# Patient Record
Sex: Female | Born: 1935 | Race: White | Hispanic: No | Smoking: Never smoker
Health system: Southern US, Community
[De-identification: ages and names within clinical notes are randomized; demographics above are authoritative.]

## PROBLEM LIST (undated history)

## (undated) DIAGNOSIS — E559 Vitamin D deficiency, unspecified: Secondary | ICD-10-CM

## (undated) DIAGNOSIS — E039 Hypothyroidism, unspecified: Secondary | ICD-10-CM

## (undated) DIAGNOSIS — L309 Dermatitis, unspecified: Secondary | ICD-10-CM

## (undated) HISTORY — DX: Hypothyroidism, unspecified: E03.9

## (undated) HISTORY — PX: TONSILLECTOMY: SUR1361

---

## 2008-08-20 ENCOUNTER — Other Ambulatory Visit: Admission: RE | Admit: 2008-08-20 | Discharge: 2008-08-20 | Payer: Self-pay | Admitting: Family Medicine

## 2011-02-23 ENCOUNTER — Encounter: Payer: Self-pay | Admitting: Internal Medicine

## 2012-04-03 DIAGNOSIS — Z136 Encounter for screening for cardiovascular disorders: Secondary | ICD-10-CM | POA: Diagnosis not present

## 2012-04-03 DIAGNOSIS — Z0000 Encounter for general adult medical examination without abnormal findings: Secondary | ICD-10-CM | POA: Diagnosis not present

## 2012-04-03 DIAGNOSIS — Z9181 History of falling: Secondary | ICD-10-CM | POA: Diagnosis not present

## 2012-04-03 DIAGNOSIS — E039 Hypothyroidism, unspecified: Secondary | ICD-10-CM | POA: Diagnosis not present

## 2012-05-15 DIAGNOSIS — H02409 Unspecified ptosis of unspecified eyelid: Secondary | ICD-10-CM | POA: Diagnosis not present

## 2012-05-15 DIAGNOSIS — Z961 Presence of intraocular lens: Secondary | ICD-10-CM | POA: Diagnosis not present

## 2012-05-15 DIAGNOSIS — H0230 Blepharochalasis unspecified eye, unspecified eyelid: Secondary | ICD-10-CM | POA: Diagnosis not present

## 2012-05-15 DIAGNOSIS — H53039 Strabismic amblyopia, unspecified eye: Secondary | ICD-10-CM | POA: Diagnosis not present

## 2012-07-20 DIAGNOSIS — Z23 Encounter for immunization: Secondary | ICD-10-CM | POA: Diagnosis not present

## 2012-10-07 DIAGNOSIS — L259 Unspecified contact dermatitis, unspecified cause: Secondary | ICD-10-CM | POA: Diagnosis not present

## 2012-10-07 DIAGNOSIS — E039 Hypothyroidism, unspecified: Secondary | ICD-10-CM | POA: Diagnosis not present

## 2013-04-07 DIAGNOSIS — N951 Menopausal and female climacteric states: Secondary | ICD-10-CM | POA: Diagnosis not present

## 2013-04-07 DIAGNOSIS — Z0000 Encounter for general adult medical examination without abnormal findings: Secondary | ICD-10-CM | POA: Diagnosis not present

## 2013-04-07 DIAGNOSIS — E039 Hypothyroidism, unspecified: Secondary | ICD-10-CM | POA: Diagnosis not present

## 2013-04-07 DIAGNOSIS — Z136 Encounter for screening for cardiovascular disorders: Secondary | ICD-10-CM | POA: Diagnosis not present

## 2013-04-07 DIAGNOSIS — E559 Vitamin D deficiency, unspecified: Secondary | ICD-10-CM | POA: Diagnosis not present

## 2013-04-30 DIAGNOSIS — Z1231 Encounter for screening mammogram for malignant neoplasm of breast: Secondary | ICD-10-CM | POA: Diagnosis not present

## 2013-04-30 DIAGNOSIS — M899 Disorder of bone, unspecified: Secondary | ICD-10-CM | POA: Diagnosis not present

## 2013-06-20 ENCOUNTER — Emergency Department (HOSPITAL_BASED_OUTPATIENT_CLINIC_OR_DEPARTMENT_OTHER)
Admission: EM | Admit: 2013-06-20 | Discharge: 2013-06-20 | Disposition: A | Discharge status: Home / Self Care | Payer: Medicare Other | Attending: Emergency Medicine | Admitting: Emergency Medicine

## 2013-06-20 ENCOUNTER — Emergency Department (HOSPITAL_BASED_OUTPATIENT_CLINIC_OR_DEPARTMENT_OTHER): Payer: Medicare Other

## 2013-06-20 ENCOUNTER — Encounter (HOSPITAL_BASED_OUTPATIENT_CLINIC_OR_DEPARTMENT_OTHER): Payer: Self-pay | Admitting: Emergency Medicine

## 2013-06-20 DIAGNOSIS — Z8639 Personal history of other endocrine, nutritional and metabolic disease: Secondary | ICD-10-CM | POA: Insufficient documentation

## 2013-06-20 DIAGNOSIS — Z872 Personal history of diseases of the skin and subcutaneous tissue: Secondary | ICD-10-CM | POA: Diagnosis not present

## 2013-06-20 DIAGNOSIS — R1033 Periumbilical pain: Secondary | ICD-10-CM | POA: Diagnosis not present

## 2013-06-20 DIAGNOSIS — R109 Unspecified abdominal pain: Secondary | ICD-10-CM | POA: Diagnosis not present

## 2013-06-20 DIAGNOSIS — Z862 Personal history of diseases of the blood and blood-forming organs and certain disorders involving the immune mechanism: Secondary | ICD-10-CM | POA: Insufficient documentation

## 2013-06-20 HISTORY — DX: Vitamin D deficiency, unspecified: E55.9

## 2013-06-20 HISTORY — DX: Hypothyroidism, unspecified: E03.9

## 2013-06-20 HISTORY — DX: Dermatitis, unspecified: L30.9

## 2013-06-20 LAB — CBC WITH DIFFERENTIAL/PLATELET
Basophils Absolute: 0 10*3/uL (ref 0.0–0.1)
Basophils Relative: 0 % (ref 0–1)
Eosinophils Absolute: 0 10*3/uL (ref 0.0–0.7)
HCT: 46.6 % — ABNORMAL HIGH (ref 36.0–46.0)
Hemoglobin: 15.8 g/dL — ABNORMAL HIGH (ref 12.0–15.0)
Lymphocytes Relative: 7 % — ABNORMAL LOW (ref 12–46)
MCH: 32.7 pg (ref 26.0–34.0)
Monocytes Absolute: 0.7 10*3/uL (ref 0.1–1.0)
Neutro Abs: 12.9 10*3/uL — ABNORMAL HIGH (ref 1.7–7.7)
RDW: 13 % (ref 11.5–15.5)
WBC: 14.6 10*3/uL — ABNORMAL HIGH (ref 4.0–10.5)

## 2013-06-20 LAB — URINALYSIS, ROUTINE W REFLEX MICROSCOPIC
Bilirubin Urine: NEGATIVE
Glucose, UA: 100 mg/dL — AB
Hgb urine dipstick: NEGATIVE
Ketones, ur: 15 mg/dL — AB
Nitrite: NEGATIVE
Protein, ur: NEGATIVE mg/dL

## 2013-06-20 LAB — COMPREHENSIVE METABOLIC PANEL
ALT: 11 U/L (ref 0–35)
AST: 19 U/L (ref 0–37)
Alkaline Phosphatase: 70 U/L (ref 39–117)
CO2: 24 mEq/L (ref 19–32)
Chloride: 98 mEq/L (ref 96–112)
Creatinine, Ser: 0.8 mg/dL (ref 0.50–1.10)
GFR calc non Af Amer: 65 mL/min — ABNORMAL LOW (ref >90)
Total Bilirubin: 0.7 mg/dL (ref 0.3–1.2)
Total Protein: 7.7 g/dL (ref 6.0–8.3)

## 2013-06-20 LAB — URINE MICROSCOPIC-ADD ON

## 2013-06-20 MED ORDER — IOHEXOL 300 MG/ML  SOLN
100.0000 mL | Freq: Once | INTRAMUSCULAR | Status: AC | PRN
  Administered 2013-06-20: 100 mL via INTRAVENOUS

## 2013-06-20 MED ORDER — IOHEXOL 300 MG/ML  SOLN
50.0000 mL | Freq: Once | INTRAMUSCULAR | Status: AC | PRN
  Administered 2013-06-20: 50 mL via ORAL

## 2013-06-20 MED ORDER — HYDROCODONE-ACETAMINOPHEN 5-325 MG PO TABS: 1.0000 | ORAL_TABLET | ORAL | Status: DC | PRN

## 2013-06-20 MED ORDER — SODIUM CHLORIDE 0.9 % IV BOLUS (SEPSIS)
500.0000 mL | Freq: Once | INTRAVENOUS | Status: AC
  Administered 2013-06-20: 500 mL via INTRAVENOUS

## 2013-06-20 NOTE — ED Notes (Addendum)
Patient states that she started having abdominal cramps around 11am this morning.  Patient is from [facility].  Patient was sent to [facility] and took X-rays and medications.  Patient was then sent here for CT scan of abdomen.  Patient was nauseated and vomited today.  Patient was given 60mg  of Toradol and 25mg  phenergan IM PTA

## 2013-06-20 NOTE — ED Notes (Signed)
Patient transported to CT via stretcher per tech. 

## 2013-06-20 NOTE — ED Provider Notes (Signed)
CSN: [id number]     Arrival date & time 06/20/13  1707 History   First MD Initiated Contact with Patient 06/20/13 1750    Scribed for Geoffery Lyons, MD, the patient was seen in room MH04/MH04. This chart was scribed by Lewanda Rife, ED scribe. Patient's care was started at 6:06 PM   Chief Complaint  Patient presents with  . Abdominal Pain   (Consider location/radiation/quality/duration/timing/severity/associated sxs/prior Treatment) The history is provided by the patient and medical records. No language interpreter was used.   HPI Comments: Lisa Barrera is a 79 y.o. female who presents to the Emergency Department complaining of constant moderate central abdominal pain onset this morning. Describes pain as focal. Reports associated emesis. Denies any aggravating or alleviating factors. Denies associated fever, hematemesis, diarrhea, dysuria, burning with urination, and decreased appetite. Reports last meal was this morning. Denies hx of the same. Denies abdominal surgical hx. Denies other pertinent PMHx.    Past Medical History  Diagnosis Date  . Hypothyroid   . Eczema   . Vitamin D deficiency    Past Surgical History  Procedure Laterality Date  . Tonsillectomy     History reviewed. No pertinent family history. History  Substance Use Topics  . Smoking status: Never Smoker   . Smokeless tobacco: Not on file  . Alcohol Use: Yes   OB History   Grav Para Term Preterm Abortions TAB SAB Ect Mult Living                 Review of Systems  Constitutional: Negative for fever.  Gastrointestinal: Positive for vomiting and abdominal pain. Negative for diarrhea.  All other systems reviewed and are negative.   A complete 10 system review of systems was obtained and all systems are negative except as noted in the HPI and PMHx.    Allergies  Review of patient's allergies indicates no known allergies.  Home Medications  No current outpatient prescriptions on file. BP 179/81   Pulse 58  Temp(Src) 97.7 F (36.5 C) (Oral)  Resp 20  Ht 5\' 2"  (1.575 m)  Wt 150 lb (68.04 kg)  BMI 27.43 kg/m2  SpO2 96% Physical Exam  Nursing note and vitals reviewed. Constitutional: She is oriented to person, place, and time. She appears well-developed and well-nourished. No distress.  HENT:  Head: Normocephalic and atraumatic.  Mouth/Throat: Oropharynx is clear and moist. No oropharyngeal exudate.  Eyes: Conjunctivae and EOM are normal. No scleral icterus.  Neck: Neck supple. No tracheal deviation present.  Cardiovascular: Normal rate and regular rhythm.   No murmur heard. Pulmonary/Chest: Effort normal and breath sounds normal. No respiratory distress.  Abdominal: Soft. Bowel sounds are normal. There is tenderness. There is no rebound and no guarding.  TTP below umbilicus with no rebound or guarding   Musculoskeletal: Normal range of motion.  Neurological: She is alert and oriented to person, place, and time.  Skin: Skin is warm and dry.  Psychiatric: She has a normal mood and affect. Her behavior is normal.    ED Course  Procedures (including critical care time) DIAGNOSTIC STUDIES: Oxygen Saturation is 96% on room air, normal by my interpretation.    COORDINATION OF CARE:  Nursing notes reviewed. Vital signs reviewed. Initial pt interview and examination performed.  Treatment plan initiated: Medications  iohexol (OMNIPAQUE) 300 MG/ML solution 50 mL (not administered)  sodium chloride 0.9 % bolus 500 mL (500 mLs Intravenous New Bag/Given 06/20/13 1758)    6:10 PM-Discussed treatment plan with pt at bedside, which  includes CT of abdomen, CBC with diff panel, CMP, Lipase, and UA . Pt agrees with plan.  Initial diagnostic testing ordered.    8:19 PM Nursing Notes Reviewed/ Care Coordinated Applicable Imaging Reviewed  Interpretation of Laboratory Data incorporated into ED treatment Discussed results and treatment plan with pt. Pt demonstrates understanding and  agrees with plan.   Labs Review Labs Reviewed  CBC WITH DIFFERENTIAL - Abnormal; Notable for the following:    WBC 14.6 (*)    Hemoglobin 15.8 (*)    HCT 46.6 (*)    Neutrophils Relative % 89 (*)    Neutro Abs 12.9 (*)    Lymphocytes Relative 7 (*)    All other components within normal limits  COMPREHENSIVE METABOLIC PANEL - Abnormal; Notable for the following:    Glucose, Bld 188 (*)    GFR calc non Af Amer 65 (*)    GFR calc Af Amer 76 (*)    All other components within normal limits  URINALYSIS, ROUTINE W REFLEX MICROSCOPIC - Abnormal; Notable for the following:    Specific Gravity, Urine 1.034 (*)    Glucose, UA 100 (*)    Ketones, ur 15 (*)    Leukocytes, UA SMALL (*)    All other components within normal limits  LIPASE, BLOOD  URINE MICROSCOPIC-ADD ON   Imaging Review Ct Abdomen Pelvis W Contrast  06/20/2013   CLINICAL DATA:  Abdominal pain.  EXAM: CT ABDOMEN AND PELVIS WITH CONTRAST  TECHNIQUE: Multidetector CT imaging of the abdomen and pelvis was performed using the standard protocol following bolus administration of intravenous contrast.  CONTRAST:  OMNIPAQUE IOHEXOL 300 MG/ML SOLN, 50mL OMNIPAQUE IOHEXOL 300 MG/ML SOLN  COMPARISON:  None.  FINDINGS: Visualized lung bases appear normal. The liver, spleen and pancreas appear normal. No gallstones are noted. Adrenal glands appear normal. Kidneys demonstrate a horseshoe configuration without hydronephrosis or renal obstruction. No evidence of bowel obstruction is noted. Urinary bladder appears normal. Uterus appears normal. No abnormal fluid collection is noted.  IMPRESSION: No acute abnormality seen in the abdomen or pelvis.   Electronically Signed   By: Roque Lias M.D.   On: 06/20/2013 19:41    EKG Interpretation   None       MDM  No diagnosis found. Patient is an 79 year old female with extremely little medical history and no surgical history. She presents with complaints of abdominal pain is located  primarily just inferior to the umbilicus. She has had this cramping off and on for the past couple of days. She has had no fevers and no chills. She was seen by her primary Dr. and was given a pain shot and sent here for further workup. Laboratory studies reveal a white count of 14.5 but are otherwise unremarkable. CT scan was obtained which reveals no acute intra-abdominal process. There is no evidence for appendicitis, cholecystitis, bowel obstruction or other acutely surgical pathology. At this point I feel as though she is stable for discharge. I will prescribe a small number of pain pills which he can take if her pain returns. She was advised to return for severe abdominal pain, high fever, bloody stool, or she develops other new or bothersome symptoms that she would like rechecked. I personally performed the services described in this documentation, which was scribed in my presence. The recorded information has been reviewed and is accurate.       Geoffery Lyons, MD 06/20/13 2023

## 2013-06-20 NOTE — ED Notes (Signed)
Pt and family updated.  Pt ambulated to bathroom.  Denies nausea or significant pain.

## 2013-06-23 DIAGNOSIS — R109 Unspecified abdominal pain: Secondary | ICD-10-CM | POA: Diagnosis not present

## 2013-06-24 ENCOUNTER — Encounter (HOSPITAL_COMMUNITY): Payer: Medicare Other | Admitting: Anesthesiology

## 2013-06-24 ENCOUNTER — Inpatient Hospital Stay (HOSPITAL_COMMUNITY): Payer: Medicare Other | Admitting: Anesthesiology

## 2013-06-24 ENCOUNTER — Emergency Department (HOSPITAL_COMMUNITY): Payer: Medicare Other

## 2013-06-24 ENCOUNTER — Inpatient Hospital Stay (HOSPITAL_COMMUNITY): Payer: Medicare Other

## 2013-06-24 ENCOUNTER — Encounter (HOSPITAL_COMMUNITY): Admission: EM | Disposition: A | Discharge status: Home / Self Care | Payer: Self-pay | Source: Home / Self Care

## 2013-06-24 ENCOUNTER — Inpatient Hospital Stay (HOSPITAL_COMMUNITY)
Admission: EM | Admit: 2013-06-24 | Discharge: 2013-06-25 | DRG: 419 | Disposition: A | Discharge status: Home / Self Care | Payer: Medicare Other | Attending: General Surgery | Admitting: General Surgery

## 2013-06-24 ENCOUNTER — Encounter (HOSPITAL_COMMUNITY): Payer: Self-pay | Admitting: Emergency Medicine

## 2013-06-24 DIAGNOSIS — K8020 Calculus of gallbladder without cholecystitis without obstruction: Secondary | ICD-10-CM | POA: Diagnosis not present

## 2013-06-24 DIAGNOSIS — K8000 Calculus of gallbladder with acute cholecystitis without obstruction: Secondary | ICD-10-CM | POA: Diagnosis not present

## 2013-06-24 DIAGNOSIS — N390 Urinary tract infection, site not specified: Secondary | ICD-10-CM

## 2013-06-24 DIAGNOSIS — D72829 Elevated white blood cell count, unspecified: Secondary | ICD-10-CM | POA: Diagnosis not present

## 2013-06-24 DIAGNOSIS — R109 Unspecified abdominal pain: Secondary | ICD-10-CM | POA: Diagnosis not present

## 2013-06-24 DIAGNOSIS — E871 Hypo-osmolality and hyponatremia: Secondary | ICD-10-CM | POA: Diagnosis not present

## 2013-06-24 DIAGNOSIS — K810 Acute cholecystitis: Secondary | ICD-10-CM

## 2013-06-24 DIAGNOSIS — K8066 Calculus of gallbladder and bile duct with acute and chronic cholecystitis without obstruction: Secondary | ICD-10-CM | POA: Diagnosis not present

## 2013-06-24 DIAGNOSIS — E039 Hypothyroidism, unspecified: Secondary | ICD-10-CM | POA: Diagnosis present

## 2013-06-24 DIAGNOSIS — K8010 Calculus of gallbladder with chronic cholecystitis without obstruction: Secondary | ICD-10-CM | POA: Diagnosis not present

## 2013-06-24 DIAGNOSIS — J984 Other disorders of lung: Secondary | ICD-10-CM | POA: Diagnosis not present

## 2013-06-24 HISTORY — PX: CHOLECYSTECTOMY: SHX55

## 2013-06-24 LAB — COMPREHENSIVE METABOLIC PANEL
AST: 18 U/L (ref 0–37)
Albumin: 2.7 g/dL — ABNORMAL LOW (ref 3.5–5.2)
BUN: 17 mg/dL (ref 6–23)
CO2: 23 mEq/L (ref 19–32)
Calcium: 9.1 mg/dL (ref 8.4–10.5)
Chloride: 92 mEq/L — ABNORMAL LOW (ref 96–112)
Creatinine, Ser: 0.7 mg/dL (ref 0.50–1.10)
Total Bilirubin: 1.7 mg/dL — ABNORMAL HIGH (ref 0.3–1.2)
Total Protein: 6.8 g/dL (ref 6.0–8.3)

## 2013-06-24 LAB — CBC WITH DIFFERENTIAL/PLATELET
Basophils Absolute: 0 10*3/uL (ref 0.0–0.1)
Basophils Relative: 0 % (ref 0–1)
Eosinophils Absolute: 0 10*3/uL (ref 0.0–0.7)
Eosinophils Relative: 0 % (ref 0–5)
HCT: 39.9 % (ref 36.0–46.0)
Lymphs Abs: 0.8 10*3/uL (ref 0.7–4.0)
MCH: 32.7 pg (ref 26.0–34.0)
MCHC: 34.8 g/dL (ref 30.0–36.0)
Monocytes Absolute: 2.2 10*3/uL — ABNORMAL HIGH (ref 0.1–1.0)
Neutro Abs: 15.6 10*3/uL — ABNORMAL HIGH (ref 1.7–7.7)
Neutrophils Relative %: 84 % — ABNORMAL HIGH (ref 43–77)
Platelets: 180 10*3/uL (ref 150–400)
RDW: 13.5 % (ref 11.5–15.5)
WBC: 18.6 10*3/uL — ABNORMAL HIGH (ref 4.0–10.5)

## 2013-06-24 LAB — URINALYSIS W MICROSCOPIC + REFLEX CULTURE
Glucose, UA: NEGATIVE mg/dL
Nitrite: NEGATIVE
Protein, ur: 30 mg/dL — AB
Specific Gravity, Urine: 1.022 (ref 1.005–1.030)
pH: 6 (ref 5.0–8.0)

## 2013-06-24 LAB — LIPASE, BLOOD: Lipase: 13 U/L (ref 11–59)

## 2013-06-24 LAB — SURGICAL PCR SCREEN: MRSA, PCR: POSITIVE — AB

## 2013-06-24 LAB — CG4 I-STAT (LACTIC ACID): Lactic Acid, Venous: 1.27 mmol/L (ref 0.5–2.2)

## 2013-06-24 SURGERY — LAPAROSCOPIC CHOLECYSTECTOMY WITH INTRAOPERATIVE CHOLANGIOGRAM
Anesthesia: General | Site: Abdomen | Wound class: Contaminated

## 2013-06-24 MED ORDER — ONDANSETRON HCL 4 MG/2ML IJ SOLN: 4.0000 mg | Freq: Four times a day (QID) | INTRAMUSCULAR | Status: DC | PRN

## 2013-06-24 MED ORDER — IOHEXOL 300 MG/ML  SOLN
INTRAMUSCULAR | Status: DC | PRN
  Administered 2013-06-24: 13 mL via INTRAVENOUS

## 2013-06-24 MED ORDER — ONDANSETRON HCL 4 MG/2ML IJ SOLN
INTRAMUSCULAR | Status: DC | PRN
  Administered 2013-06-24: 4 mg via INTRAMUSCULAR

## 2013-06-24 MED ORDER — KCL IN DEXTROSE-NACL 20-5-0.9 MEQ/L-%-% IV SOLN
INTRAVENOUS | Status: DC
  Administered 2013-06-24: 21:00:00 via INTRAVENOUS
  Filled 2013-06-24 (×2): qty 1000

## 2013-06-24 MED ORDER — LIDOCAINE HCL (CARDIAC) 20 MG/ML IV SOLN
INTRAVENOUS | Status: DC | PRN
  Administered 2013-06-24: 60 mg via INTRAVENOUS

## 2013-06-24 MED ORDER — ROCURONIUM BROMIDE 100 MG/10ML IV SOLN
INTRAVENOUS | Status: DC | PRN
  Administered 2013-06-24: 40 mg via INTRAVENOUS
  Administered 2013-06-24 (×2): 10 mg via INTRAVENOUS

## 2013-06-24 MED ORDER — SODIUM CHLORIDE 0.9 % IR SOLN
Status: DC | PRN
  Administered 2013-06-24: 3000 mL

## 2013-06-24 MED ORDER — SODIUM CHLORIDE 0.9 % IR SOLN
Status: DC | PRN
  Administered 2013-06-24: 1000 mL

## 2013-06-24 MED ORDER — BUPIVACAINE-EPINEPHRINE PF 0.25-1:200000 % IJ SOLN
INTRAMUSCULAR | Status: AC
  Filled 2013-06-24: qty 30

## 2013-06-24 MED ORDER — DIPHENHYDRAMINE HCL 50 MG/ML IJ SOLN: 12.5000 mg | Freq: Four times a day (QID) | INTRAMUSCULAR | Status: DC | PRN

## 2013-06-24 MED ORDER — OXYCODONE-ACETAMINOPHEN 5-325 MG PO TABS: 1.0000 | ORAL_TABLET | ORAL | Status: DC | PRN

## 2013-06-24 MED ORDER — PNEUMOCOCCAL VAC POLYVALENT 25 MCG/0.5ML IJ INJ
0.5000 mL | INJECTION | INTRAMUSCULAR | Status: AC
  Administered 2013-06-25: 0.5 mL via INTRAMUSCULAR
  Filled 2013-06-24: qty 0.5

## 2013-06-24 MED ORDER — LABETALOL HCL 5 MG/ML IV SOLN
INTRAVENOUS | Status: DC | PRN
  Administered 2013-06-24: 2.5 mg via INTRAVENOUS
  Administered 2013-06-24: 5 mg via INTRAVENOUS

## 2013-06-24 MED ORDER — ONDANSETRON HCL 4 MG PO TABS: 4.0000 mg | ORAL_TABLET | Freq: Four times a day (QID) | ORAL | Status: DC | PRN

## 2013-06-24 MED ORDER — DEXAMETHASONE SODIUM PHOSPHATE 4 MG/ML IJ SOLN
INTRAMUSCULAR | Status: DC | PRN
  Administered 2013-06-24: 4 mg via INTRAVENOUS

## 2013-06-24 MED ORDER — SODIUM CHLORIDE 0.9 % IV SOLN
1.0000 g | Freq: Once | INTRAVENOUS | Status: AC
  Administered 2013-06-24: 1 g via INTRAVENOUS
  Filled 2013-06-24: qty 1

## 2013-06-24 MED ORDER — SODIUM CHLORIDE 0.9 % IV SOLN
INTRAVENOUS | Status: DC
  Administered 2013-06-24: 08:00:00 via INTRAVENOUS

## 2013-06-24 MED ORDER — BUPIVACAINE-EPINEPHRINE 0.25% -1:200000 IJ SOLN
INTRAMUSCULAR | Status: DC | PRN
  Administered 2013-06-24: 8 mL

## 2013-06-24 MED ORDER — DIPHENHYDRAMINE HCL 12.5 MG/5ML PO ELIX: 12.5000 mg | ORAL_SOLUTION | Freq: Four times a day (QID) | ORAL | Status: DC | PRN

## 2013-06-24 MED ORDER — GLYCOPYRROLATE 0.2 MG/ML IJ SOLN
INTRAMUSCULAR | Status: DC | PRN
  Administered 2013-06-24: 0.6 mg via INTRAVENOUS

## 2013-06-24 MED ORDER — INFLUENZA VAC SPLIT QUAD 0.5 ML IM SUSP
0.5000 mL | INTRAMUSCULAR | Status: AC
  Administered 2013-06-25: 0.5 mL via INTRAMUSCULAR
  Filled 2013-06-24: qty 0.5

## 2013-06-24 MED ORDER — ACETAMINOPHEN 325 MG PO TABS: 650.0000 mg | ORAL_TABLET | Freq: Four times a day (QID) | ORAL | Status: DC | PRN

## 2013-06-24 MED ORDER — HYDROMORPHONE HCL PF 1 MG/ML IJ SOLN
1.0000 mg | INTRAMUSCULAR | Status: DC | PRN
Start: 2013-06-24 — End: 2013-06-25

## 2013-06-24 MED ORDER — FENTANYL CITRATE 0.05 MG/ML IJ SOLN
INTRAMUSCULAR | Status: DC | PRN
  Administered 2013-06-24: 100 ug via INTRAVENOUS
  Administered 2013-06-24: 50 ug via INTRAVENOUS
  Administered 2013-06-24: 100 ug via INTRAVENOUS

## 2013-06-24 MED ORDER — NEOSTIGMINE METHYLSULFATE 1 MG/ML IJ SOLN
INTRAMUSCULAR | Status: DC | PRN
  Administered 2013-06-24: 3 mg via INTRAVENOUS

## 2013-06-24 MED ORDER — HYDROCODONE-ACETAMINOPHEN 5-325 MG PO TABS
1.0000 | ORAL_TABLET | ORAL | Status: DC | PRN
  Filled 2013-06-24: qty 1

## 2013-06-24 MED ORDER — MORPHINE SULFATE 2 MG/ML IJ SOLN: 2.0000 mg | INTRAMUSCULAR | Status: DC | PRN

## 2013-06-24 MED ORDER — HYDROMORPHONE HCL PF 1 MG/ML IJ SOLN
INTRAMUSCULAR | Status: DC | PRN
  Administered 2013-06-24: 1 mg via INTRAVENOUS

## 2013-06-24 MED ORDER — KCL IN DEXTROSE-NACL 20-5-0.9 MEQ/L-%-% IV SOLN
INTRAVENOUS | Status: DC
  Filled 2013-06-24 (×2): qty 1000

## 2013-06-24 MED ORDER — HYDROMORPHONE HCL PF 1 MG/ML IJ SOLN: 0.2500 mg | INTRAMUSCULAR | Status: DC | PRN

## 2013-06-24 MED ORDER — ACETAMINOPHEN 650 MG RE SUPP: 650.0000 mg | Freq: Four times a day (QID) | RECTAL | Status: DC | PRN

## 2013-06-24 MED ORDER — ENOXAPARIN SODIUM 40 MG/0.4ML ~~LOC~~ SOLN: 40.0000 mg | SUBCUTANEOUS | Status: DC

## 2013-06-24 MED ORDER — LACTATED RINGERS IV SOLN
INTRAVENOUS | Status: DC
  Administered 2013-06-24 (×2): via INTRAVENOUS

## 2013-06-24 MED ORDER — PROPOFOL 10 MG/ML IV BOLUS
INTRAVENOUS | Status: DC | PRN
  Administered 2013-06-24: 50 mg via INTRAVENOUS
  Administered 2013-06-24: 130 mg via INTRAVENOUS

## 2013-06-24 SURGICAL SUPPLY — 40 items
APPLIER CLIP 5 13 M/L LIGAMAX5 (MISCELLANEOUS) ×2
BAG SPEC RTRVL LRG 6X4 10 (ENDOMECHANICALS) ×2
BENZOIN TINCTURE PRP APPL 2/3 (GAUZE/BANDAGES/DRESSINGS) ×2 IMPLANT
CANISTER SUCTION 2500CC (MISCELLANEOUS) ×2 IMPLANT
CHLORAPREP W/TINT 26ML (MISCELLANEOUS) ×2 IMPLANT
CLIP APPLIE 5 13 M/L LIGAMAX5 (MISCELLANEOUS) ×1 IMPLANT
CLIP LIGATING HEMO O LOK GREEN (MISCELLANEOUS) ×4 IMPLANT
CLOTH BEACON ORANGE TIMEOUT ST (SAFETY) ×2 IMPLANT
COVER MAYO STAND STRL (DRAPES) ×2 IMPLANT
COVER SURGICAL LIGHT HANDLE (MISCELLANEOUS) ×2 IMPLANT
COVER TRANSDUCER ULTRASND (DRAPES) ×2 IMPLANT
DEVICE TROCAR PUNCTURE CLOSURE (ENDOMECHANICALS) ×2 IMPLANT
DRAPE C-ARM 42X72 X-RAY (DRAPES) ×2 IMPLANT
DRAPE UTILITY 15X26 W/TAPE STR (DRAPE) ×4 IMPLANT
ELECT REM PT RETURN 9FT ADLT (ELECTROSURGICAL) ×2
ELECTRODE REM PT RTRN 9FT ADLT (ELECTROSURGICAL) ×1 IMPLANT
GAUZE SPONGE 2X2 8PLY STRL LF (GAUZE/BANDAGES/DRESSINGS) ×1 IMPLANT
GLOVE BIO SURGEON STRL SZ7.5 (GLOVE) ×2 IMPLANT
GOWN STRL NON-REIN LRG LVL3 (GOWN DISPOSABLE) ×6 IMPLANT
GOWN STRL REIN XL XLG (GOWN DISPOSABLE) ×2 IMPLANT
IV CATH 14GX2 1/4 (CATHETERS) ×2 IMPLANT
KIT BASIN OR (CUSTOM PROCEDURE TRAY) ×2 IMPLANT
KIT ROOM TURNOVER OR (KITS) ×2 IMPLANT
NEEDLE INSUFFLATION 14GA 120MM (NEEDLE) ×2 IMPLANT
NS IRRIG 1000ML POUR BTL (IV SOLUTION) ×2 IMPLANT
PAD ARMBOARD 7.5X6 YLW CONV (MISCELLANEOUS) ×4 IMPLANT
POUCH SPECIMEN RETRIEVAL 10MM (ENDOMECHANICALS) ×4 IMPLANT
SCISSORS LAP 5X35 DISP (ENDOMECHANICALS) ×2 IMPLANT
SET CHOLANGIOGRAPHY FRANKLIN (SET/KITS/TRAYS/PACK) ×2 IMPLANT
SET IRRIG TUBING LAPAROSCOPIC (IRRIGATION / IRRIGATOR) ×2 IMPLANT
SLEEVE ENDOPATH XCEL 5M (ENDOMECHANICALS) ×4 IMPLANT
SPECIMEN JAR SMALL (MISCELLANEOUS) ×2 IMPLANT
SPONGE GAUZE 2X2 STER 10/PKG (GAUZE/BANDAGES/DRESSINGS) ×1
SUT MNCRL AB 3-0 PS2 18 (SUTURE) ×2 IMPLANT
SUT VIC AB 1 BRD 54 (SUTURE) ×2 IMPLANT
TOWEL OR 17X24 6PK STRL BLUE (TOWEL DISPOSABLE) ×2 IMPLANT
TOWEL OR 17X26 10 PK STRL BLUE (TOWEL DISPOSABLE) ×2 IMPLANT
TRAY LAPAROSCOPIC (CUSTOM PROCEDURE TRAY) ×2 IMPLANT
TROCAR XCEL NON-BLD 11X100MML (ENDOMECHANICALS) ×2 IMPLANT
TROCAR XCEL NON-BLD 5MMX100MML (ENDOMECHANICALS) ×2 IMPLANT

## 2013-06-24 NOTE — Preoperative (Signed)
Beta Blockers   Reason not to administer Beta Blockers:Not Applicable 

## 2013-06-24 NOTE — Transfer of Care (Signed)
Immediate Anesthesia Transfer of Care Note  Patient: Lisa Barrera  Procedure(s) Performed: Procedure(s): LAPAROSCOPIC CHOLECYSTECTOMY WITH INTRAOPERATIVE CHOLANGIOGRAM (N/A)  Patient Location: PACU  Anesthesia Type:General  Level of Consciousness: awake  Airway & Oxygen Therapy: Patient Spontanous Breathing and Patient placed on Ventilator (see vital sign flow sheet for setting)  Post-op Assessment: Report given to PACU RN and Post -op Vital signs reviewed and stable  Post vital signs: stable  Complications: Patient re-intubated

## 2013-06-24 NOTE — OR Nursing (Signed)
Son, Lisa Barrera, at bedisde, pt began smiling and seems relaxed,n still will not acknowledge she had surgery

## 2013-06-24 NOTE — OR Nursing (Signed)
Patient vitals required re-intubation of the patient following initial extubation for laparoscopic cholecystectomy with intraoperative cholangiogram by Axel Filler, MD. The post anesthesia care unit was notified of this status change.  Oralia Manis, RN

## 2013-06-24 NOTE — ED Notes (Signed)
Pt states that she does not need to use the restroom at this time. Pt notified that urine sample is needed.

## 2013-06-24 NOTE — ED Provider Notes (Signed)
CSN: [id number]     Arrival date & time 06/24/13  0719 History   First MD Initiated Contact with Patient 06/24/13 [phone]     Chief Complaint  Patient presents with  . Abdominal Pain    HPI Pt was seen at 0735.  Per pt, c/o gradual onset and persistence of constant right sided abd "pain" for the past 4 days.  Has been associated with one episode of N/V 4 days ago.  Describes the abd pain as "cramping."  Pt was evaluated by her PMD x2 as well as in the ED x1 for same. States her PMD told her she "felt that the CT scan didn't show my appendix" and she was sent back to the ED for re-eval. Denies any further N/V, no diarrhea, no fevers, no back pain, no rash, no CP/SOB, no black or blood in stools or emesis.       Past Medical History  Diagnosis Date  . Hypothyroid   . Eczema   . Vitamin D deficiency    Past Surgical History  Procedure Laterality Date  . Tonsillectomy      History  Substance Use Topics  . Smoking status: Never Smoker   . Smokeless tobacco: Not on file  . Alcohol Use: Yes    Review of Systems ROS: Statement: All systems negative except as marked or noted in the HPI; Constitutional: Negative for fever and chills. ; ; Eyes: Negative for eye pain, redness and discharge. ; ; ENMT: Negative for ear pain, hoarseness, nasal congestion, sinus pressure and sore throat. ; ; Cardiovascular: Negative for chest pain, palpitations, diaphoresis, dyspnea and peripheral edema. ; ; Respiratory: Negative for cough, wheezing and stridor. ; ; Gastrointestinal: +N/V, abd pain. Negative for diarrhea, blood in stool, hematemesis, jaundice and rectal bleeding. . ; ; Genitourinary: Negative for dysuria, flank pain and hematuria. ; ; Musculoskeletal: Negative for back pain and neck pain. Negative for swelling and trauma.; ; Skin: Negative for pruritus, rash, abrasions, blisters, bruising and skin lesion.; ; Neuro: Negative for headache, lightheadedness and neck stiffness. Negative for weakness, altered  level of consciousness , altered mental status, extremity weakness, paresthesias, involuntary movement, seizure and syncope.      Allergies  Review of patient's allergies indicates no known allergies.  Home Medications   Current Outpatient Rx  Name  Route  Sig  Dispense  Refill  . HYDROcodone-acetaminophen (NORCO) 5-325 MG per tablet   Oral   Take 1 tablet by mouth every 4 (four) hours as needed for pain.   10 tablet   0    BP 114/95  Pulse 71  Temp(Src) 97.4 F (36.3 C) (Oral)  Resp 16  Wt 155 lb (70.308 kg)  BMI 28.34 kg/m2  SpO2 91% Physical Exam 0740: Physical examination:  Nursing notes reviewed; Vital signs and O2 SAT reviewed;  Constitutional: Well developed, Well nourished, Well hydrated, In no acute distress; Head:  Normocephalic, atraumatic; Eyes: EOMI, PERRL, No scleral icterus; ENMT: Mouth and pharynx normal, Mucous membranes moist; Neck: Supple, Full range of motion, No lymphadenopathy; Cardiovascular: Regular rate and rhythm, No gallop; Respiratory: Breath sounds clear & equal bilaterally, No rales, rhonchi, wheezes.  Speaking full sentences with ease, Normal respiratory effort/excursion; Chest: Nontender, Movement normal; Abdomen: Soft, +RUQ > RLQ tenderness to palp. No rebound or guarding. Nondistended, Normal bowel sounds; Genitourinary: No CVA tenderness; Extremities: Pulses normal, No tenderness, No edema, No calf edema or asymmetry.; Neuro: AA&Ox3, Major CN grossly intact.  Speech clear. Climbs on and off stretcher  easily by herself. Gait steady. No gross focal motor or sensory deficits in extremities.; Skin: Color normal, Warm, Dry.   ED Course  Procedures    EKG Interpretation   None       MDM  MDM Reviewed: previous chart, nursing note and vitals Reviewed previous: labs and CT scan Interpretation: labs, x-ray and ultrasound   Results for orders placed during the hospital encounter of 06/24/13  URINALYSIS W MICROSCOPIC + REFLEX CULTURE       Result Value Range   Color, Urine AMBER (*) YELLOW   APPearance CLOUDY (*) CLEAR   Specific Gravity, Urine 1.022  1.005 - 1.030   pH 6.0  5.0 - 8.0   Glucose, UA NEGATIVE  NEGATIVE mg/dL   Hgb urine dipstick MODERATE (*) NEGATIVE   Bilirubin Urine SMALL (*) NEGATIVE   Ketones, ur 15 (*) NEGATIVE mg/dL   Protein, ur 30 (*) NEGATIVE mg/dL   Urobilinogen, UA 1.0  0.0 - 1.0 mg/dL   Nitrite NEGATIVE  NEGATIVE   Leukocytes, UA MODERATE (*) NEGATIVE   WBC, UA 11-20  <3 WBC/hpf   RBC / HPF 7-10  <3 RBC/hpf   Bacteria, UA FEW (*) RARE   Squamous Epithelial / LPF MANY (*) RARE  CBC WITH DIFFERENTIAL      Result Value Range   WBC 18.6 (*) 4.0 - 10.5 K/uL   RBC 4.25  3.87 - 5.11 MIL/uL   Hemoglobin 13.9  12.0 - 15.0 g/dL   HCT 29.5  62.1 - 30.8 %   MCV 93.9  78.0 - 100.0 fL   MCH 32.7  26.0 - 34.0 pg   MCHC 34.8  30.0 - 36.0 g/dL   RDW 65.7  84.6 - 96.2 %   Platelets 180  150 - 400 K/uL   Neutrophils Relative % 84 (*) 43 - 77 %   Neutro Abs 15.6 (*) 1.7 - 7.7 K/uL   Lymphocytes Relative 4 (*) 12 - 46 %   Lymphs Abs 0.8  0.7 - 4.0 K/uL   Monocytes Relative 12  3 - 12 %   Monocytes Absolute 2.2 (*) 0.1 - 1.0 K/uL   Eosinophils Relative 0  0 - 5 %   Eosinophils Absolute 0.0  0.0 - 0.7 K/uL   Basophils Relative 0  0 - 1 %   Basophils Absolute 0.0  0.0 - 0.1 K/uL  COMPREHENSIVE METABOLIC PANEL      Result Value Range   Sodium 128 (*) 135 - 145 mEq/L   Potassium 3.6  3.5 - 5.1 mEq/L   Chloride 92 (*) 96 - 112 mEq/L   CO2 23  19 - 32 mEq/L   Glucose, Bld 114 (*) 70 - 99 mg/dL   BUN 17  6 - 23 mg/dL   Creatinine, Ser 9.52  0.50 - 1.10 mg/dL   Calcium 9.1  8.4 - 84.1 mg/dL   Total Protein 6.8  6.0 - 8.3 g/dL   Albumin 2.7 (*) 3.5 - 5.2 g/dL   AST 18  0 - 37 U/L   ALT 14  0 - 35 U/L   Alkaline Phosphatase 92  39 - 117 U/L   Total Bilirubin 1.7 (*) 0.3 - 1.2 mg/dL   GFR calc non Af Amer 77 (*) >90 mL/min   GFR calc Af Amer 89 (*) >90 mL/min  LIPASE, BLOOD      Result Value Range    Lipase 13  11 - 59 U/L  CG4 I-STAT (LACTIC ACID)  Result Value Range   Lactic Acid, Venous 1.27  0.5 - 2.2 mmol/L   US Abdomen Complete 06/24/2013   CLINICAL DATA:  Right-sided abdominal pain.  EXAM: ULTRASOUND ABDOMEN COMPLETE  COMPARISON:  CT 06/20/2013  FINDINGS: Gallbladder  Numerous layering gallstones within the gallbladder. Gallbladder wall is thickened at 4 mm. There is surrounding pericholecystic fluid. The patient was not tender over the gallbladder during the study. Gallbladder moderately distended.  Common bile duct  Diameter: Normal caliber, 5 mm.  Liver  Normal size and echotexture. No focal abnormality. No biliary ductal dilatation.  IVC  No abnormality visualized.  Pancreas  Visualized portion unremarkable.  Spleen  Size and appearance within normal limits.  Right Kidney  Length: 9.3 cm. Mild cortical thinning. Normal echotexture. No hydronephrosis.  Left Kidney  Length: 9.7 cm. Mild cortical thinning. Echogenicity within normal limits. No mass or hydronephrosis visualized.  Abdominal aorta  No aneurysm visualized.  IMPRESSION: Distended gallbladder with numerous layering stones, wall thickening and pericholecystic fluid. Despite the lack of sonographic Murphy sign, the appearance is worrisome for acute cholecystitis.  Critical Value/emergent results were called by telephone at the time of interpretation on 06/24/2013 at 10:45 AM to Dr.Porshe Fleagle [facility] , who verbally acknowledged these results.   Electronically Signed   By: Charlett Nose M.D.   On: 06/24/2013 10:46   Ct Abdomen Pelvis W Contrast 06/20/2013   CLINICAL DATA:  Abdominal pain.  EXAM: CT ABDOMEN AND PELVIS WITH CONTRAST  TECHNIQUE: Multidetector CT imaging of the abdomen and pelvis was performed using the standard protocol following bolus administration of intravenous contrast.  CONTRAST:  OMNIPAQUE IOHEXOL 300 MG/ML SOLN, 50mL OMNIPAQUE IOHEXOL 300 MG/ML SOLN  COMPARISON:  None.  FINDINGS: Visualized lung bases appear  normal. The liver, spleen and pancreas appear normal. No gallstones are noted. Adrenal glands appear normal. Kidneys demonstrate a horseshoe configuration without hydronephrosis or renal obstruction. No evidence of bowel obstruction is noted. Urinary bladder appears normal. Uterus appears normal. No abnormal fluid collection is noted.  IMPRESSION: No acute abnormality seen in the abdomen or pelvis.   Electronically Signed   By: Roque Lias M.D.   On: 06/20/2013 19:41   Dg Abd Acute W/chest 06/24/2013   CLINICAL DATA:  Right low back pain for 1 week. Earlier vomiting and cramping.  EXAM: ACUTE ABDOMEN SERIES (ABDOMEN 2 VIEW & CHEST 1 VIEW)  COMPARISON:  06/20/2013  FINDINGS: Mild cardiomegaly noted with subsegmental atelectasis in the lingula and indistinct retro diaphragmatic opacity on the right which could reflect atelectasis or early pneumonia.  There is contrast remaining within the colon. Prominent gas-filled loop of colon in the upper pelvis may represent cecum or less likely sigmoid colon, query ileus. The colon is primarily gas field. No definite dilated small bowel noted. There are scattered air-fluid levels in the colon.  Thoracolumbar spondylosis and degenerative disk disease noted.  Questionable wall thickening on recent CT along with potential endometrial thickening.  IMPRESSION: 1. Suspected colonic ileus, with the air-fluid levels and residual contrast in the mildly distended colon. The cecum is distended more than the rest of the colon. 2. Recent CT head some questionable gallbladder wall thickening and a somewhat distended gallbladder - gallbladder sonography is recommended to rule out cholecystitis. Also endometrial thickening which seems atypical for age and likely warrants pelvic sonography to assess the uterus. 3. Lumbar spondylosis and degenerative disc disease 4. Atelectasis or early pneumonia in the right lower lobe. 5. Cardiomegaly 6. Lingular scarring   Electronically Signed  By: Herbie Baltimore M.D.   On: 06/24/2013 08:43   Results for MEKA, LEWAN (MRN [id number]) as of 06/24/2013 11:22  Ref. Range 06/20/2013 17:57 06/24/2013 08:07  Sodium Latest Range: 135-145 mEq/L 135 128 (L)  Total Bilirubin Latest Range: 0.3-1.2 mg/dL 0.7 1.7 (H)   Results for ZULMA, COURT (MRN [id number]) as of 06/24/2013 11:22  Ref. Range 06/20/2013 17:57 06/24/2013 08:07  WBC Latest Range: 4.0-10.5 K/uL 14.6 (H) 18.6 (H)    1100:  WBC elevated and Na lower than previous ED visit. T.bili also elevated. [place] with acute cholecystitis; will start IV abx. Dx and testing d/w pt and family.  Questions answered.  Verb understanding, agreeable to admit. T/C to General Surgery PA, case discussed, including:  HPI, pertinent PM/SHx, VS/PE, dx testing, ED course and treatment:  Agreeable to come to ED for eval to admit.         Laray Anger, DO 06/25/13 2132

## 2013-06-24 NOTE — ED Notes (Signed)
Family at bedside. 

## 2013-06-24 NOTE — ED Notes (Signed)
Attempted report 

## 2013-06-24 NOTE — OR Nursing (Signed)
Extubated, placed on nasal cannula and is appropriately responsive Lisa Barrera

## 2013-06-24 NOTE — ED Notes (Signed)
Dr. McManus at bedside. 

## 2013-06-24 NOTE — Procedures (Signed)
Extubation Procedure Note  Patient Details:   Name: Lisa Barrera DOB: 10-20-1925 MRN: [id number]   Airway Documentation:     Evaluation  O2 sats: stable throughout Complications: No apparent complications Patient did tolerate procedure well. Bilateral Breath Sounds: Clear   Yes  Newt Lukes 06/24/2013, 6:51 PM

## 2013-06-24 NOTE — ED Notes (Signed)
Pt states pain started last Friday. RLQ abdominal pain. Pt denies n/v/d. Pt denies fever. Pt denies pain with urination and denies blood in stool and urine. Pt alert and mentating appropriately. Pt states "pretty severe at times."

## 2013-06-24 NOTE — OR Nursing (Signed)
Placed on tbar /via vent at 5/5

## 2013-06-24 NOTE — Progress Notes (Signed)
Patient admitted from the ED with sons at bedside.  Patient alert and oriented.  IV infusing.  Patient oriented to unit and call bell within reach.  Informed consent obtained for surgery.  Vital signs stable.  Will continue to monitor.

## 2013-06-24 NOTE — Anesthesia Postprocedure Evaluation (Signed)
  Anesthesia Post-op Note  Patient: Lisa Barrera  Procedure(s) Performed: Procedure(s): LAPAROSCOPIC CHOLECYSTECTOMY WITH INTRAOPERATIVE CHOLANGIOGRAM (N/A)  Patient Location: PACU  Anesthesia Type:General  Level of Consciousness: awake, alert , oriented and patient cooperative  Airway and Oxygen Therapy: Patient Spontanous Breathing and Patient connected to nasal cannula oxygen  Post-op Pain: none  Post-op Assessment: Post-op Vital signs reviewed, Patient's Cardiovascular Status Stable, Respiratory Function Stable, Patent Airway, No signs of Nausea or vomiting and Pain level controlled  Post-op Vital Signs: Reviewed and stable  Complications: No apparent anesthesia complications

## 2013-06-24 NOTE — Op Note (Signed)
Pre Operative Diagnosis: acute cholecystitis  Post Operative Diagnosis: same  Surgeon: Dr. Axel Filler   Procedure: lap chole with IOC and primary UHR  Assistant: none  Anesthesia: Gen. Endotracheal anesthesia   EBL: 10cc  Complications:  Counts: reported as correct x 2   Findings: The patient had a normal IOC.  Acute inflammed gallbladder with many stones.  Pt also with a primary UH.  Indications for procedure: Pt is a 79 y/o F with RUQ pain x 1 week.  She presented to the ED and w/u showed acute cholecystitis.  Details of the procedure:  The patient was taken to the operating and placed in the supine position with bilateral SCDs in place. A time out was called and all facts were verified. A pneumoperitoneum was obtained via A Veress needle technique to a pressure of 14mm of mercury. A 5mm trochar was then placed in the right upper quadrant under visualization, and there were no injuries to any abdominal organs. A 11 mm port was then placed in the umbilical region after infiltrating with local anesthesia under direct visualization. A second and third epigastric port and right lower quadrant port placement under direct visualization, respectively. The gallbladder was identified and retracted, the peritoneum was then sharply dissected from the gallbladder and this dissection was carried down to Calot's triangle. The gallbladder was identified and stripped away circumferentially and seen going into the gallbladder 360, the critical angle was obtained. A Cook catheter was used to perform an intraoperative cholangiogram. The biliary radicals as well as the cystic duct and common bile duct were seen free of filling defects.  2 clips were placed proximally one distally and the cystic duct transected. The cystic artery was identified and 2 clips placed proximally and one distally and transected.  We then proceeded to remove the gallbladder off the hepatic fossa with Bovie cautery.  There was  spillage of stones.  These were placed in and EndoCatch bag.  An Endo Catch bag was then placed in the abdomen and gallbladder placed in the bag. The hepatic fossa was then reexamined and hemostasis was achieved with Bovie cautery and was excellent at the end of the case. The subhepatic fossa and perihepatic fossa was then irrigated until the effluent was clear. The 11 mm trocar fascia was reapproximated with the 0 Vicryl in a figure of eight fashion x 2.  The pneumoperitoneum was evacuated and all trochars removed under direct visulalization.  The skin was then closed with 4-0 Monocryl and the skin dressed with Steri-Strips, gauze, and tape.  The patient was awaken from general anesthesia and taken to the recovery room in stable condition.

## 2013-06-24 NOTE — ED Notes (Signed)
Surgery at bedside.

## 2013-06-24 NOTE — Anesthesia Preprocedure Evaluation (Signed)
Anesthesia Evaluation  Patient identified by MRN, date of birth, ID band Patient awake    Reviewed: Allergy & Precautions, H&P , NPO status , Patient's Chart, lab work & pertinent test results  History of Anesthesia Complications (+) DIFFICULT AIRWAY  Airway Mallampati: II      Dental   Pulmonary neg pulmonary ROS,  breath sounds clear to auscultation        Cardiovascular negative cardio ROS  Rhythm:Regular Rate:Normal     Neuro/Psych    GI/Hepatic Neg liver ROS, GI history noted. CE   Endo/Other  Hypothyroidism   Renal/GU negative Renal ROS     Musculoskeletal   Abdominal   Peds  Hematology   Anesthesia Other Findings   Reproductive/Obstetrics                           Anesthesia Physical Anesthesia Plan  ASA: II  Anesthesia Plan: General   Post-op Pain Management:    Induction: Intravenous  Airway Management Planned: Oral ETT  Additional Equipment:   Intra-op Plan:   Post-operative Plan: Extubation in OR  Informed Consent: I have reviewed the patients History and Physical, chart, labs and discussed the procedure including the risks, benefits and alternatives for the proposed anesthesia with the patient or authorized representative who has indicated his/her understanding and acceptance.   Dental advisory given  Plan Discussed with: CRNA, Anesthesiologist and Surgeon  Anesthesia Plan Comments:         Anesthesia Quick Evaluation

## 2013-06-24 NOTE — H&P (Signed)
Chief Complaint: abdominal pain  HPI: Lisa Barrera is an 79 y.o. Female with a past medical history of hypothyroidism who presented to ED for the second time in 1 week complaining of abdominal pain.  Duration of symptoms is 1 week, but reports vague symptoms that "I didn't pay much attention to" over a unknown period of time.  Location of pain is right abdomen and without radiation.  Characterized as sharp intermittent pain that waxes and wanes in severity.  She denies aggravating factors including eating.  Alleviating factors include; pain medication which she was given at the independent nursing facility.  Associated symptoms include; nausea.  She denies fever, chills or sweats.  Denies diarrhea, constipation, melena, weight loss.  She ate dinner last night, pain typically develops several hours after that and lasts all night.  No significant medical problems, denies anticoagulation use.    Past Medical History  Diagnosis Date  . Hypothyroid   . Eczema   . Vitamin D deficiency     Past Surgical History  Procedure Laterality Date  . Tonsillectomy      Family History  Problem Relation Age of Onset  . Multiple myeloma Mother   . Prostate cancer Father   . Heart disease Father    Social History:  reports that she has never smoked. She does not have any smokeless tobacco history on file. She reports that she drinks alcohol. Her drug history is not on file.  Allergies: No Known Allergies   (Not in a hospital admission)  Results for orders placed during the hospital encounter of 06/24/13 (from the past 48 hour(s))  CBC WITH DIFFERENTIAL     Status: Abnormal   Collection Time    06/24/13  8:07 AM      Result Value Range   WBC 18.6 (*) 4.0 - 10.5 K/uL   RBC 4.25  3.87 - 5.11 MIL/uL   Hemoglobin 13.9  12.0 - 15.0 g/dL   HCT 08.6  57.8 - 46.9 %   MCV 93.9  78.0 - 100.0 fL   MCH 32.7  26.0 - 34.0 pg   MCHC 34.8  30.0 - 36.0 g/dL   RDW 62.9  52.8 - 41.3 %   Platelets 180  150 -  400 K/uL   Neutrophils Relative % 84 (*) 43 - 77 %   Neutro Abs 15.6 (*) 1.7 - 7.7 K/uL   Lymphocytes Relative 4 (*) 12 - 46 %   Lymphs Abs 0.8  0.7 - 4.0 K/uL   Monocytes Relative 12  3 - 12 %   Monocytes Absolute 2.2 (*) 0.1 - 1.0 K/uL   Eosinophils Relative 0  0 - 5 %   Eosinophils Absolute 0.0  0.0 - 0.7 K/uL   Basophils Relative 0  0 - 1 %   Basophils Absolute 0.0  0.0 - 0.1 K/uL  COMPREHENSIVE METABOLIC PANEL     Status: Abnormal   Collection Time    06/24/13  8:07 AM      Result Value Range   Sodium 128 (*) 135 - 145 mEq/L   Potassium 3.6  3.5 - 5.1 mEq/L   Chloride 92 (*) 96 - 112 mEq/L   CO2 23  19 - 32 mEq/L   Glucose, Bld 114 (*) 70 - 99 mg/dL   BUN 17  6 - 23 mg/dL   Creatinine, Ser 2.44  0.50 - 1.10 mg/dL   Calcium 9.1  8.4 - 01.0 mg/dL   Total Protein 6.8  6.0 - 8.3  g/dL   Albumin 2.7 (*) 3.5 - 5.2 g/dL   AST 18  0 - 37 U/L   ALT 14  0 - 35 U/L   Alkaline Phosphatase 92  39 - 117 U/L   Total Bilirubin 1.7 (*) 0.3 - 1.2 mg/dL   GFR calc non Af Amer 77 (*) >90 mL/min   GFR calc Af Amer 89 (*) >90 mL/min   Comment: (NOTE)     The eGFR has been calculated using the CKD EPI equation.     This calculation has not been validated in all clinical situations.     eGFR's persistently <90 mL/min signify possible Chronic Kidney     Disease.  LIPASE, BLOOD     Status: None   Collection Time    06/24/13  8:07 AM      Result Value Range   Lipase 13  11 - 59 U/L  CG4 I-STAT (LACTIC ACID)     Status: None   Collection Time    06/24/13  8:17 AM      Result Value Range   Lactic Acid, Venous 1.27  0.5 - 2.2 mmol/L   US Abdomen Complete  06/24/2013   CLINICAL DATA:  Right-sided abdominal pain.  EXAM: ULTRASOUND ABDOMEN COMPLETE  COMPARISON:  CT 06/20/2013  FINDINGS: Gallbladder  Numerous layering gallstones within the gallbladder. Gallbladder wall is thickened at 4 mm. There is surrounding pericholecystic fluid. The patient was not tender over the gallbladder during the  study. Gallbladder moderately distended.  Common bile duct  Diameter: Normal caliber, 5 mm.  Liver  Normal size and echotexture. No focal abnormality. No biliary ductal dilatation.  IVC  No abnormality visualized.  Pancreas  Visualized portion unremarkable.  Spleen  Size and appearance within normal limits.  Right Kidney  Length: 9.3 cm. Mild cortical thinning. Normal echotexture. No hydronephrosis.  Left Kidney  Length: 9.7 cm. Mild cortical thinning. Echogenicity within normal limits. No mass or hydronephrosis visualized.  Abdominal aorta  No aneurysm visualized.  IMPRESSION: Distended gallbladder with numerous layering stones, wall thickening and pericholecystic fluid. Despite the lack of sonographic Murphy sign, the appearance is worrisome for acute cholecystitis.  Critical Value/emergent results were called by telephone at the time of interpretation on 06/24/2013 at 10:45 AM to Dr.KATHLEEN [facility] , who verbally acknowledged these results.   Electronically Signed   By: Charlett Nose M.D.   On: 06/24/2013 10:46   Dg Abd Acute W/chest  06/24/2013   CLINICAL DATA:  Right low back pain for 1 week. Earlier vomiting and cramping.  EXAM: ACUTE ABDOMEN SERIES (ABDOMEN 2 VIEW & CHEST 1 VIEW)  COMPARISON:  06/20/2013  FINDINGS: Mild cardiomegaly noted with subsegmental atelectasis in the lingula and indistinct retro diaphragmatic opacity on the right which could reflect atelectasis or early pneumonia.  There is contrast remaining within the colon. Prominent gas-filled loop of colon in the upper pelvis may represent cecum or less likely sigmoid colon, query ileus. The colon is primarily gas field. No definite dilated small bowel noted. There are scattered air-fluid levels in the colon.  Thoracolumbar spondylosis and degenerative disk disease noted.  Questionable wall thickening on recent CT along with potential endometrial thickening.  IMPRESSION: 1. Suspected colonic ileus, with the air-fluid levels and residual  contrast in the mildly distended colon. The cecum is distended more than the rest of the colon. 2. Recent CT head some questionable gallbladder wall thickening and a somewhat distended gallbladder - gallbladder sonography is recommended to rule out cholecystitis. Also  endometrial thickening which seems atypical for age and likely warrants pelvic sonography to assess the uterus. 3. Lumbar spondylosis and degenerative disc disease 4. Atelectasis or early pneumonia in the right lower lobe. 5. Cardiomegaly 6. Lingular scarring   Electronically Signed   By: Herbie Baltimore M.D.   On: 06/24/2013 08:43    Review of Systems  Constitutional: Positive for malaise/fatigue. Negative for fever, chills, weight loss and diaphoresis.  Eyes: Negative for blurred vision and photophobia.  Respiratory: Negative for shortness of breath and wheezing.   Cardiovascular: Negative for chest pain, palpitations, orthopnea, leg swelling and PND.  Gastrointestinal: Positive for nausea. Negative for heartburn, vomiting, abdominal pain, diarrhea, constipation, blood in stool and melena.  Genitourinary: Negative for dysuria, urgency, hematuria and flank pain.  Neurological: Negative for dizziness, seizures, loss of consciousness, weakness and headaches.  Psychiatric/Behavioral: Positive for memory loss. The patient does not have insomnia.     Blood pressure 114/95, pulse 71, temperature 97.4 F (36.3 C), temperature source Oral, resp. rate 16, weight 155 lb (70.308 kg), SpO2 91.00%. Physical Exam  Constitutional: She is oriented to person, place, and time. She appears well-developed and well-nourished. No distress.  HENT:  Head: Normocephalic and atraumatic.  Mouth/Throat: No oropharyngeal exudate.  Eyes: Conjunctivae and EOM are normal. Pupils are equal, round, and reactive to light. No scleral icterus.  Neck: Normal range of motion. Neck supple.  Cardiovascular: Normal rate, regular rhythm, normal heart sounds and intact  distal pulses.  Exam reveals no gallop and no friction rub.   No murmur heard. Respiratory: Effort normal and breath sounds normal. No respiratory distress. She has no wheezes. She has no rales. She exhibits no tenderness.  GI: Soft. Bowel sounds are normal. She exhibits no distension and no mass. There is no rebound and no guarding.  +murphy's sign, TTP RUQ and RLQ  Musculoskeletal: Normal range of motion. She exhibits no edema and no tenderness.  Lymphadenopathy:    She has no cervical adenopathy.  Neurological: She is alert and oriented to person, place, and time.  Skin: Skin is warm and dry. No rash noted. She is not diaphoretic. No erythema. No pallor.  Psychiatric: Her behavior is normal. Judgment and thought content normal.     Assessment/Plan Abdominal pain Leukocytosis Acute cholecystitis Hypothyroidism  Plan: admit for IV antibiotics, NPO for now, IVF, pain control, antiemetics.  Plan for laparoscopic cholecystectomy today around 3:30p.  Risks and benefits of the surgery discussed with the patient and her son Genelle Bal including but not limited to infection, bleeding, death, open cholecystectomy, bile leak.  Both verbalize understanding and wish to proceed.   Ashok Norris ANP-BC Pager [phone]  06/24/2013, 11:34 AM

## 2013-06-25 DIAGNOSIS — K8010 Calculus of gallbladder with chronic cholecystitis without obstruction: Secondary | ICD-10-CM | POA: Diagnosis not present

## 2013-06-25 LAB — URINE CULTURE: Colony Count: >=100000

## 2013-06-25 MED ORDER — HYDROCODONE-ACETAMINOPHEN 5-325 MG PO TABS: 1.0000 | ORAL_TABLET | Freq: Four times a day (QID) | ORAL | Status: DC | PRN

## 2013-06-25 MED ORDER — MUPIROCIN 2 % EX OINT
1.0000 "application " | TOPICAL_OINTMENT | Freq: Two times a day (BID) | CUTANEOUS | Status: DC
  Administered 2013-06-25: 1 via NASAL
  Filled 2013-06-25: qty 22

## 2013-06-25 MED ORDER — CHLORHEXIDINE GLUCONATE CLOTH 2 % EX PADS
6.0000 | MEDICATED_PAD | Freq: Every day | CUTANEOUS | Status: DC
  Administered 2013-06-25: 6 via TOPICAL

## 2013-06-25 NOTE — Discharge Instructions (Signed)
[facility] ______[facility] °LAPAROSCOPIC SURGERY: POST OP INSTRUCTIONS °Always review your discharge instruction sheet given to you by the facility where your surgery was performed. °IF YOU HAVE DISABILITY OR FAMILY LEAVE FORMS, YOU MUST BRING THEM TO THE OFFICE FOR PROCESSING.   °DO NOT GIVE THEM TO YOUR DOCTOR. ° °1. A prescription for pain medication may be given to you upon discharge.  Take your pain medication as prescribed, if needed.  If narcotic pain medicine is not needed, then you may take acetaminophen (Tylenol) or ibuprofen (Advil) as needed. °2. Take your usually prescribed medications unless otherwise directed. °3. If you need a refill on your pain medication, please contact your pharmacy.  They will contact our office to request authorization. Prescriptions will not be filled after 5pm or on week-ends. °4. You should follow a light diet the first few days after arrival home, such as soup and crackers, etc.  Be sure to include lots of fluids daily. °5. Most patients will experience some swelling and bruising in the area of the incisions.  Ice packs will help.  Swelling and bruising can take several days to resolve.  °6. It is common to experience some constipation if taking pain medication after surgery.  Increasing fluid intake and taking a stool softener (such as Colace) will usually help or prevent this problem from occurring.  A mild laxative (Milk of Magnesia or Miralax) should be taken according to package instructions if there are no bowel movements after 48 hours. °7. Unless discharge instructions indicate otherwise, you may remove your bandages 24-48 hours after surgery, and you may shower at that time.  You may have steri-strips (small skin tapes) in place directly over the incision.  These strips should be left on the skin for 7-10 days.  If your surgeon used skin glue on the incision, you may shower in 24 hours.  The glue will flake off over the next 2-3 weeks.  Any sutures or  staples will be removed at the office during your follow-up visit. °8. ACTIVITIES:  You may resume regular (light) daily activities beginning the next day--such as daily self-care, walking, climbing stairs--gradually increasing activities as tolerated.  You may have sexual intercourse when it is comfortable.  Refrain from any heavy lifting or straining until approved by your doctor. °a. You may drive when you are no longer taking prescription pain medication, you can comfortably wear a seatbelt, and you can safely maneuver your car and apply brakes. °b. RETURN TO WORK:  __________________________________________________________ °9. You should see your doctor in the office for a follow-up appointment approximately 2-3 weeks after your surgery.  Make sure that you call for this appointment within a day or two after you arrive home to insure a convenient appointment time. °10. OTHER INSTRUCTIONS: __________________________________________________________________________________________________________________________ __________________________________________________________________________________________________________________________ °WHEN TO CALL YOUR DOCTOR: °1. Fever over 101.0 °2. Inability to urinate °3. Continued bleeding from incision. °4. Increased pain, redness, or drainage from the incision. °5. Increasing abdominal pain ° °The clinic staff is available to answer your questions during regular business hours.  Please don’t hesitate to call and ask to speak to one of the nurses for clinical concerns.  If you have a medical emergency, go to the nearest emergency room or call 911.  A surgeon from [facility] is always on call at the hospital. °[address], [address], [place], [place]  [address] ? [address] [address], [place], [place]   [address] °[phone] ? [phone] ? FAX [phone] °Web site:   [email or web address] °

## 2013-06-25 NOTE — Progress Notes (Signed)
CSW Proofreader) spoke with pt and pt son. Pt is from [facility]. Plan is for pt to return to Independent Living. No further social work needs identified. CSW signing off.  Kalasia Crafton, LCSWA [phone]

## 2013-06-25 NOTE — Discharge Summary (Signed)
Physician Discharge Summary  Patient ID: Lisa Barrera MRN: [id number] DOB/AGE: 13-[name]-1926 79 y.o.  Admit date: 06/24/2013 Discharge date: 06/25/2013  Admitting Diagnosis: Acute cholecystitis Leukocytosis Abdominal pain  Discharge Diagnosis Patient Active Problem List   Diagnosis Date Noted  . Abdominal pain 06/24/2013  . Leukocytosis 06/24/2013  . Hypothyroidism 06/24/2013    Consultants None  Imaging: Dg Cholangiogram Operative  06/24/2013   CLINICAL DATA:  Laparoscopic cholecystectomy.  Acute cholecystitis.  EXAM: INTRAOPERATIVE CHOLANGIOGRAM  TECHNIQUE: Cholangiographic images from the C-arm fluoroscopic device were submitted for interpretation post-operatively. Please see the procedural report for the amount of contrast and the fluoroscopy time utilized.  COMPARISON:  Ultrasound 06/24/2013.  FINDINGS: The cystic duct is cannulated. The common bile duct is dilated. Moderate extravasation noted at the injection site. The 2nd series shows spillage of contrast into the duodenum. No definite common bile duct filling defects to suggest common bile duct stones.  IMPRESSION: Common bile duct dilatation but no definite filling defects to suggest common bowel duct stones.  Contrast spillage into the duodenum.  Significant contrast extravasation at the injection site.   Electronically Signed   By: Loralie Champagne M.D.   On: 06/24/2013 17:02   US Abdomen Complete  06/24/2013   CLINICAL DATA:  Right-sided abdominal pain.  EXAM: ULTRASOUND ABDOMEN COMPLETE  COMPARISON:  CT 06/20/2013  FINDINGS: Gallbladder  Numerous layering gallstones within the gallbladder. Gallbladder wall is thickened at 4 mm. There is surrounding pericholecystic fluid. The patient was not tender over the gallbladder during the study. Gallbladder moderately distended.  Common bile duct  Diameter: Normal caliber, 5 mm.  Liver  Normal size and echotexture. No focal abnormality. No biliary ductal dilatation.  IVC  No  abnormality visualized.  Pancreas  Visualized portion unremarkable.  Spleen  Size and appearance within normal limits.  Right Kidney  Length: 9.3 cm. Mild cortical thinning. Normal echotexture. No hydronephrosis.  Left Kidney  Length: 9.7 cm. Mild cortical thinning. Echogenicity within normal limits. No mass or hydronephrosis visualized.  Abdominal aorta  No aneurysm visualized.  IMPRESSION: Distended gallbladder with numerous layering stones, wall thickening and pericholecystic fluid. Despite the lack of sonographic Murphy sign, the appearance is worrisome for acute cholecystitis.  Critical Value/emergent results were called by telephone at the time of interpretation on 06/24/2013 at 10:45 AM to Dr.KATHLEEN [facility] , who verbally acknowledged these results.   Electronically Signed   By: Charlett Nose M.D.   On: 06/24/2013 10:46   Dg Abd Acute W/chest  06/24/2013   CLINICAL DATA:  Right low back pain for 1 week. Earlier vomiting and cramping.  EXAM: ACUTE ABDOMEN SERIES (ABDOMEN 2 VIEW & CHEST 1 VIEW)  COMPARISON:  06/20/2013  FINDINGS: Mild cardiomegaly noted with subsegmental atelectasis in the lingula and indistinct retro diaphragmatic opacity on the right which could reflect atelectasis or early pneumonia.  There is contrast remaining within the colon. Prominent gas-filled loop of colon in the upper pelvis may represent cecum or less likely sigmoid colon, query ileus. The colon is primarily gas field. No definite dilated small bowel noted. There are scattered air-fluid levels in the colon.  Thoracolumbar spondylosis and degenerative disk disease noted.  Questionable wall thickening on recent CT along with potential endometrial thickening.  IMPRESSION: 1. Suspected colonic ileus, with the air-fluid levels and residual contrast in the mildly distended colon. The cecum is distended more than the rest of the colon. 2. Recent CT head some questionable gallbladder wall thickening and a somewhat distended  gallbladder - gallbladder  sonography is recommended to rule out cholecystitis. Also endometrial thickening which seems atypical for age and likely warrants pelvic sonography to assess the uterus. 3. Lumbar spondylosis and degenerative disc disease 4. Atelectasis or early pneumonia in the right lower lobe. 5. Cardiomegaly 6. Lingular scarring   Electronically Signed   By: Herbie Baltimore M.D.   On: 06/24/2013 08:43    Procedures Dr. Derrell Lolling (06/25/13) - Laparoscopic Cholecystectomy with [facility]  Hospital Course:  79 y.o. Female with a past medical history of hypothyroidism who presented to [facility] for the second time in 1 week complaining of abdominal pain. Duration of symptoms is 1 week, but reports vague symptoms that "I didn't pay much attention to" over a unknown period of time. Location of pain is right abdomen and without radiation. Characterized as sharp intermittent pain that waxes and wanes in severity. She denies aggravating factors including eating. Alleviating factors include; pain medication which she was given at the independent nursing facility. Associated symptoms include; nausea. She denies fever, chills or sweats. Denies diarrhea, constipation, melena, weight loss. She ate dinner last night, pain typically develops several hours after that and lasts all night. No significant medical problems, denies anticoagulation use.   Workup showed evidence of cholecystitis on [place] and leukocytosis to 19.6.  Patient was admitted and underwent procedure listed above.  Tolerated procedure well and was transferred to the floor.  Diet was advanced as tolerated.  On POD #1, the patient was voiding well, tolerating diet, ambulating well, pain well controlled, vital signs stable, incisions c/d/i and felt stable for discharge home.  Patient will follow up in our office in 3 weeks and knows to call with questions or concerns.  Physical Exam: General:  Alert, NAD, pleasant, comfortable Abd:  Soft, ND, mild  tenderness, incisions C/D/I     Medication List         HYDROcodone-acetaminophen 5-325 MG per tablet  Commonly known as:  NORCO/VICODIN  Take 1-2 tablets by mouth every 6 (six) hours as needed.     levothyroxine 88 MCG tablet  Commonly known as:  SYNTHROID, LEVOTHROID  Take 88 mcg by mouth daily before breakfast.             Follow-up Information   Follow up with [facility] On 07/22/2013. (Your appointment is at 1:30pm, please arrive at least 30 minutes before your appointment to complete your check in paperwork.  If you are unable to arrive 30 minutes prior to your appointment time we may have to cancel or reschedule you.)    Contact information:   [address]   [place] [address] [phone]       Signed: Candiss Norse [facility] [phone]  06/25/2013, 7:28 AM

## 2013-06-25 NOTE — Progress Notes (Signed)
Discharge instructions gone over with patient and son present. Home medications gone over. Follow up appointment is made. Diet, activity, and incisional care gone over.  Prescription given. Vaccinations given prior to discharge. Signs and symptoms of infection gone over. Patient has my chart. Patient verbalized understanding of instructions.

## 2013-06-25 NOTE — H&P (Signed)
I have seen and examined the pt and agree with NP-Reibock's progress note. Acute cholecystitis To OR for Lap chole

## 2013-06-26 ENCOUNTER — Encounter (HOSPITAL_COMMUNITY): Payer: Self-pay | Admitting: General Surgery

## 2013-07-22 ENCOUNTER — Encounter (INDEPENDENT_AMBULATORY_CARE_PROVIDER_SITE_OTHER): Payer: Self-pay

## 2013-07-22 ENCOUNTER — Ambulatory Visit (INDEPENDENT_AMBULATORY_CARE_PROVIDER_SITE_OTHER): Payer: Medicare Other | Admitting: General Surgery

## 2013-07-22 VITALS — BP 110/80 | HR 88 | Temp 98.6°F | Resp 14 | Ht 62.0 in | Wt 148.0 lb

## 2013-07-22 DIAGNOSIS — K8000 Calculus of gallbladder with acute cholecystitis without obstruction: Secondary | ICD-10-CM

## 2013-07-22 NOTE — Patient Instructions (Signed)
Call if you have a problem. 

## 2013-07-22 NOTE — Progress Notes (Signed)
[facility] [birth year] [id number] 07/22/2013   Lisa Barrera is a 79 y.o. female who had a laparoscopic cholecystectomy with intraoperative cholangiogram by Dr. Axel Filler, MD .  The pathology report confirmed Gallbladder - ACUTE AND CHRONIC CHOLECYSTITIS AND CHOLELITHIASIS.Marland Kitchen  The patient reports that they are feeling well with normal bowel movements and good appetite.  The pre-operative symptoms of abdominal pain, nausea, and vomiting have resolved.    Physical examination  BP 110/80  Pulse 88  Temp(Src) 98.6 F (37 C) (Temporal)  Resp 14  Ht 5\' 2"  (1.575 m)  Wt 67.132 kg (148 lb)  BMI 27.06 kg/m2 - Incisions appear well-healed with no sign of infection or bleeding.  She looks great. Abdomen - soft, non-tender  Impression:  s/p laparoscopic cholecystectomy  Plan:  She may resume a regular diet and full activity.  She may follow-up on a PRN basis.

## 2013-10-08 DIAGNOSIS — E039 Hypothyroidism, unspecified: Secondary | ICD-10-CM | POA: Diagnosis not present

## 2014-04-08 DIAGNOSIS — Z23 Encounter for immunization: Secondary | ICD-10-CM | POA: Diagnosis not present

## 2014-04-08 DIAGNOSIS — Z0000 Encounter for general adult medical examination without abnormal findings: Secondary | ICD-10-CM | POA: Diagnosis not present

## 2014-04-08 DIAGNOSIS — Z136 Encounter for screening for cardiovascular disorders: Secondary | ICD-10-CM | POA: Diagnosis not present

## 2014-04-08 DIAGNOSIS — R351 Nocturia: Secondary | ICD-10-CM | POA: Diagnosis not present

## 2014-04-08 DIAGNOSIS — E039 Hypothyroidism, unspecified: Secondary | ICD-10-CM | POA: Diagnosis not present

## 2014-09-03 IMAGING — CT CT ABD-PELV W/ CM
2 of 5 series · 17 of 46 positions shown, 19 images · IV contrast (APPLIED)
Comparison: None.

CLINICAL DATA: Abdominal pain.

EXAM:
CT ABDOMEN AND PELVIS WITH CONTRAST
TECHNIQUE: Multidetector CT imaging of the abdomen and pelvis was performed
using the standard protocol following bolus administration of
intravenous contrast.
CONTRAST:  100mL OMNIPAQUE IOHEXOL 300 MG/ML SOLN, 50mL OMNIPAQUE
IOHEXOL 300 MG/ML SOLN

[Series 2: abd/pelvis 5.0 b31f · axial · 0.84mm/px · z∈[-424,-59]mm · 14 of 83 slices shown, 16 images]
[im 5/83  soft-tissue]
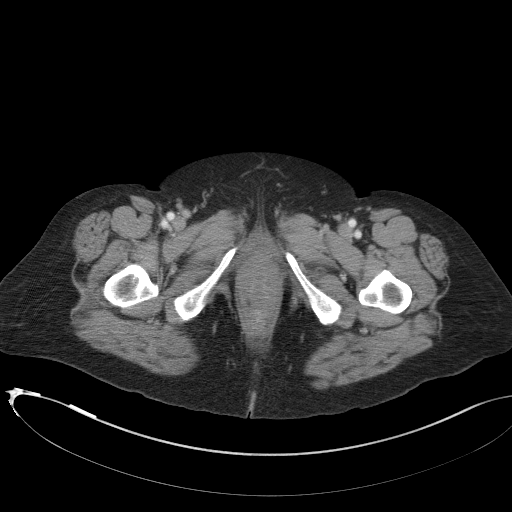
[im 5/83  bone]
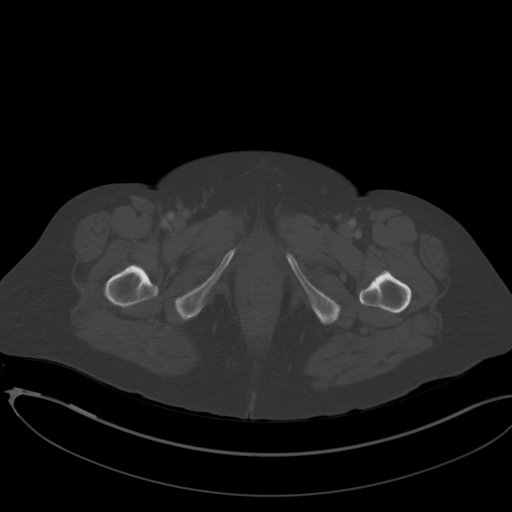
[im 13/83  soft-tissue]
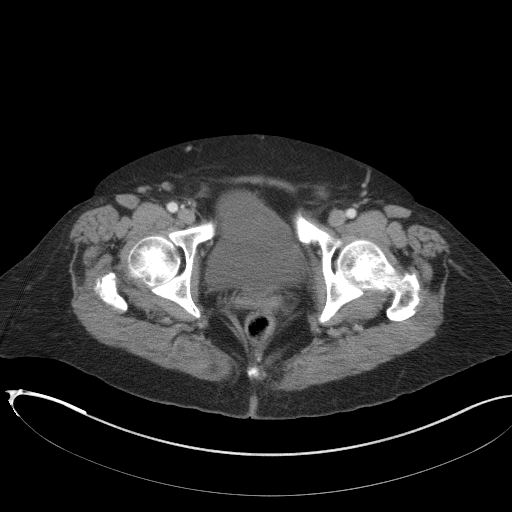
[im 17/83  soft-tissue]
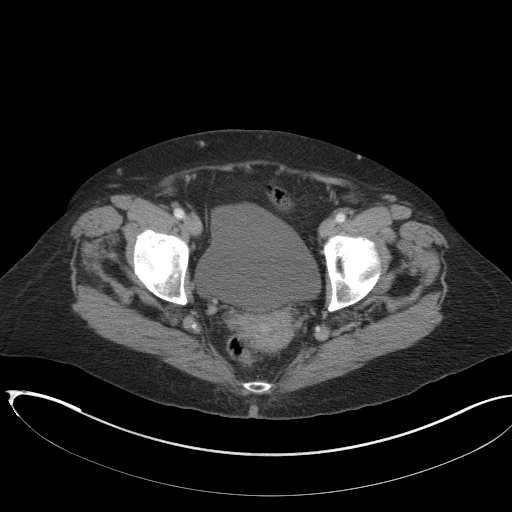
[im 21/83  soft-tissue]
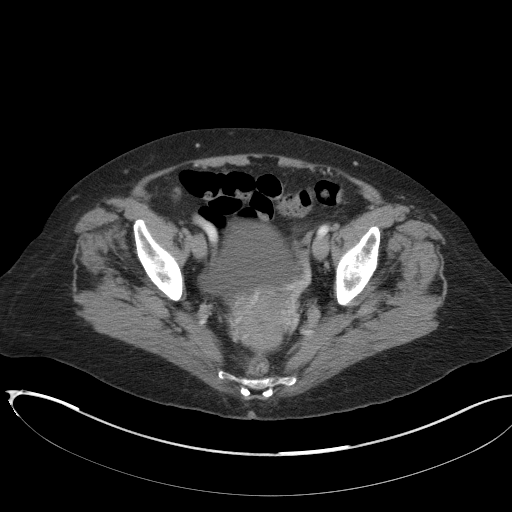
[im 29/83  soft-tissue]
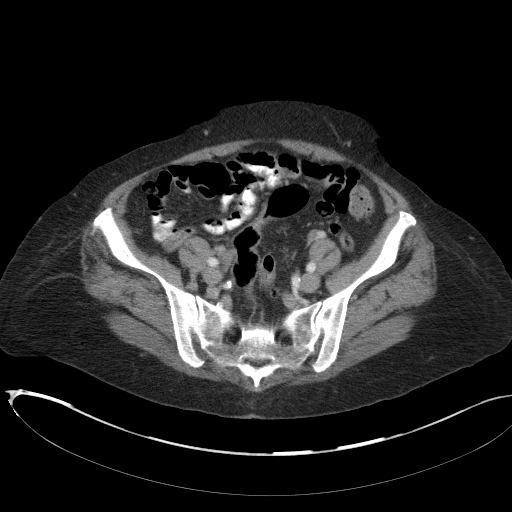
[im 33/83  soft-tissue]
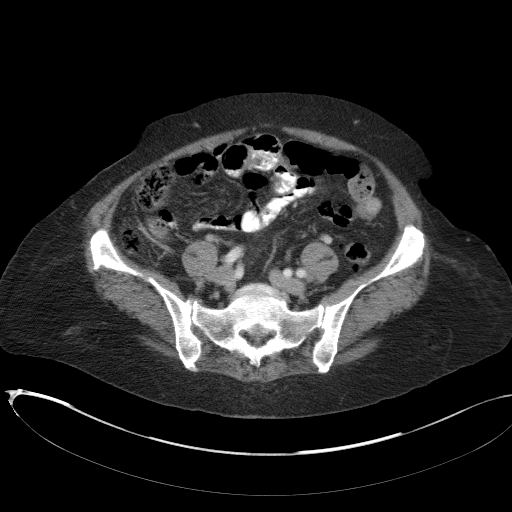
[im 37/83  soft-tissue]
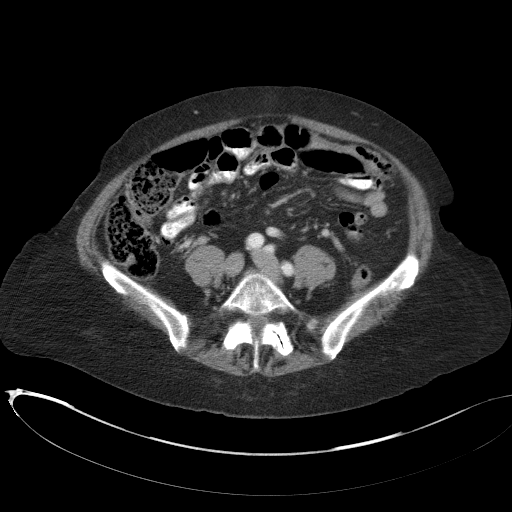
[im 46/83  soft-tissue]
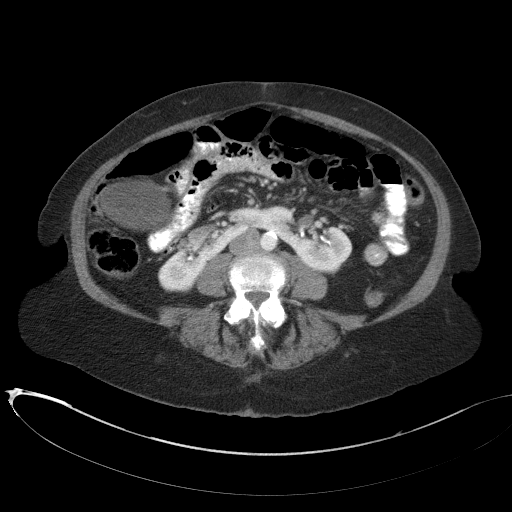
[im 50/83  soft-tissue]
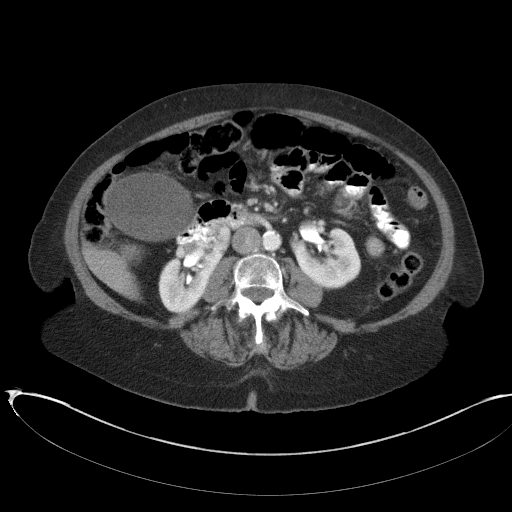
[im 50/83  bone]
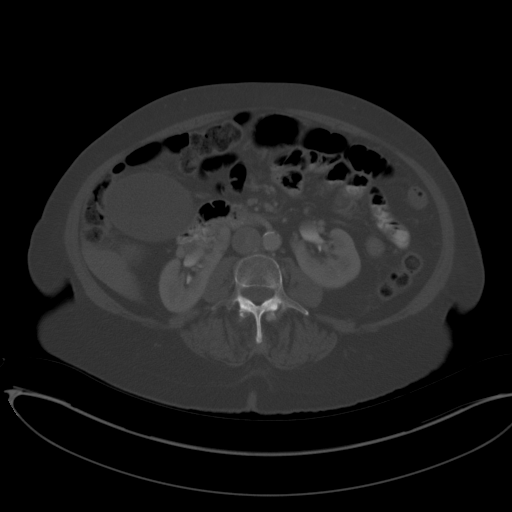
[im 54/83  soft-tissue]
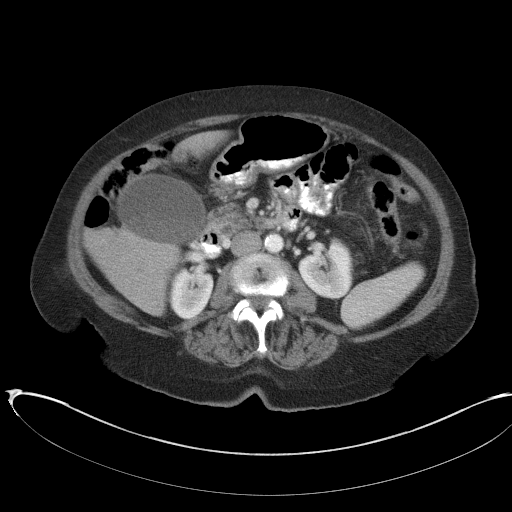
[im 62/83  soft-tissue]
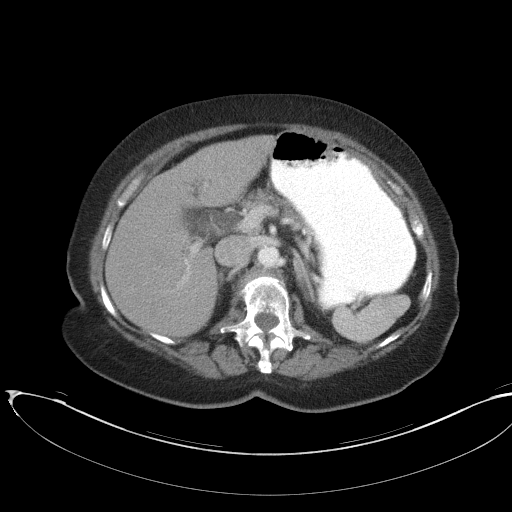
[im 66/83  soft-tissue]
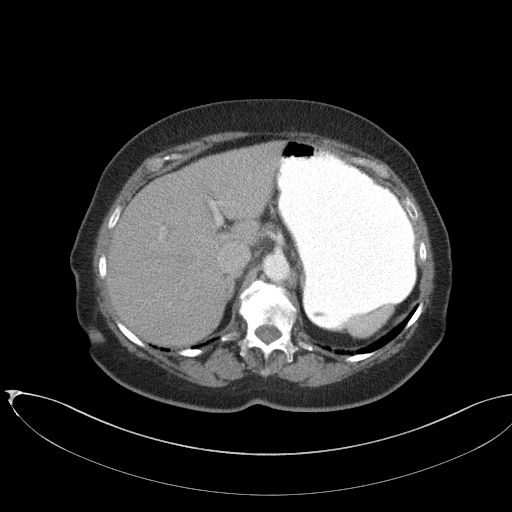
[im 70/83  soft-tissue]
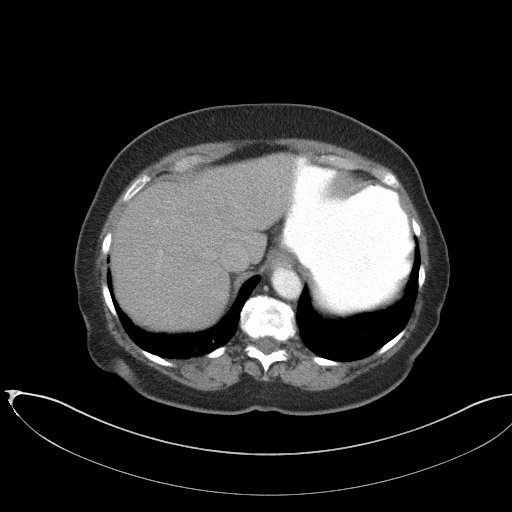
[im 78/83  soft-tissue]
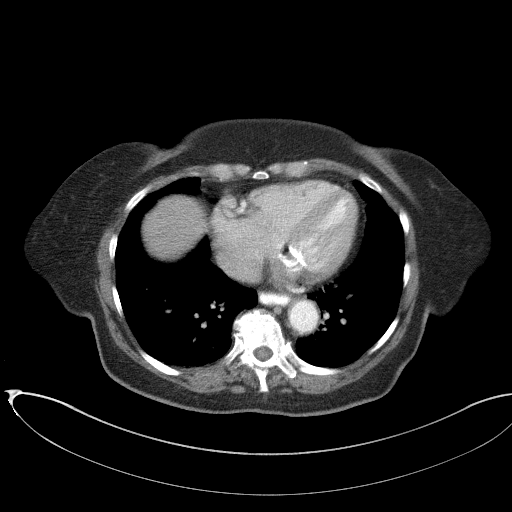

[Series 5: abd/pelvis 3.0 coronal · coronal · 0.87mm/px · 3 of 80 slices shown]
[im 27/80  soft-tissue]
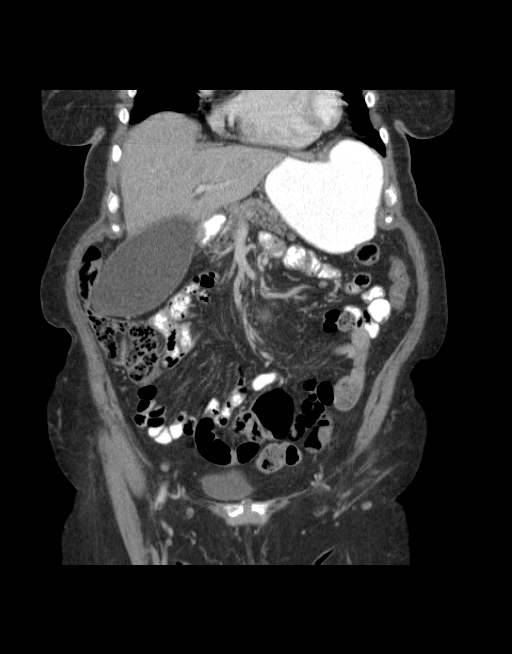
[im 36/80  soft-tissue]
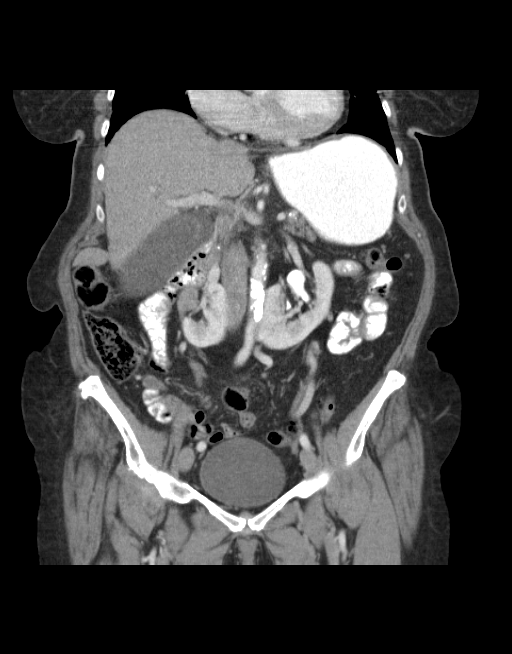
[im 44/80  soft-tissue]
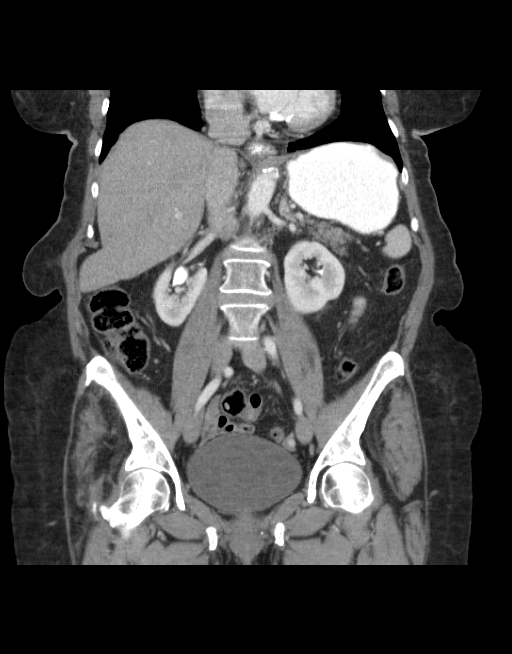

[17 of 46 positions shown; findings below may reference images not displayed]

FINDINGS: Visualized lung bases appear normal. The liver, spleen and pancreas
appear normal. No gallstones are noted. Adrenal glands appear
normal. Kidneys demonstrate a horseshoe configuration without
hydronephrosis or renal obstruction. No evidence of bowel
obstruction is noted. Urinary bladder appears normal. Uterus appears
normal. No abnormal fluid collection is noted.
IMPRESSION: No acute abnormality seen in the abdomen or pelvis.

## 2014-09-07 IMAGING — US US ABDOMEN COMPLETE
1 series · 13 of 25 positions shown · non-contrast
Comparison: CT 06/20/2013

CLINICAL DATA: Right-sided abdominal pain.

EXAM:
ULTRASOUND ABDOMEN COMPLETE

[Series 1: us abdomen complete · 0.22mm/px · 13 of 90 slices shown]
[im 1/90]
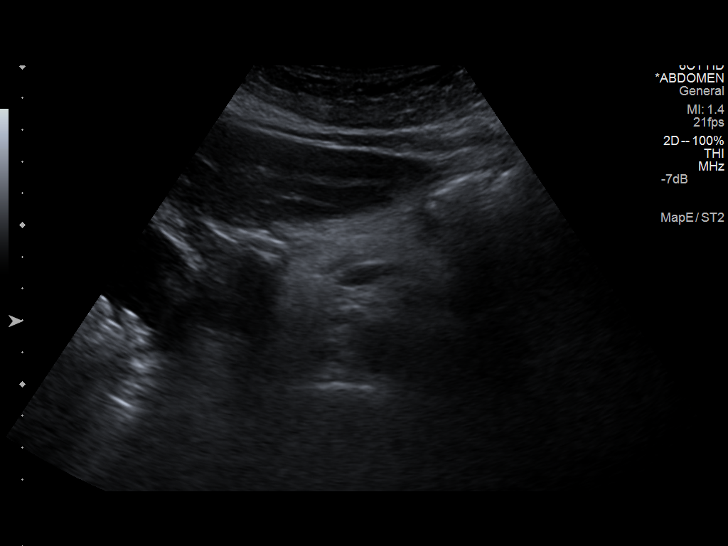
[im 8/90]
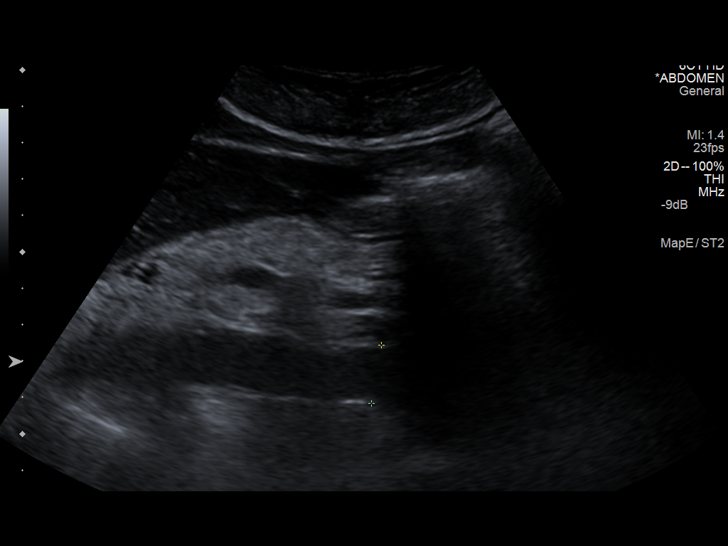
[im 15/90]
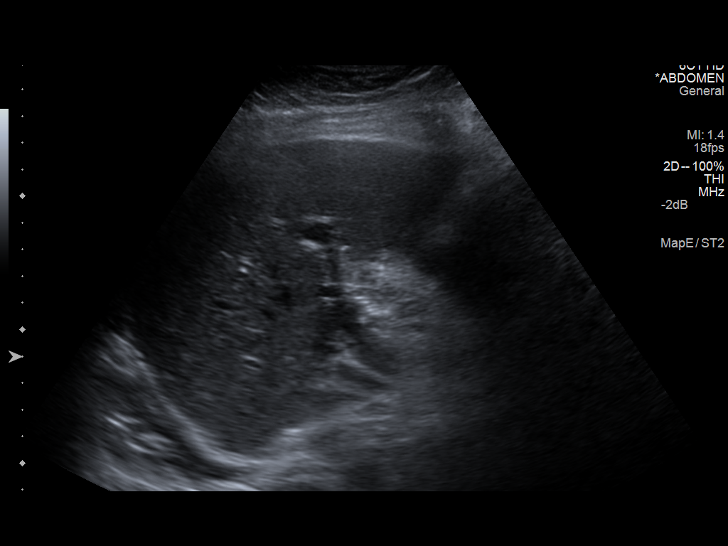
[im 23/90]
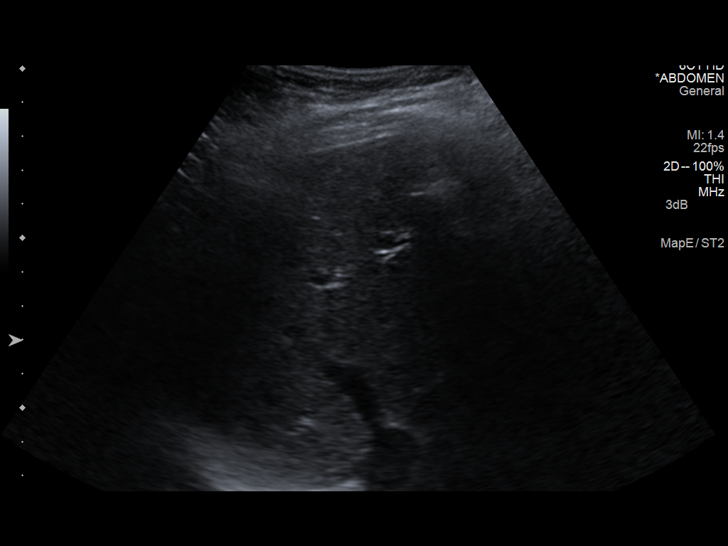
[im 30/90]
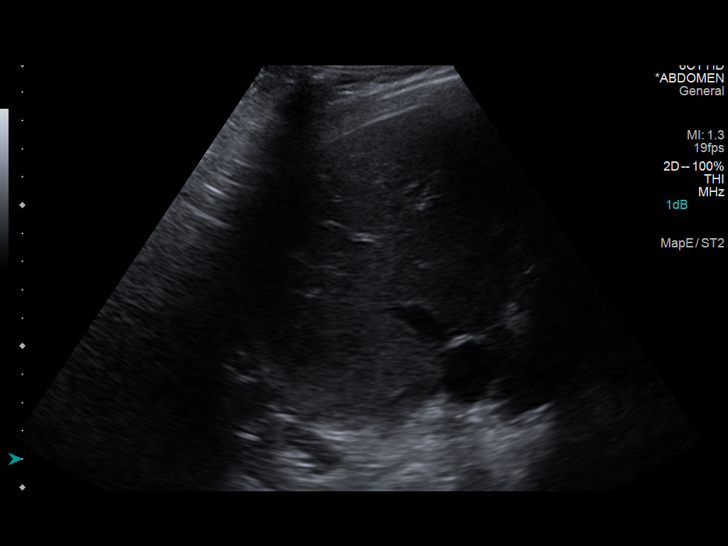
[im 38/90]
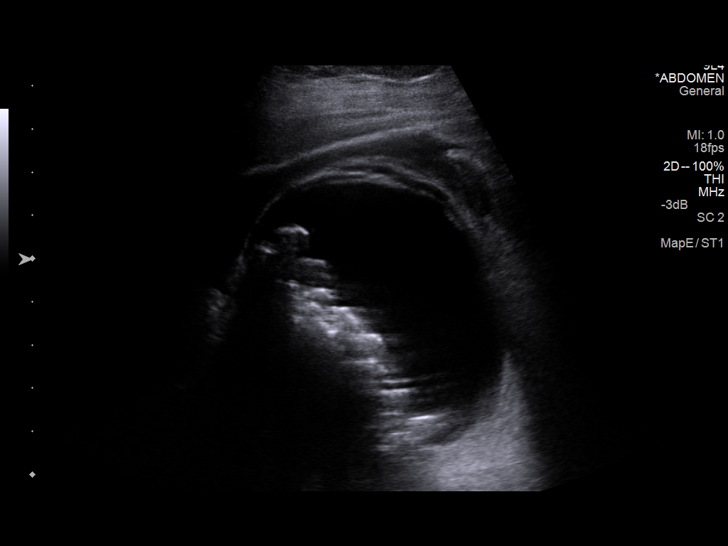
[im 45/90]
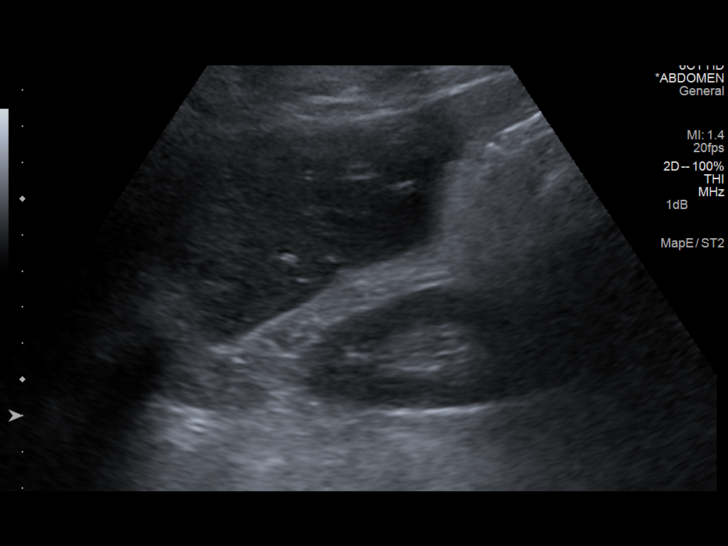
[im 52/90]
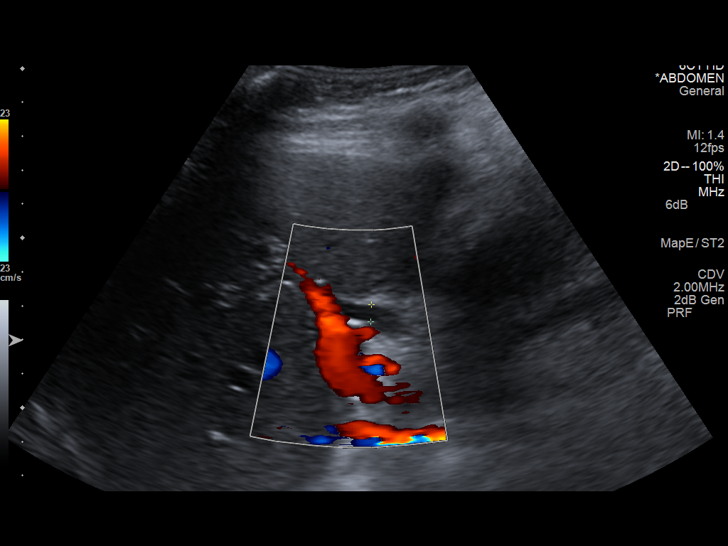
[im 60/90]
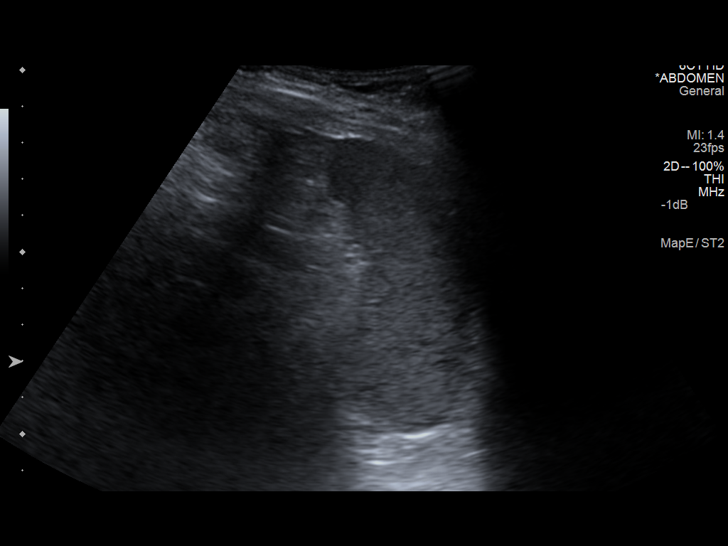
[im 67/90]
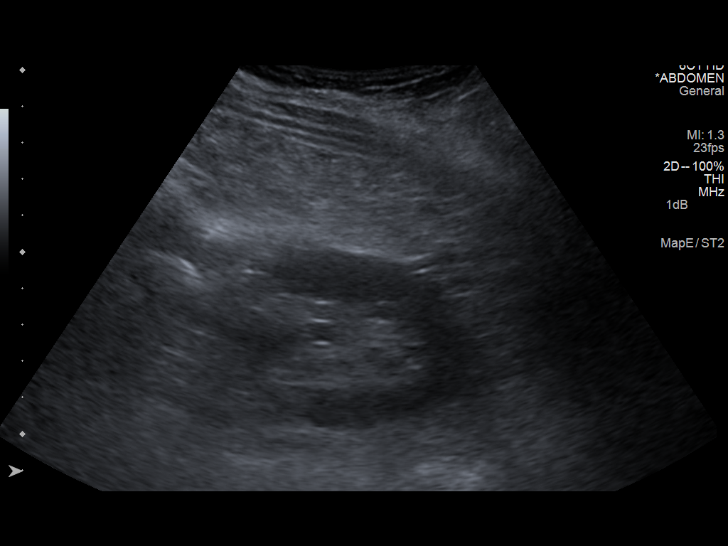
[im 75/90]
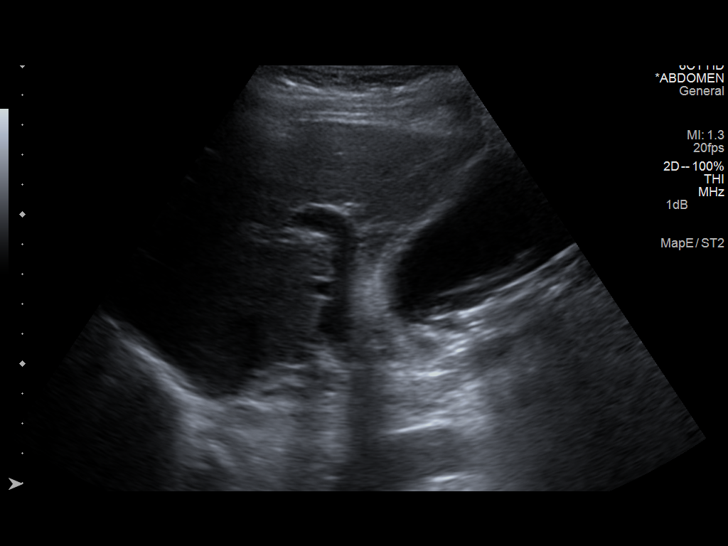
[im 82/90]
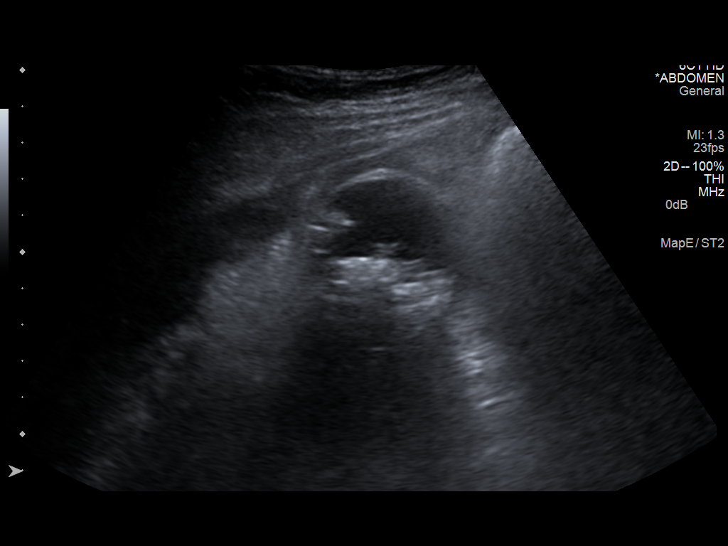
[im 90/90]
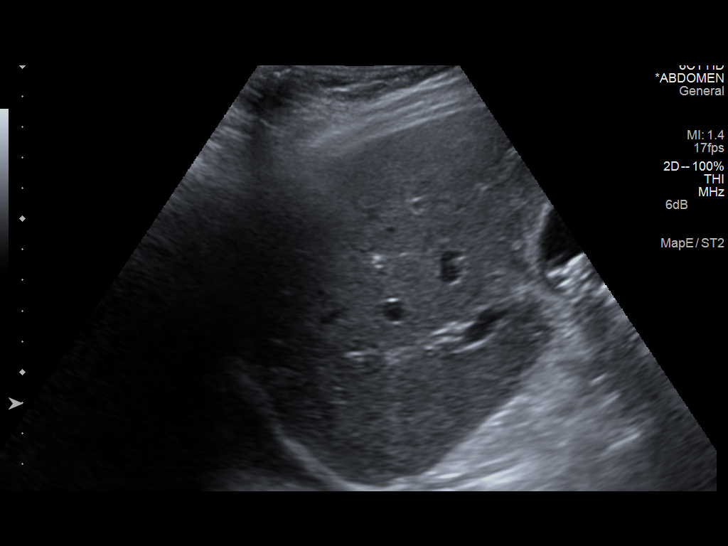

[13 of 25 positions shown; findings below may reference images not displayed]

FINDINGS: Gallbladder

Numerous layering gallstones within the gallbladder. Gallbladder
wall is thickened at 4 mm. There is surrounding pericholecystic
fluid. The patient was not tender over the gallbladder during the
study. Gallbladder moderately distended.

Common bile duct

Diameter: Normal caliber, 5 mm.

Liver

Normal size and echotexture. No focal abnormality. No biliary ductal
dilatation.

IVC

No abnormality visualized.

Pancreas

Visualized portion unremarkable.

Spleen

Size and appearance within normal limits.

Right Kidney

Length: 9.3 cm. Mild cortical thinning. Normal echotexture. No
hydronephrosis.

Left Kidney

Length: 9.7 cm. Mild cortical thinning. Echogenicity within normal
limits. No mass or hydronephrosis visualized.

Abdominal aorta

No aneurysm visualized.
IMPRESSION: Distended gallbladder with numerous layering stones, wall thickening
and pericholecystic fluid. Despite the lack of sonographic Murphy
sign, the appearance is worrisome for acute cholecystitis.

Critical Value/emergent results were called by telephone at the time
of interpretation on 06/24/2013 at [DATE] to Dr.TENARDO ZJ ROSH ,
who verbally acknowledged these results.

## 2014-09-07 IMAGING — RF DG CHOLANGIOGRAM OPERATIVE
1 series · 7 of 7 positions shown · non-contrast
Comparison: Ultrasound 06/24/2013.

CLINICAL DATA: Laparoscopic cholecystectomy.  Acute cholecystitis.

EXAM:
INTRAOPERATIVE CHOLANGIOGRAM
TECHNIQUE: Cholangiographic images from the C-arm fluoroscopic device were
submitted for interpretation post-operatively. Please see the
procedural report for the amount of contrast and the fluoroscopy
time utilized.

[Series 1: run · 2 acquisitions, 7 frames shown]
[im 1/2]
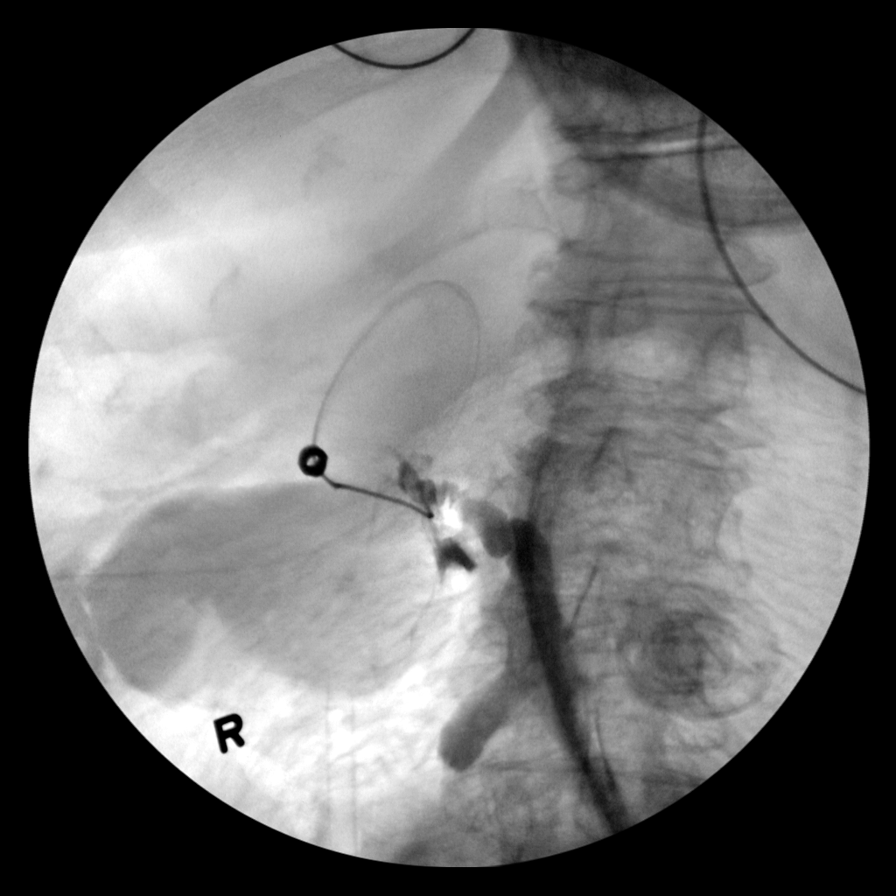
[im 1/2]
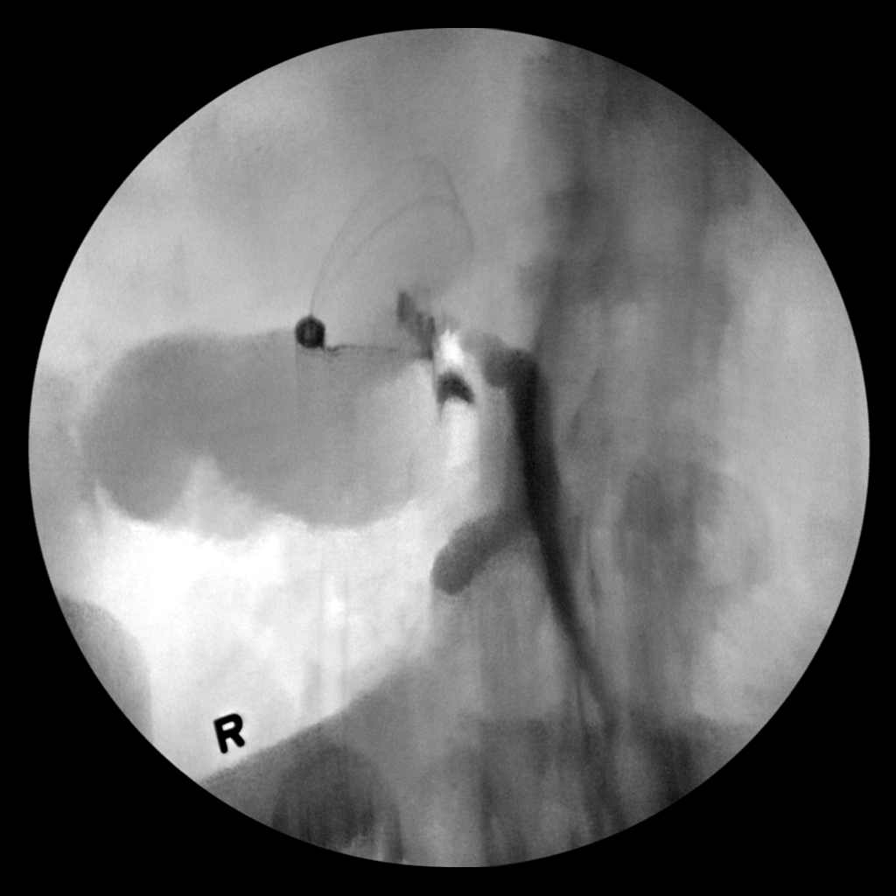
[im 1/2]
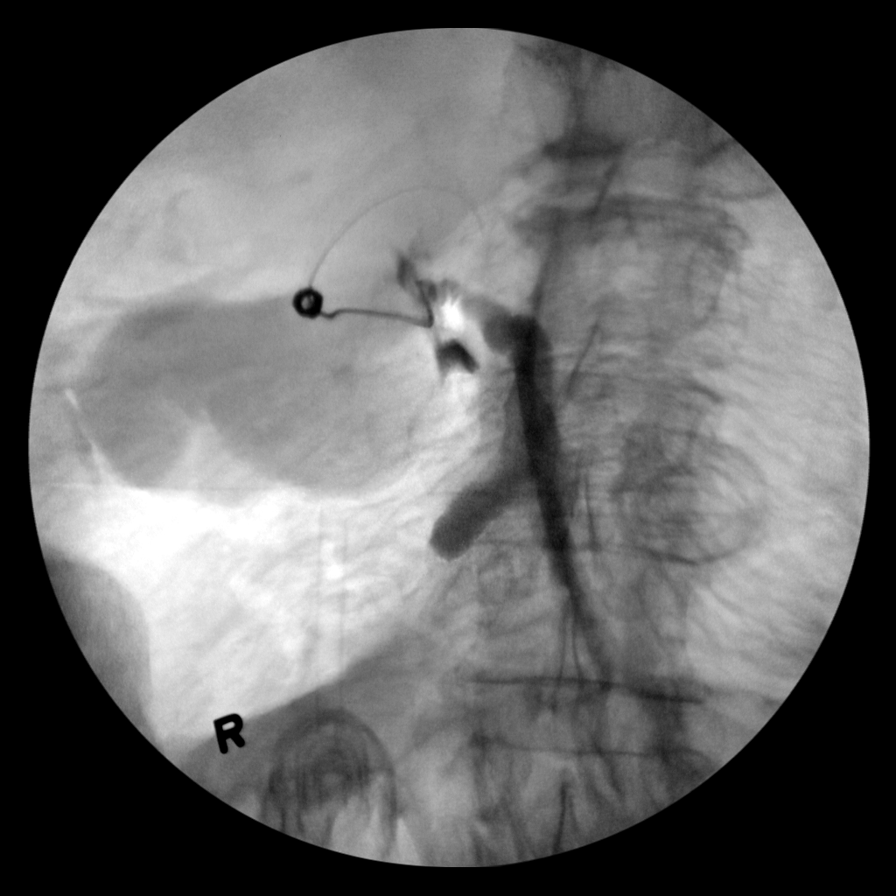
[im 2/2]
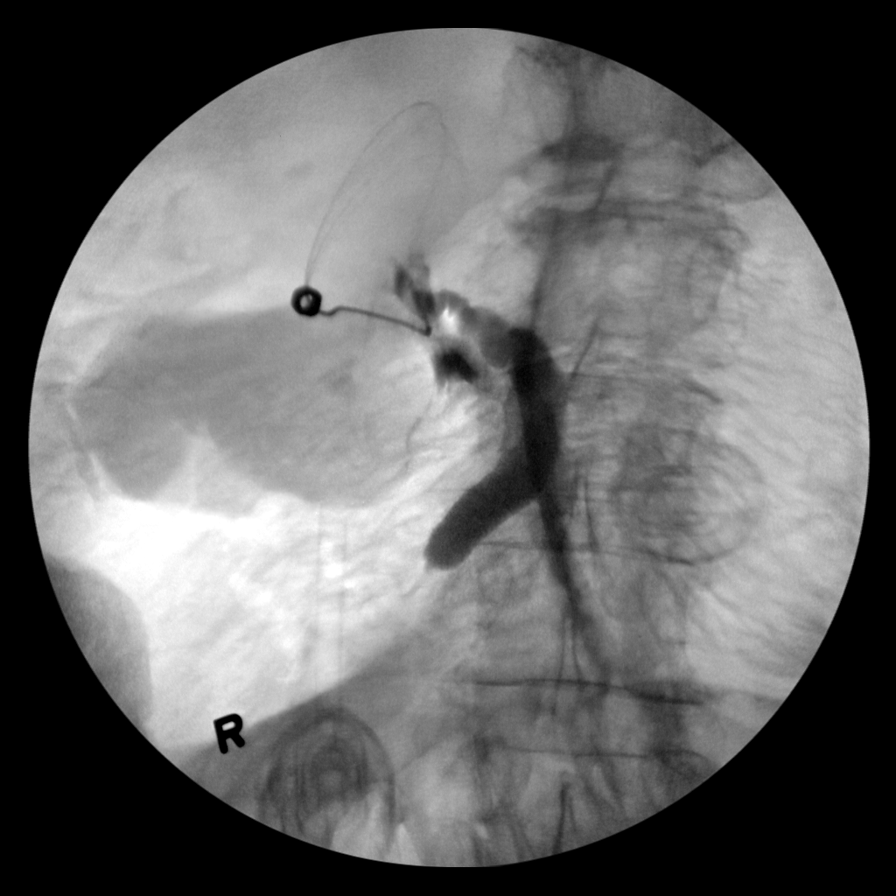
[im 2/2]
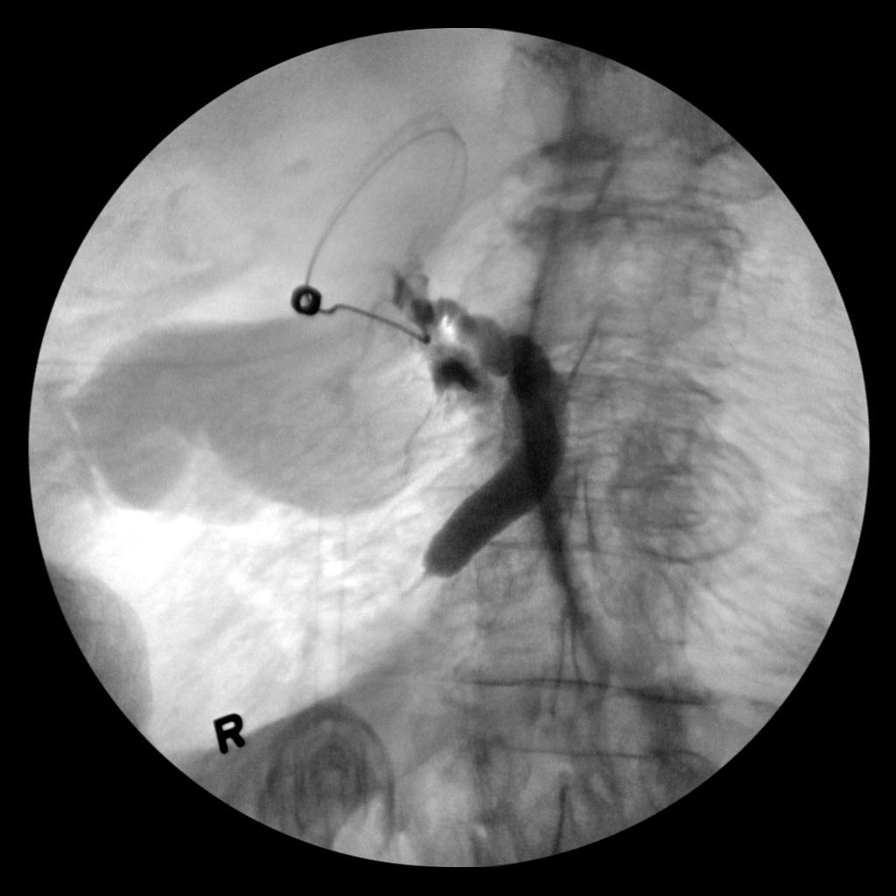
[im 2/2]
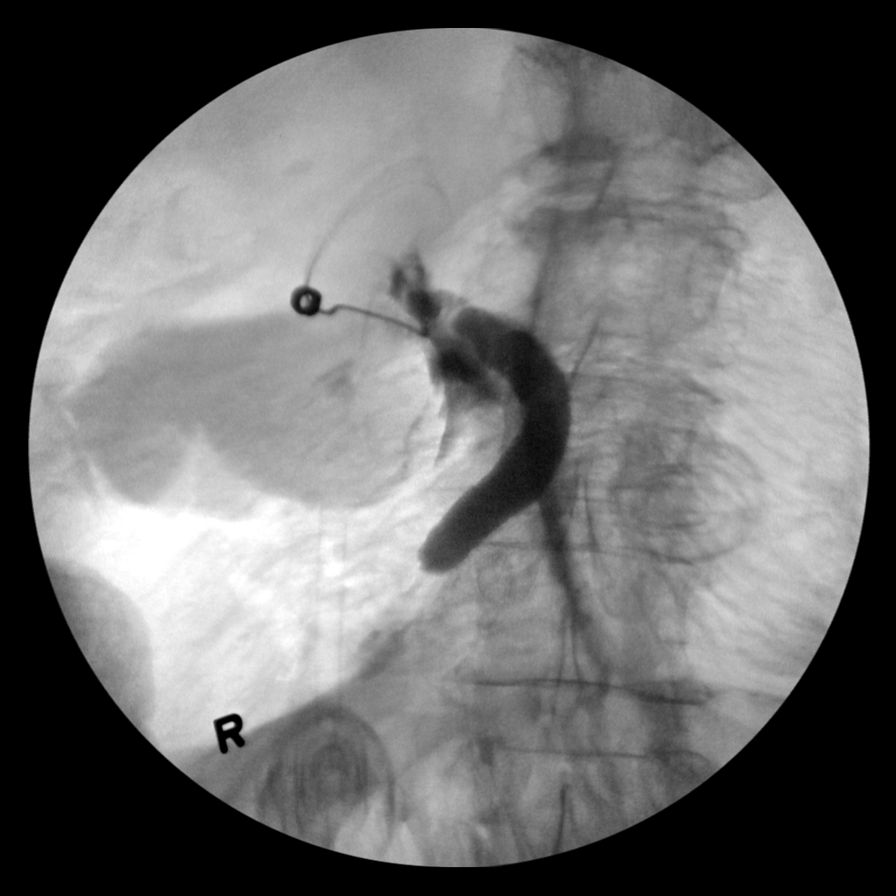
[im 2/2]
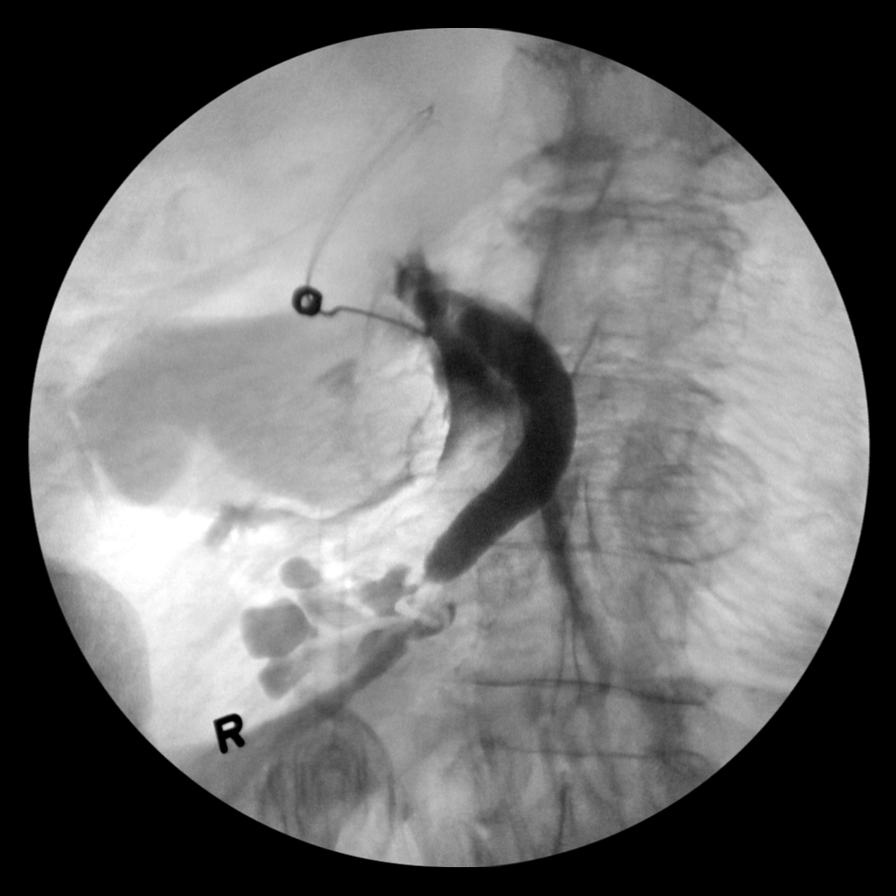

[7 of 7 positions shown; findings below may reference images not displayed]

FINDINGS: The cystic duct is cannulated. The common bile duct is dilated.
Moderate extravasation noted at the injection site. The 2nd series
shows spillage of contrast into the duodenum. No definite common
bile duct filling defects to suggest common bile duct stones.
IMPRESSION: Common bile duct dilatation but no definite filling defects to
suggest common bowel duct stones.

Contrast spillage into the duodenum.

Significant contrast extravasation at the injection site.

## 2014-10-28 ENCOUNTER — Encounter: Payer: Self-pay | Admitting: Internal Medicine

## 2014-10-28 DIAGNOSIS — R4182 Altered mental status, unspecified: Secondary | ICD-10-CM | POA: Diagnosis not present

## 2014-10-28 DIAGNOSIS — I499 Cardiac arrhythmia, unspecified: Secondary | ICD-10-CM | POA: Diagnosis not present

## 2014-10-28 LAB — VITAMIN B12: VITAMIN B 12: 352

## 2014-10-28 LAB — CBC AND DIFFERENTIAL
HEMATOCRIT: 47 — AB (ref 36–46)
Hemoglobin: 15.1 (ref 12.0–16.0)
PLATELETS: 175 (ref 150–399)
WBC: 10.4

## 2014-10-28 LAB — BASIC METABOLIC PANEL
BUN: 11 (ref 4–21)
Creatinine: 0.7 (ref 0.5–1.1)
Glucose: 99
Potassium: 4.2 (ref 3.4–5.3)
Sodium: 137 (ref 137–147)

## 2014-10-28 LAB — HEPATIC FUNCTION PANEL
ALK PHOS: 55 (ref 25–125)
ALT: 12 (ref 7–35)
AST: 19 (ref 13–35)
BILIRUBIN, TOTAL: 0.9

## 2014-10-28 LAB — TSH: TSH: 0.55 (ref 0.41–5.90)

## 2014-10-29 ENCOUNTER — Other Ambulatory Visit: Payer: Self-pay | Admitting: Family Medicine

## 2014-10-29 DIAGNOSIS — R4182 Altered mental status, unspecified: Secondary | ICD-10-CM

## 2014-10-29 DIAGNOSIS — I499 Cardiac arrhythmia, unspecified: Secondary | ICD-10-CM | POA: Diagnosis not present

## 2014-11-03 ENCOUNTER — Ambulatory Visit
Admission: RE | Admit: 2014-11-03 | Discharge: 2014-11-03 | Disposition: A | Discharge status: Home / Self Care | Payer: PRIVATE HEALTH INSURANCE | Source: Ambulatory Visit | Attending: Family Medicine | Admitting: Family Medicine

## 2014-11-03 DIAGNOSIS — R2689 Other abnormalities of gait and mobility: Secondary | ICD-10-CM | POA: Diagnosis not present

## 2014-11-03 DIAGNOSIS — R4182 Altered mental status, unspecified: Secondary | ICD-10-CM

## 2014-11-03 DIAGNOSIS — R410 Disorientation, unspecified: Secondary | ICD-10-CM | POA: Diagnosis not present

## 2014-11-10 ENCOUNTER — Ambulatory Visit: Payer: Self-pay | Admitting: Neurology

## 2014-12-03 ENCOUNTER — Ambulatory Visit (INDEPENDENT_AMBULATORY_CARE_PROVIDER_SITE_OTHER): Payer: Medicare Other | Admitting: Neurology

## 2014-12-03 ENCOUNTER — Encounter: Payer: Self-pay | Admitting: Neurology

## 2014-12-03 VITALS — BP 166/74 | HR 66 | Temp 97.9°F | Ht 62.0 in | Wt 153.0 lb

## 2014-12-03 DIAGNOSIS — R413 Other amnesia: Secondary | ICD-10-CM

## 2014-12-03 DIAGNOSIS — M6289 Other specified disorders of muscle: Secondary | ICD-10-CM | POA: Diagnosis not present

## 2014-12-03 DIAGNOSIS — R531 Weakness: Secondary | ICD-10-CM

## 2014-12-03 NOTE — Patient Instructions (Signed)
Overall you are doing fairly well but I do want to suggest a few things today:   Remember to drink plenty of fluid, eat healthy meals and do not skip any meals. Try to eat protein with a every meal and eat a healthy snack such as fruit or nuts in between meals. Try to keep a regular sleep-wake schedule and try to exercise daily, particularly in the form of walking, 20-30 minutes a day, if you can.   As far as your medications are concerned, I would like to suggest: None at this time  As far as diagnostic testing: None at this time  I would like to see you back as needed, sooner if we need to. Please call us with any interim questions, concerns, problems, updates or refill requests.   Please also call us for any test results so we can go over those with you on the phone.  My clinical assistant and will answer any of your questions and relay your messages to me and also relay most of my messages to you.   Our phone number is [phone]. We also have an after hours call service for urgent matters and there is a physician on-call for urgent questions. For any emergencies you know to call 911 or go to the nearest emergency room

## 2014-12-03 NOTE — Progress Notes (Signed)
[facility]    Provider:  Dr Lucia Barrera Referring Provider: Clayborn Heron, MD Primary Care Physician:  Lisa Fiedler, MD  CC:  Abnormal CT of the head  HPI:  Lisa Barrera is a 81 y.o. female here as a referral from Lisa. Barbaraann Barrera for dementia. PMHx hypothyroidism,memory loss. Son has noticed changes in personality. Patient gets fixated and anxious on things, she went to [facility] and they scheduled a CAT scan of the brain. She gets anxious at times, outbursts of frustration. Feels memory is not great, memory is worsening. She tells son the same things over and over. Son has patient writing notes of things she needs to remember. This is not disturbing to patient or son, functions well, lives at unassisted living in an apartment, she has lots of support. No safety issues, lots of support, good family. Son and patient just would like to review CT of the head, don't feel that anything else is too concerning to them. Patient's memory is worsening, she is having personality changes, but they do not feel follow up is necessary at this time. Reviewed imaging with patient, showed them images and explained findings. Denies weakness, dizziness, dysarthria, aphasia, dysphagia. Gait dysfunction, falls, tremors or any focal neurologic deficit.   Reviewed notes, labs and imaging from outside physicians, which showed: IMPRESSION: 1. Diffuse atrophy and moderate small vessel ischemic change. 2. Cannot exclude vague low-attenuation areas in the pons and midbrain. Recommend MRI if further assessment is warranted.  Personally reviewed and agree with findings. Agree cannot exclude vague low-attenuation areas in the pons and midbrain however there is motion artifact. There are chronic WM changes likely small-vessel ischemic changes.   TSH WNL, B12 WNL, Folate WNL, CBC unremarkable,   Review of Systems: Patient complains of symptoms per HPI as well as the following symptoms: anxiety, memory  loss, confusion. Pertinent negatives per HPI. All others negative.   History   Social History  . Marital Status: Widowed    Spouse Name: N/A  . Number of Children: 3  . Years of Education: College   Occupational History  . Retired    Social History Main Topics  . Smoking status: Never Smoker   . Smokeless tobacco: Not on file  . Alcohol Use: No  . Drug Use: No  . Sexual Activity: Not on file   Other Topics Concern  . Not on file   Social History Narrative   Lives at home by herself.   Right handed.    Caffeine use: Drinks 2 cups at breakfast in the morning    Family History  Problem Relation Age of Onset  . Multiple myeloma Mother   . Prostate cancer Father   . Heart disease Father     Past Medical History  Diagnosis Date  . Hypothyroid   . Eczema   . Vitamin D deficiency     Past Surgical History  Procedure Laterality Date  . Tonsillectomy    . Cholecystectomy N/A 06/24/2013    Procedure: LAPAROSCOPIC CHOLECYSTECTOMY WITH INTRAOPERATIVE CHOLANGIOGRAM;  Surgeon: Lisa Filler, MD;  Location: [facility] OR;  Service: General;  Laterality: N/A;    Current Outpatient Prescriptions  Medication Sig Dispense Refill  . levothyroxine (SYNTHROID, LEVOTHROID) 88 MCG tablet Take 88 mcg by mouth daily before breakfast.     No current facility-administered medications for this visit.    Allergies as of 12/03/2014  . (No Known Allergies)    Vitals: BP 166/74 mmHg  Pulse 66  Temp(Src) 97.9 F (36.6 C)  Ht '5\' 2"'$  (1.575 m)  Wt 153 lb (69.4 kg)  BMI 27.98 kg/m2 Last Weight:  Wt Readings from Last 1 Encounters:  12/03/14 153 lb (69.4 kg)   Last Height:   Ht Readings from Last 1 Encounters:  12/03/14 $RemoveB'5\' 2"'fnMCkinI$  (1.575 m)   Physical exam: Exam: Gen: NAD, conversant, well nourised, obese, well groomed                     CV: RRR, no MRG. No Carotid Bruits. No peripheral edema, warm, nontender Eyes: Conjunctivae clear without exudates or hemorrhage  Neuro: Detailed  Neurologic Exam  Speech:    Speech is normal; fluent and spontaneous with normal comprehension.  Cognition:    The patient is oriented to person, place, and time;     recent memory impaired and remote memory intact;     language fluent;     normal attention, concentration,     Grossly intact fund of knowledge Cranial Nerves:    The pupils are equal, round, and reactive to light. The fundi are flat. Visual fields are full to finger waving. Extraocular movements are intact. Trigeminal sensation is intact and the muscles of mastication are normal. The face is symmetric. The palate elevates in the midline. Hearing intact. Voice is normal. Shoulder shrug is normal. The tongue has normal motion without fasciculations.   Coordination:    Normal finger to nose and heel to shin. Normal rapid alternating movements.   Gait:    No ataxia  Motor Observation:    No asymmetry, no atrophy, and no involuntary movements noted. Tone:    Normal muscle tone.    Posture:    Posture is normal. normal erect    Strength:     prox right-sided weakness      Sensation: intact to LT     Reflex Exam:  DTR's:    Absent achilles otherwise eep tendon reflexes in the upper and lower extremities are brisk bilaterally.   Toes:    The toes are mute bilaterally.   Clonus:    Clonus is absent.    Assessment/Plan:    Lisa Barrera is a 81 y.o. female here as a referral from Lisa. Radene Barrera for dementia. Son has noticed changes in personality. Patient gets fixated and anxious on things. Exam with right-sided proximal weakness that patient and son were not aware of. CT of the head with diffuse atrophy, cannot exclude vague low-attenuation areas in the pons and midbrain however there is motion artifact. There are chronic WM changes likely small-vessel ischemic changes.    Patient declines MRI of the brain or further workup, declined Aricept or memory testing. Did convince them to follow up with their pcp for  physical and workup. Agreed to take ASA $Remove'81mg'OQjWJoE$  daily for stroke prevention given likely small-vessel ischemic disease.  Needs to follow with Lisa. Radene Barrera for management of BP (166/74 in the office today), needs cholesteral and diabetes evaluation, close follow up for vascular risk factors Encouraged follow up with me if they feel memory worsening, new weakness or other focal neurologic deficits They should proceed directly to the ER should she experience any stroke-like symptoms.    Sarina Ill, MD  [facility] [address] [place], [place] [address]  Phone [phone] Fax [phone]

## 2015-02-03 DIAGNOSIS — E559 Vitamin D deficiency, unspecified: Secondary | ICD-10-CM | POA: Diagnosis not present

## 2015-02-03 DIAGNOSIS — E039 Hypothyroidism, unspecified: Secondary | ICD-10-CM | POA: Diagnosis not present

## 2015-02-03 DIAGNOSIS — R5383 Other fatigue: Secondary | ICD-10-CM | POA: Diagnosis not present

## 2015-02-03 LAB — VITAMIN D 25 HYDROXY (VIT D DEFICIENCY, FRACTURES): Vit D, 25-Hydroxy: 22.3

## 2015-02-03 LAB — TSH: TSH: 0.65 (ref 0.41–5.90)

## 2015-06-02 DIAGNOSIS — E039 Hypothyroidism, unspecified: Secondary | ICD-10-CM | POA: Diagnosis not present

## 2015-06-02 DIAGNOSIS — E559 Vitamin D deficiency, unspecified: Secondary | ICD-10-CM | POA: Diagnosis not present

## 2015-06-02 DIAGNOSIS — Z0000 Encounter for general adult medical examination without abnormal findings: Secondary | ICD-10-CM | POA: Diagnosis not present

## 2015-06-02 LAB — HEPATIC FUNCTION PANEL
ALT: 10 (ref 7–35)
ALT: 10 (ref 7–35)
AST: 15 (ref 13–35)
AST: 15 (ref 13–35)
Alkaline Phosphatase: 64 (ref 25–125)
Alkaline Phosphatase: 64 (ref 25–125)
Bilirubin, Total: 1
Bilirubin, Total: 1

## 2015-06-02 LAB — BASIC METABOLIC PANEL
BUN: 13 (ref 4–21)
BUN: 13 (ref 4–21)
CREATININE: 0.7 (ref 0.5–1.1)
Creatinine: 0.7 (ref 0.5–1.1)
GLUCOSE: 90
Glucose: 90
POTASSIUM: 3.8 (ref 3.4–5.3)
POTASSIUM: 3.8 (ref 3.4–5.3)
SODIUM: 140 (ref 137–147)
Sodium: 140 (ref 137–147)

## 2015-06-02 LAB — TSH: TSH: 0.76 (ref 0.41–5.90)

## 2015-06-02 LAB — VITAMIN D 25 HYDROXY (VIT D DEFICIENCY, FRACTURES): VIT D 25 HYDROXY: 45

## 2015-06-05 LAB — TSH: TSH: 0.76 (ref 0.41–5.90)

## 2015-06-10 DIAGNOSIS — Z23 Encounter for immunization: Secondary | ICD-10-CM | POA: Diagnosis not present

## 2016-03-20 DIAGNOSIS — L989 Disorder of the skin and subcutaneous tissue, unspecified: Secondary | ICD-10-CM | POA: Diagnosis not present

## 2016-03-20 DIAGNOSIS — E039 Hypothyroidism, unspecified: Secondary | ICD-10-CM | POA: Diagnosis not present

## 2016-03-20 LAB — TSH: TSH: 0.29 — AB (ref 0.41–5.90)

## 2016-06-21 DIAGNOSIS — L821 Other seborrheic keratosis: Secondary | ICD-10-CM | POA: Diagnosis not present

## 2016-06-21 DIAGNOSIS — D1801 Hemangioma of skin and subcutaneous tissue: Secondary | ICD-10-CM | POA: Diagnosis not present

## 2016-06-21 DIAGNOSIS — L814 Other melanin hyperpigmentation: Secondary | ICD-10-CM | POA: Diagnosis not present

## 2016-06-22 DIAGNOSIS — Z23 Encounter for immunization: Secondary | ICD-10-CM | POA: Diagnosis not present

## 2016-09-25 DIAGNOSIS — E039 Hypothyroidism, unspecified: Secondary | ICD-10-CM | POA: Diagnosis not present

## 2016-09-25 DIAGNOSIS — M8588 Other specified disorders of bone density and structure, other site: Secondary | ICD-10-CM | POA: Diagnosis not present

## 2016-09-25 DIAGNOSIS — Z0000 Encounter for general adult medical examination without abnormal findings: Secondary | ICD-10-CM | POA: Diagnosis not present

## 2016-09-25 LAB — HEPATIC FUNCTION PANEL
ALT: 9 (ref 7–35)
ALT: 9 (ref 7–35)
AST: 17 (ref 13–35)
AST: 17 (ref 13–35)
Alkaline Phosphatase: 52 (ref 25–125)
Alkaline Phosphatase: 52 (ref 25–125)
BILIRUBIN, TOTAL: 1.5
Bilirubin, Total: 1.5

## 2016-09-25 LAB — BASIC METABOLIC PANEL
BUN: 12 (ref 4–21)
BUN: 12 (ref 4–21)
Creatinine: 0.7 (ref 0.5–1.1)
Creatinine: 0.7 (ref 0.5–1.1)
GLUCOSE: 93
Glucose: 93
POTASSIUM: 4.4 (ref 3.4–5.3)
Potassium: 4.4 (ref 3.4–5.3)
SODIUM: 137 (ref 137–147)
Sodium: 137 (ref 137–147)

## 2016-09-26 LAB — TSH: TSH: 5.23 (ref 0.41–5.90)

## 2017-05-15 ENCOUNTER — Encounter: Payer: Medicare Other | Admitting: Internal Medicine

## 2017-05-22 ENCOUNTER — Encounter: Payer: Self-pay | Admitting: Internal Medicine

## 2017-05-22 ENCOUNTER — Ambulatory Visit: Payer: Medicare Other | Admitting: Internal Medicine

## 2017-05-22 VITALS — BP 132/70 | HR 70 | Temp 98.5°F | Resp 16 | Ht 61.0 in | Wt 153.2 lb

## 2017-05-22 DIAGNOSIS — E559 Vitamin D deficiency, unspecified: Secondary | ICD-10-CM | POA: Diagnosis not present

## 2017-05-22 DIAGNOSIS — R413 Other amnesia: Secondary | ICD-10-CM

## 2017-05-22 DIAGNOSIS — E039 Hypothyroidism, unspecified: Secondary | ICD-10-CM

## 2017-05-22 MED ORDER — ASPIRIN EC 81 MG PO TBEC: 81.0000 mg | DELAYED_RELEASE_TABLET | Freq: Every day | ORAL | 3 refills | Status: DC

## 2017-05-22 NOTE — Progress Notes (Signed)
[facility]  Provider: Blanchie Serve MD   Location:      Place of Service:     PCP: Aretta Nip, MD Patient Care Team: Rankins, Bill Salinas, MD as PCP - General [facility])  Extended Emergency Contact Information Primary Emergency Contact: Suppes,[name] Address: [address]          [place], [place] [address] [name] of [place] Phone: [phone] Mobile Phone: [phone] Relation: Son Secondary Emergency Contact: Macrae,Brett Address: [address]          [place], [place] [address] [name] of [place] Phone: [phone] Work Phone: [phone] Mobile Phone: [phone] Relation: Son   Goals of Care: Advanced Directive information Advanced Directives 06/24/2013  Does Patient Have a Medical Advance Directive? Patient would like information      Chief Complaint  Patient presents with  . New Patient (Initial Visit)    Establishing care with [facility]  . Medication Refill    No refills needed at this time    HPI: Patient is a 83 y.o. female seen today to establish care. She has medical history of hypothyroidism. She takes levothyroxine 75 mcg daily and is tolerating it well. She was seeing Dr Radene Ou with [facility] prior to this. She was last seen couple of years back. She denies any health concern today. She has eczema and applies lotion generously when possible. She has history of vitamin d deficiency and is not on any supplement at present.   Past Medical History:  Diagnosis Date  . Eczema   . Hypothyroid   . Hypothyroidism   . Vitamin D deficiency    Past Surgical History:  Procedure Laterality Date  . CHOLECYSTECTOMY N/A 06/24/2013   Procedure: LAPAROSCOPIC CHOLECYSTECTOMY WITH INTRAOPERATIVE CHOLANGIOGRAM;  Surgeon: Ralene Ok, MD;  Location: [facility];  Service: General;  Laterality: N/A;  . TONSILLECTOMY      reports that she has never smoked. She has never used  smokeless tobacco. She reports that she does not drink alcohol or use drugs. Social History   Social History  . Marital status: Widowed    Spouse name: N/A  . Number of children: 3  . Years of education: College   Occupational History  . Retired    Social History Main Topics  . Smoking status: Never Smoker  . Smokeless tobacco: Never Used  . Alcohol use No  . Drug use: No  . Sexual activity: Not on file   Other Topics Concern  . Not on file   Social History Narrative   Tobacco use, amount per day now: None      Past tobacco use, amount per day: None      How many years did you use tobacco: Never      Alcohol use (drinks per week): None      Diet: 2 meals a day at [facility]      Do you drink/eat things with caffeine? Coffee      Marital status: Widowed             What year were you married? 1952      Do you live in a house, apartment, assisted living, condo, trailer? Apartment      Is it one or more stories? 1      How many persons live in your home?      Do you have any pets in your home? 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 Yes           How often? Weekly and daily classes. Does walking on her own.      Do you have a living will? Yes      Do you have a DNR form?   Yes         If not, do you want to discuss one?      Do you have signed POA/HPOA forms? Yes                  Functional Status Survey:    Family History  Problem Relation Age of Onset  . Multiple myeloma Mother   . Prostate cancer Father   . Heart disease Father     Health Maintenance  Topic Date Due  . TETANUS/TDAP  04/19/1947  . DEXA SCAN  04/18/1993  . PNA vac Low Risk Adult (2 of 2 - PCV13) 06/25/2014  . INFLUENZA VACCINE  04/11/2017    No Known Allergies  Outpatient Encounter Prescriptions as of 05/22/2017  Medication Sig  . levothyroxine (SYNTHROID, LEVOTHROID) 75 MCG tablet Take 75 mcg by mouth daily before  breakfast.  . [DISCONTINUED] levothyroxine (SYNTHROID, LEVOTHROID) 88 MCG tablet Take 88 mcg by mouth daily before breakfast.   No facility-administered encounter medications on file as of 05/22/2017.     Review of Systems  Constitutional: Negative for appetite change, chills, diaphoresis, fatigue, fever and unexpected weight change.  HENT: Negative for ear discharge, ear pain, hearing loss, mouth sores, rhinorrhea, sinus pain, sinus pressure, sore throat and trouble swallowing.   Eyes: Negative for pain, redness, itching and visual disturbance.       Has not seen an eye doctor in a while  Respiratory: Negative for cough, chest tightness, shortness of breath and wheezing.   Cardiovascular: Negative for chest pain, palpitations and leg swelling.  Gastrointestinal: Negative for abdominal pain, anal bleeding, blood in stool, constipation, diarrhea, nausea, rectal pain and vomiting.  Genitourinary: Positive for frequency. Negative for dysuria, hematuria and pelvic pain.       Has nocturia and needs to wake up 2 times at night  Musculoskeletal: Negative for arthralgias, back pain, gait problem and joint swelling.  Skin: Negative for rash and wound.  Neurological: Negative for dizziness, tremors, seizures, syncope, weakness and headaches.  Hematological: Does not bruise/bleed easily.  Psychiatric/Behavioral: Negative for behavioral problems, confusion, hallucinations and sleep disturbance. The patient is not nervous/anxious.     Vitals:   05/22/17 1032  BP: 132/70  Pulse: 70  Resp: 16  Temp: 98.5 F (36.9 C)  TempSrc: Oral  SpO2: 95%  Weight: 153 lb 3.2 oz (69.5 kg)  Height: '5\' 1"'$  (1.549 m)   Body mass index is 28.95 kg/m. Physical Exam  Constitutional: She is oriented to person, place, and time. She appears well-developed. No distress.  overweight  HENT:  Head: Normocephalic and atraumatic.  Right Ear: External ear normal.  Left Ear: External ear normal.  Nose: Nose normal.    Mouth/Throat: Oropharynx is clear and moist. No oropharyngeal exudate.  Eyes: Pupils are equal, round, and reactive to light. Conjunctivae and EOM are normal. Right eye exhibits no discharge. Left eye exhibits no discharge. No scleral icterus.  Neck: Normal range of motion. Neck supple. No JVD present. No thyromegaly present.  Cardiovascular: Normal rate and regular rhythm.   No murmur heard. Pulmonary/Chest: Effort normal and breath sounds normal. No  respiratory distress. She has no wheezes. She has no rales. She exhibits no tenderness.  Abdominal: Soft. Bowel sounds are normal. She exhibits no mass. There is no tenderness. There is no rebound and no guarding.  Musculoskeletal: Normal range of motion. She exhibits no edema.  Lymphadenopathy:    She has no cervical adenopathy.  Neurological: She is alert and oriented to person, place, and time.  Skin: Skin is warm and dry. No rash noted. She is not diaphoretic. No erythema.  Psychiatric: She has a normal mood and affect. Her behavior is normal.    Labs reviewed: Basic Metabolic Panel: No results for input(s): NA, K, CL, CO2, GLUCOSE, BUN, CREATININE, CALCIUM, MG, PHOS in the last 8760 hours. Liver Function Tests: No results for input(s): AST, ALT, ALKPHOS, BILITOT, PROT, ALBUMIN in the last 8760 hours. No results for input(s): LIPASE, AMYLASE in the last 8760 hours. No results for input(s): AMMONIA in the last 8760 hours. CBC: No results for input(s): WBC, NEUTROABS, HGB, HCT, MCV, PLT in the last 8760 hours. Cardiac Enzymes: No results for input(s): CKTOTAL, CKMB, CKMBINDEX, TROPONINI in the last 8760 hours. BNP: Invalid input(s): POCBNP No results found for: HGBA1C No results found for: TSH No results found for: VITAMINB12 No results found for: FOLATE No results found for: IRON, TIBC, FERRITIN  Lipid Panel: No results for input(s): CHOL, HDL, LDLCALC, TRIG, CHOLHDL, LDLDIRECT in the last 8760 hours. No results found for:  HGBA1C  Procedures since last visit: No results found.  Assessment/Plan  1. Acquired hypothyroidism Continue levothyroxine 75 mcg daily. Check thyroid panel - CMP with eGFR; Future - Lipid Panel; Future - TSH; Future - CBC (no diff); Future  2. Vitamin D deficiency Check vitamin d level.  - Vitamin D, 1,25-dihydroxy; Future  3. Memory loss Reviewed neurology note from 2016, CT head showed atrophy of brain. She has declined aricept and MRI brain in past. Due to small vessel ischemic changes on CT head, aspirin was prescribed but she has not been taking it. Obtain MMSE next visit. Start baby aspirin with concern for vascular dementia.  - TSH; Future    Labs/tests ordered:  As above  Next appointment: 1 month for memory loss workup  Communication: reviewed care plan with patient   Blanchie Serve, MD Internal Medicine [place], [place] [address] Cell Phone (Monday-Friday 8 am - 5 pm): [phone] On Call: [phone] and follow prompts after 5 pm and on weekends Office Phone: [phone] Office Fax: [phone]

## 2017-06-19 DIAGNOSIS — E039 Hypothyroidism, unspecified: Secondary | ICD-10-CM | POA: Diagnosis not present

## 2017-06-19 DIAGNOSIS — E559 Vitamin D deficiency, unspecified: Secondary | ICD-10-CM | POA: Diagnosis not present

## 2017-06-19 DIAGNOSIS — R413 Other amnesia: Secondary | ICD-10-CM | POA: Diagnosis not present

## 2017-06-20 DIAGNOSIS — Z23 Encounter for immunization: Secondary | ICD-10-CM | POA: Diagnosis not present

## 2017-06-26 ENCOUNTER — Encounter: Payer: Self-pay | Admitting: Internal Medicine

## 2017-06-26 ENCOUNTER — Non-Acute Institutional Stay: Payer: Medicare Other | Admitting: Internal Medicine

## 2017-06-26 VITALS — BP 126/72 | HR 65 | Temp 98.1°F | Resp 18 | Ht 61.0 in | Wt 159.2 lb

## 2017-06-26 DIAGNOSIS — E559 Vitamin D deficiency, unspecified: Secondary | ICD-10-CM | POA: Diagnosis not present

## 2017-06-26 DIAGNOSIS — F0150 Vascular dementia without behavioral disturbance: Secondary | ICD-10-CM | POA: Diagnosis not present

## 2017-06-26 DIAGNOSIS — E039 Hypothyroidism, unspecified: Secondary | ICD-10-CM

## 2017-06-26 LAB — CBC
HCT: 44.6 % (ref 35.0–45.0)
Hemoglobin: 15.2 g/dL (ref 11.7–15.5)
MCH: 32.9 pg (ref 27.0–33.0)
MCHC: 34.1 g/dL (ref 32.0–36.0)
MCV: 96.5 fL (ref 80.0–100.0)
MPV: 9.9 fL (ref 7.5–12.5)
PLATELETS: 172 10*3/uL (ref 140–400)
RBC: 4.62 10*6/uL (ref 3.80–5.10)
RDW: 13.2 % (ref 11.0–15.0)
WBC: 7.6 10*3/uL (ref 3.8–10.8)

## 2017-06-26 LAB — COMPLETE METABOLIC PANEL WITH GFR
AG Ratio: 1.5 (calc) (ref 1.0–2.5)
ALBUMIN MSPROF: 3.6 g/dL (ref 3.6–5.1)
ALT: 10 U/L (ref 6–29)
AST: 14 U/L (ref 10–35)
Alkaline phosphatase (APISO): 51 U/L (ref 33–130)
BUN: 11 mg/dL (ref 7–25)
CO2: 22 mmol/L (ref 20–32)
Calcium: 8.8 mg/dL (ref 8.6–10.4)
Chloride: 103 mmol/L (ref 98–110)
Creat: 0.64 mg/dL (ref 0.60–0.88)
GFR, EST AFRICAN AMERICAN: 92 mL/min/{1.73_m2} (ref >=60)
GFR, EST NON AFRICAN AMERICAN: 79 mL/min/{1.73_m2} (ref >=60)
GLOBULIN: 2.4 g/dL (ref 1.9–3.7)
Glucose, Bld: 117 mg/dL — ABNORMAL HIGH (ref 65–99)
POTASSIUM: 4.1 mmol/L (ref 3.5–5.3)
Sodium: 134 mmol/L — ABNORMAL LOW (ref 135–146)
Total Bilirubin: 1.3 mg/dL — ABNORMAL HIGH (ref 0.2–1.2)
Total Protein: 6 g/dL — ABNORMAL LOW (ref 6.1–8.1)

## 2017-06-26 LAB — TSH: TSH: 3.28 m[IU]/L (ref 0.40–4.50)

## 2017-06-26 LAB — VITAMIN D 1,25 DIHYDROXY
VITAMIN D 1, 25 (OH) TOTAL: 48 pg/mL (ref 18–72)
Vitamin D2 1, 25 (OH)2: 9 pg/mL
Vitamin D3 1, 25 (OH)2: 39 pg/mL

## 2017-06-26 LAB — LIPID PANEL
Cholesterol: 185 mg/dL (ref <200)
HDL: 67 mg/dL (ref >50)
LDL Cholesterol (Calc): 99 mg/dL (calc)
Non-HDL Cholesterol (Calc): 118 mg/dL (calc) (ref <130)
Total CHOL/HDL Ratio: 2.8 (calc) (ref <5.0)
Triglycerides: 97 mg/dL (ref <150)

## 2017-06-26 MED ORDER — DONEPEZIL HCL 5 MG PO TABS: 5.0000 mg | ORAL_TABLET | Freq: Every day | ORAL | 3 refills | Status: DC

## 2017-06-26 MED ORDER — LEVOTHYROXINE SODIUM 75 MCG PO TABS: 75.0000 ug | ORAL_TABLET | Freq: Every day | ORAL | 3 refills | Status: DC

## 2017-06-26 MED ORDER — CALCIUM CARBONATE-VITAMIN D 500-200 MG-UNIT PO TABS: 1.0000 | ORAL_TABLET | Freq: Two times a day (BID) | ORAL | 3 refills | Status: DC

## 2017-06-26 NOTE — Patient Instructions (Signed)
  Start taking donepezil 5 mg daily at bedtime for your memory. Also start taking calcium and vitamin D supplement to help with your bones :)

## 2017-06-26 NOTE — Progress Notes (Signed)
[facility]  Provider: Blanchie Serve MD   Location:  [place] of Service:  Clinic (12)  PCP: Rankins, Bill Salinas, MD Patient Care Team: Rankins, Bill Salinas, MD as PCP - General [facility])  Extended Emergency Contact Information Primary Emergency Contact: Vanzandt,[name] Address: [address]          [place], [place] [address] [name] of [place] Phone: [phone] Mobile Phone: [phone] Relation: Son Secondary Emergency Contact: Thresher,Brett Address: [address]          [place], [place] [address] [name] of [place] Phone: [phone] Work Phone: [phone] Mobile Phone: [phone] Relation: Son   Goals of Care: Advanced Directive information Advanced Directives 06/24/2013  Does Patient Have a Medical Advance Directive? Patient would like information      Chief Complaint  Patient presents with  . Medical Management of Chronic Issues    1 month follow up. No concerns at this time.  . Medication Refill    Levothyroxine (order signed)  . Results    Discuss labs.  . Other    MMSE 24/30. Passed clock drawing    HPI: Patient is a 83 y.o. female seen today for memory issue. She is currently on levothyroxine for hypothyroidism. Stable TSH on lab review. Reviewed neurology note from 2016, CT head showed atrophy of brain and ischemic changes. She has declined aricept and MRI brain in past. Currently taking baby aspirin. Patient mentions being more forgetful and having problem recalling things.     Past Medical History:  Diagnosis Date  . Eczema   . Hypothyroid   . Hypothyroidism   . Vitamin D deficiency    Past Surgical History:  Procedure Laterality Date  . CHOLECYSTECTOMY N/A 06/24/2013   Procedure: LAPAROSCOPIC CHOLECYSTECTOMY WITH INTRAOPERATIVE CHOLANGIOGRAM;  Surgeon: Ralene Ok, MD;  Location: [facility];  Service: General;  Laterality: N/A;  . TONSILLECTOMY      reports  that she has never smoked. She has never used smokeless tobacco. She reports that she does not drink alcohol or use drugs. Social History   Social History  . Marital status: Widowed    Spouse name: N/A  . Number of children: 3  . Years of education: College   Occupational History  . Retired    Social History Main Topics  . Smoking status: Never Smoker  . Smokeless tobacco: Never Used  . Alcohol use No  . Drug use: No  . Sexual activity: Not on file   Other Topics Concern  . Not on file   Social History Narrative   Tobacco use, amount per day now: None      Past tobacco use, amount per day: None      How many years did you use tobacco: Never      Alcohol use (drinks per week): None      Diet: 2 meals a day at [facility]      Do you drink/eat things with caffeine? Coffee      Marital status: Widowed             What year were you married? 1952      Do you live in a house, apartment, assisted living, condo, trailer? Apartment      Is it one or more stories? 1      How many persons live in your home?      Do you have any pets in your home? 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 Yes           How often? Weekly and daily classes. Does walking on her own.      Do you have a living will? Yes      Do you have a DNR form?   Yes         If not, do you want to discuss one?      Do you have signed POA/HPOA forms? Yes                  Functional Status Survey:    Family History  Problem Relation Age of Onset  . Multiple myeloma Mother   . Prostate cancer Father   . Heart disease Father     Health Maintenance  Topic Date Due  . TETANUS/TDAP  04/19/1947  . DEXA SCAN  04/18/1993  . PNA vac Low Risk Adult (2 of 2 - PCV13) 06/25/2014  . INFLUENZA VACCINE  Completed    No Known Allergies  Outpatient Encounter Prescriptions as of 06/26/2017  Medication Sig  . aspirin EC 81 MG tablet Take 1 tablet (81 mg  total) by mouth daily.  Marland Kitchen levothyroxine (SYNTHROID, LEVOTHROID) 75 MCG tablet Take 1 tablet (75 mcg total) by mouth daily before breakfast.  . [DISCONTINUED] levothyroxine (SYNTHROID, LEVOTHROID) 75 MCG tablet Take 75 mcg by mouth daily before breakfast.   No facility-administered encounter medications on file as of 06/26/2017.     Review of Systems  Constitutional: Negative for appetite change, chills, diaphoresis, fatigue and fever.  HENT: Negative for congestion, ear pain, hearing loss, sore throat and trouble swallowing.   Eyes: Negative for visual disturbance.  Respiratory: Negative for cough and shortness of breath.   Cardiovascular: Negative for chest pain and palpitations.  Gastrointestinal: Negative for abdominal pain, constipation and diarrhea.  Genitourinary: Negative for dysuria.       Has nocturia   Musculoskeletal: Negative for gait problem.  Skin: Negative for rash and wound.  Neurological: Negative for dizziness, tremors and headaches.  Psychiatric/Behavioral: Positive for confusion and decreased concentration. Negative for behavioral problems, hallucinations and sleep disturbance. The patient is not nervous/anxious.     Vitals:   06/26/17 1116  BP: 126/72  Pulse: 65  Resp: 18  Temp: 98.1 F (36.7 C)  TempSrc: Oral  SpO2: 96%  Weight: 159 lb 3.2 oz (72.2 kg)  Height: '5\' 1"'$  (1.549 m)   Body mass index is 30.08 kg/m.   Wt Readings from Last 3 Encounters:  06/26/17 159 lb 3.2 oz (72.2 kg)  05/22/17 153 lb 3.2 oz (69.5 kg)  12/03/14 153 lb (69.4 kg)   Physical Exam  Constitutional: She is oriented to person, place, and time. She appears well-developed. No distress.  overweight  HENT:  Head: Normocephalic and atraumatic.  Mouth/Throat: Oropharynx is clear and moist.  Eyes: Pupils are equal, round, and reactive to light. Conjunctivae and EOM are normal.  Neck: Normal range of motion. Neck supple. No thyromegaly present.  Cardiovascular: Normal rate and  regular rhythm.   No murmur heard. Pulmonary/Chest: Effort normal and breath sounds normal. She has no wheezes.  Abdominal: Soft. Bowel sounds are normal. She exhibits no distension. There is no rebound.  Musculoskeletal: Normal range of motion. She exhibits no edema.  Lymphadenopathy:    She has no cervical adenopathy.  Neurological: She is alert and oriented to person, place, and time.  06/26/17 MMSE 24/30,  PASSED clock draw.  Skin: Skin is warm and dry. No rash noted. She is not diaphoretic. No erythema.  Psychiatric: She has a normal mood and affect. Her behavior is normal.    Labs reviewed: Basic Metabolic Panel:  Recent Labs  06/19/17 0000  NA 134*  K 4.1  CL 103  CO2 22  GLUCOSE 117*  BUN 11  CREATININE 0.64  CALCIUM 8.8   Liver Function Tests:  Recent Labs  06/19/17 0000  AST 14  ALT 10  BILITOT 1.3*  PROT 6.0*   No results for input(s): LIPASE, AMYLASE in the last 8760 hours. No results for input(s): AMMONIA in the last 8760 hours. CBC:  Recent Labs  06/19/17 0000  WBC 7.6  HGB 15.2  HCT 44.6  MCV 96.5  PLT 172   No results found for: HGBA1C Lab Results  Component Value Date   TSH 3.28 06/19/2017   No results found for: VITAMINB12 No results found for: FOLATE No results found for: IRON, TIBC, FERRITIN  Lipid Panel:  Recent Labs  06/19/17 0000  CHOL 185  HDL 67  TRIG 97  CHOLHDL 2.8   No results found for: HGBA1C  Procedures since last visit: No results found.  Assessment/Plan  1. Vitamin D deficiency Low normal vitamin d level. No fall reported. Start oscal-vitamin d 500-400 bid.  - calcium-vitamin D (OSCAL 500/200 D-3) 500-200 MG-UNIT tablet; Take 1 tablet by mouth 2 (two) times daily.  Dispense: 180 tablet; Refill: 3  2. Vascular dementia without behavioral disturbance MMSE 24/30. Passed clock draw. Agrees to start donepezil. - donepezil (ARICEPT) 5 MG tablet; Take 1 tablet (5 mg total) by mouth at bedtime.  Dispense: 90  tablet; Refill: 3  3. Acquired hypothyroidism Continue levothyroxine 75 mcg daily. Reviewed thyroid panel. Refill provided. - BMP with eGFR; Future - TSH; Future   Labs/tests ordered:  As above  Next appointment: 6 month follow up  Communication: reviewed care plan with patient   Blanchie Serve, MD Internal Medicine [place], [place] [address] Cell Phone (Monday-Friday 8 am - 5 pm): [phone] On Call: [phone] and follow prompts after 5 pm and on weekends Office Phone: [phone] Office Fax: [phone]

## 2017-08-15 DIAGNOSIS — N3941 Urge incontinence: Secondary | ICD-10-CM | POA: Diagnosis not present

## 2017-08-15 DIAGNOSIS — M6281 Muscle weakness (generalized): Secondary | ICD-10-CM | POA: Diagnosis not present

## 2017-08-15 DIAGNOSIS — L309 Dermatitis, unspecified: Secondary | ICD-10-CM | POA: Diagnosis not present

## 2017-08-16 DIAGNOSIS — N3941 Urge incontinence: Secondary | ICD-10-CM | POA: Diagnosis not present

## 2017-08-16 DIAGNOSIS — M6281 Muscle weakness (generalized): Secondary | ICD-10-CM | POA: Diagnosis not present

## 2017-08-16 DIAGNOSIS — L309 Dermatitis, unspecified: Secondary | ICD-10-CM | POA: Diagnosis not present

## 2017-08-22 DIAGNOSIS — N3941 Urge incontinence: Secondary | ICD-10-CM | POA: Diagnosis not present

## 2017-08-22 DIAGNOSIS — M6281 Muscle weakness (generalized): Secondary | ICD-10-CM | POA: Diagnosis not present

## 2017-08-22 DIAGNOSIS — L309 Dermatitis, unspecified: Secondary | ICD-10-CM | POA: Diagnosis not present

## 2017-08-24 DIAGNOSIS — L309 Dermatitis, unspecified: Secondary | ICD-10-CM | POA: Diagnosis not present

## 2017-08-24 DIAGNOSIS — N3941 Urge incontinence: Secondary | ICD-10-CM | POA: Diagnosis not present

## 2017-08-24 DIAGNOSIS — M6281 Muscle weakness (generalized): Secondary | ICD-10-CM | POA: Diagnosis not present

## 2017-08-25 DIAGNOSIS — N3941 Urge incontinence: Secondary | ICD-10-CM | POA: Diagnosis not present

## 2017-08-25 DIAGNOSIS — M6281 Muscle weakness (generalized): Secondary | ICD-10-CM | POA: Diagnosis not present

## 2017-08-25 DIAGNOSIS — L309 Dermatitis, unspecified: Secondary | ICD-10-CM | POA: Diagnosis not present

## 2017-08-28 DIAGNOSIS — N3941 Urge incontinence: Secondary | ICD-10-CM | POA: Diagnosis not present

## 2017-08-28 DIAGNOSIS — L309 Dermatitis, unspecified: Secondary | ICD-10-CM | POA: Diagnosis not present

## 2017-08-28 DIAGNOSIS — M6281 Muscle weakness (generalized): Secondary | ICD-10-CM | POA: Diagnosis not present

## 2017-08-29 DIAGNOSIS — M6281 Muscle weakness (generalized): Secondary | ICD-10-CM | POA: Diagnosis not present

## 2017-08-29 DIAGNOSIS — L309 Dermatitis, unspecified: Secondary | ICD-10-CM | POA: Diagnosis not present

## 2017-08-29 DIAGNOSIS — N3941 Urge incontinence: Secondary | ICD-10-CM | POA: Diagnosis not present

## 2017-08-30 DIAGNOSIS — L309 Dermatitis, unspecified: Secondary | ICD-10-CM | POA: Diagnosis not present

## 2017-08-30 DIAGNOSIS — N3941 Urge incontinence: Secondary | ICD-10-CM | POA: Diagnosis not present

## 2017-08-30 DIAGNOSIS — M6281 Muscle weakness (generalized): Secondary | ICD-10-CM | POA: Diagnosis not present

## 2017-09-03 DIAGNOSIS — L309 Dermatitis, unspecified: Secondary | ICD-10-CM | POA: Diagnosis not present

## 2017-09-03 DIAGNOSIS — N3941 Urge incontinence: Secondary | ICD-10-CM | POA: Diagnosis not present

## 2017-09-03 DIAGNOSIS — M6281 Muscle weakness (generalized): Secondary | ICD-10-CM | POA: Diagnosis not present

## 2017-09-05 DIAGNOSIS — L309 Dermatitis, unspecified: Secondary | ICD-10-CM | POA: Diagnosis not present

## 2017-09-05 DIAGNOSIS — M6281 Muscle weakness (generalized): Secondary | ICD-10-CM | POA: Diagnosis not present

## 2017-09-05 DIAGNOSIS — N3941 Urge incontinence: Secondary | ICD-10-CM | POA: Diagnosis not present

## 2017-09-06 DIAGNOSIS — N3941 Urge incontinence: Secondary | ICD-10-CM | POA: Diagnosis not present

## 2017-09-06 DIAGNOSIS — M6281 Muscle weakness (generalized): Secondary | ICD-10-CM | POA: Diagnosis not present

## 2017-09-06 DIAGNOSIS — L309 Dermatitis, unspecified: Secondary | ICD-10-CM | POA: Diagnosis not present

## 2017-09-10 DIAGNOSIS — N3941 Urge incontinence: Secondary | ICD-10-CM | POA: Diagnosis not present

## 2017-09-10 DIAGNOSIS — L309 Dermatitis, unspecified: Secondary | ICD-10-CM | POA: Diagnosis not present

## 2017-09-10 DIAGNOSIS — M6281 Muscle weakness (generalized): Secondary | ICD-10-CM | POA: Diagnosis not present

## 2017-09-11 ENCOUNTER — Encounter: Payer: Self-pay | Admitting: Internal Medicine

## 2017-09-11 DIAGNOSIS — L309 Dermatitis, unspecified: Secondary | ICD-10-CM | POA: Diagnosis not present

## 2017-09-11 DIAGNOSIS — N3941 Urge incontinence: Secondary | ICD-10-CM | POA: Diagnosis not present

## 2017-09-11 DIAGNOSIS — M6281 Muscle weakness (generalized): Secondary | ICD-10-CM | POA: Diagnosis not present

## 2017-09-13 DIAGNOSIS — L309 Dermatitis, unspecified: Secondary | ICD-10-CM | POA: Diagnosis not present

## 2017-09-13 DIAGNOSIS — M6281 Muscle weakness (generalized): Secondary | ICD-10-CM | POA: Diagnosis not present

## 2017-09-13 DIAGNOSIS — N3941 Urge incontinence: Secondary | ICD-10-CM | POA: Diagnosis not present

## 2017-09-14 DIAGNOSIS — L309 Dermatitis, unspecified: Secondary | ICD-10-CM | POA: Diagnosis not present

## 2017-09-14 DIAGNOSIS — M6281 Muscle weakness (generalized): Secondary | ICD-10-CM | POA: Diagnosis not present

## 2017-09-14 DIAGNOSIS — N3941 Urge incontinence: Secondary | ICD-10-CM | POA: Diagnosis not present

## 2017-09-17 DIAGNOSIS — M6281 Muscle weakness (generalized): Secondary | ICD-10-CM | POA: Diagnosis not present

## 2017-09-17 DIAGNOSIS — L309 Dermatitis, unspecified: Secondary | ICD-10-CM | POA: Diagnosis not present

## 2017-09-17 DIAGNOSIS — N3941 Urge incontinence: Secondary | ICD-10-CM | POA: Diagnosis not present

## 2017-09-18 DIAGNOSIS — M6281 Muscle weakness (generalized): Secondary | ICD-10-CM | POA: Diagnosis not present

## 2017-09-18 DIAGNOSIS — L309 Dermatitis, unspecified: Secondary | ICD-10-CM | POA: Diagnosis not present

## 2017-09-18 DIAGNOSIS — N3941 Urge incontinence: Secondary | ICD-10-CM | POA: Diagnosis not present

## 2017-09-20 DIAGNOSIS — M6281 Muscle weakness (generalized): Secondary | ICD-10-CM | POA: Diagnosis not present

## 2017-09-20 DIAGNOSIS — L309 Dermatitis, unspecified: Secondary | ICD-10-CM | POA: Diagnosis not present

## 2017-09-20 DIAGNOSIS — N3941 Urge incontinence: Secondary | ICD-10-CM | POA: Diagnosis not present

## 2017-09-24 DIAGNOSIS — L309 Dermatitis, unspecified: Secondary | ICD-10-CM | POA: Diagnosis not present

## 2017-09-24 DIAGNOSIS — N3941 Urge incontinence: Secondary | ICD-10-CM | POA: Diagnosis not present

## 2017-09-24 DIAGNOSIS — M6281 Muscle weakness (generalized): Secondary | ICD-10-CM | POA: Diagnosis not present

## 2017-09-25 DIAGNOSIS — N3941 Urge incontinence: Secondary | ICD-10-CM | POA: Diagnosis not present

## 2017-09-25 DIAGNOSIS — M6281 Muscle weakness (generalized): Secondary | ICD-10-CM | POA: Diagnosis not present

## 2017-09-25 DIAGNOSIS — L309 Dermatitis, unspecified: Secondary | ICD-10-CM | POA: Diagnosis not present

## 2017-10-01 ENCOUNTER — Telehealth: Payer: Self-pay | Admitting: Family Medicine

## 2017-10-01 NOTE — Telephone Encounter (Signed)
I left a message asking the patient to call me at [phone] to schedule AWV at [facility] clinic this week on either Tuesday afternoon or Thurs/Fri morning. VDM (DD)

## 2017-10-02 ENCOUNTER — Non-Acute Institutional Stay: Payer: Medicare Other

## 2017-10-02 VITALS — BP 140/72 | HR 78 | Temp 97.7°F | Ht 61.0 in | Wt 159.0 lb

## 2017-10-02 DIAGNOSIS — Z0000 Encounter for general adult medical examination without abnormal findings: Secondary | ICD-10-CM | POA: Diagnosis not present

## 2017-10-02 MED ORDER — ZOSTER VAC RECOMB ADJUVANTED 50 MCG/0.5ML IM SUSR: 0.5000 mL | Freq: Once | INTRAMUSCULAR | 1 refills | Status: AC

## 2017-10-02 NOTE — Progress Notes (Signed)
Subjective:   Lisa Barrera is a 84 y.o. female who presents for Medicare Annual (Subsequent) preventive examination at [facility]  Last AWV-09/25/2016       Objective:     Vitals: BP 140/72 (BP Location: Left Arm, Patient Position: Sitting)   Pulse 78   Temp 97.7 F (36.5 C) (Oral)   Ht _0  (1.549 m)   Wt 159 lb (72.1 kg)   SpO2 96%   BMI 30.04 kg/m   Body mass index is 30.04 kg/m.  Advanced Directives 10/02/2017 06/24/2013  Does Patient Have a Medical Advance Directive? Yes Patient would like information  Type of Advance Directive [place];Living will -  Does patient want to make changes to medical advance directive? No - Patient declined -  Copy of [place] in Chart? Yes -    Tobacco Social History   Tobacco Use  Smoking Status Never Smoker  Smokeless Tobacco Never Used     Counseling given: Not Answered   Clinical Intake:  Pre-visit preparation completed: No  Pain : No/denies pain     Nutritional Risks: None Diabetes: No  How often do you need to have someone help you when you read instructions, pamphlets, or other written materials from your doctor or pharmacy?: 1 - Never What is the last grade level you completed in school?: Bachelors  Interpreter Needed?: No  Information entered by :: Tyson Dense, RN  Past Medical History:  Diagnosis Date  . Eczema   . Hypothyroid   . Hypothyroidism   . Vitamin D deficiency    Past Surgical History:  Procedure Laterality Date  . CHOLECYSTECTOMY N/A 06/24/2013   Procedure: LAPAROSCOPIC CHOLECYSTECTOMY WITH INTRAOPERATIVE CHOLANGIOGRAM;  Surgeon: Ralene Ok, MD;  Location: [facility];  Service: General;  Laterality: N/A;  . TONSILLECTOMY     Family History  Problem Relation Age of Onset  . Multiple myeloma Mother   . Prostate cancer Father   . Heart disease Father    Social History   Socioeconomic History  .  Marital status: Widowed    Spouse name: None  . Number of children: 3  . Years of education: College  . Highest education level: None  Social Needs  . Financial resource strain: Not hard at all  . Food insecurity - worry: Never true  . Food insecurity - inability: Never true  . Transportation needs - medical: No  . Transportation needs - non-medical: No  Occupational History  . Occupation: Retired  Tobacco Use  . Smoking status: Never Smoker  . Smokeless tobacco: Never Used  Substance and Sexual Activity  . Alcohol use: No    Alcohol/week: 0.0 oz  . Drug use: No  . Sexual activity: None  Other Topics Concern  . None  Social History Narrative   Tobacco use, amount per day now: None      Past tobacco use, amount per day: None      How many years did you use tobacco: Never      Alcohol use (drinks per week): None      Diet: 2 meals a day at [facility]      Do you drink/eat things with caffeine? Coffee      Marital status: Widowed             What year were you married? 1952      Do you live in a house, apartment, assisted living, condo, trailer? Apartment  Is it one or more stories? 1      How many persons live in your home?      Do you have any pets in your home? 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 Yes           How often? Weekly and daily classes. Does walking on her own.      Do you have a living will? Yes      Do you have a DNR form?   Yes         If not, do you want to discuss one?      Do you have signed POA/HPOA forms? Yes               Outpatient Encounter Medications as of 10/02/2017  Medication Sig  . aspirin EC 81 MG tablet Take 1 tablet (81 mg total) by mouth daily.  . calcium-vitamin D (OSCAL 500/200 D-3) 500-200 MG-UNIT tablet Take 1 tablet by mouth 2 (two) times daily.  Marland Kitchen donepezil (ARICEPT) 5 MG tablet Take 1 tablet (5 mg total) by mouth at bedtime.  Marland Kitchen levothyroxine (SYNTHROID,  LEVOTHROID) 75 MCG tablet Take 1 tablet (75 mcg total) by mouth daily before breakfast.   No facility-administered encounter medications on file as of 10/02/2017.     Activities of Daily Living In your present state of health, do you have any difficulty performing the following activities: 10/02/2017  Hearing? Y  Vision? N  Difficulty concentrating or making decisions? Y  Comment some forgetfulness  Dressing or bathing? N  Doing errands, shopping? N  Preparing Food and eating ? N  Using the Toilet? N  In the past six months, have you accidently leaked urine? Y  Do you have problems with loss of bowel control? N  Managing your Medications? N  Managing your Finances? N  Housekeeping or managing your Housekeeping? N  Some recent data might be hidden    Patient Care Team: Rankins, Bill Salinas, MD as PCP - General (Family Medicine)    Assessment:   This is a routine wellness examination for Lisa Barrera.  Exercise Activities and Dietary recommendations Current Exercise Habits: Structured exercise class, Type of exercise: Other - see comments(fhg classes), Time (Minutes): 30, Frequency (Times/Week): 7, Weekly Exercise (Minutes/Week): 210, Exercise limited by: None identified  Goals    . maintain lifestyle     Patient would like to maintain her lifestyle       Fall Risk Fall Risk  10/02/2017  Falls in the past year? Yes  Number falls in past yr: 1  Injury with Fall? No   Is the patient's home free of loose throw rugs in walkways, pet beds, electrical cords, etc?   yes      Grab bars in the bathroom? yes      Handrails on the stairs?   yes      Adequate lighting?   yes  Timed Get Up and Go performed: 18 seconds, fall risk  Depression Screen PHQ 2/9 Scores 10/02/2017  PHQ - 2 Score 0     Cognitive Function MMSE - Mini Mental State Exam 06/26/2017  Not completed: (No Data)  Orientation to time 4  Orientation to Place 5  Registration 3  Attention/ Calculation 4  Recall 2    Language- name 2 objects 2  Language- repeat 1  Language- follow 3 step command 1  Language- read &  follow direction 1  Write a sentence 1  Copy design 0  Total score 24        Immunization History  Administered Date(s) Administered  . Influenza,inj,Quad PF,6+ Mos 06/25/2013  . Pneumococcal Polysaccharide-23 06/25/2013    Qualifies for Shingles Vaccine? Yes, educated and prescription sent to pharmacy  Screening Tests Health Maintenance  Topic Date Due  . TETANUS/TDAP  04[birth year]19[birth year]1947  . DEXA SCAN  04/18/1993  . PNA vac Low Risk Adult (2 of 2 - PCV13) 06/25/2014  . INFLUENZA VACCINE  Completed    Cancer Screenings: Lung: Low Dose CT Chest recommended if Age 51-80 years, 30 pack-year currently smoking OR have quit w/in 15years. Patient does not qualify. Breast:  Up to date on Mammogram? Yes   Up to date of Bone Density/Dexa? Yes Colorectal: up to date  Additional Screenings:  Hepatitis B/HIV/Syphillis:declined Hepatitis C Screening: declined     Plan:    I have personally reviewed and addressed the Medicare Annual Wellness questionnaire and have noted the following in the patient's chart:  A. Medical and social history B. Use of alcohol, tobacco or illicit drugs  C. Current medications and supplements D. Functional ability and status E.  Nutritional status F.  Physical activity G. Advance directives H. List of other physicians I.  Hospitalizations, surgeries, and ER visits in previous 12 months J.  [place] to include hearing, vision, cognitive, depression L. Referrals and appointments - none  In addition, I have reviewed and discussed with patient certain preventive protocols, quality metrics, and best practice recommendations. A written personalized care plan for preventive services as well as general preventive health recommendations were provided to patient.  See attached scanned questionnaire for additional information.   Signed,   Tyson Dense, RN Nurse Health Advisor   Quick Notes   Health Maintenance: Shingrix due and prescription sent to pharmacy. Pt is unsure about past immunizations-[facility] states TDAP 02/17/2011, PNA 13 04/08/2014, DEXA 04/30/2013. Pt unsure where immunization records would be from     Abnormal Screen: MMSE on 06/29/2017 24/30     Patient Concerns: none     Nurse Concerns: none

## 2017-10-02 NOTE — Patient Instructions (Signed)
Lisa Barrera , Thank you for taking time to come for your Medicare Wellness Visit. I appreciate your ongoing commitment to your health goals. Please review the following plan we discussed and let me know if I can assist you in the future.   Screening recommendations/referrals: Colonoscopy excluded, you are over age 82 Mammogram excluded, you are over age 1 Bone Density up to date Recommended yearly ophthalmology/optometry visit for glaucoma screening and checkup Recommended yearly dental visit for hygiene and checkup  Vaccinations: Influenza vaccine up to date, due 2019 fall season Pneumococcal vaccine up to date Tdap vaccine up to date. Due 02/16/2021 Shingles vaccine due, prescription sent to pharmacy    Advanced directives: in chart  Conditions/risks identified: none  Next appointment: Dr. Bubba Camp  12/18/2017 @ 8:30am   Preventive Care 65 Years and Older, Female Preventive care refers to lifestyle choices and visits with your health care provider that can promote health and wellness. What does preventive care include?  A yearly physical exam. This is also called an annual well check.  Dental exams once or twice a year.  Routine eye exams. Ask your health care provider how often you should have your eyes checked.  Personal lifestyle choices, including:  Daily care of your teeth and gums.  Regular physical activity.  Eating a healthy diet.  Avoiding tobacco and drug use.  Limiting alcohol use.  Practicing safe sex.  Taking low-dose aspirin every day.  Taking vitamin and mineral supplements as recommended by your health care provider. What happens during an annual well check? The services and screenings done by your health care provider during your annual well check will depend on your age, overall health, lifestyle risk factors, and family history of disease. Counseling  Your health care provider may ask you questions about your:  Alcohol use.  Tobacco  use.  Drug use.  Emotional well-being.  Home and relationship well-being.  Sexual activity.  Eating habits.  History of falls.  Memory and ability to understand (cognition).  Work and work Statistician.  Reproductive health. Screening  You may have the following tests or measurements:  Height, weight, and BMI.  Blood pressure.  Lipid and cholesterol levels. These may be checked every 5 years, or more frequently if you are over 68 years old.  Skin check.  Lung cancer screening. You may have this screening every year starting at age 69 if you have a 30-pack-year history of smoking and currently smoke or have quit within the past 15 years.  Fecal occult blood test (FOBT) of the stool. You may have this test every year starting at age 62.  Flexible sigmoidoscopy or colonoscopy. You may have a sigmoidoscopy every 5 years or a colonoscopy every 10 years starting at age 83.  Hepatitis C blood test.  Hepatitis B blood test.  Sexually transmitted disease (STD) testing.  Diabetes screening. This is done by checking your blood sugar (glucose) after you have not eaten for a while (fasting). You may have this done every 1-3 years.  Bone density scan. This is done to screen for osteoporosis. You may have this done starting at age 77.  Mammogram. This may be done every 1-2 years. Talk to your health care provider about how often you should have regular mammograms. Talk with your health care provider about your test results, treatment options, and if necessary, the need for more tests. Vaccines  Your health care provider may recommend certain vaccines, such as:  Influenza vaccine. This is recommended every year.  Tetanus, diphtheria, and acellular pertussis (Tdap, Td) vaccine. You may need a Td booster every 10 years.  Zoster vaccine. You may need this after age 52.  Pneumococcal 13-valent conjugate (PCV13) vaccine. One dose is recommended after age 26.  Pneumococcal  polysaccharide (PPSV23) vaccine. One dose is recommended after age 84. Talk to your health care provider about which screenings and vaccines you need and how often you need them. This information is not intended to replace advice given to you by your health care provider. Make sure you discuss any questions you have with your health care provider. Document Released: 09/24/2015 Document Revised: 05/17/2016 Document Reviewed: 06/29/2015 Elsevier Interactive Patient Education  2017 [place] Prevention in the Home Falls can cause injuries. They can happen to people of all ages. There are many things you can do to make your home safe and to help prevent falls. What can I do on the outside of my home?  Regularly fix the edges of walkways and driveways and fix any cracks.  Remove anything that might make you trip as you walk through a door, such as a raised step or threshold.  Trim any bushes or trees on the path to your home.  Use bright outdoor lighting.  Clear any walking paths of anything that might make someone trip, such as rocks or tools.  Regularly check to see if handrails are loose or broken. Make sure that both sides of any steps have handrails.  Any raised decks and porches should have guardrails on the edges.  Have any leaves, snow, or ice cleared regularly.  Use sand or salt on walking paths during winter.  Clean up any spills in your garage right away. This includes oil or grease spills. What can I do in the bathroom?  Use night lights.  Install grab bars by the toilet and in the tub and shower. Do not use towel bars as grab bars.  Use non-skid mats or decals in the tub or shower.  If you need to sit down in the shower, use a plastic, non-slip stool.  Keep the floor dry. Clean up any water that spills on the floor as soon as it happens.  Remove soap buildup in the tub or shower regularly.  Attach bath mats securely with double-sided non-slip rug  tape.  Do not have throw rugs and other things on the floor that can make you trip. What can I do in the bedroom?  Use night lights.  Make sure that you have a light by your bed that is easy to reach.  Do not use any sheets or blankets that are too big for your bed. They should not hang down onto the floor.  Have a firm chair that has side arms. You can use this for support while you get dressed.  Do not have throw rugs and other things on the floor that can make you trip. What can I do in the kitchen?  Clean up any spills right away.  Avoid walking on wet floors.  Keep items that you use a lot in easy-to-reach places.  If you need to reach something above you, use a strong step stool that has a grab bar.  Keep electrical cords out of the way.  Do not use floor polish or wax that makes floors slippery. If you must use wax, use non-skid floor wax.  Do not have throw rugs and other things on the floor that can make you trip. What can I do  with my stairs?  Do not leave any items on the stairs.  Make sure that there are handrails on both sides of the stairs and use them. Fix handrails that are broken or loose. Make sure that handrails are as long as the stairways.  Check any carpeting to make sure that it is firmly attached to the stairs. Fix any carpet that is loose or worn.  Avoid having throw rugs at the top or bottom of the stairs. If you do have throw rugs, attach them to the floor with carpet tape.  Make sure that you have a light switch at the top of the stairs and the bottom of the stairs. If you do not have them, ask someone to add them for you. What else can I do to help prevent falls?  Wear shoes that:  Do not have high heels.  Have rubber bottoms.  Are comfortable and fit you well.  Are closed at the toe. Do not wear sandals.  If you use a stepladder:  Make sure that it is fully opened. Do not climb a closed stepladder.  Make sure that both sides of the  stepladder are locked into place.  Ask someone to hold it for you, if possible.  Clearly mark and make sure that you can see:  Any grab bars or handrails.  First and last steps.  Where the edge of each step is.  Use tools that help you move around (mobility aids) if they are needed. These include:  Canes.  Walkers.  Scooters.  Crutches.  Turn on the lights when you go into a dark area. Replace any light bulbs as soon as they burn out.  Set up your furniture so you have a clear path. Avoid moving your furniture around.  If any of your floors are uneven, fix them.  If there are any pets around you, be aware of where they are.  Review your medicines with your doctor. Some medicines can make you feel dizzy. This can increase your chance of falling. Ask your doctor what other things that you can do to help prevent falls. This information is not intended to replace advice given to you by your health care provider. Make sure you discuss any questions you have with your health care provider. Document Released: 06/24/2009 Document Revised: 02/03/2016 Document Reviewed: 10/02/2014 Elsevier Interactive Patient Education  2017 [employer].

## 2017-12-10 ENCOUNTER — Other Ambulatory Visit: Payer: Self-pay

## 2017-12-10 DIAGNOSIS — E039 Hypothyroidism, unspecified: Secondary | ICD-10-CM

## 2017-12-13 ENCOUNTER — Other Ambulatory Visit: Payer: Medicare Other

## 2017-12-18 ENCOUNTER — Other Ambulatory Visit: Payer: Medicare Other

## 2017-12-18 ENCOUNTER — Encounter: Payer: Self-pay | Admitting: Internal Medicine

## 2017-12-18 ENCOUNTER — Non-Acute Institutional Stay: Payer: Medicare Other | Admitting: Internal Medicine

## 2017-12-18 VITALS — BP 134/70 | HR 60 | Temp 98.0°F | Resp 16 | Ht 61.0 in | Wt 153.0 lb

## 2017-12-18 DIAGNOSIS — R739 Hyperglycemia, unspecified: Secondary | ICD-10-CM | POA: Insufficient documentation

## 2017-12-18 DIAGNOSIS — R413 Other amnesia: Secondary | ICD-10-CM

## 2017-12-18 DIAGNOSIS — E559 Vitamin D deficiency, unspecified: Secondary | ICD-10-CM | POA: Diagnosis not present

## 2017-12-18 DIAGNOSIS — E039 Hypothyroidism, unspecified: Secondary | ICD-10-CM

## 2017-12-18 DIAGNOSIS — H6121 Impacted cerumen, right ear: Secondary | ICD-10-CM

## 2017-12-18 DIAGNOSIS — E663 Overweight: Secondary | ICD-10-CM

## 2017-12-18 MED ORDER — CALCIUM CARBONATE-VITAMIN D 500-200 MG-UNIT PO TABS: 1.0000 | ORAL_TABLET | Freq: Two times a day (BID) | ORAL | 3 refills | Status: DC

## 2017-12-18 MED ORDER — CARBAMIDE PEROXIDE 6.5 % OT SOLN: 5.0000 [drp] | Freq: Every day | OTIC | 0 refills | Status: AC

## 2017-12-18 NOTE — Progress Notes (Signed)
[facility]  Provider: Blanchie Serve MD   Location:  [place] of Service:  Clinic (12)  PCP: Blanchie Serve, MD Patient Care Team: Blanchie Serve, MD as PCP - General (Internal Medicine)  Extended Emergency Contact Information Primary Emergency Contact: Edmundson,[name] Address: [address]          [place], [place] [address] [name] of [place] Phone: [phone] Mobile Phone: [phone] Relation: Son Secondary Emergency Contact: Roback,Brett Address: [address]          [place], [place] [address] [name] of [place] Phone: [phone] Work Phone: [phone] Mobile Phone: [phone] Relation: Son   Goals of Care: Advanced Directive information Advanced Directives 10/02/2017  Does Patient Have a Medical Advance Directive? Yes  Type of Paramedic of [place];Living will  Does patient want to make changes to medical advance directive? No - Patient declined  Copy of [place] in Chart? Yes      Chief Complaint  Patient presents with  . Medical Management of Chronic Issues    6 month follow up. No concernc at this time  . Medication Refill    No refills needed at this time    HPI: Patient is a 84 y.o. female seen today for routine visit.   Hypothyroidism- takes her levothyroxine when she remembers, there are days she could have missed it but takes it most of the days. Does not take it in the morning. Currently on levothyroxine 75 mcg daily.  Cognitive impairment- currently on aricept 5 mg daily, takes it anytime she remembers to take it.   She is not taking her aspirin. Takes her calcium and vitamin d daily.   Past Medical History:  Diagnosis Date  . Eczema   . Hypothyroid   . Hypothyroidism   . Vitamin D deficiency    Past Surgical History:  Procedure Laterality Date  . CHOLECYSTECTOMY N/A 06/24/2013   Procedure: LAPAROSCOPIC CHOLECYSTECTOMY  WITH INTRAOPERATIVE CHOLANGIOGRAM;  Surgeon: Ralene Ok, MD;  Location: [facility];  Service: General;  Laterality: N/A;  . TONSILLECTOMY      reports that she has never smoked. She has never used smokeless tobacco. She reports that she does not drink alcohol or use drugs. Social History   Socioeconomic History  . Marital status: Widowed    Spouse name: Not on file  . Number of children: 3  . Years of education: College  . Highest education level: Not on file  Occupational History  . Occupation: Retired  Scientific laboratory technician  . Financial resource strain: Not hard at all  . Food insecurity:    Worry: Never true    Inability: Never true  . Transportation needs:    Medical: No    Non-medical: No  Tobacco Use  . Smoking status: Never Smoker  . Smokeless tobacco: Never Used  Substance and Sexual Activity  . Alcohol use: No    Alcohol/week: 0.0 oz  . Drug use: No  . Sexual activity: Not on file  Lifestyle  . Physical activity:    Days per week: 7 days    Minutes per session: 30 min  . Stress: Not at all  Relationships  . Social connections:    Talks on phone: More than three times a week    Gets together: More than three times a week    Attends religious service: Never    Active member of club or organization: No  Attends meetings of clubs or organizations: Never    Relationship status: Widowed  . Intimate partner violence:    Fear of current or ex partner: No    Emotionally abused: No    Physically abused: No    Forced sexual activity: No  Other Topics Concern  . Not on file  Social History Narrative   Tobacco use, amount per day now: None      Past tobacco use, amount per day: None      How many years did you use tobacco: Never      Alcohol use (drinks per week): None      Diet: 2 meals a day at [facility]      Do you drink/eat things with caffeine? Coffee      Marital status: Widowed             What year were you married? 1952      Do you live in a  house, apartment, assisted living, condo, trailer? Apartment      Is it one or more stories? 1      How many persons live in your home?      Do you have any pets in your home? 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 Yes           How often? Weekly and daily classes. Does walking on her own.      Do you have a living will? Yes      Do you have a DNR form?   Yes         If not, do you want to discuss one?      Do you have signed POA/HPOA forms? Yes               Functional Status Survey:    Family History  Problem Relation Age of Onset  . Multiple myeloma Mother   . Prostate cancer Father   . Heart disease Father     Health Maintenance  Topic Date Due  . TETANUS/TDAP  04/19/1947  . DEXA SCAN  04/18/1993  . PNA vac Low Risk Adult (2 of 2 - PCV13) 06/25/2014  . INFLUENZA VACCINE  04/11/2018    No Known Allergies  Outpatient Encounter Medications as of 12/18/2017  Medication Sig  . aspirin EC 81 MG tablet Take 1 tablet (81 mg total) by mouth daily. (Patient not taking: Reported on 12/18/2017)  . calcium-vitamin D (OSCAL 500/200 D-3) 500-200 MG-UNIT tablet Take 1 tablet by mouth 2 (two) times daily. (Patient not taking: Reported on 12/18/2017)  . donepezil (ARICEPT) 5 MG tablet Take 1 tablet (5 mg total) by mouth at bedtime. (Patient not taking: Reported on 12/18/2017)  . levothyroxine (SYNTHROID, LEVOTHROID) 75 MCG tablet Take 1 tablet (75 mcg total) by mouth daily before breakfast. (Patient not taking: Reported on 12/18/2017)   No facility-administered encounter medications on file as of 12/18/2017.     Review of Systems  Constitutional: Negative for appetite change, chills, fatigue and fever.       Walks for exercise and participates in group exercise classes at the facility.   HENT: Positive for hearing loss. Negative for congestion, ear pain, mouth sores, postnasal drip, rhinorrhea, sore throat, tinnitus and trouble swallowing.     Respiratory: Negative for cough, choking, shortness of breath and wheezing.   Cardiovascular: Negative for chest pain, palpitations and leg swelling.  Gastrointestinal: Negative for abdominal pain, blood in stool, constipation, diarrhea, nausea and vomiting.  Genitourinary: Positive for frequency. Negative for dysuria, hematuria and vaginal discharge.       Wakes up 2-3 times at night to urinate  Musculoskeletal: Negative for arthralgias and back pain.       No fall reported, no assistive device used  Skin: Negative for rash and wound.  Neurological: Negative for dizziness, seizures, syncope, weakness, numbness and headaches.  Hematological: Does not bruise/bleed easily.  Psychiatric/Behavioral: Positive for confusion. Negative for dysphoric mood, sleep disturbance and suicidal ideas.    Vitals:   12/18/17 0830  BP: 134/70  Pulse: 60  Resp: 16  Temp: 98 F (36.7 C)  TempSrc: Oral  SpO2: 94%  Weight: 153 lb (69.4 kg)  Height: '5\' 1"'$  (1.549 m)   Body mass index is 28.91 kg/m.   Wt Readings from Last 3 Encounters:  12/18/17 153 lb (69.4 kg)  10/02/17 159 lb (72.1 kg)  06/26/17 159 lb 3.2 oz (72.2 kg)   Physical Exam  Constitutional: She is oriented to person, place, and time. No distress.  Overweight elderly female in no acute distress  HENT:  Head: Normocephalic and atraumatic.  Left Ear: External ear normal.  Nose: Nose normal.  Mouth/Throat: Oropharynx is clear and moist. No oropharyngeal exudate.  Right impacted cerumen  Eyes: Pupils are equal, round, and reactive to light. Conjunctivae and EOM are normal. Right eye exhibits no discharge. Left eye exhibits no discharge.  Neck: Normal range of motion. Neck supple.  Cardiovascular: Normal rate, regular rhythm and intact distal pulses.  Pulmonary/Chest: Effort normal and breath sounds normal. She has no wheezes. She has no rales.  Abdominal: Soft. Bowel sounds are normal. There is no tenderness. There is no guarding.   Musculoskeletal: Normal range of motion. She exhibits no edema or tenderness.  Lymphadenopathy:    She has no cervical adenopathy.  Neurological: She is alert and oriented to person, place, and time.  Skin: Skin is warm and dry. No rash noted. She is not diaphoretic.  Psychiatric: She has a normal mood and affect. Her behavior is normal.    Labs reviewed: Basic Metabolic Panel: Recent Labs    06/19/17 0000  NA 134*  K 4.1  CL 103  CO2 22  GLUCOSE 117*  BUN 11  CREATININE 0.64  CALCIUM 8.8   Liver Function Tests: Recent Labs    06/19/17 0000  AST 14  ALT 10  BILITOT 1.3*  PROT 6.0*   No results for input(s): LIPASE, AMYLASE in the last 8760 hours. No results for input(s): AMMONIA in the last 8760 hours. CBC: Recent Labs    06/19/17 0000  WBC 7.6  HGB 15.2  HCT 44.6  MCV 96.5  PLT 172   Cardiac Enzymes: No results for input(s): CKTOTAL, CKMB, CKMBINDEX, TROPONINI in the last 8760 hours. BNP: Invalid input(s): POCBNP No results found for: HGBA1C Lab Results  Component Value Date   TSH 3.28 06/19/2017   No results found for: VITAMINB12 No results found for: FOLATE No results found for: IRON, TIBC, FERRITIN  Lipid Panel: Recent Labs    06/19/17 0000  CHOL 185  HDL 67  LDLCALC 99  TRIG 97  CHOLHDL 2.8   No results found for: HGBA1C  Procedures since last visit: No results found.  Assessment/Plan  1. Acquired hypothyroidism Advised to take levothyroxine current dosing daily in morning empty stomach. Pt agrees.  - CMP with eGFR([facility]); Future - Lipid Panel; Future -  TSH; Future - CBC (no diff); Future  2. Memory loss Continue donepezil and supportive care. Advised her to have home health agency or family member's help with her medication, she does not want this for now. Instructions printed out for each medication and advised patient to keep this next to her bed and take medication as prescribed.   3. Vitamin D deficiency Continue calcium  and vitamin d supplement.  - Vitamin D, 1,25-dihydroxy; Future - calcium-vitamin D (OSCAL 500/200 D-3) 500-200 MG-UNIT tablet; Take 1 tablet by mouth 2 (two) times daily.  Dispense: 180 tablet; Refill: 3  4. Hyperglycemia Continue with exercise as present. counselled on eating more fruits and vegetables.  - CMP with eGFR([facility]); Future - Hemoglobin A1c; Future  5. Overweight (BMI 25.0-29.9) Advised on exercise and diet. Has lost few pounds since last visit. Check lipid and a1c. - CMP with eGFR([facility]); Future  6. Right ear impacted cerumen Debrox to right ear for 1 week f/b ear lavage  Labs/tests ordered:   Lab Orders     CMP with eGFR([facility])     Lipid Panel     TSH     Hemoglobin A1c     CBC (no diff)     Vitamin D, 1,25-dihydroxy   Next appointment: 3 months, MMSE, EKG, physical exam  Communication: reviewed care plan with patient    Blanchie Serve, MD Internal Medicine [facility] [place], [place] [address] Cell Phone (Monday-Friday 8 am - 5 pm): [phone] On Call: [phone] and follow prompts after 5 pm and on weekends Office Phone: [phone] Office Fax: [phone]

## 2017-12-18 NOTE — Patient Instructions (Signed)
  Apply 5 drops of the ear drop to right ear once every day for 1 week and come to the clinic to get your ear cleaned  Take your thyroid medication- levothyroxine first thing in the morning empty stomach everyday

## 2017-12-20 DIAGNOSIS — E039 Hypothyroidism, unspecified: Secondary | ICD-10-CM | POA: Diagnosis not present

## 2017-12-21 LAB — BASIC METABOLIC PANEL WITH GFR
BUN: 10 mg/dL (ref 7–25)
CALCIUM: 9.2 mg/dL (ref 8.6–10.4)
CHLORIDE: 100 mmol/L (ref 98–110)
CO2: 26 mmol/L (ref 20–32)
CREATININE: 0.71 mg/dL (ref 0.60–0.88)
GFR, Est African American: 88 mL/min/{1.73_m2} (ref >=60)
GFR, Est Non African American: 76 mL/min/{1.73_m2} (ref >=60)
GLUCOSE: 102 mg/dL — AB (ref 65–99)
Potassium: 4.1 mmol/L (ref 3.5–5.3)
Sodium: 136 mmol/L (ref 135–146)

## 2017-12-21 LAB — TSH: TSH: 3.6 mIU/L (ref 0.40–4.50)

## 2017-12-25 ENCOUNTER — Encounter: Payer: Self-pay | Admitting: Internal Medicine

## 2017-12-25 ENCOUNTER — Non-Acute Institutional Stay: Payer: Medicare Other | Admitting: Internal Medicine

## 2017-12-25 VITALS — BP 120/60 | HR 72 | Temp 97.4°F | Resp 16 | Ht 61.0 in | Wt 153.0 lb

## 2017-12-25 DIAGNOSIS — H6121 Impacted cerumen, right ear: Secondary | ICD-10-CM

## 2017-12-25 NOTE — Patient Instructions (Signed)
Patient instructed to keep next appointment.

## 2017-12-26 NOTE — Progress Notes (Signed)
Looked into patient ear canal. Normal ear exam with normal tympanic membrane.

## 2018-01-01 NOTE — Progress Notes (Signed)
Patient returned to the clinic for ear lavage, warm water and hydrogen peroxide was used to remove the impaction of cerumen.

## 2018-01-29 ENCOUNTER — Other Ambulatory Visit: Payer: Self-pay

## 2018-01-29 DIAGNOSIS — E559 Vitamin D deficiency, unspecified: Secondary | ICD-10-CM

## 2018-01-29 DIAGNOSIS — E039 Hypothyroidism, unspecified: Secondary | ICD-10-CM

## 2018-01-29 DIAGNOSIS — R739 Hyperglycemia, unspecified: Secondary | ICD-10-CM

## 2018-01-30 DIAGNOSIS — E039 Hypothyroidism, unspecified: Secondary | ICD-10-CM | POA: Diagnosis not present

## 2018-01-30 DIAGNOSIS — E559 Vitamin D deficiency, unspecified: Secondary | ICD-10-CM | POA: Diagnosis not present

## 2018-01-30 DIAGNOSIS — R739 Hyperglycemia, unspecified: Secondary | ICD-10-CM | POA: Diagnosis not present

## 2018-01-31 DIAGNOSIS — E039 Hypothyroidism, unspecified: Secondary | ICD-10-CM

## 2018-01-31 DIAGNOSIS — E559 Vitamin D deficiency, unspecified: Secondary | ICD-10-CM

## 2018-01-31 DIAGNOSIS — R739 Hyperglycemia, unspecified: Secondary | ICD-10-CM

## 2018-02-07 ENCOUNTER — Other Ambulatory Visit: Payer: Medicare Other

## 2018-02-10 LAB — LIPID PANEL
CHOL/HDL RATIO: 2.9 (calc) (ref <5.0)
CHOLESTEROL: 188 mg/dL (ref <200)
HDL: 64 mg/dL (ref >50)
LDL Cholesterol (Calc): 106 mg/dL (calc) — ABNORMAL HIGH
Non-HDL Cholesterol (Calc): 124 mg/dL (calc) (ref <130)
Triglycerides: 89 mg/dL (ref <150)

## 2018-02-10 LAB — CBC
HCT: 44 % (ref 35.0–45.0)
HEMOGLOBIN: 15 g/dL (ref 11.7–15.5)
MCH: 33.2 pg — ABNORMAL HIGH (ref 27.0–33.0)
MCHC: 34.1 g/dL (ref 32.0–36.0)
MCV: 97.3 fL (ref 80.0–100.0)
MPV: 11 fL (ref 7.5–12.5)
PLATELETS: 187 10*3/uL (ref 140–400)
RBC: 4.52 10*6/uL (ref 3.80–5.10)
RDW: 12 % (ref 11.0–15.0)
WBC: 7.3 10*3/uL (ref 3.8–10.8)

## 2018-02-10 LAB — HEMOGLOBIN A1C
HEMOGLOBIN A1C: 5.7 %{Hb} — AB (ref <5.7)
Mean Plasma Glucose: 117 (calc)
eAG (mmol/L): 6.5 (calc)

## 2018-02-10 LAB — VITAMIN D 1,25 DIHYDROXY
Vitamin D 1, 25 (OH)2 Total: 51 pg/mL (ref 18–72)
Vitamin D3 1, 25 (OH)2: 51 pg/mL

## 2018-02-22 ENCOUNTER — Telehealth: Payer: Self-pay | Admitting: *Deleted

## 2018-02-22 NOTE — Telephone Encounter (Signed)
-----   Message from Blanchie Serve, MD sent at 02/21/2018  3:39 PM EDT ----- Regarding: FW: Move [place] Contact: [phone]   ----- Message ----- From: Hart Robinsons Sent: 02/21/2018   2:41 PM To: Blanchie Serve, MD Subject: [place]                                     Patient states that she has received [place].  She would like to talk with you regarding this.  Please give patient a call.

## 2018-02-22 NOTE — Telephone Encounter (Signed)
LMOM for patient to return my call or she can come visit me in the clinic regarding the referral she received to be moved [place].

## 2018-02-26 ENCOUNTER — Ambulatory Visit: Payer: Self-pay | Admitting: Internal Medicine

## 2018-02-26 ENCOUNTER — Encounter: Payer: Self-pay | Admitting: Internal Medicine

## 2018-03-08 ENCOUNTER — Encounter: Payer: Self-pay | Admitting: Nurse Practitioner

## 2018-03-08 ENCOUNTER — Non-Acute Institutional Stay: Payer: Medicare Other | Admitting: Nurse Practitioner

## 2018-03-08 DIAGNOSIS — R413 Other amnesia: Secondary | ICD-10-CM

## 2018-03-08 DIAGNOSIS — F339 Major depressive disorder, recurrent, unspecified: Secondary | ICD-10-CM | POA: Insufficient documentation

## 2018-03-08 DIAGNOSIS — F418 Other specified anxiety disorders: Secondary | ICD-10-CM

## 2018-03-08 DIAGNOSIS — Z7189 Other specified counseling: Secondary | ICD-10-CM | POA: Insufficient documentation

## 2018-03-08 NOTE — Progress Notes (Signed)
Location:   [name] Room Number: 517 Place of Service: [facility] Provider:  [facility] Lisa Priest NP  Blanchie Serve, MD  Patient Care Team: Blanchie Serve, MD as PCP - General (Internal Medicine)  Extended Emergency Contact Information Primary Emergency Contact: Barrera,Lisa Address: [address]          [place], [place] [address] Lisa Barrera Phone: [phone] Mobile Phone: [phone] Relation: Son Secondary Emergency Contact: Barrera,Lisa Address: [address]          [place], [place] [address] Lisa Barrera Phone: [phone] Work Phone: [phone] Mobile Phone: [phone] Relation: Son  Code Status:  DNR Goals of care: Advanced Directive information Advanced Directives 10/02/2017  Does Patient Have a Medical Advance Directive? Yes  Type of Paramedic of [place];Living will  Does patient want to make changes to medical advance directive? No - Patient declined  Copy of [place] in Chart? Yes     Chief Complaint  Patient presents with  . Acute Visit    ACP    HPI:  Pt is a 84 y.o. female seen today for advanced care planning counseling discussion.  The patient has history of dementia, on Donepezil for memory. [place], she stated she feel restless at times, doesn't sleep well at night.    Past Medical History:  Diagnosis Date  . Eczema   . Hypothyroid   . Hypothyroidism   . Vitamin D deficiency    Past Surgical History:  Procedure Laterality Date  . CHOLECYSTECTOMY N/A 06/24/2013   Procedure: LAPAROSCOPIC CHOLECYSTECTOMY WITH INTRAOPERATIVE CHOLANGIOGRAM;  Surgeon: Ralene Ok, MD;  Location: [facility];  Service: General;  Laterality: N/A;  . TONSILLECTOMY      No Known Allergies  Allergies as of 03/08/2018   No Known Allergies     Medication List        Accurate as of 03/08/18 11:59 PM. Always use your most  recent med list.          aspirin EC 81 MG tablet Take 1 tablet (81 mg total) by mouth daily.   calcium-vitamin D 500-200 MG-UNIT tablet Commonly known as:  OSCAL 500/200 D-3 Take 1 tablet by mouth 2 (two) times daily.   donepezil 5 MG tablet Commonly known as:  ARICEPT Take 1 tablet (5 mg total) by mouth at bedtime.   levothyroxine 75 MCG tablet Commonly known as:  SYNTHROID, LEVOTHROID Take 1 tablet (75 mcg total) by mouth daily before breakfast.       Review of Systems  Constitutional: Negative for activity change, appetite change, chills, diaphoresis, fatigue and fever.  HENT: Positive for hearing loss. Negative for congestion.   Respiratory: Negative for cough and shortness of breath.   Cardiovascular: Negative for chest pain, palpitations and leg swelling.  Gastrointestinal: Negative for abdominal distention and abdominal pain.  Genitourinary: Negative for difficulty urinating, dysuria and urgency.       Chronic 2-3x/night bathroom trips, occasional urinary incontinence.   Musculoskeletal: Positive for gait problem.  Skin: Negative for color change and pallor.  Neurological: Negative for dizziness, facial asymmetry, speech difficulty, weakness and headaches.       Memory lapses. Dementia.  Psychiatric/Behavioral: Positive for sleep disturbance. Negative for agitation, behavioral problems and hallucinations. The patient is nervous/anxious.     Immunization History  Administered Date(s) Administered  . Influenza,inj,Quad PF,6+ Mos 06/25/2013  . Pneumococcal Polysaccharide-23 06/25/2013  Pertinent  Health Maintenance Due  Topic Date Due  . DEXA SCAN  04/18/1993  . PNA vac Low Risk Adult (2 of 2 - PCV13) 06/25/2014  . INFLUENZA VACCINE  04/11/2018   Fall Risk  10/02/2017  Falls in the past year? Yes  Number falls in past yr: 1  Injury with Fall? No   Functional Status Survey:    Vitals:   03/08/18 1222  BP: 124/62  Pulse: 72  Resp: 18  Temp: 98 F (36.7  C)   There is no height or weight on file to calculate BMI. Physical Exam  Constitutional: She appears well-developed and well-nourished.  HENT:  Head: Normocephalic and atraumatic.  Eyes: Pupils are equal, round, and reactive to light. EOM are normal.  Neck: Normal range of motion. Neck supple. No JVD present. No thyromegaly present.  Cardiovascular: Normal rate and regular rhythm.  Pulmonary/Chest: She has no wheezes. She has no rales.  Abdominal: Soft. Bowel sounds are normal.  Musculoskeletal: She exhibits no edema or tenderness.  Wobbly gait, furniture walking.   Neurological: She is alert. No cranial nerve deficit. She exhibits normal muscle tone. Coordination normal.  Oriented to person and place.   Skin: Skin is warm and dry.  Psychiatric: She has a normal mood and affect. Her behavior is normal.    Labs reviewed: Recent Labs    06/19/17 0000 12/20/17 0705  NA 134* 136  K 4.1 4.1  CL 103 100  CO2 22 26  GLUCOSE 117* 102*  BUN 11 10  CREATININE 0.64 0.71  CALCIUM 8.8 9.2   Recent Labs    06/19/17 0000  AST 14  ALT 10  BILITOT 1.3*  PROT 6.0*   Recent Labs    06/19/17 0000 01/30/18 0000  WBC 7.6 7.3  HGB 15.2 15.0  HCT 44.6 44.0  MCV 96.5 97.3  PLT 172 187   Lab Results  Component Value Date   TSH 3.60 12/20/2017   Lab Results  Component Value Date   HGBA1C 5.7 (H) 01/30/2018   Lab Results  Component Value Date   CHOL 188 01/30/2018   HDL 64 01/30/2018   LDLCALC 106 (H) 01/30/2018   TRIG 89 01/30/2018   CHOLHDL 2.9 01/30/2018    Significant Diagnostic Results in last 30 days:  No results found.  Assessment/Plan  Situational anxiety Regarding [place], the patient try to understand if something she has done wrong causing her move into [facility]. She stated she doesn't sleet well at night, feels restless at times. Will observe, may consider anxiolytic agents if needed  Memory loss Continue Donepezil for memory, AL for care  assistance.   Advanced care planning/counseling discussion Goals of care discussion: reviewed goals of care with the patient's POA: Living will, HPOA on file. Went over MOST: the patient desires DNR when patient has no pulse and is not breathing, DNR form signed. The patient would like comfort measures, do not transfer to hospital unless comfort needs cannot be met.  She agrees limitation/determine  use of antibiotics when infection occurs.  IVF for a defined trial period. No feeding tube.  The patient's daughter, sons, Education officer, museum, [place] Conservation officer, historic buildings present. Copies made for the family and chart. Time spend 2:10pm to 2:35pm.    Family/ staff Communication: plan of care reviewed with the patient, the patient's POA daughter, Education officer, museum, and Camera operator.   Labs/tests ordered:  None  Time spend 25 minutes.

## 2018-03-08 NOTE — Assessment & Plan Note (Signed)
Continue Donepezil for memory, AL for care assistance.

## 2018-03-08 NOTE — Assessment & Plan Note (Signed)
Regarding [place], the patient try to understand if something she has done wrong causing her move into [facility]. She stated she doesn't sleet well at night, feels restless at times. Will observe, may consider anxiolytic agents if needed

## 2018-03-08 NOTE — Assessment & Plan Note (Addendum)
Goals of care discussion: reviewed goals of care with the patient's POA: Living will, HPOA on file. Went over MOST: the patient desires DNR when patient has no pulse and is not breathing, DNR form signed. The patient would like comfort measures, do not transfer to hospital unless comfort needs cannot be met.  She agrees limitation/determine  use of antibiotics when infection occurs.  IVF for a defined trial period. No feeding tube.  The patient's daughter, sons, Education officer, museum, [place] Conservation officer, historic buildings present. Copies made for the family and chart. Time spend 2:10pm to 2:35pm.

## 2018-03-11 ENCOUNTER — Encounter: Payer: Self-pay | Admitting: Nurse Practitioner

## 2018-03-13 DIAGNOSIS — R278 Other lack of coordination: Secondary | ICD-10-CM | POA: Diagnosis not present

## 2018-03-13 DIAGNOSIS — R41841 Cognitive communication deficit: Secondary | ICD-10-CM | POA: Diagnosis not present

## 2018-03-13 DIAGNOSIS — Z7389 Other problems related to life management difficulty: Secondary | ICD-10-CM | POA: Diagnosis not present

## 2018-03-13 DIAGNOSIS — M6281 Muscle weakness (generalized): Secondary | ICD-10-CM | POA: Diagnosis not present

## 2018-03-14 DIAGNOSIS — Z7389 Other problems related to life management difficulty: Secondary | ICD-10-CM | POA: Diagnosis not present

## 2018-03-14 DIAGNOSIS — M6281 Muscle weakness (generalized): Secondary | ICD-10-CM | POA: Diagnosis not present

## 2018-03-14 DIAGNOSIS — R41841 Cognitive communication deficit: Secondary | ICD-10-CM | POA: Diagnosis not present

## 2018-03-14 DIAGNOSIS — R278 Other lack of coordination: Secondary | ICD-10-CM | POA: Diagnosis not present

## 2018-03-19 ENCOUNTER — Non-Acute Institutional Stay: Payer: Medicare Other | Admitting: Internal Medicine

## 2018-03-19 ENCOUNTER — Encounter: Payer: Self-pay | Admitting: Internal Medicine

## 2018-03-19 VITALS — BP 126/70 | HR 59 | Temp 98.0°F | Resp 18 | Ht 61.0 in | Wt 158.0 lb

## 2018-03-19 DIAGNOSIS — R7303 Prediabetes: Secondary | ICD-10-CM

## 2018-03-19 DIAGNOSIS — R001 Bradycardia, unspecified: Secondary | ICD-10-CM

## 2018-03-19 DIAGNOSIS — H9193 Unspecified hearing loss, bilateral: Secondary | ICD-10-CM | POA: Diagnosis not present

## 2018-03-19 DIAGNOSIS — F0390 Unspecified dementia without behavioral disturbance: Secondary | ICD-10-CM | POA: Diagnosis not present

## 2018-03-19 DIAGNOSIS — Z0000 Encounter for general adult medical examination without abnormal findings: Secondary | ICD-10-CM

## 2018-03-19 DIAGNOSIS — E039 Hypothyroidism, unspecified: Secondary | ICD-10-CM

## 2018-03-19 DIAGNOSIS — I4510 Unspecified right bundle-branch block: Secondary | ICD-10-CM | POA: Diagnosis not present

## 2018-03-19 DIAGNOSIS — E663 Overweight: Secondary | ICD-10-CM | POA: Diagnosis not present

## 2018-03-19 NOTE — Patient Instructions (Signed)
  You had electrocardiogram done in ofice today that shows some abnormalities. I want an ultrasound of your heart to be done. I would also obtain old EKG and echocardiogram result from your previous doctor's office if done.   From now on, my team will see your atleast every 3 months in your room in Assisted living. You will have your routine blood work done there as well.

## 2018-03-19 NOTE — Progress Notes (Signed)
Location:  [facility] (12) Provider:  Blanchie Serve MD  Blanchie Serve, MD  Patient Care Team: Blanchie Serve, MD as PCP - General (Internal Medicine)  Extended Emergency Contact Information Primary Emergency Contact: Chowning,[name] Address: [address]          [place], [place] [address] [name] of [place] Phone: [phone] Mobile Phone: [phone] Relation: Son Secondary Emergency Contact: Mustin,Brett Address: [address]          [place], [place] [address] [name] of [place] Phone: [phone] Work Phone: [phone] Mobile Phone: [phone] Relation: Son  Code Status:  DNR  Goals of care: Advanced Directive information Advanced Directives 03/19/2018  Does Patient Have a Medical Advance Directive? Yes  Type of Advance Directive Out of facility DNR (pink MOST or yellow form)  Does patient want to make changes to medical advance directive? No - Patient declined  Copy of [place] in Chart? -  Pre-existing out of facility DNR order (yellow form or pink MOST form) Yellow form placed in chart (order not valid for inpatient use);Pink MOST form placed in chart (order not valid for inpatient use)     Chief Complaint  Patient presents with  . Annual Exam    No acute concerns at this time. Patient stated that she adjusting to living in assisted living.   Marland Kitchen MMSE    24/30. Passed clock drawing     HPI:  Pt is a 84 y.o. female seen today for medical management of chronic diseases. She has now been transferred to ALF. She is not happy being in ALF nad would like to go to ILF.   Hypothyroidism- taking levothyroxine 75 mcg daily. Tolerating it well  Dementia- no behavioral disturbance reported. Currently on aricept 5 mg daily. Adjusting to transfer to ALF  Unsteady gait- using walker for ambulation, no fall reported.   Hearing loss- pending appt with [facility] for further  evaluation  Past Medical History:  Diagnosis Date  . Eczema   . Hypothyroid   . Hypothyroidism   . Vitamin D deficiency    Past Surgical History:  Procedure Laterality Date  . CHOLECYSTECTOMY N/A 06/24/2013   Procedure: LAPAROSCOPIC CHOLECYSTECTOMY WITH INTRAOPERATIVE CHOLANGIOGRAM;  Surgeon: Ralene Ok, MD;  Location: [facility];  Service: General;  Laterality: N/A;  . TONSILLECTOMY      No Known Allergies  Outpatient Encounter Medications as of 03/19/2018  Medication Sig  . aspirin EC 81 MG tablet Take 1 tablet (81 mg total) by mouth daily.  . calcium-vitamin D (OSCAL 500/200 D-3) 500-200 MG-UNIT tablet Take 1 tablet by mouth 2 (two) times daily.  Marland Kitchen donepezil (ARICEPT) 5 MG tablet Take 1 tablet (5 mg total) by mouth at bedtime.  Marland Kitchen levothyroxine (SYNTHROID, LEVOTHROID) 75 MCG tablet Take 1 tablet (75 mcg total) by mouth daily before breakfast.   No facility-administered encounter medications on file as of 03/19/2018.     Review of Systems  Constitutional: Negative for appetite change, chills, diaphoresis, fatigue and fever.  HENT: Positive for hearing loss. Negative for congestion, ear discharge, ear pain, mouth sores, postnasal drip, rhinorrhea, sinus pressure, sore throat and trouble swallowing.   Eyes: Negative for pain, redness and itching.       Has reading glasses  Respiratory: Negative for cough, shortness of breath and wheezing.   Cardiovascular: Negative for chest pain, palpitations and leg swelling.  Gastrointestinal: Negative for abdominal pain, blood in stool, constipation, diarrhea,  nausea and vomiting.  Genitourinary: Negative for dysuria, flank pain, frequency, hematuria and pelvic pain.       Wakes up 2-3 times at night to urinate  Musculoskeletal: Positive for gait problem. Negative for arthralgias, back pain, joint swelling and myalgias.       Uses four wheel walker with brake and seat  Skin: Negative for rash and wound.  Neurological: Negative for dizziness,  weakness, numbness and headaches.  Psychiatric/Behavioral: Positive for confusion. Negative for behavioral problems, dysphoric mood, hallucinations, self-injury, sleep disturbance and suicidal ideas. The patient is not nervous/anxious.        Upset about being in ALF, wish she had not told anyone about having memory issues    Immunization History  Administered Date(s) Administered  . Influenza,inj,Quad PF,6+ Mos 06/25/2013  . Pneumococcal Polysaccharide-23 06/25/2013   Pertinent  Health Maintenance Due  Topic Date Due  . DEXA SCAN  04/18/1993  . PNA vac Low Risk Adult (2 of 2 - PCV13) 06/25/2014  . INFLUENZA VACCINE  04/11/2018   Fall Risk  10/02/2017  Falls in the past year? Yes  Number falls in past yr: 1  Injury with Fall? No   Functional Status Survey:    Vitals:   03/19/18 0953  BP: 126/70  Pulse: 70  Resp: 18  Temp: 98 F (36.7 C)  TempSrc: Oral  SpO2: 95%  Weight: 158 lb (71.7 kg)  Height: 5\' 1"  (1.549 m)   Body mass index is 29.85 kg/m.   Wt Readings from Last 3 Encounters:  03/19/18 158 lb (71.7 kg)  12/25/17 153 lb (69.4 kg)  12/18/17 153 lb (69.4 kg)   Physical Exam  Constitutional: She is oriented to person, place, and time.  Overweight elderly female in NAD  HENT:  Head: Normocephalic and atraumatic.  Right Ear: External ear normal.  Left Ear: External ear normal.  Nose: Nose normal.  Mouth/Throat: Oropharynx is clear and moist. No oropharyngeal exudate.  Eyes: Pupils are equal, round, and reactive to light. Conjunctivae and EOM are normal. Right eye exhibits no discharge. Left eye exhibits no discharge.  Neck: Normal range of motion. Neck supple.  Cardiovascular: Normal rate, regular rhythm and intact distal pulses.  Murmur heard. Pulmonary/Chest: Effort normal and breath sounds normal. No respiratory distress. She has no wheezes. She has no rales. She exhibits no tenderness.  Abdominal: Soft. Bowel sounds are normal. There is no tenderness.  There is no rebound and no guarding.  Musculoskeletal: She exhibits no edema or tenderness.  Needs support of arm to get out of chair, holds on to things for transfer, unsteady gait, uses walker  Lymphadenopathy:    She has no cervical adenopathy.  Neurological: She is alert and oriented to person, place, and time. She exhibits normal muscle tone.  Skin: Skin is warm and dry. Capillary refill takes less than 2 seconds. She is not diaphoretic.  Psychiatric: She has a normal mood and affect. Her behavior is normal.    Labs reviewed: Recent Labs    06/19/17 0000 12/20/17 0705  NA 134* 136  K 4.1 4.1  CL 103 100  CO2 22 26  GLUCOSE 117* 102*  BUN 11 10  CREATININE 0.64 0.71  CALCIUM 8.8 9.2   Recent Labs    06/19/17 0000  AST 14  ALT 10  BILITOT 1.3*  PROT 6.0*   Recent Labs    06/19/17 0000 01/30/18 0000  WBC 7.6 7.3  HGB 15.2 15.0  HCT 44.6 44.0  MCV 96.5  97.3  PLT 172 187   Lab Results  Component Value Date   TSH 3.60 12/20/2017   Lab Results  Component Value Date   HGBA1C 5.7 (H) 01/30/2018   Lab Results  Component Value Date   CHOL 188 01/30/2018   HDL 64 01/30/2018   LDLCALC 106 (H) 01/30/2018   TRIG 89 01/30/2018   CHOLHDL 2.9 01/30/2018    Significant Diagnostic Results in last 30 days:  No results found.   03/19/18 EKG- sinus bradycardia with HR 54/min, RBBB pattern, No old EKG to compare.   MMSE - Mini Mental State Exam 03/19/2018 06/29/2017 06/26/2017  Not completed: (No Data) - (No Data)  Orientation to time 5 4 4   Orientation to Place 4 5 5   Registration 3 3 3   Attention/ Calculation 5 4 4   Recall 0 2 2  Language- name 2 objects 0 2 2  Language- repeat 1 1 1   Language- follow 3 step command 3 1 1   Language- read & follow direction 1 1 1   Write a sentence 1 1 1   Copy design 1 0 0  Total score 24 24 24     Assessment/Plan  1. Prediabetes Lab Results  Component Value Date   HGBA1C 5.7 (H) 01/30/2018   counselled on cutting down  on sweets and desserts. Walking for activity as tolerated.   2. Overweight (BMI 25.0-29.9) Advised to walk for at least 30 minutes if tolerated 3-4 days a week for now and to watch her calorie intake.   3. Acquired hypothyroidism Reviewed TSH. Continue levothyroxine  4. Dementia without behavioral disturbance, unspecified dementia type Continue donepezil and monitor  5. Bilateral hearing loss, unspecified hearing loss type Pending appt with [facility]  6. Sinus bradycardia by electrocardiogram Asymptomatic, monitor, reviewed EKG  7. Right bundle branch block (RBBB) determined by electrocardiography No old EKG to compare. No AV block noted on EKG today. No known history of syncope per patient. Will obtain echocardiogram to rule out structural abnormality and assess for pulmonary hypertension. Currently asymptomatic. Likely chronic but assess as above. Obtain prior PCP record to compare with EKG, echo if available.   8. Annual physical exam Reviewed labs from 5/19. Dietary and exercise counseling. Walker for ambulation. Labs prior to next visit. Fall precautions. Nursing to assist with medication administration. uptodate on immunization. uptodate on goals of care counseling.    Family/ staff Communication: reviewed care plan with patient and charge nurse. Further visits form now in her AL room.    Labs/tests ordered:  Cbc, cmp, TSH, a1c 06/13/18   Blanchie Serve, MD Internal Medicine [facility] [address] [place], [place] [address] Cell Phone (Monday-Friday 8 am - 5 pm): [phone] On Call: [phone] and follow prompts after 5 pm and on weekends Office Phone: [phone] Office Fax: [phone]

## 2018-03-20 ENCOUNTER — Encounter: Payer: Self-pay | Admitting: Internal Medicine

## 2018-03-20 DIAGNOSIS — R001 Bradycardia, unspecified: Secondary | ICD-10-CM | POA: Diagnosis not present

## 2018-03-20 DIAGNOSIS — M6281 Muscle weakness (generalized): Secondary | ICD-10-CM | POA: Diagnosis not present

## 2018-03-20 DIAGNOSIS — R41841 Cognitive communication deficit: Secondary | ICD-10-CM | POA: Diagnosis not present

## 2018-03-20 DIAGNOSIS — R278 Other lack of coordination: Secondary | ICD-10-CM | POA: Diagnosis not present

## 2018-03-20 DIAGNOSIS — Z7389 Other problems related to life management difficulty: Secondary | ICD-10-CM | POA: Diagnosis not present

## 2018-03-21 DIAGNOSIS — R41841 Cognitive communication deficit: Secondary | ICD-10-CM | POA: Diagnosis not present

## 2018-03-21 DIAGNOSIS — R278 Other lack of coordination: Secondary | ICD-10-CM | POA: Diagnosis not present

## 2018-03-21 DIAGNOSIS — M6281 Muscle weakness (generalized): Secondary | ICD-10-CM | POA: Diagnosis not present

## 2018-03-21 DIAGNOSIS — Z7389 Other problems related to life management difficulty: Secondary | ICD-10-CM | POA: Diagnosis not present

## 2018-03-25 DIAGNOSIS — M6281 Muscle weakness (generalized): Secondary | ICD-10-CM | POA: Diagnosis not present

## 2018-03-25 DIAGNOSIS — R41841 Cognitive communication deficit: Secondary | ICD-10-CM | POA: Diagnosis not present

## 2018-03-25 DIAGNOSIS — R278 Other lack of coordination: Secondary | ICD-10-CM | POA: Diagnosis not present

## 2018-03-25 DIAGNOSIS — Z7389 Other problems related to life management difficulty: Secondary | ICD-10-CM | POA: Diagnosis not present

## 2018-03-27 DIAGNOSIS — Z7389 Other problems related to life management difficulty: Secondary | ICD-10-CM | POA: Diagnosis not present

## 2018-03-27 DIAGNOSIS — M6281 Muscle weakness (generalized): Secondary | ICD-10-CM | POA: Diagnosis not present

## 2018-03-27 DIAGNOSIS — R41841 Cognitive communication deficit: Secondary | ICD-10-CM | POA: Diagnosis not present

## 2018-03-27 DIAGNOSIS — R278 Other lack of coordination: Secondary | ICD-10-CM | POA: Diagnosis not present

## 2018-03-28 ENCOUNTER — Telehealth: Payer: Self-pay

## 2018-03-28 DIAGNOSIS — Z7389 Other problems related to life management difficulty: Secondary | ICD-10-CM | POA: Diagnosis not present

## 2018-03-28 DIAGNOSIS — R278 Other lack of coordination: Secondary | ICD-10-CM | POA: Diagnosis not present

## 2018-03-28 DIAGNOSIS — R41841 Cognitive communication deficit: Secondary | ICD-10-CM | POA: Diagnosis not present

## 2018-03-28 DIAGNOSIS — M6281 Muscle weakness (generalized): Secondary | ICD-10-CM | POA: Diagnosis not present

## 2018-03-28 NOTE — Telephone Encounter (Signed)
Patient called stating she would like to speak with Dr.Pandey and Dr.Pandey only. I asked patient for insight about what the request was pertaining to. Patient was hesitant and stated if I had to know, she is upset about her living arrangements.   Patient was recently moved from being an independent resident to assisted living. Patient states someone came in her room to talk with her today and that made her more upset. Patient did not wish to provide any further details and asked that I forward message to Dr.Pandey to return call.   I confirmed phone number and patient stated she does not even know her phone number for they have moved her and didn't provide her with that information, I tried to recall number from office caller ID and call was disconnected.  After 3-4 min patient returned call and stated she had gotten access to her own number, chart updated. Patient's new number is [phone]  S.Chrae B/CMA

## 2018-03-28 NOTE — Telephone Encounter (Signed)
Spoke with Lisa Barrera and she stated that she has been having some issues with insomnia. When I asked her long has the issue been going on she then stated that it wasn't that concerning. She sounded upset. When I asked her was something else bothering her she redirected the conversation. Patient wants to speak with Dr. Bubba Camp in her room. Please advise.   I attempted to call the [facility] and Clarene Critchley about the situation to see if there was SBAR filled out. Neither one of them where able to answer the phone.

## 2018-04-01 DIAGNOSIS — M6281 Muscle weakness (generalized): Secondary | ICD-10-CM | POA: Diagnosis not present

## 2018-04-01 DIAGNOSIS — R278 Other lack of coordination: Secondary | ICD-10-CM | POA: Diagnosis not present

## 2018-04-01 DIAGNOSIS — R41841 Cognitive communication deficit: Secondary | ICD-10-CM | POA: Diagnosis not present

## 2018-04-01 DIAGNOSIS — Z7389 Other problems related to life management difficulty: Secondary | ICD-10-CM | POA: Diagnosis not present

## 2018-04-03 DIAGNOSIS — R41841 Cognitive communication deficit: Secondary | ICD-10-CM | POA: Diagnosis not present

## 2018-04-03 DIAGNOSIS — Z7389 Other problems related to life management difficulty: Secondary | ICD-10-CM | POA: Diagnosis not present

## 2018-04-03 DIAGNOSIS — M6281 Muscle weakness (generalized): Secondary | ICD-10-CM | POA: Diagnosis not present

## 2018-04-03 DIAGNOSIS — R278 Other lack of coordination: Secondary | ICD-10-CM | POA: Diagnosis not present

## 2018-04-04 DIAGNOSIS — R41841 Cognitive communication deficit: Secondary | ICD-10-CM | POA: Diagnosis not present

## 2018-04-04 DIAGNOSIS — R278 Other lack of coordination: Secondary | ICD-10-CM | POA: Diagnosis not present

## 2018-04-04 DIAGNOSIS — M6281 Muscle weakness (generalized): Secondary | ICD-10-CM | POA: Diagnosis not present

## 2018-04-04 DIAGNOSIS — Z7389 Other problems related to life management difficulty: Secondary | ICD-10-CM | POA: Diagnosis not present

## 2018-04-08 DIAGNOSIS — R41841 Cognitive communication deficit: Secondary | ICD-10-CM | POA: Diagnosis not present

## 2018-04-08 DIAGNOSIS — Z7389 Other problems related to life management difficulty: Secondary | ICD-10-CM | POA: Diagnosis not present

## 2018-04-08 DIAGNOSIS — M6281 Muscle weakness (generalized): Secondary | ICD-10-CM | POA: Diagnosis not present

## 2018-04-08 DIAGNOSIS — R278 Other lack of coordination: Secondary | ICD-10-CM | POA: Diagnosis not present

## 2018-04-10 DIAGNOSIS — Z7389 Other problems related to life management difficulty: Secondary | ICD-10-CM | POA: Diagnosis not present

## 2018-04-10 DIAGNOSIS — R278 Other lack of coordination: Secondary | ICD-10-CM | POA: Diagnosis not present

## 2018-04-10 DIAGNOSIS — M6281 Muscle weakness (generalized): Secondary | ICD-10-CM | POA: Diagnosis not present

## 2018-04-10 DIAGNOSIS — R41841 Cognitive communication deficit: Secondary | ICD-10-CM | POA: Diagnosis not present

## 2018-04-11 DIAGNOSIS — H903 Sensorineural hearing loss, bilateral: Secondary | ICD-10-CM | POA: Diagnosis not present

## 2018-04-12 ENCOUNTER — Encounter: Payer: Self-pay | Admitting: Internal Medicine

## 2018-04-12 DIAGNOSIS — M8580 Other specified disorders of bone density and structure, unspecified site: Secondary | ICD-10-CM | POA: Insufficient documentation

## 2018-04-24 ENCOUNTER — Encounter: Payer: Self-pay | Admitting: Internal Medicine

## 2018-04-30 ENCOUNTER — Encounter: Payer: Medicare Other | Admitting: Internal Medicine

## 2018-05-02 ENCOUNTER — Encounter: Payer: Self-pay | Admitting: *Deleted

## 2018-06-18 DIAGNOSIS — E039 Hypothyroidism, unspecified: Secondary | ICD-10-CM | POA: Diagnosis not present

## 2018-06-18 DIAGNOSIS — F0390 Unspecified dementia without behavioral disturbance: Secondary | ICD-10-CM | POA: Diagnosis not present

## 2018-06-18 DIAGNOSIS — E663 Overweight: Secondary | ICD-10-CM | POA: Diagnosis not present

## 2018-06-18 DIAGNOSIS — R7303 Prediabetes: Secondary | ICD-10-CM | POA: Diagnosis not present

## 2018-06-18 LAB — HEMOGLOBIN A1C: HEMOGLOBIN A1C: 5.7 (ref 4.0–6.0)

## 2018-06-18 LAB — BASIC METABOLIC PANEL
BUN: 11 (ref 4–21)
CREATININE: 0.7 (ref <=1.1)
GLUCOSE: 91
Potassium: 4.2 (ref 3.4–5.3)
Sodium: 133 — AB (ref 137–147)

## 2018-06-18 LAB — HEPATIC FUNCTION PANEL
ALK PHOS: 62 (ref 25–125)
ALT: 9 (ref 7–35)
AST: 15 (ref 13–35)
Bilirubin, Total: 1.1

## 2018-06-18 LAB — CBC AND DIFFERENTIAL
HCT: 45 (ref 36–46)
Hemoglobin: 15.2 (ref 12.0–16.0)
PLATELETS: 206 (ref 150–399)
WBC: 7.4

## 2018-06-18 LAB — TSH: TSH: 5.2 (ref <=5.90)

## 2018-06-19 ENCOUNTER — Non-Acute Institutional Stay: Payer: Medicare Other | Admitting: Nurse Practitioner

## 2018-06-19 ENCOUNTER — Other Ambulatory Visit: Payer: Self-pay | Admitting: *Deleted

## 2018-06-19 ENCOUNTER — Encounter: Payer: Self-pay | Admitting: Nurse Practitioner

## 2018-06-19 DIAGNOSIS — F0390 Unspecified dementia without behavioral disturbance: Secondary | ICD-10-CM

## 2018-06-19 DIAGNOSIS — E039 Hypothyroidism, unspecified: Secondary | ICD-10-CM

## 2018-06-19 DIAGNOSIS — E871 Hypo-osmolality and hyponatremia: Secondary | ICD-10-CM | POA: Diagnosis not present

## 2018-06-19 DIAGNOSIS — R7303 Prediabetes: Secondary | ICD-10-CM | POA: Diagnosis not present

## 2018-06-19 LAB — COMPLETE METABOLIC PANEL WITH GFR
Albumin: 3.7
Calcium: 9
Carbon Dioxide, Total: 21
Chloride: 101
GFR CALC NON AF AMER: 77
GLOBULIN: 2.5
Total Protein: 6.2 g/dL

## 2018-06-19 NOTE — Progress Notes (Signed)
Location:  [facility] Room Number: [place] of Service:  ALF [phone]) Provider:  August Gosser, ManXie  NP  Wardell Honour, MD  Patient Care Team: Wardell Honour, MD as PCP - General (Family Medicine) Jabe Jeanbaptiste X, NP as Nurse Practitioner (Internal Medicine)  Extended Emergency Contact Information Primary Emergency Contact: Pringle,[name] Address: [address]          [place], [place] [address] [name] of [place] Phone: [phone] Mobile Phone: [phone] Relation: Son Secondary Emergency Contact: Pendell,Brett Address: [address]          [place], [place] [address] [name] of [place] Phone: [phone] Work Phone: [phone] Mobile Phone: [phone] Relation: Son  Code Status:  DNR Goals of care: Advanced Directive information Advanced Directives 06/19/2018  Does Patient Have a Medical Advance Directive? Yes  Type of Advance Directive Out of facility DNR (pink MOST or yellow form)  Does patient want to make changes to medical advance directive? No - Patient declined  Copy of [place] in Chart? -  Pre-existing out of facility DNR order (yellow form or pink MOST form) Yellow form placed in chart (order not valid for inpatient use);Pink MOST form placed in chart (order not valid for inpatient use)     Chief Complaint  Patient presents with  . Medical Management of Chronic Issues    F/u-prediabetes, dementia, hypothyroidism, hearing loss    HPI:  Pt is a 84 y.o. female seen today for medical management of chronic diseases.     The patient has Hx of hypothyroidism, on Levothyroxine 57mcg po daily, last TSH 5.20 06/18/18, she denied fatigue, weight gain, cold intolerance, Hx of hyponatremia, mild, serum Na 133 06/18/18. Hx of dementia, resides in AL [facility] for safety and care assistance, on Donepezil 5mg  qd for memory.    Past Medical History:  Diagnosis Date  . Eczema   . Hypothyroid   . Hypothyroidism    . Hypothyroidism   . Vitamin D deficiency    Past Surgical History:  Procedure Laterality Date  . CHOLECYSTECTOMY N/A 06/24/2013   Procedure: LAPAROSCOPIC CHOLECYSTECTOMY WITH INTRAOPERATIVE CHOLANGIOGRAM;  Surgeon: Ralene Ok, MD;  Location: [facility];  Service: General;  Laterality: N/A;  . TONSILLECTOMY      No Known Allergies  Outpatient Encounter Medications as of 06/19/2018  Medication Sig  . aspirin EC 81 MG tablet Take 1 tablet (81 mg total) by mouth daily.  . calcium-vitamin D (OSCAL 500/200 D-3) 500-200 MG-UNIT tablet Take 1 tablet by mouth 2 (two) times daily.  Marland Kitchen donepezil (ARICEPT) 5 MG tablet Take 1 tablet (5 mg total) by mouth at bedtime.  Marland Kitchen levothyroxine (SYNTHROID, LEVOTHROID) 75 MCG tablet Take 1 tablet (75 mcg total) by mouth daily before breakfast.   No facility-administered encounter medications on file as of 06/19/2018.    ROS was provided with assistance of staff Review of Systems  Constitutional: Negative for activity change, appetite change, chills, diaphoresis, fatigue, fever and unexpected weight change.  HENT: Positive for hearing loss. Negative for congestion and voice change.   Respiratory: Negative for cough, shortness of breath and wheezing.   Cardiovascular: Negative for chest pain, palpitations and leg swelling.  Gastrointestinal: Negative for abdominal distention, abdominal pain, constipation, diarrhea, nausea and vomiting.  Genitourinary: Positive for frequency. Negative for difficulty urinating, dysuria and urgency.  Musculoskeletal: Positive for arthralgias and gait problem.  Skin: Negative for color change and pallor.  Neurological: Negative for dizziness, speech difficulty, weakness  and headaches.       Memory lapses.   Psychiatric/Behavioral: Positive for confusion. Negative for agitation, behavioral problems, hallucinations and sleep disturbance. The patient is not nervous/anxious.     Immunization History  Administered Date(s)  Administered  . Influenza,inj,Quad PF,6+ Mos 06/25/2013  . Pneumococcal Conjugate-13 03/11/2014  . Pneumococcal Polysaccharide-23 09/11/1986, 09/11/2001, 06/25/2013  . Zoster Recombinat (Shingrix) 09/12/2007   Pertinent  Health Maintenance Due  Topic Date Due  . INFLUENZA VACCINE  04/11/2018  . DEXA SCAN  Completed  . PNA vac Low Risk Adult  Completed   Fall Risk  10/02/2017  Falls in the past year? Yes  Number falls in past yr: 1  Injury with Fall? No   Functional Status Survey:    Vitals:   06/19/18 1123  BP: 134/76  Pulse: 78  Resp: 19  Temp: 98.6 F (37 C)  SpO2: 96%  Weight: 152 lb (68.9 kg)  Height: 4\' 8"  (1.422 m)   Body mass index is 34.08 kg/m. Physical Exam  Constitutional: She appears well-developed and well-nourished.  HENT:  Head: Normocephalic and atraumatic.  Eyes: Pupils are equal, round, and reactive to light. EOM are normal.  Neck: Normal range of motion. Neck supple. No JVD present. No thyromegaly present.  Cardiovascular: Normal rate and regular rhythm.  Pulmonary/Chest: Effort normal. She has no wheezes. She has no rales.  Abdominal: Soft. She exhibits no distension. There is no tenderness. There is no rebound and no guarding.  Musculoskeletal: She exhibits no edema.  Furniture walking or ambulates with walker.   Neurological: She is alert. No cranial nerve deficit. She exhibits normal muscle tone. Coordination normal.  Oriented to person and place.   Skin: Skin is warm and dry.  Psychiatric: She has a normal mood and affect. Her behavior is normal.    Labs reviewed: Recent Labs    12/20/17 0705 06/18/18  NA 136 133*  K 4.1 4.2  CL 100 101  CO2 26 21  GLUCOSE 102*  --   BUN 10 11  CREATININE 0.71 0.7  CALCIUM 9.2 9.0   Recent Labs    06/18/18  AST 15  ALT 9  ALKPHOS 62  PROT 6.2  ALBUMIN 3.7   Recent Labs    01/30/18 0000 06/18/18  WBC 7.3 7.4  HGB 15.0 15.2  HCT 44.0 45  MCV 97.3  --   PLT 187 206   Lab Results    Component Value Date   TSH 5.20 06/18/2018   Lab Results  Component Value Date   HGBA1C 5.7 06/18/2018   Lab Results  Component Value Date   CHOL 188 01/30/2018   HDL 64 01/30/2018   LDLCALC 106 (H) 01/30/2018   TRIG 89 01/30/2018   CHOLHDL 2.9 01/30/2018    Significant Diagnostic Results in last 30 days:  No results found.  Assessment/Plan Hypothyroidism hypothyroidism, continue  Levothyroxine 67mcg po daily, last TSH 5.20 06/18/18, she denied fatigue, weight gain, cold intolerance, will obtain Free T4 and TSH in 8 weeks.   Dementia without behavioral disturbance (HCC) Hx of dementia, resides in AL [facility] for safety and care assistance, continue  Donepezil 5mg  qd for memory.      Prediabetes 06/18/18 Hgb a1c 5.7, continue diet and exercise  Hyponatremia mild, serum Na 133 06/18/18, observe.      Family/ staff Communication: plan of care reviewed with the patient and charge nurse.   Labs/tests ordered:  TSH Free T4 8 weeks.   Time spend 25 minutes.

## 2018-06-19 NOTE — Assessment & Plan Note (Signed)
hypothyroidism, continue  Levothyroxine 64mcg po daily, last TSH 5.20 06/18/18, she denied fatigue, weight gain, cold intolerance, will obtain Free T4 and TSH in 8 weeks.

## 2018-06-19 NOTE — Assessment & Plan Note (Signed)
mild, serum Na 133 06/18/18, observe.

## 2018-06-19 NOTE — Assessment & Plan Note (Signed)
06/18/18 Hgb a1c 5.7, continue diet and exercise

## 2018-06-19 NOTE — Assessment & Plan Note (Signed)
Hx of dementia, resides in AL [facility] for safety and care assistance, continue  Donepezil 5mg  qd for memory.

## 2018-06-20 ENCOUNTER — Encounter: Payer: Self-pay | Admitting: Nurse Practitioner

## 2018-07-18 ENCOUNTER — Encounter: Payer: Self-pay | Admitting: Nurse Practitioner

## 2018-07-18 ENCOUNTER — Non-Acute Institutional Stay: Payer: Medicare Other | Admitting: Nurse Practitioner

## 2018-07-18 DIAGNOSIS — R350 Frequency of micturition: Secondary | ICD-10-CM | POA: Diagnosis not present

## 2018-07-18 DIAGNOSIS — R300 Dysuria: Secondary | ICD-10-CM | POA: Diagnosis not present

## 2018-07-18 DIAGNOSIS — R32 Unspecified urinary incontinence: Secondary | ICD-10-CM | POA: Diagnosis not present

## 2018-07-18 DIAGNOSIS — F0390 Unspecified dementia without behavioral disturbance: Secondary | ICD-10-CM

## 2018-07-18 DIAGNOSIS — R3915 Urgency of urination: Secondary | ICD-10-CM | POA: Diagnosis not present

## 2018-07-18 NOTE — Assessment & Plan Note (Addendum)
UA C/S to r/o UTI, Pyridium 100mg  tid x 2 days for symptomatic management.

## 2018-07-18 NOTE — Assessment & Plan Note (Signed)
UA C/S to r/o UTI

## 2018-07-18 NOTE — Progress Notes (Signed)
Location:  [place] Room Number: [place] of Service:  SNF [phone]) Provider:  Melda Mermelstein, ManXie  NP  Wardell Honour, MD  Patient Care Team: Wardell Honour, MD as PCP - General (Family Medicine) An Lannan X, NP as Nurse Practitioner (Internal Medicine)  Extended Emergency Contact Information Primary Emergency Contact: Brayman,[name] Address: [address]          [place], [place] [address] [name] of [place] Phone: [phone] Mobile Phone: [phone] Relation: Son Secondary Emergency Contact: Frymire,Brett Address: [address]          [place], [place] [address] [name] of [place] Phone: [phone] Work Phone: [phone] Mobile Phone: [phone] Relation: Son  Code Status:  DNR Goals of care: Advanced Directive information Advanced Directives 07/18/2018  Does Patient Have a Medical Advance Directive? Yes  Type of Advance Directive Out of facility DNR (pink MOST or yellow form)  Does patient want to make changes to medical advance directive? No - Patient declined  Copy of [place] in Chart? -  Pre-existing out of facility DNR order (yellow form or pink MOST form) Yellow form placed in chart (order not valid for inpatient use);Pink MOST form placed in chart (order not valid for inpatient use)     Chief Complaint  Patient presents with  . Acute Visit    C/o- urgency, frequency, and discomfort.    HPI:  Pt is a 84 y.o. female seen today for an acute visit for dysuria, urinary urgency and frequency x 2-3 days. Hx of urinary incontinence, urgency, and frequency 3-4x/ngiht urination. HPI was provided with assistance of the patient's daughter and staff. She is afebrile, denied nausea, vomiting, malaise, or abd/CVA pain. She resides in [place] for safety and care assistance, on Donepezil for memory.   Past Medical History:  Diagnosis Date  . Eczema   . Hypothyroid   . Hypothyroidism   .  Hypothyroidism   . Vitamin D deficiency    Past Surgical History:  Procedure Laterality Date  . CHOLECYSTECTOMY N/A 06/24/2013   Procedure: LAPAROSCOPIC CHOLECYSTECTOMY WITH INTRAOPERATIVE CHOLANGIOGRAM;  Surgeon: Ralene Ok, MD;  Location: [facility];  Service: General;  Laterality: N/A;  . TONSILLECTOMY      No Known Allergies  Outpatient Encounter Medications as of 07/18/2018  Medication Sig  . aspirin EC 81 MG tablet Take 1 tablet (81 mg total) by mouth daily.  . calcium-vitamin D (OSCAL 500/200 D-3) 500-200 MG-UNIT tablet Take 1 tablet by mouth 2 (two) times daily.  Marland Kitchen donepezil (ARICEPT) 5 MG tablet Take 1 tablet (5 mg total) by mouth at bedtime.  Marland Kitchen levothyroxine (SYNTHROID, LEVOTHROID) 75 MCG tablet Take 1 tablet (75 mcg total) by mouth daily before breakfast.   No facility-administered encounter medications on file as of 07/18/2018.    ROS was provided with assistance of staff and family.  Review of Systems  Constitutional: Negative for activity change, appetite change, chills, diaphoresis, fatigue and fever.  HENT: Positive for hearing loss. Negative for congestion and voice change.   Respiratory: Negative for cough, shortness of breath and wheezing.   Cardiovascular: Negative for chest pain, palpitations and leg swelling.  Gastrointestinal: Negative for abdominal distention, abdominal pain, constipation, diarrhea, nausea and vomiting.  Genitourinary: Positive for dysuria, frequency and urgency. Negative for difficulty urinating, flank pain, hematuria, pelvic pain, vaginal bleeding, vaginal discharge and vaginal pain.       Urinary urgency and incontinent of urine.   Musculoskeletal: Positive for  arthralgias and gait problem.  Skin:       Varicose veins.   Neurological: Negative for dizziness, speech difficulty, weakness and headaches.       Memory lapses.   Psychiatric/Behavioral: Positive for confusion and sleep disturbance. Negative for agitation, behavioral problems and  hallucinations. The patient is not nervous/anxious.        Due to urinary frequency    Immunization History  Administered Date(s) Administered  . Influenza Whole 06/15/2018  . Influenza,inj,Quad PF,6+ Mos 06/25/2013  . Pneumococcal Conjugate-13 03/11/2014  . Pneumococcal Polysaccharide-23 09/11/1986, 09/11/2001, 06/25/2013  . Zoster Recombinat (Shingrix) 09/12/2007   Pertinent  Health Maintenance Due  Topic Date Due  . INFLUENZA VACCINE  Completed  . DEXA SCAN  Completed  . PNA vac Low Risk Adult  Completed   Fall Risk  10/02/2017  Falls in the past year? Yes  Number falls in past yr: 1  Injury with Fall? No   Functional Status Survey:    Vitals:   07/18/18 1002  BP: 126/68  Pulse: 64  Resp: 16  Temp: 98.1 F (36.7 C)  SpO2: 94%  Weight: 153 lb (69.4 kg)  Height: 4\' 8"  (1.422 m)   Body mass index is 34.3 kg/m. Physical Exam  Constitutional: She appears well-developed and well-nourished.  HENT:  Head: Normocephalic and atraumatic.  Eyes: Pupils are equal, round, and reactive to light. EOM are normal.  Neck: Normal range of motion. Neck supple. No JVD present. No thyromegaly present.  Cardiovascular: Normal rate and regular rhythm.  No murmur heard. Pulmonary/Chest: She has no wheezes. She has no rales.  Abdominal: Soft. Bowel sounds are normal. She exhibits no distension. There is no tenderness. There is no rebound and no guarding. A hernia is present.  Umbilical hernia  Genitourinary: Vagina normal. No vaginal discharge found.  Musculoskeletal: Normal range of motion.  Unsteady gait.   Neurological: She is alert. No cranial nerve deficit. She exhibits normal muscle tone. Coordination normal.  Oriented to person and place.   Skin: Skin is warm and dry.  Varicose veins in BLE  Psychiatric: She has a normal mood and affect. Her behavior is normal.    Labs reviewed: Recent Labs    12/20/17 0705 06/18/18  NA 136 133*  K 4.1 4.2  CL 100 101  CO2 26 21    GLUCOSE 102*  --   BUN 10 11  CREATININE 0.71 0.7  CALCIUM 9.2 9.0   Recent Labs    06/18/18  AST 15  ALT 9  ALKPHOS 62  PROT 6.2  ALBUMIN 3.7   Recent Labs    01/30/18 0000 06/18/18  WBC 7.3 7.4  HGB 15.0 15.2  HCT 44.0 45  MCV 97.3  --   PLT 187 206   Lab Results  Component Value Date   TSH 5.20 06/18/2018   Lab Results  Component Value Date   HGBA1C 5.7 06/18/2018   Lab Results  Component Value Date   CHOL 188 01/30/2018   HDL 64 01/30/2018   LDLCALC 106 (H) 01/30/2018   TRIG 89 01/30/2018   CHOLHDL 2.9 01/30/2018    Significant Diagnostic Results in last 30 days:  No results found.  Assessment/Plan Incontinent of urine UA C/S to r/o UTI  Dysuria UA C/S to r/o UTI, Pyridium 100mg  tid x 2 days for symptomatic management.   Urinary urgency UA C/S to r/o UTI  Urinary frequency UA C/S to r/o UTI  Dementia without behavioral disturbance ([place]) Continue [facility]  for safety and care assistance, continue Donepezil for memory.      Family/ staff Communication: plan of care reviewed with the patient and charge nurse.   Labs/tests ordered:  UA C/S  Time spend 25 minutes.

## 2018-07-18 NOTE — Assessment & Plan Note (Signed)
Continue [facility] for safety and care assistance, continue Donepezil for memory.  

## 2018-07-19 ENCOUNTER — Encounter: Payer: Self-pay | Admitting: Nurse Practitioner

## 2018-07-19 DIAGNOSIS — R350 Frequency of micturition: Secondary | ICD-10-CM | POA: Diagnosis not present

## 2018-08-15 DIAGNOSIS — I10 Essential (primary) hypertension: Secondary | ICD-10-CM | POA: Diagnosis not present

## 2018-08-15 LAB — TSH: TSH: 5.3 (ref <=5.90)

## 2018-08-27 ENCOUNTER — Encounter: Payer: Self-pay | Admitting: Nurse Practitioner

## 2018-08-27 ENCOUNTER — Non-Acute Institutional Stay: Payer: Medicare Other | Admitting: Nurse Practitioner

## 2018-08-27 DIAGNOSIS — R7303 Prediabetes: Secondary | ICD-10-CM

## 2018-08-27 DIAGNOSIS — E039 Hypothyroidism, unspecified: Secondary | ICD-10-CM | POA: Diagnosis not present

## 2018-08-27 DIAGNOSIS — F0390 Unspecified dementia without behavioral disturbance: Secondary | ICD-10-CM

## 2018-08-27 DIAGNOSIS — R350 Frequency of micturition: Secondary | ICD-10-CM

## 2018-08-27 NOTE — Progress Notes (Signed)
Location:  [facility] Room Number: [place] of Service:  ALF [phone]) Provider:  Tykiera Raven, ManXie  NP  Wardell Honour, MD  Patient Care Team: Wardell Honour, MD as PCP - General (Family Medicine) Verlie Liotta X, NP as Nurse Practitioner (Internal Medicine)  Extended Emergency Contact Information Primary Emergency Contact: Krager,[name] Address: [address]          [place], [place] [address] [name] of [place] Phone: [phone] Mobile Phone: [phone] Relation: Son Secondary Emergency Contact: Schuitema,Brett Address: [address]          [place], [place] [address] [name] of [place] Phone: [phone] Work Phone: [phone] Mobile Phone: [phone] Relation: Son  Code Status:  DNR Goals of care: Advanced Directive information Advanced Directives 08/27/2018  Does Patient Have a Medical Advance Directive? Yes  Type of Par[place] of [place];Out of facility DNR (pink MOST or yellow form)  Does patient want to make changes to medical advance directive? No - Patient declined  Copy of [place] in Chart? Yes - validated most recent copy scanned in chart (See row information)  Pre-existing out of facility DNR order (yellow form or pink MOST form) Yellow form placed in chart (order not valid for inpatient use);Pink MOST form placed in chart (order not valid for inpatient use)     Chief Complaint  Patient presents with  . Medical Management of Chronic Issues    HPI:  Pt is a 84 y.o. female seen today for medical management of chronic diseases.     The patient has history of nocturnal urinary frequency, 3-4x/night bathroom trips, able to return to sleep and rest. She resides in AL [facility] for safety and care assistance, on Donepezil 5mg  qd for memory. Hx of hypothyroidism, on Levothyroxine 35mcg qd, last TSH 5.3 08/15/18, 5.2 06/19/18.   Past Medical History:  Diagnosis Date  . Eczema     . Hypothyroid   . Hypothyroidism   . Hypothyroidism   . Vitamin D deficiency    Past Surgical History:  Procedure Laterality Date  . CHOLECYSTECTOMY N/A 06/24/2013   Procedure: LAPAROSCOPIC CHOLECYSTECTOMY WITH INTRAOPERATIVE CHOLANGIOGRAM;  Surgeon: Ralene Ok, MD;  Location: [facility];  Service: General;  Laterality: N/A;  . TONSILLECTOMY      No Known Allergies  Outpatient Encounter Medications as of 08/27/2018  Medication Sig  . aspirin EC 81 MG tablet Take 1 tablet (81 mg total) by mouth daily.  . calcium-vitamin D (OSCAL 500/200 D-3) 500-200 MG-UNIT tablet Take 1 tablet by mouth 2 (two) times daily.  Marland Kitchen donepezil (ARICEPT) 5 MG tablet Take 1 tablet (5 mg total) by mouth at bedtime.  Marland Kitchen levothyroxine (SYNTHROID, LEVOTHROID) 75 MCG tablet Take 1 tablet (75 mcg total) by mouth daily before breakfast.   No facility-administered encounter medications on file as of 08/27/2018.     Review of Systems  Constitutional: Negative for activity change, appetite change, chills, diaphoresis, fatigue, fever and unexpected weight change.  HENT: Positive for hearing loss. Negative for congestion and voice change.   Respiratory: Negative for cough, shortness of breath and wheezing.   Cardiovascular: Positive for leg swelling. Negative for chest pain.  Gastrointestinal: Negative for abdominal distention, abdominal pain, constipation, diarrhea, nausea and vomiting.  Genitourinary: Positive for frequency. Negative for difficulty urinating, dysuria and urgency.  Musculoskeletal: Positive for arthralgias and gait problem.  Skin: Negative for color change and pallor.  Neurological: Negative for dizziness, speech difficulty, weakness and  headaches.       Memory lapses  Psychiatric/Behavioral: Negative for agitation, behavioral problems, hallucinations and sleep disturbance. The patient is not nervous/anxious.     Immunization History  Administered Date(s) Administered  . Influenza Whole  06/15/2018  . Influenza,inj,Quad PF,6+ Mos 06/25/2013  . Pneumococcal Conjugate-13 03/11/2014  . Pneumococcal Polysaccharide-23 09/11/1986, 09/11/2001, 06/25/2013  . Zoster Recombinat (Shingrix) 09/12/2007   Pertinent  Health Maintenance Due  Topic Date Due  . INFLUENZA VACCINE  Completed  . DEXA SCAN  Completed  . PNA vac Low Risk Adult  Completed   Fall Risk  10/02/2017  Falls in the past year? Yes  Number falls in past yr: 1  Injury with Fall? No   Functional Status Survey:    Vitals:   08/27/18 1042  BP: 126/64  Pulse: 72  Resp: 18  Temp: 98 F (36.7 C)  SpO2: 94%  Weight: 152 lb (68.9 kg)  Height: 4\' 8"  (1.422 m)   Body mass index is 34.08 kg/m. Physical Exam Constitutional:      General: She is not in acute distress.    Appearance: Normal appearance. She is not diaphoretic.     Comments: Over weight  HENT:     Head: Normocephalic and atraumatic.     Nose: Nose normal.     Mouth/Throat:     Mouth: Mucous membranes are moist.  Eyes:     Extraocular Movements: Extraocular movements intact.     Pupils: Pupils are equal, round, and reactive to light.  Neck:     Musculoskeletal: Normal range of motion and neck supple.  Cardiovascular:     Rate and Rhythm: Normal rate and regular rhythm.     Heart sounds: No murmur.  Pulmonary:     Effort: Pulmonary effort is normal.     Breath sounds: Normal breath sounds. No wheezing or rales.  Abdominal:     General: There is no distension.     Palpations: Abdomen is soft.     Tenderness: There is no abdominal tenderness. There is no guarding or rebound.     Hernia: A hernia is present.     Comments: Umbilical hernia  Musculoskeletal:        General: Swelling present.     Comments: Trace edema BLE. Ambulates with walker.   Skin:    General: Skin is warm and dry.     Findings: No erythema or rash.     Comments: Varicose veins BLE  Neurological:     General: No focal deficit present.     Mental Status: She is alert.  Mental status is at baseline.     Motor: No weakness.     Coordination: Coordination normal.     Gait: Gait abnormal.     Comments: Oriented to person and place.   Psychiatric:        Mood and Affect: Mood normal.        Behavior: Behavior normal.     Labs reviewed: Recent Labs    12/20/17 0705 06/18/18  NA 136 133*  K 4.1 4.2  CL 100 101  CO2 26 21  GLUCOSE 102*  --   BUN 10 11  CREATININE 0.71 0.7  CALCIUM 9.2 9.0   Recent Labs    06/18/18  AST 15  ALT 9  ALKPHOS 62  PROT 6.2  ALBUMIN 3.7   Recent Labs    01/30/18 0000 06/18/18  WBC 7.3 7.4  HGB 15.0 15.2  HCT 44.0 45  MCV  97.3  --   PLT 187 206   Lab Results  Component Value Date   TSH 5.20 06/18/2018   Lab Results  Component Value Date   HGBA1C 5.7 06/18/2018   Lab Results  Component Value Date   CHOL 188 01/30/2018   HDL 64 01/30/2018   LDLCALC 106 (H) 01/30/2018   TRIG 89 01/30/2018   CHOLHDL 2.9 01/30/2018    Significant Diagnostic Results in last 30 days:  No results found.  Assessment/Plan Urinary frequency Chronic, not been evaluated by Urology, she is able to sleep and participate activities, B vs R. Will observe. May pursue Urology consultation, pharmacotherapy.   Dementia without behavioral disturbance ([place]) Continue [facility] for safety and care assistance. Continue Donepezil 5mg  qd.   Hypothyroidism Mildly elevated TSH 5s x2 in the past 3 months. Will increase Levothyroxine 184mcg po qd, update TSH and free T4 in 12 weeks.   Prediabetes 06/19/18 Hgb a1c 5.7, continue diet and exercise. Observe.      Family/ staff Communication: plan of care reviewed with the patient and charge nurse.   Labs/tests ordered: TSH free T4 12 weeks  Time spend 25 minutes

## 2018-08-27 NOTE — Assessment & Plan Note (Signed)
06/19/18 Hgb a1c 5.7, continue diet and exercise. Observe.

## 2018-08-27 NOTE — Assessment & Plan Note (Signed)
Chronic, not been evaluated by Urology, she is able to sleep and participate activities, B vs R. Will observe. May pursue Urology consultation, pharmacotherapy.

## 2018-08-27 NOTE — Assessment & Plan Note (Signed)
Mildly elevated TSH 5s x2 in the past 3 months. Will increase Levothyroxine 192mcg po qd, update TSH and free T4 in 12 weeks.

## 2018-08-27 NOTE — Assessment & Plan Note (Signed)
Continue [facility] for safety and care assistance. Continue Donepezil 5mg  qd.

## 2018-09-10 DIAGNOSIS — E039 Hypothyroidism, unspecified: Secondary | ICD-10-CM | POA: Diagnosis not present

## 2018-09-10 LAB — TSH: TSH: 2.43 (ref <=5.90)

## 2018-09-13 ENCOUNTER — Other Ambulatory Visit: Payer: Self-pay | Admitting: *Deleted

## 2018-11-19 DIAGNOSIS — E039 Hypothyroidism, unspecified: Secondary | ICD-10-CM | POA: Diagnosis not present

## 2018-11-19 LAB — TSH: TSH: 0.69 (ref <=5.90)

## 2018-11-20 ENCOUNTER — Encounter: Payer: Self-pay | Admitting: Nurse Practitioner

## 2018-11-20 ENCOUNTER — Non-Acute Institutional Stay: Payer: Medicare Other | Admitting: Nurse Practitioner

## 2018-11-20 DIAGNOSIS — F0390 Unspecified dementia without behavioral disturbance: Secondary | ICD-10-CM

## 2018-11-20 DIAGNOSIS — E871 Hypo-osmolality and hyponatremia: Secondary | ICD-10-CM

## 2018-11-20 DIAGNOSIS — J209 Acute bronchitis, unspecified: Secondary | ICD-10-CM | POA: Diagnosis not present

## 2018-11-20 DIAGNOSIS — R05 Cough: Secondary | ICD-10-CM | POA: Diagnosis not present

## 2018-11-20 DIAGNOSIS — I10 Essential (primary) hypertension: Secondary | ICD-10-CM | POA: Diagnosis not present

## 2018-11-20 LAB — HEPATIC FUNCTION PANEL
ALT: 12 (ref 7–35)
AST: 19 (ref 13–35)
Alkaline Phosphatase: 59 (ref 25–125)
Bilirubin, Total: 1

## 2018-11-20 LAB — BASIC METABOLIC PANEL
BUN: 14 (ref 4–21)
Creatinine: 0.8 (ref <=1.1)
Glucose: 92
Potassium: 4.3 (ref 3.4–5.3)
Sodium: 132 — AB (ref 137–147)

## 2018-11-20 LAB — CBC AND DIFFERENTIAL
HEMATOCRIT: 45 (ref 36–46)
Hemoglobin: 15.5 (ref 12.0–16.0)
PLATELETS: 134 — AB (ref 150–399)
WBC: 10.4

## 2018-11-20 LAB — T4 (THYROXINE), TOTAL (REFL): T4,Free (Direct): 1.6

## 2018-11-20 NOTE — Assessment & Plan Note (Signed)
Cough, fever x 2 days, Tylenol '650mg'$  q8h prn, Duoneb q8h x72 hours. Mucinex '600mg'$  bid x 5 days, obtain CXR/ap, CBC/diff, CMP/eGFR. Flu test nasal swab negative. May consider ABT if no better.

## 2018-11-20 NOTE — Progress Notes (Signed)
Location:  [facility] Room Number: [place] of Service:  ALF [phone]) Provider:  Jenaro Souder, ManXie  NP  Wardell Honour, MD  Patient Care Team: Wardell Honour, MD as PCP - General (Family Medicine) Kia Stavros X, NP as Nurse Practitioner (Internal Medicine)  Extended Emergency Contact Information Primary Emergency Contact: Steier,[name] Address: [address]          [place], [place] [address] [name] of [place] Phone: [phone] Mobile Phone: [phone] Relation: Son Secondary Emergency Contact: Trevathan,Brett Address: [address]          [place], [place] [address] [name] of [place] Phone: [phone] Work Phone: [phone] Mobile Phone: [phone] Relation: Son  Code Status:  DNR Goals of care: Advanced Directive information Advanced Directives 11/20/2018  Does Patient Have a Medical Advance Directive? Yes  Type of Paramedic of [place];Living will;Out of facility DNR (pink MOST or yellow form)  Does patient want to make changes to medical advance directive? No - Patient declined  Copy of [place] in Chart? Yes - validated most recent copy scanned in chart (See row information)  Pre-existing out of facility DNR order (yellow form or pink MOST form) Yellow form placed in chart (order not valid for inpatient use);Pink MOST form placed in chart (order not valid for inpatient use)     Chief Complaint  Patient presents with  . Acute Visit    C/o- cough and fever    HPI:  Pt is a 85 y.o. female seen today for an acute visit for cough, fever x 2 days, no O2 desaturation. She denied nasal congestion, headache, vision change, chest pain/pressure, palpitation, nausea, vomiting, abd pain, dysuria. Hx of dementia, resides in [facility] for safety an care assistance, on Donepezil 4m qd for memory   Past Medical History:  Diagnosis Date  . Eczema   . Hypothyroid   . Hypothyroidism    . Hypothyroidism   . Vitamin D deficiency    Past Surgical History:  Procedure Laterality Date  . CHOLECYSTECTOMY N/A 06/24/2013   Procedure: LAPAROSCOPIC CHOLECYSTECTOMY WITH INTRAOPERATIVE CHOLANGIOGRAM;  Surgeon: ARalene Ok MD;  Location: [facility]  Service: General;  Laterality: N/A;  . TONSILLECTOMY      No Known Allergies  Outpatient Encounter Medications as of 11/20/2018  Medication Sig  . acetaminophen (TYLENOL) 325 MG tablet Take 650 mg by mouth every 4 (four) hours as needed (For fever greater than 100.6 x48 hours. Notify MD of initial fever and temperature greater than 101. Do not exceed 3,000 mg in 24 hours.).  .Marland Kitchenaspirin EC 81 MG tablet Take 1 tablet (81 mg total) by mouth daily.  . calcium-vitamin D (OSCAL 500/200 D-3) 500-200 MG-UNIT tablet Take 1 tablet by mouth 2 (two) times daily.  .Marland KitchenDextromethorphan-guaiFENesin (ROBAFEN DM) 10-100 MG/5ML liquid Take 5 mLs by mouth every 6 (six) hours as needed (For cough X 48 hours (Use sugar-free for diabetic patients.) Notify MD of continued cough.).  .Marland Kitchendonepezil (ARICEPT) 5 MG tablet Take 1 tablet (5 mg total) by mouth at bedtime.  .Marland Kitchenlevothyroxine (SYNTHROID, LEVOTHROID) 100 MCG tablet Take 100 mcg by mouth daily before breakfast.  . [DISCONTINUED] levothyroxine (SYNTHROID, LEVOTHROID) 75 MCG tablet Take 1 tablet (75 mcg total) by mouth daily before breakfast.   No facility-administered encounter medications on file as of 11/20/2018.    ROS was provided with assistance of staff.  Review of Systems  Constitutional: Positive for fatigue and  fever. Negative for activity change, appetite change, chills and diaphoresis.  HENT: Positive for hearing loss. Negative for congestion and voice change.   Respiratory: Positive for cough, shortness of breath and wheezing.   Cardiovascular: Positive for leg swelling. Negative for chest pain and palpitations.  Gastrointestinal: Negative for abdominal distention, abdominal pain, constipation,  diarrhea, nausea and vomiting.  Genitourinary: Positive for frequency. Negative for difficulty urinating, dysuria and urgency.  Musculoskeletal: Positive for gait problem.  Skin: Negative for color change and pallor.  Neurological: Negative for dizziness, speech difficulty and headaches.       Dementia  Psychiatric/Behavioral: Negative for agitation, behavioral problems, hallucinations and sleep disturbance. The patient is not nervous/anxious.     Immunization History  Administered Date(s) Administered  . Influenza Whole 06/15/2018  . Influenza,inj,Quad PF,6+ Mos 06/25/2013  . Pneumococcal Conjugate-13 03/11/2014  . Pneumococcal Polysaccharide-23 09/11/1986, 09/11/2001, 06/25/2013  . Zoster Recombinat (Shingrix) 09/12/2007   Pertinent  Health Maintenance Due  Topic Date Due  . INFLUENZA VACCINE  Completed  . DEXA SCAN  Completed  . PNA vac Low Risk Adult  Completed   Fall Risk  10/02/2017  Falls in the past year? Yes  Number falls in past yr: 1  Injury with Fall? No   Functional Status Survey:    Vitals:   11/20/18 1025  BP: 118/60  Pulse: 75  Resp: 20  Temp: (!) 100.4 F (38 C)  SpO2: 94%  Weight: 153 lb 12.8 oz (69.8 kg)  Height: _0  (1.422 m)   Body mass index is 34.48 kg/m. Physical Exam Constitutional:      General: She is not in acute distress.    Appearance: Normal appearance. She is obese. She is not ill-appearing, toxic-appearing or diaphoretic.  HENT:     Head: Normocephalic and atraumatic.     Nose: Nose normal. No congestion or rhinorrhea.     Mouth/Throat:     Mouth: Mucous membranes are moist.  Eyes:     Extraocular Movements: Extraocular movements intact.     Pupils: Pupils are equal, round, and reactive to light.  Neck:     Musculoskeletal: Normal range of motion and neck supple.  Cardiovascular:     Rate and Rhythm: Normal rate and regular rhythm.     Heart sounds: No murmur.  Pulmonary:     Breath sounds: Wheezing, rhonchi and rales  present.     Comments: Posterior left lung >right lung Abdominal:     General: There is no distension.     Palpations: Abdomen is soft.     Tenderness: There is no abdominal tenderness. There is no right CVA tenderness, left CVA tenderness, guarding or rebound.  Musculoskeletal:     Right lower leg: Edema present.     Left lower leg: Edema present.     Comments: Trace edema BLE. Ambulates with walker.   Skin:    General: Skin is warm and dry.  Neurological:     General: No focal deficit present.     Mental Status: She is alert. Mental status is at baseline.     Cranial Nerves: No cranial nerve deficit.     Motor: No weakness.     Coordination: Coordination normal.     Gait: Gait abnormal.     Comments: Oriented to person and place.   Psychiatric:        Mood and Affect: Mood normal.        Behavior: Behavior normal.     Labs reviewed: Recent Labs  12/20/17 0705 06/18/18  NA 136 133*  K 4.1 4.2  CL 100 101  CO2 26 21  GLUCOSE 102*  --   BUN 10 11  CREATININE 0.71 0.7  CALCIUM 9.2 9.0   Recent Labs    06/18/18  AST 15  ALT 9  ALKPHOS 62  PROT 6.2  ALBUMIN 3.7   Recent Labs    01/30/18 0000 06/18/18  WBC 7.3 7.4  HGB 15.0 15.2  HCT 44.0 45  MCV 97.3  --   PLT 187 206   Lab Results  Component Value Date   TSH 0.69 11/19/2018   Lab Results  Component Value Date   HGBA1C 5.7 06/18/2018   Lab Results  Component Value Date   CHOL 188 01/30/2018   HDL 64 01/30/2018   LDLCALC 106 (H) 01/30/2018   TRIG 89 01/30/2018   CHOLHDL 2.9 01/30/2018    Significant Diagnostic Results in last 30 days:  No results found.  Assessment/Plan Acute bronchitis Cough, fever x 2 days, Tylenol 618m q8h prn, Duoneb q8h x72 hours. Mucinex 6051mbid x 5 days, obtain CXR/ap, CBC/diff, CMP/eGFR. Flu test nasal swab negative. May consider ABT if no better.   Dementia without behavioral disturbance ([place] [facility] care [facility], continue Donepezil  59m31md.   Hyponatremia 11/20/18 wbc 10.4, Hgb 15.5, plt 134, neutrophils 60.5. Na 132, K 4.3, Bun 14, creat 0.77, TP 6.1, albumin 3.7.      Family/ staff Communication: plan of care reviewed with the patient and charge nurse.   Labs/tests ordered:  CBC/diff, CMP/eGFR, CXR/ap  Time spend 25 minutes.

## 2018-11-20 NOTE — Assessment & Plan Note (Signed)
11/20/18 wbc 10.4, Hgb 15.5, plt 134, neutrophils 60.5. Na 132, K 4.3, Bun 14, creat 0.77, TP 6.1, albumin 3.7.

## 2018-11-20 NOTE — Assessment & Plan Note (Signed)
Continue [facility] for safety and care assistance, continue Donepezil 5mg  qd.

## 2018-11-21 ENCOUNTER — Encounter: Payer: Self-pay | Admitting: Nurse Practitioner

## 2018-11-25 ENCOUNTER — Encounter: Payer: Self-pay | Admitting: Nurse Practitioner

## 2018-11-25 ENCOUNTER — Non-Acute Institutional Stay: Payer: Medicare Other | Admitting: Nurse Practitioner

## 2018-11-25 DIAGNOSIS — J209 Acute bronchitis, unspecified: Secondary | ICD-10-CM | POA: Diagnosis not present

## 2018-11-25 DIAGNOSIS — F0390 Unspecified dementia without behavioral disturbance: Secondary | ICD-10-CM | POA: Diagnosis not present

## 2018-11-25 DIAGNOSIS — E039 Hypothyroidism, unspecified: Secondary | ICD-10-CM

## 2018-11-25 LAB — COMPLETE METABOLIC PANEL WITH GFR
Albumin: 3.7
Calcium: 9
Carbon Dioxide, Total: 23
Chloride: 99
EGFR (Non-African Amer.): 68
Globulin: 2.4

## 2018-11-25 NOTE — Assessment & Plan Note (Addendum)
Resolving. 11/20/18 CXR no evidence of acute pulmonary disease.

## 2018-11-25 NOTE — Progress Notes (Signed)
Location:  [facility] Room Number: [place] of Service:  ALF (13) Provider:Mast, ManXie    NP  Wardell Honour, MD  Patient Care Team: Wardell Honour, MD as PCP - General (Family Medicine) Mast, Man X, NP as Nurse Practitioner (Internal Medicine)  Extended Emergency Contact Information Primary Emergency Contact: Ericksen,[name] Address: [address]          [place], [place] [address] [name] of [place] Phone: [phone] Mobile Phone: [phone] Relation: Son Secondary Emergency Contact: Dumm,Brett Address: [address]          [place], [place] [address] [name] of [place] Phone: [phone] Work Phone: [phone] Mobile Phone: [phone] Relation: Son  Code Status:  DNR Goals of care: Advanced Directive information Advanced Directives 11/20/2018  Does Patient Have a Medical Advance Directive? Yes  Type of Paramedic of [place];Living will;Out of facility DNR (pink MOST or yellow form)  Does patient want to make changes to medical advance directive? No - Patient declined  Copy of [place] in Chart? Yes - validated most recent copy scanned in chart (See row information)  Pre-existing out of facility DNR order (yellow form or pink MOST form) Yellow form placed in chart (order not valid for inpatient use);Pink MOST form placed in chart (order not valid for inpatient use)     Chief Complaint  Patient presents with  . Medical Management of Chronic Issues    HPI:  Pt is a 85 y.o. female seen today for medical management of chronic diseases.    The patient resides in [place] for safety and care assistance, taking Donepezil 5mg  qd for memory. Hx of hypothyroidism, on Levothyroxine 175mcg qd, last TSH 0.69 11/19/18. Resolved acute bronchitis, treated with Mucinex, DuoNeb, Tylenol.    Past Medical History:  Diagnosis Date  . Eczema   . Hypothyroid   . Hypothyroidism    . Hypothyroidism   . Vitamin D deficiency    Past Surgical History:  Procedure Laterality Date  . CHOLECYSTECTOMY N/A 06/24/2013   Procedure: LAPAROSCOPIC CHOLECYSTECTOMY WITH INTRAOPERATIVE CHOLANGIOGRAM;  Surgeon: Ralene Ok, MD;  Location: [facility];  Service: General;  Laterality: N/A;  . TONSILLECTOMY      No Known Allergies  Outpatient Encounter Medications as of 11/25/2018  Medication Sig  . aspirin EC 81 MG tablet Take 1 tablet (81 mg total) by mouth daily.  . calcium-vitamin D (OSCAL 500/200 D-3) 500-200 MG-UNIT tablet Take 1 tablet by mouth 2 (two) times daily.  Marland Kitchen donepezil (ARICEPT) 5 MG tablet Take 1 tablet (5 mg total) by mouth at bedtime.  Marland Kitchen levothyroxine (SYNTHROID, LEVOTHROID) 100 MCG tablet Take 100 mcg by mouth daily before breakfast.  . [DISCONTINUED] acetaminophen (TYLENOL) 325 MG tablet Take 650 mg by mouth every 6 (six) hours as needed.   No facility-administered encounter medications on file as of 11/25/2018.    ROS was provided with assistance of staff.  Review of Systems  Constitutional: Negative for activity change, appetite change, chills, diaphoresis, fatigue, fever and unexpected weight change.  HENT: Positive for hearing loss. Negative for congestion and voice change.   Respiratory: Positive for cough. Negative for shortness of breath and wheezing.   Cardiovascular: Positive for leg swelling. Negative for chest pain and palpitations.  Gastrointestinal: Negative for abdominal distention, abdominal pain, constipation, diarrhea, nausea and vomiting.  Genitourinary: Positive for frequency. Negative for difficulty urinating, dysuria and urgency.  Musculoskeletal: Positive for gait problem. Negative for arthralgias  and back pain.  Skin: Negative for color change and pallor.  Neurological: Negative for dizziness, facial asymmetry, speech difficulty, weakness and headaches.       Dementia  Psychiatric/Behavioral: Negative for agitation, behavioral problems,  hallucinations and sleep disturbance. The patient is not nervous/anxious.     Immunization History  Administered Date(s) Administered  . Influenza Whole 06/15/2018  . Influenza,inj,Quad PF,6+ Mos 06/25/2013  . Pneumococcal Conjugate-13 03/11/2014  . Pneumococcal Polysaccharide-23 09/11/1986, 09/11/2001, 06/25/2013  . Zoster Recombinat (Shingrix) 09/12/2007   Pertinent  Health Maintenance Due  Topic Date Due  . INFLUENZA VACCINE  Completed  . DEXA SCAN  Completed  . PNA vac Low Risk Adult  Completed   Fall Risk  10/02/2017  Falls in the past year? Yes  Number falls in past yr: 1  Injury with Fall? No   Functional Status Survey:    Vitals:   11/25/18 1017  BP: (!) 156/72  Pulse: 66  Resp: 19  Temp: 98.6 F (37 C)  SpO2: 94%  Weight: 153 lb 12.8 oz (69.8 kg)  Height: 4\' 8"  (1.422 m)   Body mass index is 34.48 kg/m. Physical Exam Constitutional:      General: She is not in acute distress.    Appearance: Normal appearance. She is obese. She is not ill-appearing, toxic-appearing or diaphoretic.  HENT:     Head: Normocephalic and atraumatic.     Nose: Nose normal. No congestion or rhinorrhea.     Mouth/Throat:     Mouth: Mucous membranes are moist.     Pharynx: No oropharyngeal exudate or posterior oropharyngeal erythema.  Eyes:     Extraocular Movements: Extraocular movements intact.     Conjunctiva/sclera: Conjunctivae normal.     Pupils: Pupils are equal, round, and reactive to light.  Neck:     Musculoskeletal: Normal range of motion and neck supple.  Cardiovascular:     Rate and Rhythm: Normal rate and regular rhythm.     Heart sounds: No murmur.  Pulmonary:     Effort: Pulmonary effort is normal.     Breath sounds: Rales present. No wheezing or rhonchi.     Comments: Posterior mid to lower lungs rales.  Abdominal:     General: Bowel sounds are normal. There is no distension.     Palpations: Abdomen is soft.     Tenderness: There is no abdominal  tenderness. There is no guarding or rebound.  Musculoskeletal:     Right lower leg: Edema present.     Left lower leg: Edema present.     Comments: Trace edema BLE.   Skin:    General: Skin is warm and dry.  Neurological:     General: No focal deficit present.     Mental Status: She is alert. Mental status is at baseline.     Cranial Nerves: No cranial nerve deficit.     Motor: No weakness.     Coordination: Coordination normal.     Gait: Gait abnormal.     Comments: Oriented to person and place. Ambulates with walker.   Psychiatric:        Mood and Affect: Mood normal.        Behavior: Behavior normal.        Thought Content: Thought content normal.     Labs reviewed: Recent Labs    12/20/17 0705 06/18/18 11/20/18  NA 136 133* 132*  K 4.1 4.2 4.3  CL 100 101 99  CO2 26 21 23   GLUCOSE 102*  --   --  BUN 10 11 14   CREATININE 0.71 0.7 0.8  CALCIUM 9.2 9.0 9.0   Recent Labs    06/18/18 11/20/18  AST 15 19  ALT 9 12  ALKPHOS 62 59  PROT 6.2  --   ALBUMIN 3.7 3.7   Recent Labs    01/30/18 0000 06/18/18 11/20/18  WBC 7.3 7.4 10.4  HGB 15.0 15.2 15.5  HCT 44.0 45 45  MCV 97.3  --   --   PLT 187 206 134*   Lab Results  Component Value Date   TSH 0.69 11/19/2018   Lab Results  Component Value Date   HGBA1C 5.7 06/18/2018   Lab Results  Component Value Date   CHOL 188 01/30/2018   HDL 64 01/30/2018   LDLCALC 106 (H) 01/30/2018   TRIG 89 01/30/2018   CHOLHDL 2.9 01/30/2018    Significant Diagnostic Results in last 30 days:  No results found.  Assessment/Plan Acute bronchitis Resolving. 11/20/18 CXR no evidence of acute pulmonary disease.   Hypothyroidism Stable, continue Levothyroxine 130mcg qd, last TSH wnl 0.69 11/19/18  Dementia without behavioral disturbance ([place]) Continue [facility] for safety and care assistance, continue Donepezil 5mg  qd.      Family/ staff Communication: plan of care reviewed with the patient and charge nurse.    Labs/tests ordered:  none  Time spend 25 minutes.

## 2018-11-25 NOTE — Assessment & Plan Note (Signed)
Stable, continue Levothyroxine 122mcg qd, last TSH wnl 0.69 11/19/18

## 2018-11-25 NOTE — Assessment & Plan Note (Signed)
Continue [facility] for safety and care assistance, continue Donepezil 5mg  qd.

## 2019-03-17 DIAGNOSIS — Z1159 Encounter for screening for other viral diseases: Secondary | ICD-10-CM | POA: Diagnosis not present

## 2019-03-18 LAB — NOVEL CORONAVIRUS, NAA: SARS-CoV-2, NAA: NOT DETECTED

## 2019-04-08 ENCOUNTER — Non-Acute Institutional Stay: Payer: Medicare Other | Admitting: Nurse Practitioner

## 2019-04-08 ENCOUNTER — Encounter: Payer: Self-pay | Admitting: Nurse Practitioner

## 2019-04-08 DIAGNOSIS — E039 Hypothyroidism, unspecified: Secondary | ICD-10-CM

## 2019-04-08 DIAGNOSIS — F418 Other specified anxiety disorders: Secondary | ICD-10-CM

## 2019-04-08 DIAGNOSIS — F0390 Unspecified dementia without behavioral disturbance: Secondary | ICD-10-CM | POA: Diagnosis not present

## 2019-04-08 NOTE — Assessment & Plan Note (Addendum)
PHQ -9 10 indicating moderate depression(it was 2 prior). Will start Lexapro '5mg'$  qd, observe. Update CBC/diff, CMP/eGFR, TSH

## 2019-04-08 NOTE — Progress Notes (Signed)
Location:  [facility] Room Number: 094 Place of Service:  ALF (13) Provider: Lennie Odor Vivien Barretto NP  [name], MD  Patient Care Team: [name], MD as PCP - General (Internal Medicine) Zahria Ding X, NP as Nurse Practitioner (Internal Medicine)  Extended Emergency Contact Information Primary Emergency Contact: Minich,[name] Address: [address]          [place], [place] [address] [name] of [place] Phone: [phone] Mobile Phone: [phone] Relation: Son Secondary Emergency Contact: Arns,Brett Address: [address]          [place], [place] [address] [name] of [place] Phone: [phone] Work Phone: [phone] Mobile Phone: [phone] Relation: Son  Code Status: DNR Goals of care: Advanced Directive information Advanced Directives 04/08/2019  Does Patient Have a Medical Advance Directive? Yes  Type of Advance Directive Out of facility DNR (pink MOST or yellow form)  Does patient want to make changes to medical advance directive? No - Patient declined  Copy of [place] in Chart? -  Pre-existing out of facility DNR order (yellow form or pink MOST form) Yellow form placed in chart (order not valid for inpatient use);Pink MOST form placed in chart (order not valid for inpatient use)     Chief Complaint  Patient presents with  . Acute Visit    depression     HPI:  Pt is a 85 y.o. female seen today for an acute visit for depression/ anxiety, escalated since the patient in isolation for + COVID 19 test. PHQ -9 10 indicating moderate depression(it was 2 prior). HPI was provided with assistance of staff, she resides in [facility] for safety and care assistance. Hx of Hypothyroidism, TSH 0.69 11/19/18, on Levothyroxine 170mg. She takes Donepezil 546mqd for memory.    Past Medical History:  Diagnosis Date  . Eczema   . Hypothyroid   . Hypothyroidism   . Hypothyroidism   . Vitamin D deficiency    Past Surgical  History:  Procedure Laterality Date  . CHOLECYSTECTOMY N/A 06/24/2013   Procedure: LAPAROSCOPIC CHOLECYSTECTOMY WITH INTRAOPERATIVE CHOLANGIOGRAM;  Surgeon: ArRalene OkMD;  [place]: [facility] Service: General;  Laterality: N/A;  . TONSILLECTOMY      No Known Allergies  Allergies as of 04/08/2019   No Known Allergies     Medication List       Accurate as of April 08, 2019 12:56 PM. If you have any questions, ask your nurse or doctor.        aspirin EC 81 MG tablet Take 1 tablet (81 mg total) by mouth daily.   calcium-vitamin D 500-200 MG-UNIT tablet Commonly known as: [place] 500/200 D-3 Take 1 tablet by mouth 2 (two) times daily.   donepezil 5 MG tablet Commonly known as: ARICEPT Take 1 tablet (5 mg total) by mouth at bedtime.   levothyroxine 100 MCG tablet Commonly known as: SYNTHROID Take 100 mcg by mouth daily before breakfast.      ROS was provided with assistance of staff Review of Systems  Constitutional: Negative for activity change, appetite change, chills, diaphoresis, fatigue, fever and unexpected weight change.  HENT: Positive for hearing loss. Negative for congestion, sinus pressure, sinus pain and voice change.   Respiratory: Negative for cough, shortness of breath and wheezing.   Cardiovascular: Negative for chest pain, palpitations and leg swelling.  Gastrointestinal: Negative for abdominal distention, abdominal pain, constipation, diarrhea, nausea and vomiting.  Genitourinary: Negative for difficulty urinating, dysuria, frequency and  urgency.  Musculoskeletal: Positive for gait problem.  Skin: Negative for color change and pallor.  Neurological: Negative for dizziness, speech difficulty, weakness and headaches.       Dementia  Psychiatric/Behavioral: Positive for confusion. Negative for agitation, behavioral problems, hallucinations and sleep disturbance. The patient is nervous/anxious.        Depressive mood    Immunization History  Administered  Date(s) Administered  . Influenza Whole 06/15/2018  . Influenza,inj,Quad PF,6+ Mos 06/25/2013  . Pneumococcal Conjugate-13 03/11/2014  . Pneumococcal Polysaccharide-23 09/11/1986, 09/11/2001, 06/25/2013  . Zoster Recombinat (Shingrix) 09/12/2007   Pertinent  Health Maintenance Due  Topic Date Due  . INFLUENZA VACCINE  04/12/2019  . DEXA SCAN  Completed  . PNA vac Low Risk Adult  Completed   Fall Risk  10/02/2017  Falls in the past year? Yes  Number falls in past yr: 1  Injury with Fall? No   Functional Status Survey:    Vitals:   04/08/19 1104  BP: 134/80  Pulse: 68  Resp: (!) 22  Temp: 97.8 F (36.6 C)  SpO2: 95%  Weight: 152 lb 6.4 oz (69.1 kg)  Height: _0  (1.422 m)   Body mass index is 34.17 kg/m. Physical Exam Constitutional:      General: She is not in acute distress.    Appearance: Normal appearance. She is not ill-appearing, toxic-appearing or diaphoretic.     Comments: overweight  HENT:     Head: Normocephalic and atraumatic.     Nose: Nose normal.     Mouth/Throat:     Mouth: Mucous membranes are moist.  Eyes:     Extraocular Movements: Extraocular movements intact.     Conjunctiva/sclera: Conjunctivae normal.     Pupils: Pupils are equal, round, and reactive to light.  Neck:     Musculoskeletal: Normal range of motion and neck supple.  Cardiovascular:     Rate and Rhythm: Normal rate and regular rhythm.     Heart sounds: No murmur.  Pulmonary:     Effort: Pulmonary effort is normal.     Breath sounds: No wheezing or rales.  Abdominal:     General: Bowel sounds are normal. There is no distension.     Palpations: Abdomen is soft.     Tenderness: There is no abdominal tenderness. There is no right CVA tenderness, left CVA tenderness, guarding or rebound.  Musculoskeletal:     Right lower leg: No edema.     Left lower leg: No edema.     Comments: Ambulates with walker.   Skin:    General: Skin is warm and dry.  Neurological:     General: No  focal deficit present.     Mental Status: She is alert. Mental status is at baseline.     Cranial Nerves: No cranial nerve deficit.     Motor: No weakness.     Coordination: Coordination normal.     Gait: Gait abnormal.     Comments: Oriented to person and her room on unit  Psychiatric:        Behavior: Behavior normal.     Comments: Seems anxious, repetitive, escalated worries, overwhelmed.      Labs reviewed: Recent Labs    06/18/18 11/20/18  NA 133* 132*  K 4.2 4.3  CL 101 99  CO2 21 23  BUN 11 14  CREATININE 0.7 0.8  CALCIUM 9.0 9.0   Recent Labs    06/18/18 11/20/18  AST 15 19  ALT 9 12  ALKPHOS 62 59  PROT 6.2  --   ALBUMIN 3.7 3.7   Recent Labs    06/18/18 11/20/18  WBC 7.4 10.4  HGB 15.2 15.5  HCT 45 45  PLT 206 134*   Lab Results  Component Value Date   TSH 0.69 11/19/2018   Lab Results  Component Value Date   HGBA1C 5.7 06/18/2018   Lab Results  Component Value Date   CHOL 188 01/30/2018   HDL 64 01/30/2018   LDLCALC 106 (H) 01/30/2018   TRIG 89 01/30/2018   CHOLHDL 2.9 01/30/2018    Significant Diagnostic Results in last 30 days:  No results found.  Assessment/Plan: Situational anxiety  PHQ -9 10 indicating moderate depression(it was 2 prior). Will start Lexapro 64m qd, observe. Update CBC/diff, CMP/eGFR, TSH  Hypothyroidism Continue Levothyroxine 1071m qd, last TSH 0.69 11/19/18, update TSH ,CBC/diff, CMP/eGFR  Dementia without behavioral disturbance ([facility] and [facility], continue Donepezil 92m51md for memory.     Family/ staff Communication: plan of care reviewed with the patient and charge nurse.   Labs/tests ordered:  CBC/diff, CMP/eGFR, TSH   Time spend 40 minutes.

## 2019-04-08 NOTE — Assessment & Plan Note (Signed)
Continue Levothyroxine 155mg qd, last TSH 0.69 11/19/18, update TSH ,CBC/diff, CMP/eGFR

## 2019-04-08 NOTE — Assessment & Plan Note (Signed)
Continue AL [facility] for safety and care assistance, continue Donepezil 5mg  qd for memory.

## 2019-04-11 ENCOUNTER — Other Ambulatory Visit: Payer: Self-pay

## 2019-04-15 DIAGNOSIS — Z79899 Other long term (current) drug therapy: Secondary | ICD-10-CM | POA: Diagnosis not present

## 2019-04-15 DIAGNOSIS — E039 Hypothyroidism, unspecified: Secondary | ICD-10-CM | POA: Diagnosis not present

## 2019-04-15 DIAGNOSIS — I10 Essential (primary) hypertension: Secondary | ICD-10-CM | POA: Diagnosis not present

## 2019-04-15 DIAGNOSIS — F418 Other specified anxiety disorders: Secondary | ICD-10-CM | POA: Diagnosis not present

## 2019-04-15 LAB — BASIC METABOLIC PANEL
BUN: 13 (ref 4–21)
Creatinine: 0.7 (ref 0.5–1.1)
Glucose: 107
Potassium: 4.3 (ref 3.4–5.3)
Sodium: 134 — AB (ref 137–147)

## 2019-04-15 LAB — HEPATIC FUNCTION PANEL
ALT: 11 (ref 7–35)
AST: 14 (ref 13–35)
Alkaline Phosphatase: 55 (ref 25–125)
Bilirubin, Total: 1

## 2019-04-15 LAB — CBC AND DIFFERENTIAL
HCT: 43 (ref 36–46)
Hemoglobin: 14.7 (ref 12.0–16.0)
Platelets: 190 (ref 150–399)
WBC: 7.7

## 2019-04-15 LAB — TSH: TSH: 2.13 (ref 0.41–5.90)

## 2019-04-16 DIAGNOSIS — Z1159 Encounter for screening for other viral diseases: Secondary | ICD-10-CM | POA: Diagnosis not present

## 2019-04-17 DIAGNOSIS — E039 Hypothyroidism, unspecified: Secondary | ICD-10-CM | POA: Diagnosis not present

## 2019-04-21 ENCOUNTER — Non-Acute Institutional Stay: Payer: Medicare Other | Admitting: Internal Medicine

## 2019-04-21 ENCOUNTER — Encounter: Payer: Self-pay | Admitting: Internal Medicine

## 2019-04-21 ENCOUNTER — Other Ambulatory Visit: Payer: Self-pay | Admitting: *Deleted

## 2019-04-21 DIAGNOSIS — F332 Major depressive disorder, recurrent severe without psychotic features: Secondary | ICD-10-CM | POA: Diagnosis not present

## 2019-04-21 DIAGNOSIS — E871 Hypo-osmolality and hyponatremia: Secondary | ICD-10-CM

## 2019-04-21 DIAGNOSIS — E039 Hypothyroidism, unspecified: Secondary | ICD-10-CM | POA: Diagnosis not present

## 2019-04-21 DIAGNOSIS — F0390 Unspecified dementia without behavioral disturbance: Secondary | ICD-10-CM

## 2019-04-21 LAB — CHLORIDE
Albumin: 3.4
Calcium: 8.8
Carbon Dioxide, Total: 25
Chloride: 102
EGFR (Non-African Amer.): 78
Globulin: 2.3
Total Protein: 5.7 g/dL

## 2019-04-21 NOTE — Progress Notes (Signed)
Location:  [facility] Room Number: [place] of Service:  ALF [phone]) Provider:  Veleta Miners  MD   [name], MD  Patient Care Team: [name], MD as PCP - General (Internal Medicine) Mast, Man X, NP as Nurse Practitioner (Internal Medicine)  Extended Emergency Contact Information Primary Emergency Contact: Hendrix,[name] Address: [address]          [place], [place] [address] [name] of [place] Phone: [phone] Mobile Phone: [phone] Relation: Son Secondary Emergency Contact: Koppel,Brett Address: [address]          [place], [place] [address] [name] of [place] Phone: [phone] Work Phone: [phone] Mobile Phone: [phone] Relation: Son  Code Status:  DNR Goals of care: Advanced Directive information Advanced Directives 04/21/2019  Does Patient Have a Medical Advance Directive? Yes  Type of Advance Directive Out of facility DNR (pink MOST or yellow form)  Does patient want to make changes to medical advance directive? No - Patient declined  Copy of [place] in Chart? -  Pre-existing out of facility DNR order (yellow form or pink MOST form) Yellow form placed in chart (order not valid for inpatient use);Pink MOST form placed in chart (order not valid for inpatient use)     Chief Complaint  Patient presents with  . Medical Management of Chronic Issues    HPI:  Pt is a 85 y.o. female seen today for medical management of chronic diseases.    Patient has h/o hypothyroidism, dementia,, sinus bradycardia, prediabetes, hyponatremia and recently got diagnosed with depression Patient lives with COVID positive on screening in nursing facility She was completely asymptomatic she stayed in quarantine for 2 weeks But since then patient has become very depressed very upset. Today she kept complaining that she wants to go back to her apartment.  She has been in AL for now more than a  year.  She said she does not understand why she has to stay in her room. She said she wants to die though she did not have any plans for suicide. She was recently started on Celexa Her weight is stable.  She is eating well and sleeping well at night No new nursing issues   Past Medical History:  Diagnosis Date  . Eczema   . Hypothyroid   . Hypothyroidism   . Hypothyroidism   . Vitamin D deficiency    Past Surgical History:  Procedure Laterality Date  . CHOLECYSTECTOMY N/A 06/24/2013   Procedure: LAPAROSCOPIC CHOLECYSTECTOMY WITH INTRAOPERATIVE CHOLANGIOGRAM;  Surgeon: Ralene Ok, MD;  Location: [facility];  Service: General;  Laterality: N/A;  . TONSILLECTOMY      No Known Allergies  Outpatient Encounter Medications as of 04/21/2019  Medication Sig  . aspirin EC 81 MG tablet Take 1 tablet (81 mg total) by mouth daily.  . calcium-vitamin D (OSCAL 500/200 D-3) 500-200 MG-UNIT tablet Take 1 tablet by mouth 2 (two) times daily.  Marland Kitchen donepezil (ARICEPT) 5 MG tablet Take 1 tablet (5 mg total) by mouth at bedtime.  Marland Kitchen escitalopram (LEXAPRO) 5 MG tablet Take 5 mg by mouth daily.  Marland Kitchen levothyroxine (SYNTHROID, LEVOTHROID) 100 MCG tablet Take 100 mcg by mouth daily before breakfast.   No facility-administered encounter medications on file as of 04/21/2019.     Review of Systems  Review of Systems  Constitutional: Negative for activity change, appetite change, chills, diaphoresis, fatigue and fever.  HENT: Negative for mouth sores, postnasal drip, rhinorrhea, sinus  pain and sore throat.   Respiratory: Negative for apnea, cough, chest tightness, shortness of breath and wheezing.   Cardiovascular: Negative for chest pain, palpitations and leg swelling.  Gastrointestinal: Negative for abdominal distention, abdominal pain, constipation, diarrhea, nausea and vomiting.  Genitourinary: Negative for dysuria and frequency.  Musculoskeletal: Negative for arthralgias, joint swelling and myalgias.   Skin: Negative for rash.  Neurological: Negative for dizziness, syncope, weakness, light-headedness and numbness.  Psychiatric/Behavioral: Negative for behavioral problems, confusion and sleep disturbance.     Immunization History  Administered Date(s) Administered  . Influenza Whole 06/15/2018  . Influenza,inj,Quad PF,6+ Mos 06/25/2013  . Pneumococcal Conjugate-13 03/11/2014  . Pneumococcal Polysaccharide-23 09/11/1986, 09/11/2001, 06/25/2013  . Zoster Recombinat (Shingrix) 09/12/2007   Pertinent  Health Maintenance Due  Topic Date Due  . INFLUENZA VACCINE  04/12/2019  . DEXA SCAN  Completed  . PNA vac Low Risk Adult  Completed   Fall Risk  10/02/2017  Falls in the past year? Yes  Number falls in past yr: 1  Injury with Fall? No   Functional Status Survey:    Vitals:   04/21/19 0847  BP: 134/80  Pulse: 78  Resp: 18  Temp: 98.3 F (36.8 C)  SpO2: 92%  Weight: 154 lb 9.6 oz (70.1 kg)  Height: 4\' 8"  (1.422 m)   Body mass index is 34.66 kg/m. Physical Exam  Constitutional: Oriented to person, place, and time. Well-developed and well-nourished.  HENT:  Head: Normocephalic.  Mouth/Throat: Oropharynx is clear and moist.  Eyes: Pupils are equal, round, and reactive to light.  Neck: Neck supple.  Cardiovascular: Normal rate and normal heart sounds.  No murmur heard. Pulmonary/Chest: Effort normal and breath sounds normal. No respiratory distress. No wheezes. She has no rales.  Abdominal: Soft. Bowel sounds are normal. No distension. There is no tenderness. There is no rebound.  Musculoskeletal: No edema.  Lymphadenopathy: none Neurological: Walks with a walker.  No falls recently.  Skin: Skin is warm and dry.  Psychiatric: Very anxious.  She keeps repeating herself   Labs reviewed: Recent Labs    06/18/18 11/20/18 04/15/19 04/16/19  NA 133* 132* 134*  --   K 4.2 4.3 4.3  --   CL 101 99  --  102  CO2 21 23  --  25  BUN 11 14 13   --   CREATININE 0.7 0.8 0.7   --   CALCIUM 9.0 9.0  --  8.8   Recent Labs    06/18/18 11/20/18 04/15/19 04/16/19  AST 15 19 14   --   ALT 9 12 11   --   ALKPHOS 62 59 55  --   PROT 6.2  --   --  5.7  ALBUMIN 3.7 3.7  --  3.4   Recent Labs    06/18/18 11/20/18 04/15/19  WBC 7.4 10.4 7.7  HGB 15.2 15.5 14.7  HCT 45 45 43  PLT 206 134* 190   Lab Results  Component Value Date   TSH 2.13 04/15/2019   Lab Results  Component Value Date   HGBA1C 5.7 06/18/2018   Lab Results  Component Value Date   CHOL 188 01/30/2018   HDL 64 01/30/2018   LDLCALC 106 (H) 01/30/2018   TRIG 89 01/30/2018   CHOLHDL 2.9 01/30/2018    Significant Diagnostic Results in last 30 days:  No results found.  Assessment/Plan Acquired hypothyroidism - Plan:  TSH Normal  Continue same dose  Dementia Plan:  On Low doe of Aricept Check MMSE  Consider increasing the dose and adding Namenda  Hyponatremia - Plan:  Sodium 134 Will need repeat BMP on SSRI  Major Depression Worsened with quarantine Her increase the dose of Celexa to 10 mg Discussed with the nurses they are going to continue to monitor her little more closely Try to restart her activities with her Psychology consult    Family/ staff Communication:   Labs/tests ordered:    Total time spent in this patient care encounter was  25_  minutes; greater than 50% of the visit spent counseling patient and staff, reviewing records , Labs and coordinating care for problems addressed at this encounter.

## 2019-04-23 DIAGNOSIS — F411 Generalized anxiety disorder: Secondary | ICD-10-CM | POA: Diagnosis not present

## 2019-05-12 ENCOUNTER — Non-Acute Institutional Stay: Payer: Medicare Other | Admitting: Nurse Practitioner

## 2019-05-12 ENCOUNTER — Encounter: Payer: Self-pay | Admitting: Nurse Practitioner

## 2019-05-12 DIAGNOSIS — E039 Hypothyroidism, unspecified: Secondary | ICD-10-CM | POA: Diagnosis not present

## 2019-05-12 DIAGNOSIS — F418 Other specified anxiety disorders: Secondary | ICD-10-CM

## 2019-05-12 DIAGNOSIS — F0390 Unspecified dementia without behavioral disturbance: Secondary | ICD-10-CM

## 2019-05-12 NOTE — Progress Notes (Signed)
Location:  [facility] Room Number: [place] of Service:  ALF [phone]) Provider:  Michalina Calbert, Lennie Odor  NP  [name], MD  Patient Care Team: [name], MD as PCP - General (Internal Medicine) Joaovictor Krone X, NP as Nurse Practitioner (Internal Medicine)  Extended Emergency Contact Information Primary Emergency Contact: Santo,[name] Address: [address]          [place], [place] [address] [name] of [place] Phone: [phone] Mobile Phone: [phone] Relation: Son Secondary Emergency Contact: Connery,Brett Address: [address]          [place], [place] [address] [name] of [place] Phone: [phone] Work Phone: [phone] Mobile Phone: [phone] Relation: Son  Code Status:  DNR Goals of care: Advanced Directive information Advanced Directives 04/21/2019  Does Patient Have a Medical Advance Directive? Yes  Type of Advance Directive Out of facility DNR (pink MOST or yellow form)  Does patient want to make changes to medical advance directive? No - Patient declined  Copy of [place] in Chart? -  Pre-existing out of facility DNR order (yellow form or pink MOST form) Yellow form placed in chart (order not valid for inpatient use);Pink MOST form placed in chart (order not valid for inpatient use)     Chief Complaint  Patient presents with  . Acute Visit    Re-eval for seroquel    HPI:  Pt is a 85 y.o. female seen today for an acute visit for Seroquel use, Hx of dementia, on Donepezil 5mg  qd for memory, anxiety/deprssion, on Escitalopram 10mg  qd, she exhibited chanting behaviors, usually in pm, difficulty redirecting the patient, Seroquel 12.5mg  qhs prn helped, used in 9/14 days. Hx of hypothyroidism, taking Levothyroxine 1101mcg qd, last TSH 2.13 04/15/19   Past Medical History:  Diagnosis Date  . Eczema   . Hypothyroid   . Hypothyroidism   . Hypothyroidism   . Vitamin D deficiency    Past  Surgical History:  Procedure Laterality Date  . CHOLECYSTECTOMY N/A 06/24/2013   Procedure: LAPAROSCOPIC CHOLECYSTECTOMY WITH INTRAOPERATIVE CHOLANGIOGRAM;  Surgeon: Ralene Ok, MD;  Location: [facility];  Service: General;  Laterality: N/A;  . TONSILLECTOMY      No Known Allergies  Outpatient Encounter Medications as of 05/12/2019  Medication Sig  . aspirin EC 81 MG tablet Take 1 tablet (81 mg total) by mouth daily.  . calcium-vitamin D (OSCAL 500/200 D-3) 500-200 MG-UNIT tablet Take 1 tablet by mouth 2 (two) times daily.  Marland Kitchen donepezil (ARICEPT) 5 MG tablet Take 1 tablet (5 mg total) by mouth at bedtime.  Marland Kitchen escitalopram (LEXAPRO) 5 MG tablet Take 10 mg by mouth daily. 2 tablet =10mg   . levothyroxine (SYNTHROID, LEVOTHROID) 100 MCG tablet Take 100 mcg by mouth daily before breakfast.  . QUEtiapine (SEROQUEL) 12.5 mg TABS tablet Take 12.5 mg by mouth at bedtime.   No facility-administered encounter medications on file as of 05/12/2019.    ROS was provided with assistance of staff.  Review of Systems  Constitutional: Negative for activity change, appetite change, chills, diaphoresis, fatigue, fever and unexpected weight change.  HENT: Positive for hearing loss. Negative for congestion and voice change.   Respiratory: Negative for cough, shortness of breath and wheezing.   Cardiovascular: Negative for chest pain, palpitations and leg swelling.  Gastrointestinal: Negative for abdominal distention, abdominal pain, constipation, diarrhea, nausea and vomiting.  Genitourinary: Negative for difficulty urinating, dysuria and urgency.  Musculoskeletal: Positive for gait problem.  Skin: Negative for  color change and pallor.  Neurological: Negative for dizziness, speech difficulty, weakness and headaches.       Dementia  Psychiatric/Behavioral: Positive for behavioral problems. Negative for agitation, hallucinations and sleep disturbance. The patient is nervous/anxious.        Reported chanting  episodes, mostly in pm.     Immunization History  Administered Date(s) Administered  . Influenza Whole 06/15/2018  . Influenza,inj,Quad PF,6+ Mos 06/25/2013  . Pneumococcal Conjugate-13 03/11/2014  . Pneumococcal Polysaccharide-23 09/11/1986, 09/11/2001, 06/25/2013  . Zoster Recombinat (Shingrix) 09/12/2007   Pertinent  Health Maintenance Due  Topic Date Due  . INFLUENZA VACCINE  04/12/2019  . DEXA SCAN  Completed  . PNA vac Low Risk Adult  Completed   Fall Risk  10/02/2017  Falls in the past year? Yes  Number falls in past yr: 1  Injury with Fall? No   Functional Status Survey:    Vitals:   05/12/19 1229  BP: 120/68  Pulse: 63  Resp: 18  Temp: 97.9 F (36.6 C)  SpO2: 96%  Weight: 154 lb 9.6 oz (70.1 kg)  Height: 4\' 8"  (1.422 m)   Body mass index is 34.66 kg/m. Physical Exam Constitutional:      General: She is not in acute distress.    Appearance: Normal appearance. She is obese. She is not ill-appearing, toxic-appearing or diaphoretic.  HENT:     Head: Normocephalic and atraumatic.     Nose: Nose normal.     Mouth/Throat:     Mouth: Mucous membranes are moist.  Eyes:     Extraocular Movements: Extraocular movements intact.     Conjunctiva/sclera: Conjunctivae normal.     Pupils: Pupils are equal, round, and reactive to light.  Neck:     Musculoskeletal: Normal range of motion and neck supple.  Cardiovascular:     Rate and Rhythm: Normal rate and regular rhythm.     Heart sounds: No murmur.  Pulmonary:     Effort: Pulmonary effort is normal.     Breath sounds: No wheezing, rhonchi or rales.  Abdominal:     Palpations: Abdomen is soft.     Tenderness: There is no abdominal tenderness. There is no right CVA tenderness, left CVA tenderness, guarding or rebound.  Musculoskeletal:     Right lower leg: No edema.     Left lower leg: No edema.     Comments: Ambulates with walker.   Skin:    General: Skin is warm and dry.  Neurological:     General: No  focal deficit present.     Mental Status: She is alert. Mental status is at baseline.     Motor: No weakness.     Coordination: Coordination normal.     Gait: Gait abnormal.     Comments: Oriented to person, place.   Psychiatric:        Mood and Affect: Mood normal.        Behavior: Behavior normal.     Labs reviewed: Recent Labs    06/18/18 11/20/18 04/15/19 04/16/19  NA 133* 132* 134*  --   K 4.2 4.3 4.3  --   CL 101 99  --  102  CO2 21 23  --  25  BUN 11 14 13   --   CREATININE 0.7 0.8 0.7  --   CALCIUM 9.0 9.0  --  8.8   Recent Labs    06/18/18 11/20/18 04/15/19 04/16/19  AST 15 19 14   --   ALT 9 12 11   --  ALKPHOS 62 59 55  --   PROT 6.2  --   --  5.7  ALBUMIN 3.7 3.7  --  3.4   Recent Labs    06/18/18 11/20/18 04/15/19  WBC 7.4 10.4 7.7  HGB 15.2 15.5 14.7  HCT 45 45 43  PLT 206 134* 190   Lab Results  Component Value Date   TSH 2.13 04/15/2019   Lab Results  Component Value Date   HGBA1C 5.7 06/18/2018   Lab Results  Component Value Date   CHOL 188 01/30/2018   HDL 64 01/30/2018   LDLCALC 106 (H) 01/30/2018   TRIG 89 01/30/2018   CHOLHDL 2.9 01/30/2018    Significant Diagnostic Results in last 30 days:  No results found.  Assessment/Plan Dementia without behavioral disturbance ([place]) Not easily redirected chanting behaviors in pm per nurse, will schedule Seroquel 12.5mg  qhs, continue Donepezil 5mg  qd, observe.   Situational anxiety She sleeps, eats at her baseline, needs Seroquel for mostly pm chanting behaviors, continue Lexapro 10mg  qd.   Hypothyroidism Controlled, continue Levothyroxine 127mcg qd, last TSH wnl 2.13 04/15/19     Family/ staff Communication: plan of care reviewed with the patient and charge nurse.   Labs/tests ordered:  none  Time spend 40 minutes.

## 2019-05-12 NOTE — Assessment & Plan Note (Signed)
Controlled, continue Levothyroxine 138mcg qd, last TSH wnl 2.13 04/15/19

## 2019-05-12 NOTE — Assessment & Plan Note (Addendum)
She sleeps, eats at her baseline, needs Seroquel for mostly pm chanting behaviors, continue Lexapro 10mg  qd.

## 2019-05-12 NOTE — Assessment & Plan Note (Signed)
Not easily redirected chanting behaviors in pm per nurse, will schedule Seroquel 12.5mg  qhs, continue Donepezil 5mg  qd, observe.

## 2019-05-29 DIAGNOSIS — F418 Other specified anxiety disorders: Secondary | ICD-10-CM | POA: Diagnosis not present

## 2019-05-29 LAB — BASIC METABOLIC PANEL
BUN: 13 (ref 4–21)
Creatinine: 0.7 (ref 0.5–1.1)
Glucose: 134
Potassium: 4.1 (ref 3.4–5.3)
Sodium: 135 — AB (ref 137–147)

## 2019-06-10 ENCOUNTER — Non-Acute Institutional Stay: Payer: Medicare Other | Admitting: Internal Medicine

## 2019-06-10 ENCOUNTER — Encounter: Payer: Self-pay | Admitting: Internal Medicine

## 2019-06-10 DIAGNOSIS — E871 Hypo-osmolality and hyponatremia: Secondary | ICD-10-CM

## 2019-06-10 DIAGNOSIS — F0390 Unspecified dementia without behavioral disturbance: Secondary | ICD-10-CM

## 2019-06-10 DIAGNOSIS — E039 Hypothyroidism, unspecified: Secondary | ICD-10-CM | POA: Diagnosis not present

## 2019-06-10 DIAGNOSIS — F333 Major depressive disorder, recurrent, severe with psychotic symptoms: Secondary | ICD-10-CM | POA: Diagnosis not present

## 2019-06-10 LAB — CHLORIDE
Calcium: 8.8
Carbon Dioxide, Total: 25
Chloride: 102

## 2019-06-10 NOTE — Progress Notes (Signed)
[facility] Room Number: [id number] Place of Service:  ALF [phone]) Provider:  Veleta Miners MD  [name], MD  Patient Care Team: [name], MD as PCP - General (Internal Medicine) Mast, Man X, NP as Nurse Practitioner (Internal Medicine)  Extended Emergency Contact Information Primary Emergency Contact: Coffel,[name] Address: [address]          [place], [place] [address] [name] of [place] Phone: [phone] Mobile Phone: [phone] Relation: Son Secondary Emergency Contact: Rockhill,Brett Address: [address]          [place], [place] [address] [name] of [place] Phone: [phone] Work Phone: [phone] Mobile Phone: [phone] Relation: Son  Code Status:  DNR Goals of care: Advanced Directive information Advanced Directives 06/10/2019  Does Patient Have a Medical Advance Directive? Yes  Type of Advance Directive Out of facility DNR (pink MOST or yellow form)  Does patient want to make changes to medical advance directive? No - Patient declined  Copy of [place] in Chart? -  Pre-existing out of facility DNR order (yellow form or pink MOST form) Yellow form placed in chart (order not valid for inpatient use)     Chief Complaint  Patient presents with   Acute Visit    Behavior issues    HPI:  Pt is a 85 y.o. female seen today for an acute visit for Follow up of her Depression and Behavior Issues Patient has h/o hypothyroidism, dementia,, sinus bradycardia, prediabetes, hyponatremia and recently got diagnosed with depression Patient lives with COVID positive on screening in nursing facility Patient had to stay in quarantine for 2 weeks. Since then being quarantined patient became very depressed and upset.   She  told the nurses that she wants to die though she does not have any plan for suicide.    Current dose of Celexa was increased to 10 mg and she was started on low-dose of Seroquel last  month.  Patient since then has improved.  Today she seemed like she was in a very good mood. Nurses are Concerned that she sometimes makes this moaning sound when she is by herself.  They think that she is in some kind of the distress But when I entered the room she usually stops making that noise.  Was very pleasant very happy denied being depressed and suicidal.  Past Medical History:  Diagnosis Date   Eczema    Hypothyroid    Hypothyroidism    Hypothyroidism    Vitamin D deficiency    Past Surgical History:  Procedure Laterality Date   CHOLECYSTECTOMY N/A 06/24/2013   Procedure: LAPAROSCOPIC CHOLECYSTECTOMY WITH INTRAOPERATIVE CHOLANGIOGRAM;  Surgeon: Ralene Ok, MD;  Location: [facility];  Service: General;  Laterality: N/A;   TONSILLECTOMY      No Known Allergies  Allergies as of 06/10/2019   No Known Allergies     Medication List       Accurate as of June 10, 2019 10:11 AM. If you have any questions, ask your nurse or doctor.        aspirin EC 81 MG tablet Take 1 tablet (81 mg total) by mouth daily.   calcium-vitamin D 500-200 MG-UNIT tablet Commonly known as: Oscal 500/200 D-3 Take 1 tablet by mouth 2 (two) times daily.   donepezil 5 MG tablet Commonly known as: ARICEPT Take 1 tablet (5 mg total) by mouth at bedtime.   escitalopram 5 MG tablet Commonly known as: LEXAPRO Take 10  mg by mouth daily. 2 tablet =10mg    levothyroxine 100 MCG tablet Commonly known as: SYNTHROID Take 100 mcg by mouth daily before breakfast.   QUEtiapine 12.5 mg Tabs tablet Commonly known as: SEROQUEL Take 12.5 mg by mouth at bedtime.       Review of Systems  Review of Systems  Constitutional: Negative for activity change, appetite change, chills, diaphoresis, fatigue and fever.  HENT: Negative for mouth sores, postnasal drip, rhinorrhea, sinus pain and sore throat.   Respiratory: Negative for apnea, cough, chest tightness, shortness of breath and wheezing.     Cardiovascular: Negative for chest pain, palpitations and leg swelling.  Gastrointestinal: Negative for abdominal distention, abdominal pain, constipation, diarrhea, nausea and vomiting.  Genitourinary: Negative for dysuria and frequency.  Musculoskeletal: Negative for arthralgias, joint swelling and myalgias.  Skin: Negative for rash.  Neurological: Negative for dizziness, syncope, weakness, light-headedness and numbness.  Psychiatric/Behavioral: Negative for behavioral problems, confusion and sleep disturbance.     Immunization History  Administered Date(s) Administered   Influenza Whole 06/15/2018   Influenza,inj,Quad PF,6+ Mos 06/25/2013   Pneumococcal Conjugate-13 03/11/2014   Pneumococcal Polysaccharide-23 09/11/1986, 09/11/2001, 06/25/2013   Zoster Recombinat (Shingrix) 09/12/2007   Pertinent  Health Maintenance Due  Topic Date Due   INFLUENZA VACCINE  04/12/2019   DEXA SCAN  Completed   PNA vac Low Risk Adult  Completed   Fall Risk  10/02/2017  Falls in the past year? Yes  Number falls in past yr: 1  Injury with Fall? No   Functional Status Survey:    Vitals:   06/10/19 1000  BP: 126/68  Pulse: 86  Resp: 20  Temp: 98.3 F (36.8 C)  SpO2: 93%  Weight: 159 lb 6.4 oz (72.3 kg)   Body mass index is 35.74 kg/m. Physical Exam  Constitutional:  Well-developed and well-nourished.  HENT:  Head: Normocephalic.  Mouth/Throat: Oropharynx is clear and moist.  Eyes: Pupils are equal, round, and reactive to light.  Neck: Neck supple.  Cardiovascular: Normal rate and normal heart sounds.  No murmur heard. Pulmonary/Chest: Effort normal and breath sounds normal. No respiratory distress. No wheezes. She has no rales.  Abdominal: Soft. Bowel sounds are normal. No distension. There is no tenderness. There is no rebound.  Musculoskeletal: No edema.  Lymphadenopathy: none Neurological: No Focal Deficits Skin: Skin is warm and dry.  Psychiatric: Normal mood and  affect. Behavior is normal. Thought content normal.    Labs reviewed: Recent Labs    11/20/18 04/15/19 04/16/19 05/29/19  NA 132* 134*  --  135*  K 4.3 4.3  --  4.1  CL 99  --  102 102  CO2 23  --  25 25  BUN 14 13  --  13  CREATININE 0.8 0.7  --  0.7  CALCIUM 9.0  --  8.8 8.8   Recent Labs    06/18/18 11/20/18 04/15/19 04/16/19  AST 15 19 14   --   ALT 9 12 11   --   ALKPHOS 62 59 55  --   PROT 6.2  --   --  5.7  ALBUMIN 3.7 3.7  --  3.4   Recent Labs    06/18/18 11/20/18 04/15/19  WBC 7.4 10.4 7.7  HGB 15.2 15.5 14.7  HCT 45 45 43  PLT 206 134* 190   Lab Results  Component Value Date   TSH 2.13 04/15/2019   Lab Results  Component Value Date   HGBA1C 5.7 06/18/2018   Lab Results  Component Value Date   CHOL 188 01/30/2018   HDL 64 01/30/2018   LDLCALC 106 (H) 01/30/2018   TRIG 89 01/30/2018   CHOLHDL 2.9 01/30/2018    Significant Diagnostic Results in last 30 days:  No results found.  Assessment/Plan Major depression Worsened after quarantine Patient improved on high dose of Celexa. I talked to her daughter today who was concerned about the dosing of Celexa. I told her that at this time I would be more interested to take her off Seroquel.  She has agreed with the plan  will make her Seroquel as needed The daughter also said that her moaning sounds is  Not new .  Patient is hard of hearing and sometimes she talks to herself without realizing.  Other issues are stable Acquired hypothyroidism - Plan:  TSH Normal  Continue same dose  Dementia Plan:  On Low dose of Aricept   Hyponatremia - Plan:  Sodium 135 Stable  on SSRI     Family/ staff Communication:   Labs/tests ordered:   Total time spent in this patient care encounter was  25_  minutes; greater than 50% of the visit spent counseling patient and staff, reviewing records , Labs and coordinating care for problems addressed at this encounter.

## 2019-06-11 NOTE — Progress Notes (Signed)
A user error has taken place.

## 2019-06-14 ENCOUNTER — Encounter (HOSPITAL_COMMUNITY): Payer: Self-pay | Admitting: Emergency Medicine

## 2019-06-14 ENCOUNTER — Inpatient Hospital Stay (HOSPITAL_COMMUNITY)
Admission: EM | Admit: 2019-06-14 | Discharge: 2019-06-18 | DRG: 200 | Disposition: A | Discharge status: Skilled Nursing Facility | Payer: Medicare Other | Attending: General Surgery | Admitting: General Surgery

## 2019-06-14 ENCOUNTER — Other Ambulatory Visit: Payer: Self-pay

## 2019-06-14 ENCOUNTER — Emergency Department (HOSPITAL_COMMUNITY): Payer: Medicare Other

## 2019-06-14 ENCOUNTER — Other Ambulatory Visit (HOSPITAL_COMMUNITY): Payer: Self-pay

## 2019-06-14 DIAGNOSIS — J939 Pneumothorax, unspecified: Secondary | ICD-10-CM

## 2019-06-14 DIAGNOSIS — F0390 Unspecified dementia without behavioral disturbance: Secondary | ICD-10-CM | POA: Diagnosis present

## 2019-06-14 DIAGNOSIS — Y92129 Unspecified place in nursing home as the place of occurrence of the external cause: Secondary | ICD-10-CM

## 2019-06-14 DIAGNOSIS — Z20828 Contact with and (suspected) exposure to other viral communicable diseases: Secondary | ICD-10-CM | POA: Diagnosis present

## 2019-06-14 DIAGNOSIS — E559 Vitamin D deficiency, unspecified: Secondary | ICD-10-CM | POA: Diagnosis present

## 2019-06-14 DIAGNOSIS — Z9181 History of falling: Secondary | ICD-10-CM

## 2019-06-14 DIAGNOSIS — W19XXXA Unspecified fall, initial encounter: Secondary | ICD-10-CM | POA: Diagnosis not present

## 2019-06-14 DIAGNOSIS — Z7982 Long term (current) use of aspirin: Secondary | ICD-10-CM

## 2019-06-14 DIAGNOSIS — S271XXA Traumatic hemothorax, initial encounter: Secondary | ICD-10-CM | POA: Diagnosis not present

## 2019-06-14 DIAGNOSIS — Z79899 Other long term (current) drug therapy: Secondary | ICD-10-CM

## 2019-06-14 DIAGNOSIS — S270XXA Traumatic pneumothorax, initial encounter: Secondary | ICD-10-CM | POA: Diagnosis not present

## 2019-06-14 DIAGNOSIS — T797XXA Traumatic subcutaneous emphysema, initial encounter: Secondary | ICD-10-CM | POA: Diagnosis present

## 2019-06-14 DIAGNOSIS — R0902 Hypoxemia: Secondary | ICD-10-CM | POA: Diagnosis not present

## 2019-06-14 DIAGNOSIS — R7303 Prediabetes: Secondary | ICD-10-CM | POA: Diagnosis present

## 2019-06-14 DIAGNOSIS — S2242XA Multiple fractures of ribs, left side, initial encounter for closed fracture: Secondary | ICD-10-CM | POA: Diagnosis not present

## 2019-06-14 DIAGNOSIS — J942 Hemothorax: Secondary | ICD-10-CM

## 2019-06-14 DIAGNOSIS — E039 Hypothyroidism, unspecified: Secondary | ICD-10-CM | POA: Diagnosis not present

## 2019-06-14 DIAGNOSIS — Z79890 Hormone replacement therapy (postmenopausal): Secondary | ICD-10-CM

## 2019-06-14 DIAGNOSIS — R079 Chest pain, unspecified: Secondary | ICD-10-CM | POA: Diagnosis not present

## 2019-06-14 DIAGNOSIS — I4510 Unspecified right bundle-branch block: Secondary | ICD-10-CM | POA: Diagnosis present

## 2019-06-14 LAB — CBC WITH DIFFERENTIAL/PLATELET
Abs Immature Granulocytes: 0.04 10*3/uL (ref 0.00–0.07)
Basophils Absolute: 0 10*3/uL (ref 0.0–0.1)
Basophils Relative: 0 %
Eosinophils Absolute: 0 10*3/uL (ref 0.0–0.5)
Eosinophils Relative: 0 %
HCT: 41.4 % (ref 36.0–46.0)
Hemoglobin: 13.9 g/dL (ref 12.0–15.0)
Immature Granulocytes: 0 %
Lymphocytes Relative: 10 %
Lymphs Abs: 1.6 10*3/uL (ref 0.7–4.0)
MCH: 33.1 pg (ref 26.0–34.0)
MCHC: 33.6 g/dL (ref 30.0–36.0)
MCV: 98.6 fL (ref 80.0–100.0)
Monocytes Absolute: 2.1 10*3/uL — ABNORMAL HIGH (ref 0.1–1.0)
Monocytes Relative: 14 %
Neutro Abs: 11.3 10*3/uL — ABNORMAL HIGH (ref 1.7–7.7)
Neutrophils Relative %: 76 %
Platelets: 183 10*3/uL (ref 150–400)
RBC: 4.2 MIL/uL (ref 3.87–5.11)
RDW: 13 % (ref 11.5–15.5)
WBC: 15.1 10*3/uL — ABNORMAL HIGH (ref 4.0–10.5)
nRBC: 0 % (ref 0.0–0.2)

## 2019-06-14 LAB — BASIC METABOLIC PANEL
Anion gap: 8 (ref 5–15)
BUN: 16 mg/dL (ref 8–23)
CO2: 24 mmol/L (ref 22–32)
Calcium: 9.4 mg/dL (ref 8.9–10.3)
Chloride: 100 mmol/L (ref 98–111)
Creatinine, Ser: 0.55 mg/dL (ref 0.44–1.00)
GFR calc Af Amer: >60 mL/min (ref >60)
GFR calc non Af Amer: >60 mL/min (ref >60)
Glucose, Bld: 119 mg/dL — ABNORMAL HIGH (ref 70–99)
Potassium: 4.2 mmol/L (ref 3.5–5.1)
Sodium: 132 mmol/L — ABNORMAL LOW (ref 135–145)

## 2019-06-14 MED ORDER — ACETAMINOPHEN 500 MG PO TABS
1000.0000 mg | ORAL_TABLET | Freq: Once | ORAL | Status: AC
  Administered 2019-06-14: 1000 mg via ORAL
  Filled 2019-06-14: qty 2

## 2019-06-14 MED ORDER — OXYCODONE HCL 5 MG PO TABS
5.0000 mg | ORAL_TABLET | ORAL | Status: DC | PRN
  Administered 2019-06-15 – 2019-06-18 (×5): 5 mg via ORAL
  Filled 2019-06-14 (×5): qty 1

## 2019-06-14 MED ORDER — ESCITALOPRAM OXALATE 10 MG PO TABS
10.0000 mg | ORAL_TABLET | Freq: Every day | ORAL | Status: DC
  Administered 2019-06-15 – 2019-06-18 (×4): 10 mg via ORAL
  Filled 2019-06-14 (×4): qty 1

## 2019-06-14 MED ORDER — ONDANSETRON 4 MG PO TBDP: 4.0000 mg | ORAL_TABLET | Freq: Four times a day (QID) | ORAL | Status: DC | PRN

## 2019-06-14 MED ORDER — DONEPEZIL HCL 5 MG PO TABS
5.0000 mg | ORAL_TABLET | Freq: Every day | ORAL | Status: DC
  Administered 2019-06-15 – 2019-06-17 (×4): 5 mg via ORAL
  Filled 2019-06-14 (×4): qty 1

## 2019-06-14 MED ORDER — DIPHENHYDRAMINE HCL 25 MG PO CAPS
25.0000 mg | ORAL_CAPSULE | Freq: Four times a day (QID) | ORAL | Status: DC | PRN
  Administered 2019-06-15 – 2019-06-17 (×3): 25 mg via ORAL
  Filled 2019-06-14 (×3): qty 1

## 2019-06-14 MED ORDER — LEVOTHYROXINE SODIUM 100 MCG PO TABS
100.0000 ug | ORAL_TABLET | Freq: Every day | ORAL | Status: DC
  Administered 2019-06-15 – 2019-06-18 (×4): 100 ug via ORAL
  Filled 2019-06-14 (×5): qty 1

## 2019-06-14 MED ORDER — MORPHINE SULFATE (PF) 2 MG/ML IV SOLN
2.0000 mg | INTRAVENOUS | Status: DC | PRN
  Administered 2019-06-15 – 2019-06-17 (×4): 2 mg via INTRAVENOUS
  Filled 2019-06-14 (×5): qty 1

## 2019-06-14 MED ORDER — ACETAMINOPHEN 325 MG PO TABS
650.0000 mg | ORAL_TABLET | ORAL | Status: DC | PRN
  Administered 2019-06-15 – 2019-06-16 (×3): 650 mg via ORAL
  Filled 2019-06-14 (×3): qty 2

## 2019-06-14 MED ORDER — ONDANSETRON HCL 4 MG/2ML IJ SOLN: 4.0000 mg | Freq: Four times a day (QID) | INTRAMUSCULAR | Status: DC | PRN

## 2019-06-14 MED ORDER — DOCUSATE SODIUM 100 MG PO CAPS
100.0000 mg | ORAL_CAPSULE | Freq: Two times a day (BID) | ORAL | Status: DC
  Administered 2019-06-15 – 2019-06-18 (×8): 100 mg via ORAL
  Filled 2019-06-14 (×7): qty 1

## 2019-06-14 NOTE — ED Provider Notes (Signed)
[facility] DEPT Provider Note   CSN: [id number] Arrival date & time: 06/14/19  2013     History   Chief Complaint Chief Complaint  Patient presents with   Fall    HPI Taleeyah Bora is a 85 y.o. female.     Patient is a 85 year old female with a history of dementia, prediabetes, right bundle branch block and hypothyroidism presenting today from her skilled facility after a fall.  Patient had a fall at 3 AM this morning reported by staff.  She then was complaining of pain in her chest so she had an x-ray at the facility which came back with left-sided rib fractures and possible pneumothorax.  Patient does complain of pain in her left chest but denies any trouble breathing.  She also denies falling it is unclear how reliable the historian she is.  However she denies any pain in her arms or her legs.  She states the pain is only present in her chest when she moves or touches it.  No report of head injury and patient does not take anticoagulation.  The history is provided by the EMS personnel and the nursing home. The history is limited by the absence of a caregiver.  Fall This is a new problem. The current episode started 12 to 24 hours ago. The problem occurs constantly. The problem has not changed since onset.Associated symptoms include chest pain. Pertinent negatives include no shortness of breath. The symptoms are aggravated by bending and twisting. The symptoms are relieved by rest. She has tried nothing for the symptoms. The treatment provided no relief.    Past Medical History:  Diagnosis Date   Eczema    Hypothyroid    Hypothyroidism    Hypothyroidism    Vitamin D deficiency     Patient Active Problem List   Diagnosis Date Noted   Acute bronchitis 11/20/2018   Incontinent of urine 07/18/2018   Urinary frequency 07/18/2018   Hyponatremia 06/19/2018   Osteopenia 04/12/2018   Prediabetes 03/19/2018   Dementia without  behavioral disturbance ([place]) 03/19/2018   Bilateral hearing loss 03/19/2018   Sinus bradycardia by electrocardiogram 03/19/2018   Right bundle branch block (RBBB) determined by electrocardiography 03/19/2018   Situational anxiety 03/08/2018   Advanced care planning/counseling discussion 03/08/2018   Overweight (BMI 25.0-29.9) 12/18/2017   Vitamin D deficiency 05/22/2017   Right sided weakness 12/03/2014   Leukocytosis 06/24/2013   Hypothyroidism 06/24/2013    Past Surgical History:  Procedure Laterality Date   CHOLECYSTECTOMY N/A 06/24/2013   Procedure: LAPAROSCOPIC CHOLECYSTECTOMY WITH INTRAOPERATIVE CHOLANGIOGRAM;  Surgeon: Ralene Ok, MD;  Location: [facility];  Service: General;  Laterality: N/A;   TONSILLECTOMY       OB History   No obstetric history on file.      Home Medications    Prior to Admission medications   Medication Sig Start Date End Date Taking? Authorizing Provider  aspirin EC 81 MG tablet Take 1 tablet (81 mg total) by mouth daily. 05/22/17   Blanchie Serve, MD  calcium-vitamin D (OSCAL 500/200 D-3) 500-200 MG-UNIT tablet Take 1 tablet by mouth 2 (two) times daily. 12/18/17   Blanchie Serve, MD  donepezil (ARICEPT) 5 MG tablet Take 1 tablet (5 mg total) by mouth at bedtime. 06/26/17   Blanchie Serve, MD  escitalopram (LEXAPRO) 5 MG tablet Take 10 mg by mouth daily. 2 tablet ='10mg'$  04/21/19   [provider]  levothyroxine (SYNTHROID, LEVOTHROID) 100 MCG tablet Take 100 mcg by mouth daily before  breakfast.    [provider]  QUEtiapine (SEROQUEL) 12.5 mg TABS tablet Take 12.5 mg by mouth at bedtime. 05/10/19   [provider]    Family History Family History  Problem Relation Age of Onset   Multiple myeloma Mother    Prostate cancer Father    Heart disease Father     Social History Social History   Tobacco Use   Smoking status: Never Smoker   Smokeless tobacco: Never Used  Substance Use Topics   Alcohol  use: No    Alcohol/week: 0.0 standard drinks   Drug use: No     Allergies   Patient has no known allergies.   Review of Systems Review of Systems  Unable to perform ROS: Dementia  Respiratory: Negative for shortness of breath.   Cardiovascular: Positive for chest pain.     Physical Exam Updated Vital Signs BP (!) 151/67 (BP Location: Right Arm)    Pulse 84    Temp 98.6 F (37 C) (Oral)    Resp 18    Ht '5\' 2"'$  (1.575 m)    SpO2 95%    BMI 29.15 kg/m   Physical Exam Vitals signs and nursing note reviewed.  Constitutional:      General: She is not in acute distress.    Appearance: She is well-developed and normal weight.  HENT:     Head: Normocephalic and atraumatic.  Eyes:     Pupils: Pupils are equal, round, and reactive to light.  Cardiovascular:     Rate and Rhythm: Normal rate and regular rhythm.     Heart sounds: Normal heart sounds. No murmur. No friction rub.  Pulmonary:     Effort: Pulmonary effort is normal. No accessory muscle usage.     Breath sounds: Normal breath sounds. No wheezing or rales.  Chest:     Chest wall: Tenderness and crepitus present.    Abdominal:     General: Bowel sounds are normal. There is no distension.     Palpations: Abdomen is soft.     Tenderness: There is no abdominal tenderness. There is no guarding or rebound.  Musculoskeletal: Normal range of motion.        General: No tenderness.     Right shoulder: Normal.     Left shoulder: Normal.     Right hip: Normal.     Left hip: Normal.     Right knee: Normal.     Left knee: Normal.     Right lower leg: No edema.     Left lower leg: No edema.     Comments: No edema  Skin:    General: Skin is warm and dry.     Capillary Refill: Capillary refill takes less than 2 seconds.     Findings: No rash.  Neurological:     Mental Status: She is alert. Mental status is at baseline.     Cranial Nerves: No cranial nerve deficit.     Comments: Oriented to person.  Moving all extremities  without difficulty and sensation intact  Psychiatric:        Mood and Affect: Mood normal.        Behavior: Behavior normal.        Thought Content: Thought content normal.      ED Treatments / Results  Labs (all labs ordered are listed, but only abnormal results are displayed) Labs Reviewed - No data to display  EKG None  Radiology Dg Chest 2 View  Result Date:  06/14/2019 CLINICAL DATA:  Chest pain EXAM: CHEST - 2 VIEW COMPARISON:  None. FINDINGS: There is cardiomegaly. Mildly increased interstitial markings seen throughout both lungs. A small left pleural effusion is seen. There is a minimally displaced posterior to left 6 rib fracture. Overlying subcutaneous emphysema seen in the chest wall. IMPRESSION: Cardiomegaly and interstitial edema. Small left pleural effusion Minimally displaced posterior left 6 rib fracture. Subcutaneous emphysema overlying the left chest wall. Electronically Signed   By: Prudencio Pair M.D.   On: 06/14/2019 21:13   Ct Chest Wo Contrast  Result Date: 06/14/2019 CLINICAL DATA:  85 year old female with history of trauma from a fall this morning. EXAM: CT CHEST WITHOUT CONTRAST TECHNIQUE: Multidetector CT imaging of the chest was performed following the standard protocol without IV contrast. COMPARISON:  No priors. FINDINGS: Cardiovascular: Heart size is normal. There is no significant pericardial fluid, thickening or pericardial calcification. There is aortic atherosclerosis, as well as atherosclerosis of the great vessels of the mediastinum and the coronary arteries, including calcified atherosclerotic plaque in the left anterior descending and right coronary arteries. Severe calcifications of the mitral annulus. Dilatation of the pulmonic trunk (4 cm in diameter), concerning for associated pulmonary arterial hypertension. Mediastinum/Nodes: No pathologically enlarged mediastinal or hilar lymph nodes. Please note that accurate exclusion of hilar adenopathy is limited  on noncontrast CT scans. Esophagus is unremarkable in appearance. No axillary lymphadenopathy. Lungs/Pleura: Small left pneumothorax. Small volume of low-intermediate attenuation fluid lying dependently in the left hemithorax, compatible with a small effusion at is partially hemorrhagic. Some associated passive atelectasis in portions of the left lung, both in the basal segments of the left lower lobe and inferior segment of the lingula. Some dependent subsegmental atelectasis in the basal segments of the right lower lobe. Remaining portions of the right lung are otherwise clear. No right pleural effusion. Upper Abdomen: Aortic atherosclerosis. Musculoskeletal: Multiple left-sided rib fractures are noted, as follows. Posterior left third rib fracture and anterolateral left third rib fracture, both of which are mildly displaced. Posterior left fourth rib fracture, nondisplaced and anterolateral left fourth rib fracture mildly comminuted and displaced. Posterior left fifth rib fracture mildly displaced and lateral left fifth rib fracture mildly comminuted and displaced. Posterior left sixth rib fracture nondisplaced, and lateral left sixth rib fracture mildly comminuted and displaced. Extensive subcutaneous emphysema in the left chest wall tracking both caudally and cephalad. IMPRESSION: 1. Multiple segmental left-sided rib fractures with small left hemopneumothorax and extensive subcutaneous emphysema in the left chest wall. 2. Dilatation of the pulmonic trunk (4 cm in diameter), concerning for pulmonary arterial hypertension. 3. Aortic atherosclerosis, in addition to 2 vessel coronary artery disease. Critical Value/emergent results were called by telephone at the time of interpretation on 06/14/2019 at 10:24 pm to provider Blanchie Dessert MD, who verbally acknowledged these results. Aortic Atherosclerosis (ICD10-I70.0). Electronically Signed   By: Vinnie Langton M.D.   On: 06/14/2019 22:24     Procedures Procedures (including critical care time)  Medications Ordered in ED Medications  acetaminophen (TYLENOL) tablet 1,000 mg (has no administration in time range)     Initial Impression / Assessment and Plan / ED Course  I have reviewed the triage vital signs and the nursing notes.  Pertinent labs & imaging results that were available during my care of the patient were reviewed by me and considered in my medical decision making (see chart for details).        Elderly female presenting today after a fall approximately 18 hours ago at  her skilled facility.  Patient had an x-ray done in the nursing home that showed possible rib fractures and small pneumothorax.  She was sent here for further evaluation.  The initial chest x-ray was done at 3:00.  Patient does have crepitus and left-sided chest pain but she is not hypoxic and appears very comfortable.  She was given Tylenol will repeat chest x-ray so we have a 6-hour repeat.  Patient has no evidence of head trauma.  She has no abdominal pain and is moving all extremities without difficulty.  10:36 PM  CT shows multiple segmental left-sided rib fractures with small left hemo-pneumo and extensive subcutaneous emphysema.  Given extensive fractures and mild pneumothorax will consult surgery.  Patient's oxygen saturation between 91 and 92%.  Final Clinical Impressions(s) / ED Diagnoses   Final diagnoses:  Fall, initial encounter  Closed fracture of multiple ribs of left side, initial encounter  Hemothorax on left    ED Discharge Orders    None       Blanchie Dessert, MD 06/15/19 0003

## 2019-06-14 NOTE — H&P (Signed)
Activation and Reason: consult, fall from standing height  Primary Survey: airway intact, breath sounds present bilaterally, pulses intact  Lisa Barrera is an 85 y.o. female.  HPI: 85 yo female fell earlier this morning at SNF. She later complained of pain in her left shoulder and then left chest. She got an XR at the facility and was then transferred to the ER for rib fractures. Currently, she does not complain of pain anywhere. She does not feel short of breath. She has dementia at baseline that has been worsening.  Past Medical History:  Diagnosis Date  . Eczema   . Hypothyroid   . Hypothyroidism   . Hypothyroidism   . Vitamin D deficiency     Past Surgical History:  Procedure Laterality Date  . CHOLECYSTECTOMY N/A 06/24/2013   Procedure: LAPAROSCOPIC CHOLECYSTECTOMY WITH INTRAOPERATIVE CHOLANGIOGRAM;  Surgeon: Ralene Ok, MD;  Location: [facility];  Service: General;  Laterality: N/A;  . TONSILLECTOMY      Family History  Problem Relation Age of Onset  . Multiple myeloma Mother   . Prostate cancer Father   . Heart disease Father     Social History:  reports that she has never smoked. She has never used smokeless tobacco. She reports that she does not drink alcohol or use drugs.  Allergies: No Known Allergies  Medications: I have reviewed the patient's current medications.  Results for orders placed or performed during the hospital encounter of 06/14/19 (from the past 48 hour(s))  CBC with Differential/Platelet     Status: Abnormal   Collection Time: 06/14/19 10:25 PM  Result Value Ref Range   WBC 15.1 (H) 4.0 - 10.5 K/uL   RBC 4.20 3.87 - 5.11 MIL/uL   Hemoglobin 13.9 12.0 - 15.0 g/dL   HCT 41.4 36.0 - 46.0 %   MCV 98.6 80.0 - 100.0 fL   MCH 33.1 26.0 - 34.0 pg   MCHC 33.6 30.0 - 36.0 g/dL   RDW 13.0 11.5 - 15.5 %   Platelets 183 150 - 400 K/uL   nRBC 0.0 0.0 - 0.2 %   Neutrophils Relative % 76 %   Neutro Abs 11.3 (H) 1.7 - 7.7 K/uL   Lymphocytes  Relative 10 %   Lymphs Abs 1.6 0.7 - 4.0 K/uL   Monocytes Relative 14 %   Monocytes Absolute 2.1 (H) 0.1 - 1.0 K/uL   Eosinophils Relative 0 %   Eosinophils Absolute 0.0 0.0 - 0.5 K/uL   Basophils Relative 0 %   Basophils Absolute 0.0 0.0 - 0.1 K/uL   Immature Granulocytes 0 %   Abs Immature Granulocytes 0.04 0.00 - 0.07 K/uL    Comment: Performed at [facility], [place] [address] [place], [place] [address]  Basic metabolic panel     Status: Abnormal   Collection Time: 06/14/19 10:25 PM  Result Value Ref Range   Sodium 132 (L) 135 - 145 mmol/L   Potassium 4.2 3.5 - 5.1 mmol/L   Chloride 100 98 - 111 mmol/L   CO2 24 22 - 32 mmol/L   Glucose, Bld 119 (H) 70 - 99 mg/dL   BUN 16 8 - 23 mg/dL   Creatinine, Ser 0.55 0.44 - 1.00 mg/dL   Calcium 9.4 8.9 - 10.3 mg/dL   GFR calc non Af Amer >60 >60 mL/min   GFR calc Af Amer >60 >60 mL/min   Anion gap 8 5 - 15    Comment: Performed at [facility], [address] [place], [place] [address]    Dg Chest 2 View  Result Date: 06/14/2019 CLINICAL DATA:  Chest pain EXAM: CHEST - 2 VIEW COMPARISON:  None. FINDINGS: There is cardiomegaly. Mildly increased interstitial markings seen throughout both lungs. A small left pleural effusion is seen. There is a minimally displaced posterior to left 6 rib fracture. Overlying subcutaneous emphysema seen in the chest wall. IMPRESSION: Cardiomegaly and interstitial edema. Small left pleural effusion Minimally displaced posterior left 6 rib fracture. Subcutaneous emphysema overlying the left chest wall. Electronically Signed   By: Prudencio Pair M.D.   On: 06/14/2019 21:13   Ct Chest Wo Contrast  Result Date: 06/14/2019 CLINICAL DATA:  85 year old female with history of trauma from a fall this morning. EXAM: CT CHEST WITHOUT CONTRAST TECHNIQUE: Multidetector CT imaging of the chest was performed following the standard protocol without IV contrast. COMPARISON:  No priors.  FINDINGS: Cardiovascular: Heart size is normal. There is no significant pericardial fluid, thickening or pericardial calcification. There is aortic atherosclerosis, as well as atherosclerosis of the great vessels of the mediastinum and the coronary arteries, including calcified atherosclerotic plaque in the left anterior descending and right coronary arteries. Severe calcifications of the mitral annulus. Dilatation of the pulmonic trunk (4 cm in diameter), concerning for associated pulmonary arterial hypertension. Mediastinum/Nodes: No pathologically enlarged mediastinal or hilar lymph nodes. Please note that accurate exclusion of hilar adenopathy is limited on noncontrast CT scans. Esophagus is unremarkable in appearance. No axillary lymphadenopathy. Lungs/Pleura: Small left pneumothorax. Small volume of low-intermediate attenuation fluid lying dependently in the left hemithorax, compatible with a small effusion at is partially hemorrhagic. Some associated passive atelectasis in portions of the left lung, both in the basal segments of the left lower lobe and inferior segment of the lingula. Some dependent subsegmental atelectasis in the basal segments of the right lower lobe. Remaining portions of the right lung are otherwise clear. No right pleural effusion. Upper Abdomen: Aortic atherosclerosis. Musculoskeletal: Multiple left-sided rib fractures are noted, as follows. Posterior left third rib fracture and anterolateral left third rib fracture, both of which are mildly displaced. Posterior left fourth rib fracture, nondisplaced and anterolateral left fourth rib fracture mildly comminuted and displaced. Posterior left fifth rib fracture mildly displaced and lateral left fifth rib fracture mildly comminuted and displaced. Posterior left sixth rib fracture nondisplaced, and lateral left sixth rib fracture mildly comminuted and displaced. Extensive subcutaneous emphysema in the left chest wall tracking both caudally  and cephalad. IMPRESSION: 1. Multiple segmental left-sided rib fractures with small left hemopneumothorax and extensive subcutaneous emphysema in the left chest wall. 2. Dilatation of the pulmonic trunk (4 cm in diameter), concerning for pulmonary arterial hypertension. 3. Aortic atherosclerosis, in addition to 2 vessel coronary artery disease. Critical Value/emergent results were called by telephone at the time of interpretation on 06/14/2019 at 10:24 pm to provider Blanchie Dessert MD, who verbally acknowledged these results. Aortic Atherosclerosis (ICD10-I70.0). Electronically Signed   By: Vinnie Langton M.D.   On: 06/14/2019 22:24    Review of Systems  Unable to perform ROS: Dementia   Blood pressure 130/64, pulse 81, temperature 98.6 F (37 C), temperature source Oral, resp. rate 18, height _0  (1.575 m), SpO2 92 %. Physical Exam  Vitals reviewed. Constitutional: She appears well-developed and well-nourished.  HENT:  Head: Normocephalic and atraumatic.  Eyes: Pupils are equal, round, and reactive to light. Conjunctivae and EOM are normal.  Neck: Normal range of motion. Neck supple.  Cardiovascular: Normal rate and regular rhythm.  Respiratory: Effort normal and breath sounds normal. No respiratory distress. She exhibits no tenderness.  GI: Soft. Bowel sounds are normal. She exhibits no distension. There is no abdominal tenderness.  Musculoskeletal: Normal range of motion.  Neurological: She is alert. No cranial nerve deficit.  Skin: Skin is warm and dry.  Psychiatric: She has a normal mood and affect. Her behavior is normal.     Assessment/Plan: 85 yo female with dementia had a fall from standing height with multiple left sided rib fractures and small anterior pneumothorax and moderate subcutaneous emphysema. -observe on trauma team -repeat XR in am -clear liquids today -pulse ox -IS -pain control -PT/OT consults  Procedures: none  Arta Bruce Kinsinger 06/14/2019, 11:25  PM

## 2019-06-14 NOTE — ED Triage Notes (Signed)
Patient arrived with PTAR  From SNF( [facility]) c/o unwitnessed FALL this morning at 3 am. Patient had Xray in the facility this afternoon and showed lef upper and mid region rIb fracture and possible pneumothorax. No LOC reported. Patient complain of left chest pain.

## 2019-06-15 ENCOUNTER — Observation Stay (HOSPITAL_COMMUNITY): Payer: Medicare Other

## 2019-06-15 DIAGNOSIS — W19XXXA Unspecified fall, initial encounter: Secondary | ICD-10-CM | POA: Diagnosis not present

## 2019-06-15 DIAGNOSIS — Z79899 Other long term (current) drug therapy: Secondary | ICD-10-CM | POA: Diagnosis not present

## 2019-06-15 DIAGNOSIS — Z7401 Bed confinement status: Secondary | ICD-10-CM | POA: Diagnosis not present

## 2019-06-15 DIAGNOSIS — Z20828 Contact with and (suspected) exposure to other viral communicable diseases: Secondary | ICD-10-CM | POA: Diagnosis present

## 2019-06-15 DIAGNOSIS — J939 Pneumothorax, unspecified: Secondary | ICD-10-CM | POA: Diagnosis not present

## 2019-06-15 DIAGNOSIS — R410 Disorientation, unspecified: Secondary | ICD-10-CM | POA: Diagnosis not present

## 2019-06-15 DIAGNOSIS — F0390 Unspecified dementia without behavioral disturbance: Secondary | ICD-10-CM | POA: Diagnosis present

## 2019-06-15 DIAGNOSIS — J942 Hemothorax: Secondary | ICD-10-CM | POA: Diagnosis not present

## 2019-06-15 DIAGNOSIS — Y92129 Unspecified place in nursing home as the place of occurrence of the external cause: Secondary | ICD-10-CM | POA: Diagnosis not present

## 2019-06-15 DIAGNOSIS — S2242XA Multiple fractures of ribs, left side, initial encounter for closed fracture: Secondary | ICD-10-CM | POA: Diagnosis not present

## 2019-06-15 DIAGNOSIS — E039 Hypothyroidism, unspecified: Secondary | ICD-10-CM | POA: Diagnosis present

## 2019-06-15 DIAGNOSIS — I4510 Unspecified right bundle-branch block: Secondary | ICD-10-CM | POA: Diagnosis present

## 2019-06-15 DIAGNOSIS — Z7982 Long term (current) use of aspirin: Secondary | ICD-10-CM | POA: Diagnosis not present

## 2019-06-15 DIAGNOSIS — T797XXA Traumatic subcutaneous emphysema, initial encounter: Secondary | ICD-10-CM | POA: Diagnosis present

## 2019-06-15 DIAGNOSIS — Z79890 Hormone replacement therapy (postmenopausal): Secondary | ICD-10-CM | POA: Diagnosis not present

## 2019-06-15 DIAGNOSIS — Z9181 History of falling: Secondary | ICD-10-CM | POA: Diagnosis not present

## 2019-06-15 DIAGNOSIS — S271XXA Traumatic hemothorax, initial encounter: Secondary | ICD-10-CM | POA: Diagnosis present

## 2019-06-15 DIAGNOSIS — M2550 Pain in unspecified joint: Secondary | ICD-10-CM | POA: Diagnosis not present

## 2019-06-15 DIAGNOSIS — S270XXA Traumatic pneumothorax, initial encounter: Secondary | ICD-10-CM | POA: Diagnosis not present

## 2019-06-15 DIAGNOSIS — R0902 Hypoxemia: Secondary | ICD-10-CM | POA: Diagnosis not present

## 2019-06-15 DIAGNOSIS — R7303 Prediabetes: Secondary | ICD-10-CM | POA: Diagnosis present

## 2019-06-15 DIAGNOSIS — E559 Vitamin D deficiency, unspecified: Secondary | ICD-10-CM | POA: Diagnosis present

## 2019-06-15 LAB — MRSA PCR SCREENING: MRSA by PCR: NEGATIVE

## 2019-06-15 LAB — SARS CORONAVIRUS 2 BY RT PCR (HOSPITAL ORDER, PERFORMED IN ~~LOC~~ HOSPITAL LAB): SARS Coronavirus 2: NEGATIVE

## 2019-06-15 MED ORDER — LORAZEPAM 2 MG/ML IJ SOLN
1.0000 mg | Freq: Four times a day (QID) | INTRAMUSCULAR | Status: DC | PRN
  Administered 2019-06-15: 05:00:00 1 mg via INTRAVENOUS

## 2019-06-15 MED ORDER — LORAZEPAM 2 MG/ML IJ SOLN
INTRAMUSCULAR | Status: AC
  Administered 2019-06-15: 1 mg via INTRAVENOUS
  Filled 2019-06-15: qty 1

## 2019-06-15 NOTE — Evaluation (Signed)
Physical Therapy Evaluation Patient Details Name: Lisa Barrera MRN: [id number] DOB: 1926/05/21 Today's Date: 06/15/2019   History of Present Illness  85 yo female with dementia had a fall from standing height with multiple left sided rib fractures and small anterior pneumothorax and moderate subcutaneous emphysema  Clinical Impression  Orders received for PT evaluation. Patient demonstrates deficits in functional mobility as indicated below. Will benefit from continued skilled PT to address deficits and maximize function. Will see as indicated and progress as tolerated.  During mobility on room air, desaturation to mid 80s with noted increased WOB, ncreased O2 to 2 liters at rest. Rebounded to 93 %.     Follow Up Recommendations SNF;Supervision/Assistance - 24 hour    Equipment Recommendations  (ttbd)    Recommendations for Other Services       Precautions / Restrictions Precautions Precautions: Fall Restrictions Weight Bearing Restrictions: No      Mobility  Bed Mobility Overal bed mobility: Needs Assistance Bed Mobility: Supine to Sit     Supine to sit: Mod assist     General bed mobility comments: moderate assist to elevate trunk due to pain on left flank, assist to rotate hips to EOB  Transfers Overall transfer level: Needs assistance Equipment used: Rolling walker (2 wheeled) Transfers: Sit to/from Stand Sit to Stand: Min assist         General transfer comment: min assist to power up from bed and toilet, min assist for stability  Ambulation/Gait Ambulation/Gait assistance: Min assist Gait Distance (Feet): 22 Feet Assistive device: Rolling walker (2 wheeled) Gait Pattern/deviations: Step-to pattern;Decreased stride length;Trunk flexed;Wide base of support Gait velocity: decreased Gait velocity interpretation: <1.8 ft/sec, indicate of risk for recurrent falls General Gait Details: patient with some noted instability, assist for RW positioning and hands  on assist to maintain balance. Some evidence of SOB with ambulation  Stairs            Wheelchair Mobility    Modified Rankin (Stroke Patients Only)       Balance Overall balance assessment: History of Falls                                           Pertinent Vitals/Pain Pain Assessment: Faces Faces Pain Scale: Hurts even more Pain Location: left flank and chest Pain Descriptors / Indicators: Sharp;Discomfort;Grimacing;Guarding Pain Intervention(s): Limited activity within patient's tolerance;Monitored during session    Home Living Family/patient expects to be discharged to:: Assisted living(friends home)               Home Equipment: Walker - 4 wheels      Prior Function Level of Independence: Independent               Hand Dominance   Dominant Hand: Right    Extremity/Trunk Assessment   Upper Extremity Assessment Upper Extremity Assessment: RUE deficits/detail RUE: Unable to fully assess due to pain    Lower Extremity Assessment Lower Extremity Assessment: Generalized weakness       Communication   Communication: HOH  Cognition Arousal/Alertness: Awake/alert Behavior During Therapy: WFL for tasks assessed/performed Overall Cognitive Status: History of cognitive impairments - at baseline                                        General Comments  Exercises     Assessment/Plan    PT Assessment Patient needs continued PT services  PT Problem List Decreased strength;Decreased balance;Decreased mobility;Decreased activity tolerance;Pain;Cardiopulmonary status limiting activity       PT Treatment Interventions DME instruction;Gait training;Functional mobility training;Therapeutic activities;Therapeutic exercise;Patient/family education;Neuromuscular re-education;Balance training    PT Goals (Current goals can be found in the Care Plan section)  Acute Rehab PT Goals Patient Stated Goal: to go  home PT Goal Formulation: With patient/family Time For Goal Achievement: 06/29/19 Potential to Achieve Goals: Good    Frequency Min 3X/week   Barriers to discharge        Co-evaluation               AM-PAC PT "6 Clicks" Mobility  Outcome Measure Help needed turning from your back to your side while in a flat bed without using bedrails?: A Little Help needed moving from lying on your back to sitting on the side of a flat bed without using bedrails?: A Little Help needed moving to and from a bed to a chair (including a wheelchair)?: A Little Help needed standing up from a chair using your arms (e.g., wheelchair or bedside chair)?: A Lot Help needed to walk in hospital room?: A Lot Help needed climbing 3-5 steps with a railing? : A Lot 6 Click Score: 15    End of Session Equipment Utilized During Treatment: Oxygen Activity Tolerance: Patient limited by pain;Patient limited by fatigue Patient left: in chair;with family/visitor present Nurse Communication: Mobility status(need for chair alarm, none located on floor) PT Visit Diagnosis: Other abnormalities of gait and mobility (R26.89);History of falling (Z91.81);Pain    Time: 1001-1036 PT Time Calculation (min) (ACUTE ONLY): 35 min   Charges:   PT Evaluation $PT Eval Moderate Complexity: 1 Mod PT Treatments $Therapeutic Activity: 8-22 mins        Alben Deeds, PT DPT  Board Certified Neurologic Specialist Acute Rehabilitation Services Pager [phone] Office [phone]   Duncan Dull 06/15/2019, 11:40 AM

## 2019-06-15 NOTE — Progress Notes (Signed)
Per Dr. Hassell Done it is ok for pt's two sons Wilfred Lacy and Jeani Hawking) to switch out to care for pt due to her dementia.

## 2019-06-15 NOTE — Plan of Care (Signed)
  Problem: Education: Goal: Knowledge of General Education information will improve Description: Including pain rating scale, medication(s)/side effects and non-pharmacologic comfort measures Outcome: Progressing   Problem: Pain Managment: Goal: General experience of comfort will improve Outcome: Progressing   Problem: Health Behavior/Discharge Planning: Goal: Ability to manage health-related needs will improve Outcome: Progressing   Problem: Safety: Goal: Ability to remain free from injury will improve Outcome: Progressing   Problem: Skin Integrity: Goal: Risk for impaired skin integrity will decrease Outcome: Progressing

## 2019-06-15 NOTE — ED Notes (Signed)
ED TO INPATIENT HANDOFF REPORT  Name/Age/Gender Lisa Barrera 85 y.o. female  Code Status    Code Status Orders  (From admission, onward)         Start     Ordered   06/14/19 2321  Full code  Continuous     06/14/19 2322        Code Status History    Date Active Date Inactive Code Status Order ID Comments User Context   06/24/2013 1748 06/25/2013 1339 Full Code [id number]  Ralene Ok, MD Inpatient   06/24/2013 1204 06/24/2013 1748 Full Code [id number]  Erby Pian, NP ED   Advance Care Planning Activity      Home/SNF/Other Skilled nursing facility  Chief Complaint Fall with discomfort to left side of chest  Level of Care/Admitting Diagnosis ED Disposition    ED Disposition Condition [place]: [facility] [[id number]]  Level of Care: Med-Surg [16]  Covid Evaluation: Asymptomatic Screening Protocol (No Symptoms)  Diagnosis: Pneumothorax [id number]  Admitting Physician: TRAUMA MD [[id number]]  Attending Physician: TRAUMA MD [[id number]]  PT Class (Do Not Modify): Observation [104]  PT Acc Code (Do Not Modify): Observation [10022]       Medical History Past Medical History:  Diagnosis Date  . Eczema   . Hypothyroid   . Hypothyroidism   . Hypothyroidism   . Vitamin D deficiency     Allergies No Known Allergies  IV Location/Drains/Wounds Patient Lines/Drains/Airways Status   Active Line/Drains/Airways    Name:   Placement date:   Placement time:   Site:   Days:   Peripheral IV 06/14/19 Right Forearm   06/14/19    2233    Forearm   1   Incision 06/24/13 Abdomen Other (Comment)   06/24/13    1616     2182   Incision - 4 Ports Abdomen Umbilicus Left;Upper Right;Upper Right;Lower   06/24/13    -     2182          Labs/Imaging Results for orders placed or performed during the hospital encounter of 06/14/19 (from the past 48 hour(s))  CBC with Differential/Platelet     Status: Abnormal   Collection Time: 06/14/19 10:25  PM  Result Value Ref Range   WBC 15.1 (H) 4.0 - 10.5 K/uL   RBC 4.20 3.87 - 5.11 MIL/uL   Hemoglobin 13.9 12.0 - 15.0 g/dL   HCT 41.4 36.0 - 46.0 %   MCV 98.6 80.0 - 100.0 fL   MCH 33.1 26.0 - 34.0 pg   MCHC 33.6 30.0 - 36.0 g/dL   RDW 13.0 11.5 - 15.5 %   Platelets 183 150 - 400 K/uL   nRBC 0.0 0.0 - 0.2 %   Neutrophils Relative % 76 %   Neutro Abs 11.3 (H) 1.7 - 7.7 K/uL   Lymphocytes Relative 10 %   Lymphs Abs 1.6 0.7 - 4.0 K/uL   Monocytes Relative 14 %   Monocytes Absolute 2.1 (H) 0.1 - 1.0 K/uL   Eosinophils Relative 0 %   Eosinophils Absolute 0.0 0.0 - 0.5 K/uL   Basophils Relative 0 %   Basophils Absolute 0.0 0.0 - 0.1 K/uL   Immature Granulocytes 0 %   Abs Immature Granulocytes 0.04 0.00 - 0.07 K/uL    Comment: Performed at [facility], [place] [address], [place], [place] [id number]  Basic metabolic panel     Status: Abnormal   Collection Time: 06/14/19 10:25 PM  Result Value  Ref Range   Sodium 132 (L) 135 - 145 mmol/L   Potassium 4.2 3.5 - 5.1 mmol/L   Chloride 100 98 - 111 mmol/L   CO2 24 22 - 32 mmol/L   Glucose, Bld 119 (H) 70 - 99 mg/dL   BUN 16 8 - 23 mg/dL   Creatinine, Ser 0.55 0.44 - 1.00 mg/dL   Calcium 9.4 8.9 - 10.3 mg/dL   GFR calc non Af Amer >60 >60 mL/min   GFR calc Af Amer >60 >60 mL/min   Anion gap 8 5 - 15    Comment: Performed at [facility], [place] [address] [place], [place] [address]   Dg Chest 2 View  Result Date: 06/14/2019 CLINICAL DATA:  Chest pain EXAM: CHEST - 2 VIEW COMPARISON:  None. FINDINGS: There is cardiomegaly. Mildly increased interstitial markings seen throughout both lungs. A small left pleural effusion is seen. There is a minimally displaced posterior to left 6 rib fracture. Overlying subcutaneous emphysema seen in the chest wall. IMPRESSION: Cardiomegaly and interstitial edema. Small left pleural effusion Minimally displaced posterior left 6 rib fracture. Subcutaneous emphysema overlying the  left chest wall. Electronically Signed   By: Prudencio Pair M.D.   On: 06/14/2019 21:13   Ct Chest Wo Contrast  Result Date: 06/14/2019 CLINICAL DATA:  85 year old female with history of trauma from a fall this morning. EXAM: CT CHEST WITHOUT CONTRAST TECHNIQUE: Multidetector CT imaging of the chest was performed following the standard protocol without IV contrast. COMPARISON:  No priors. FINDINGS: Cardiovascular: Heart size is normal. There is no significant pericardial fluid, thickening or pericardial calcification. There is aortic atherosclerosis, as well as atherosclerosis of the great vessels of the mediastinum and the coronary arteries, including calcified atherosclerotic plaque in the left anterior descending and right coronary arteries. Severe calcifications of the mitral annulus. Dilatation of the pulmonic trunk (4 cm in diameter), concerning for associated pulmonary arterial hypertension. Mediastinum/Nodes: No pathologically enlarged mediastinal or hilar lymph nodes. Please note that accurate exclusion of hilar adenopathy is limited on noncontrast CT scans. Esophagus is unremarkable in appearance. No axillary lymphadenopathy. Lungs/Pleura: Small left pneumothorax. Small volume of low-intermediate attenuation fluid lying dependently in the left hemithorax, compatible with a small effusion at is partially hemorrhagic. Some associated passive atelectasis in portions of the left lung, both in the basal segments of the left lower lobe and inferior segment of the lingula. Some dependent subsegmental atelectasis in the basal segments of the right lower lobe. Remaining portions of the right lung are otherwise clear. No right pleural effusion. Upper Abdomen: Aortic atherosclerosis. Musculoskeletal: Multiple left-sided rib fractures are noted, as follows. Posterior left third rib fracture and anterolateral left third rib fracture, both of which are mildly displaced. Posterior left fourth rib fracture, nondisplaced  and anterolateral left fourth rib fracture mildly comminuted and displaced. Posterior left fifth rib fracture mildly displaced and lateral left fifth rib fracture mildly comminuted and displaced. Posterior left sixth rib fracture nondisplaced, and lateral left sixth rib fracture mildly comminuted and displaced. Extensive subcutaneous emphysema in the left chest wall tracking both caudally and cephalad. IMPRESSION: 1. Multiple segmental left-sided rib fractures with small left hemopneumothorax and extensive subcutaneous emphysema in the left chest wall. 2. Dilatation of the pulmonic trunk (4 cm in diameter), concerning for pulmonary arterial hypertension. 3. Aortic atherosclerosis, in addition to 2 vessel coronary artery disease. Critical Value/emergent results were called by telephone at the time of interpretation on 06/14/2019 at 10:24 pm to provider Blanchie Dessert MD,  who verbally acknowledged these results. Aortic Atherosclerosis (ICD10-I70.0). Electronically Signed   By: Vinnie Langton M.D.   On: 06/14/2019 22:24    Pending Labs Unresulted Labs (From admission, onward)    Start     Ordered   06/15/19 0500  CBC  Tomorrow morning,   R     06/14/19 2322   06/15/19 [id number]  Basic metabolic panel  Tomorrow morning,   R     06/14/19 2322   06/15/19 0134  SARS Coronavirus 2 [facility] order, Performed in [facility] hospital lab) Nasopharyngeal Nasopharyngeal Swab  (Symptomatic/High Risk of Exposure/Tier 1 Patients Labs with Precautions)  Once,   STAT    Question Answer Comment  Is this test for diagnosis or screening Screening   Symptomatic for COVID-19 as defined by CDC No   Hospitalized for COVID-19 No   Admitted to ICU for COVID-19 No   Previously tested for COVID-19 Yes   Resident in a congregate (group) care setting No   Employed in healthcare setting No   Pregnant No      06/15/19 0134          Vitals/Pain Today's Vitals   06/15/19 0000 06/15/19 0059 06/15/19 0100 06/15/19 0115  BP:  117/64  131/82   Pulse: 80  88 81  Resp:      Temp:      TempSrc:      SpO2: 91%  (!) 87% 92%  Height:      PainSc:  5       Isolation Precautions No active isolations  Medications Medications  acetaminophen (TYLENOL) tablet 650 mg (has no administration in time range)  oxyCODONE (Oxy IR/ROXICODONE) immediate release tablet 5 mg (5 mg Oral Given 06/15/19 0059)  morphine 2 MG/ML injection 2-4 mg (has no administration in time range)  ondansetron (ZOFRAN-ODT) disintegrating tablet 4 mg (has no administration in time range)    Or  ondansetron (ZOFRAN) injection 4 mg (has no administration in time range)  docusate sodium (COLACE) capsule 100 mg (100 mg Oral Given 06/15/19 0059)  donepezil (ARICEPT) tablet 5 mg (5 mg Oral Given 06/15/19 0059)  escitalopram (LEXAPRO) tablet 10 mg (has no administration in time range)  levothyroxine (SYNTHROID) tablet 100 mcg (has no administration in time range)  diphenhydrAMINE (BENADRYL) capsule 25 mg (25 mg Oral Given 06/15/19 0059)  acetaminophen (TYLENOL) tablet 1,000 mg (1,000 mg Oral Given 06/14/19 2104)    Mobility walks with device

## 2019-06-15 NOTE — Progress Notes (Signed)
Son staying with mother, and patient appears calm and relaxed. Son states he has been up all night caring for mother and requests to be able to switch out with his brother. Day shift RN will request MD permission to allow two sons Wilfred Lacy and Jeani Hawking) to switch as needed to be available for mother. Contact information for both sons obtained. Reisa Funk is patient's POA.

## 2019-06-15 NOTE — Progress Notes (Addendum)
Patient ID: Lisa Barrera, female   DOB: [birth year], 85 y.o.   MRN: [id number] [facility] Progress Note:   * No surgery found *  Subjective: Mental status is clear for her age.   Objective: Vital signs in last 24 hours: Temp:  [98.4 F (36.9 C)-98.6 F (37 C)] 98.4 F (36.9 C) (10/04 0757) Pulse Rate:  [64-88] 64 (10/04 0757) Resp:  [14-18] 14 (10/04 0757) BP: (117-151)/(58-82) 138/68 (10/04 0757) SpO2:  [87 %-95 %] 92 % (10/04 0757)  Intake/Output from previous day: No intake/output data recorded. Intake/Output this shift: Total I/O In: 237 [P.O.:237] Out: -   Physical Exam: Work of breathing is affected by splinting.  BS decreased on the left  Lab Results:  Results for orders placed or performed during the hospital encounter of 06/14/19 (from the past 48 hour(s))  CBC with Differential/Platelet     Status: Abnormal   Collection Time: 06/14/19 10:25 PM  Result Value Ref Range   WBC 15.1 (H) 4.0 - 10.5 K/uL   RBC 4.20 3.87 - 5.11 MIL/uL   Hemoglobin 13.9 12.0 - 15.0 g/dL   HCT 41.4 36.0 - 46.0 %   MCV 98.6 80.0 - 100.0 fL   MCH 33.1 26.0 - 34.0 pg   MCHC 33.6 30.0 - 36.0 g/dL   RDW 13.0 11.5 - 15.5 %   Platelets 183 150 - 400 K/uL   nRBC 0.0 0.0 - 0.2 %   Neutrophils Relative % 76 %   Neutro Abs 11.3 (H) 1.7 - 7.7 K/uL   Lymphocytes Relative 10 %   Lymphs Abs 1.6 0.7 - 4.0 K/uL   Monocytes Relative 14 %   Monocytes Absolute 2.1 (H) 0.1 - 1.0 K/uL   Eosinophils Relative 0 %   Eosinophils Absolute 0.0 0.0 - 0.5 K/uL   Basophils Relative 0 %   Basophils Absolute 0.0 0.0 - 0.1 K/uL   Immature Granulocytes 0 %   Abs Immature Granulocytes 0.04 0.00 - 0.07 K/uL    Comment: Performed at [facility], [place] [address], [place], [place] [id number]  Basic metabolic panel     Status: Abnormal   Collection Time: 06/14/19 10:25 PM  Result Value Ref Range   Sodium 132 (L) 135 - 145 mmol/L   Potassium 4.2 3.5 - 5.1 mmol/L   Chloride 100 98 -  111 mmol/L   CO2 24 22 - 32 mmol/L   Glucose, Bld 119 (H) 70 - 99 mg/dL   BUN 16 8 - 23 mg/dL   Creatinine, Ser 0.55 0.44 - 1.00 mg/dL   Calcium 9.4 8.9 - 10.3 mg/dL   GFR calc non Af Amer >60 >60 mL/min   GFR calc Af Amer >60 >60 mL/min   Anion gap 8 5 - 15    Comment: Performed at [facility], [place] [address] [place], [place] [address]  SARS Coronavirus 2 [facility] order, Performed in [facility] hospital lab) Nasopharyngeal Nasopharyngeal Swab     Status: None   Collection Time: 06/15/19  1:34 AM   Specimen: Nasopharyngeal Swab  Result Value Ref Range   SARS Coronavirus 2 NEGATIVE NEGATIVE    Comment: (NOTE) If result is NEGATIVE SARS-CoV-2 target nucleic acids are NOT DETECTED. The SARS-CoV-2 RNA is generally detectable in upper and lower  respiratory specimens during the acute phase of infection. The lowest  concentration of SARS-CoV-2 viral copies this assay can detect is 250  copies / mL. A negative result does not preclude SARS-CoV-2 infection  and should not be used as the sole basis for treatment or other  patient management decisions.  A negative result may occur with  improper specimen collection / handling, submission of specimen other  than nasopharyngeal swab, presence of viral mutation(s) within the  areas targeted by this assay, and inadequate number of viral copies  (<250 copies / mL). A negative result must be combined with clinical  observations, patient history, and epidemiological information. If result is POSITIVE SARS-CoV-2 target nucleic acids are DETECTED. The SARS-CoV-2 RNA is generally detectable in upper and lower  respiratory specimens dur ing the acute phase of infection.  Positive  results are indicative of active infection with SARS-CoV-2.  Clinical  correlation with patient history and other diagnostic information is  necessary to determine patient infection status.  Positive results do  not rule out bacterial infection or  co-infection with other viruses. If result is PRESUMPTIVE POSTIVE SARS-CoV-2 nucleic acids MAY BE PRESENT.   A presumptive positive result was obtained on the submitted specimen  and confirmed on repeat testing.  While 2019 novel coronavirus  (SARS-CoV-2) nucleic acids may be present in the submitted sample  additional confirmatory testing may be necessary for epidemiological  and / or clinical management purposes  to differentiate between  SARS-CoV-2 and other Sarbecovirus currently known to infect humans.  If clinically indicated additional testing with an alternate test  methodology [phone]) is advised. The SARS-CoV-2 RNA is generally  detectable in upper and lower respiratory sp ecimens during the acute  phase of infection. The expected result is Negative. Fact Sheet for Patients:  StrictlyIdeas.no Fact Sheet for Healthcare Providers: BankingDealers.co.za This test is not yet approved or cleared by the [place] FDA and has been authorized for detection and/or diagnosis of SARS-CoV-2 by FDA under an Emergency Use Authorization (EUA).  This EUA will remain in effect (meaning this test can be used) for the duration of the COVID-19 declaration under Section 564(b)(1) of the Act, 21 U.S.C. section 360bbb-3(b)(1), unless the authorization is terminated or revoked sooner. Performed at [facility], [place] [address] [place], [place] [address]   MRSA PCR Screening     Status: None   Collection Time: 06/15/19  3:58 AM   Specimen: Nasal Mucosa; Nasopharyngeal  Result Value Ref Range   MRSA by PCR NEGATIVE NEGATIVE    Comment:        The GeneXpert MRSA Assay (FDA approved for NASAL specimens only), is one component of a comprehensive MRSA colonization surveillance program. It is not intended to diagnose MRSA infection nor to guide or monitor treatment for MRSA infections. Performed at [facility],  [place] [address] [place], [place] [address]     Radiology/Results: Dg Chest 2 View  Result Date: 06/14/2019 CLINICAL DATA:  Chest pain EXAM: CHEST - 2 VIEW COMPARISON:  None. FINDINGS: There is cardiomegaly. Mildly increased interstitial markings seen throughout both lungs. A small left pleural effusion is seen. There is a minimally displaced posterior to left 6 rib fracture. Overlying subcutaneous emphysema seen in the chest wall. IMPRESSION: Cardiomegaly and interstitial edema. Small left pleural effusion Minimally displaced posterior left 6 rib fracture. Subcutaneous emphysema overlying the left chest wall. Electronically Signed   By: Prudencio Pair M.D.   On: 06/14/2019 21:13   Ct Chest Wo Contrast  Result Date: 06/14/2019 CLINICAL DATA:  85 year old female with history of trauma from a fall this morning. EXAM: CT CHEST WITHOUT CONTRAST TECHNIQUE: Multidetector CT imaging of the chest was performed  following the standard protocol without IV contrast. COMPARISON:  No priors. FINDINGS: Cardiovascular: Heart size is normal. There is no significant pericardial fluid, thickening or pericardial calcification. There is aortic atherosclerosis, as well as atherosclerosis of the great vessels of the mediastinum and the coronary arteries, including calcified atherosclerotic plaque in the left anterior descending and right coronary arteries. Severe calcifications of the mitral annulus. Dilatation of the pulmonic trunk (4 cm in diameter), concerning for associated pulmonary arterial hypertension. Mediastinum/Nodes: No pathologically enlarged mediastinal or hilar lymph nodes. Please note that accurate exclusion of hilar adenopathy is limited on noncontrast CT scans. Esophagus is unremarkable in appearance. No axillary lymphadenopathy. Lungs/Pleura: Small left pneumothorax. Small volume of low-intermediate attenuation fluid lying dependently in the left hemithorax, compatible with a small effusion at is partially  hemorrhagic. Some associated passive atelectasis in portions of the left lung, both in the basal segments of the left lower lobe and inferior segment of the lingula. Some dependent subsegmental atelectasis in the basal segments of the right lower lobe. Remaining portions of the right lung are otherwise clear. No right pleural effusion. Upper Abdomen: Aortic atherosclerosis. Musculoskeletal: Multiple left-sided rib fractures are noted, as follows. Posterior left third rib fracture and anterolateral left third rib fracture, both of which are mildly displaced. Posterior left fourth rib fracture, nondisplaced and anterolateral left fourth rib fracture mildly comminuted and displaced. Posterior left fifth rib fracture mildly displaced and lateral left fifth rib fracture mildly comminuted and displaced. Posterior left sixth rib fracture nondisplaced, and lateral left sixth rib fracture mildly comminuted and displaced. Extensive subcutaneous emphysema in the left chest wall tracking both caudally and cephalad. IMPRESSION: 1. Multiple segmental left-sided rib fractures with small left hemopneumothorax and extensive subcutaneous emphysema in the left chest wall. 2. Dilatation of the pulmonic trunk (4 cm in diameter), concerning for pulmonary arterial hypertension. 3. Aortic atherosclerosis, in addition to 2 vessel coronary artery disease. Critical Value/emergent results were called by telephone at the time of interpretation on 06/14/2019 at 10:24 pm to provider Blanchie Dessert MD, who verbally acknowledged these results. Aortic Atherosclerosis (ICD10-I70.0). Electronically Signed   By: Vinnie Langton M.D.   On: 06/14/2019 22:24   Dg Chest Port 1 View  Result Date: 06/15/2019 CLINICAL DATA:  Follow-up small left hemopneumothorax EXAM: PORTABLE CHEST 1 VIEW COMPARISON:  Chest radiograph from one day prior. FINDINGS: Stable cardiomediastinal silhouette with moderate cardiomegaly. Small left apical pneumothorax is stable,  less than 5%. No right pneumothorax. No pleural effusion. Mild pulmonary edema. Patchy opacity in retrocardiac lower left lung and in the peripheral left lung, not appreciably changed. Redemonstrated multiple lateral left rib fractures with subcutaneous emphysema throughout the lateral left chest wall. IMPRESSION: 1. Stable small left apical pneumothorax. 2. Stable moderate cardiomegaly with mild pulmonary edema. 3. Patchy retrocardiac lower left lung and peripheral left lung opacity, unchanged, favor a combination of atelectasis and contusion. Electronically Signed   By: Ilona Sorrel M.D.   On: 06/15/2019 10:11    Anti-infectives: Anti-infectives (From admission, onward)   None      Assessment/Plan: Problem List: Patient Active Problem List   Diagnosis Date Noted  . Pneumothorax 06/14/2019  . Acute bronchitis 11/20/2018  . Incontinent of urine 07/18/2018  . Urinary frequency 07/18/2018  . Hyponatremia 06/19/2018  . Osteopenia 04/12/2018  . Prediabetes 03/19/2018  . Dementia without behavioral disturbance ([place]) 03/19/2018  . Bilateral hearing loss 03/19/2018  . Sinus bradycardia by electrocardiogram 03/19/2018  . Right bundle branch block (RBBB) determined by electrocardiography 03/19/2018  .  Situational anxiety 03/08/2018  . Advanced care planning/counseling discussion 03/08/2018  . Overweight (BMI 25.0-29.9) 12/18/2017  . Vitamin D deficiency 05/22/2017  . Right sided weakness 12/03/2014  . Leukocytosis 06/24/2013  . Hypothyroidism 06/24/2013    Small apical pneumothorax-will recheck xray in am.---It is OK for the brothers to switch taking turns sitting with their mother.     * No surgery found *    LOS: 0 days   Matt B. Hassell Done, MD, [facility] [phone] beeper [phone]  06/15/2019 11:02 AM

## 2019-06-15 NOTE — ED Notes (Signed)
Floor RN unable to get report at this time. Will call back again.

## 2019-06-15 NOTE — Progress Notes (Signed)
Patient woke from sleep in confused and combative state. Patient states that she wants to go back to her room, and that we can not keep her her because she does not trust [place].  Patient pulled pulse ox off, and trying to remove IV.  Soft mittens applied. Patient continued to increase in confusion and anxiety, and trying to pull off her IV and soft mittens as well as hitting at RNs and NT.  MD contacted and order for Ativan received.  This was administered, but patient continued to increase in her anxious state.  Patient's son Ercie Plank) contacted.  Son talked with mother by phone for 15 minutes, and patient calmed throughout conversation.  Son came to stay with mother at 37. Patient is now sleeping well.

## 2019-06-16 ENCOUNTER — Inpatient Hospital Stay (HOSPITAL_COMMUNITY): Payer: Medicare Other

## 2019-06-16 MED ORDER — LORAZEPAM 0.5 MG PO TABS
0.5000 mg | ORAL_TABLET | Freq: Four times a day (QID) | ORAL | Status: DC | PRN
  Administered 2019-06-16 – 2019-06-17 (×4): 0.5 mg via ORAL
  Filled 2019-06-16 (×4): qty 1

## 2019-06-16 NOTE — Discharge Instructions (Signed)
Rib Fracture ° °A rib fracture is a break or crack in one of the bones of the ribs. The ribs are like a cage that goes around your upper chest. A broken or cracked rib is often painful, but most do not cause other problems. Most rib fractures usually heal on their own in 1-3 months. °Follow these instructions at home: °Managing pain, stiffness, and swelling °· If directed, apply ice to the injured area. °? Put ice in a plastic bag. °? Place a towel between your skin and the bag. °? Leave the ice on for 20 minutes, 2-3 times a day. °· Take over-the-counter and prescription medicines only as told by your doctor. °Activity °· Avoid activities that cause pain to the injured area. Protect your injured area. °· Slowly increase activity as told by your doctor. °General instructions °· Do deep breathing as told by your doctor. You may be told to: °? Take deep breaths many times a day. °? Cough many times a day while hugging a pillow. °? Use a device (incentive spirometer) to do deep breathing many times a day. °· Drink enough fluid to keep your pee (urine) clear or pale yellow. °· Do not wear a rib belt or binder. These do not allow you to breathe deeply. °· Keep all follow-up visits as told by your doctor. This is important. °Contact a doctor if: °· You have a fever. °Get help right away if: °· You have trouble breathing. °· You are short of breath. °· You cannot stop coughing. °· You cough up thick or bloody spit (sputum). °· You feel sick to your stomach (nauseous), throw up (vomit), or have belly (abdominal) pain. °· Your pain gets worse and medicine does not help. °Summary °· A rib fracture is a break or crack in one of the bones of the ribs. °· Apply ice to the injured area and take medicines for pain as told by your doctor. °· Take deep breaths and cough many times a day. Hug a pillow every time you cough. °This information is not intended to replace advice given to you by your health care provider. Make sure you  discuss any questions you have with your health care provider. °Document Released: 06/06/2008 Document Revised: 08/10/2017 Document Reviewed: 11/28/2016 °Elsevier Patient Education © 2020 Elsevier Inc. ° °

## 2019-06-16 NOTE — TOC Initial Note (Signed)
Transition of Care [facility]) - Initial/Assessment Note    Patient Details  Name: Lisa Barrera MRN: [id number] Date of Birth: 1925/09/30  [facility]) CM/SW Contact:    Ella Bodo, RN Phone Number: 06/16/2019, 4:53 PM  Clinical Narrative:    85 yo female with dementia had a fall from standing height with multiple left sided rib fractures and small anterior pneumothorax and moderate subcutaneous emphysema.  PTA, pt independent and living at [facility]; she has a hx of dementia.  PT/OT recommending SNF at discharge.  Confirmed with Raquel Sarna, CSW at facility (phone [phone], ext [phone]), that facility can accommodate pt at SNF.  Pt will need 3 midnight stay for Medicare, starting on 06/15/19.  Estimate dc on 06/18/19, if stable.  Updated pt's son, Jeani Hawking, and he is agreeable with this discharge plan.    Plan dc on 06/18/19, per Medicare 3 midnight stay:  Bedside nurse will need to call report to [phone], ext [phone].  If unable to leave report at this number, call [phone].   Fax # [phone] [facility] to take [facility] to [facility], entrance [address].               Expected Discharge Plan: Skilled Nursing Facility Barriers to Discharge: Continued Medical Work up   Patient Goals and CMS Choice   CMS Medicare.gov Compare Post Acute Care list provided to:: Patient Represenative (must comment)(son) Choice offered to / list presented to : Adult Children, HC POA / Guardian  Expected Discharge Plan and Services Expected Discharge Plan: [place]   Discharge Planning Services: CM Consult Post Acute Care Choice: [place] Living arrangements for the past 2 months: Assisted Living Facility([facility])                                      Prior Living Arrangements/Services Living arrangements for the past 2 months: Assisted Living Facility([facility]) Lives with:: Facility Resident Patient language and need for  interpreter reviewed:: Yes Do you feel safe going back to the place where you live?: Yes      Need for Family Participation in Patient Care: Yes (Comment) Care giver support system in place?: Yes (comment)   Criminal Activity/Legal Involvement Pertinent to Current Situation/Hospitalization: No - Comment as needed  Activities of Daily Living Home Assistive Devices/Equipment: Gilford Rile (specify type) ADL Screening (condition at time of admission) Patient's cognitive ability adequate to safely complete daily activities?: No Is the patient deaf or have difficulty hearing?: Yes Does the patient have difficulty seeing, even when wearing glasses/contacts?: No Does the patient have difficulty concentrating, remembering, or making decisions?: Yes Patient able to express need for assistance with ADLs?: No Does the patient have difficulty dressing or bathing?: Yes Independently performs ADLs?: No Does the patient have difficulty walking or climbing stairs?: Yes Weakness of Legs: Both Weakness of Arms/Hands: None  Permission Sought/Granted Permission sought to share information with : Chartered certified accountant granted to share information with : Yes, Verbal Permission Granted              Emotional Assessment Appearance:: Appears stated age   Affect (typically observed): Appropriate Orientation: : Oriented to Self, Oriented to Place Alcohol / Substance Use: Not Applicable Psych Involvement: No (comment)  Admission diagnosis:  Hemothorax on left [J94.2] Pneumothorax [J93.9] Fall, initial encounter [W19.XXXA] Closed fracture of multiple ribs of left side, initial encounter [  S22.42XA] Patient Active Problem List   Diagnosis Date Noted  . Pneumothorax 06/14/2019  . Acute bronchitis 11/20/2018  . Incontinent of urine 07/18/2018  . Urinary frequency 07/18/2018  . Hyponatremia 06/19/2018  . Osteopenia 04/12/2018  . Prediabetes 03/19/2018  . Dementia without behavioral  disturbance ([place]) 03/19/2018  . Bilateral hearing loss 03/19/2018  . Sinus bradycardia by electrocardiogram 03/19/2018  . Right bundle branch block (RBBB) determined by electrocardiography 03/19/2018  . Situational anxiety 03/08/2018  . Advanced care planning/counseling discussion 03/08/2018  . Overweight (BMI 25.0-29.9) 12/18/2017  . Vitamin D deficiency 05/22/2017  . Right sided weakness 12/03/2014  . Leukocytosis 06/24/2013  . Hypothyroidism 06/24/2013   PCP:  [name], MD Pharmacy:   [facility], [place] [address] Phone: [phone] Fax: [phone]  [id number] [address] [place] [address] Phone: [phone] Fax: [phone]        Readmission Risk Interventions Readmission Risk Prevention Plan 06/16/2019  Post Dischage Appt Not Complete  Appt Comments Pt will discharge to SNF  Medication Screening Complete  Transportation Screening Complete  Some recent data might be hidden   Reinaldo Raddle, RN, BSN  Trauma/Neuro ICU Case Manager [phone]

## 2019-06-16 NOTE — NC FL2 (Signed)
[place] LEVEL OF CARE SCREENING TOOL     IDENTIFICATION  Patient Name: Lisa Barrera Birthdate: 11-10-1925 Sex: female Admission Date (Current Location): 06/14/2019  [facility] and [place] Number:  Herbalist and Address:  [facility] [place], [place] [address] [place], [place] [address]      Provider Number: [id number]  Attending Physician Name and Address:  Md, Trauma, MD  Relative Name and Phone Number:       Current Level of Care: Hospital Recommended Level of Care: [place] Prior Approval Number:    Date Approved/Denied:   PASRR Number:    Discharge Plan: SNF    Current Diagnoses: Patient Active Problem List   Diagnosis Date Noted  . Pneumothorax 06/14/2019  . Acute bronchitis 11/20/2018  . Incontinent of urine 07/18/2018  . Urinary frequency 07/18/2018  . Hyponatremia 06/19/2018  . Osteopenia 04/12/2018  . Prediabetes 03/19/2018  . Dementia without behavioral disturbance ([place]) 03/19/2018  . Bilateral hearing loss 03/19/2018  . Sinus bradycardia by electrocardiogram 03/19/2018  . Right bundle branch block (RBBB) determined by electrocardiography 03/19/2018  . Situational anxiety 03/08/2018  . Advanced care planning/counseling discussion 03/08/2018  . Overweight (BMI 25.0-29.9) 12/18/2017  . Vitamin D deficiency 05/22/2017  . Right sided weakness 12/03/2014  . Leukocytosis 06/24/2013  . Hypothyroidism 06/24/2013    Orientation RESPIRATION BLADDER Height & Weight     Self, Situation  Normal Incontinent Weight:   Height:  5\' 2"  (157.5 cm)  BEHAVIORAL SYMPTOMS/MOOD NEUROLOGICAL BOWEL NUTRITION STATUS      Incontinent Diet(per order)  AMBULATORY STATUS COMMUNICATION OF NEEDS Skin   Extensive Assist(due to pain) Verbally Bruising(chest and back)                       Personal Care Assistance Level of Assistance  Total care       Total Care Assistance: Maximum assistance   Functional  Limitations Info             SPECIAL CARE FACTORS FREQUENCY  PT (By licensed PT), OT (By licensed OT)     PT Frequency: 5 times a week OT Frequency: 5 times a week            Contractures Contractures Info: Not present    Additional Factors Info  Code Status, Allergies Code Status Info: Full Code Allergies Info: none           Current Medications (06/16/2019):  This is the current hospital active medication list Current Facility-Administered Medications  Medication Dose Route Frequency Provider Last Rate Last Dose  . acetaminophen (TYLENOL) tablet 650 mg  650 mg Oral Q4H PRN Kinsinger, Arta Bruce, MD   650 mg at 06/16/19 1521  . diphenhydrAMINE (BENADRYL) capsule 25 mg  25 mg Oral Q6H PRN Kinsinger, Arta Bruce, MD   25 mg at 06/15/19 2014  . docusate sodium (COLACE) capsule 100 mg  100 mg Oral BID Kinsinger, Arta Bruce, MD   100 mg at 06/16/19 0902  . donepezil (ARICEPT) tablet 5 mg  5 mg Oral QHS Kinsinger, Arta Bruce, MD   5 mg at 06/15/19 2014  . escitalopram (LEXAPRO) tablet 10 mg  10 mg Oral Daily Kinsinger, Arta Bruce, MD   10 mg at 06/16/19 0902  . levothyroxine (SYNTHROID) tablet 100 mcg  100 mcg Oral Q0600 Kinsinger, Arta Bruce, MD   100 mcg at 06/16/19 0603  . LORazepam (ATIVAN) tablet 0.5 mg  0.5 mg Oral Q6H  PRN Saverio Danker, PA-C   0.5 mg at 06/16/19 1331  . morphine 2 MG/ML injection 2-4 mg  2-4 mg Intravenous Q2H PRN Kinsinger, Arta Bruce, MD   2 mg at 06/16/19 0719  . ondansetron (ZOFRAN-ODT) disintegrating tablet 4 mg  4 mg Oral Q6H PRN Kinsinger, Arta Bruce, MD       Or  . ondansetron [facility]) injection 4 mg  4 mg Intravenous Q6H PRN Kinsinger, Arta Bruce, MD      . oxyCODONE (Oxy IR/ROXICODONE) immediate release tablet 5 mg  5 mg Oral Q4H PRN Kinsinger, Arta Bruce, MD   5 mg at 06/16/19 0603     Discharge Medications: Please see discharge summary for a list of discharge medications.  Relevant Imaging Results:  Relevant Lab Results:   Additional  Information [id number]  Carles Collet, RN

## 2019-06-16 NOTE — Care Management (Addendum)
FL2 done, PASRR submitted and pending, faxed initial referral to [facility] SNF

## 2019-06-16 NOTE — Progress Notes (Signed)
Subjective: Anxious this morning.  Tolerating clear liquids well.  Otherwise breathing well.  Has dementia  Objective: Vital signs in last 24 hours: Temp:  [98.3 F (36.8 C)-98.5 F (36.9 C)] 98.5 F (36.9 C) (10/05 0804) Pulse Rate:  [62-73] 62 (10/05 0804) Resp:  [14-15] 15 (10/05 0804) BP: (107-151)/(59-78) 121/59 (10/05 0804) SpO2:  [91 %-96 %] 91 % (10/05 0804) Last BM Date: (PTA)  Intake/Output from previous day: 10/04 0701 - 10/05 0700 In: 591 [P.O.:591] Out: -  Intake/Output this shift: Total I/O In: 120 [P.O.:120] Out: -   PE: Gen: NAD, a bit anxious Heart: regular Lungs: CTAB, some splinting, but better. Only pulls 500 on IS Abd: soft, NT, ND, +BS Ext: MAE, NVI  Lab Results:  Recent Labs    06/14/19 2225  WBC 15.1*  HGB 13.9  HCT 41.4  PLT 183   BMET Recent Labs    06/14/19 2225  NA 132*  K 4.2  CL 100  CO2 24  GLUCOSE 119*  BUN 16  CREATININE 0.55  CALCIUM 9.4   PT/INR No results for input(s): LABPROT, INR in the last 72 hours. CMP     Component Value Date/Time   NA 132 (L) 06/14/2019 2225   NA 135 (A) 05/29/2019   K 4.2 06/14/2019 2225   CL 100 06/14/2019 2225   CL 102 05/29/2019   CO2 24 06/14/2019 2225   CO2 25 05/29/2019   GLUCOSE 119 (H) 06/14/2019 2225   BUN 16 06/14/2019 2225   BUN 13 05/29/2019   CREATININE 0.55 06/14/2019 2225   CREATININE 0.71 12/20/2017 0705   CALCIUM 9.4 06/14/2019 2225   CALCIUM 8.8 05/29/2019   PROT 5.7 04/16/2019   ALBUMIN 3.4 04/16/2019   AST 14 04/15/2019   ALT 11 04/15/2019   ALKPHOS 55 04/15/2019   BILITOT 1.3 (H) 06/19/2017 0000   GFRNONAA >60 06/14/2019 2225   GFRNONAA 78 04/16/2019   GFRNONAA 76 12/20/2017 0705   GFRAA >60 06/14/2019 2225   GFRAA 88 12/20/2017 0705   Lipase     Component Value Date/Time   LIPASE 13 06/24/2013 0807       Studies/Results: Dg Chest 2 View  Result Date: 06/14/2019 CLINICAL DATA:  Chest pain EXAM: CHEST - 2 VIEW COMPARISON:  None.  FINDINGS: There is cardiomegaly. Mildly increased interstitial markings seen throughout both lungs. A small left pleural effusion is seen. There is a minimally displaced posterior to left 6 rib fracture. Overlying subcutaneous emphysema seen in the chest wall. IMPRESSION: Cardiomegaly and interstitial edema. Small left pleural effusion Minimally displaced posterior left 6 rib fracture. Subcutaneous emphysema overlying the left chest wall. Electronically Signed   By: Prudencio Pair M.D.   On: 06/14/2019 21:13   Ct Chest Wo Contrast  Result Date: 06/14/2019 CLINICAL DATA:  85 year old female with history of trauma from a fall this morning. EXAM: CT CHEST WITHOUT CONTRAST TECHNIQUE: Multidetector CT imaging of the chest was performed following the standard protocol without IV contrast. COMPARISON:  No priors. FINDINGS: Cardiovascular: Heart size is normal. There is no significant pericardial fluid, thickening or pericardial calcification. There is aortic atherosclerosis, as well as atherosclerosis of the great vessels of the mediastinum and the coronary arteries, including calcified atherosclerotic plaque in the left anterior descending and right coronary arteries. Severe calcifications of the mitral annulus. Dilatation of the pulmonic trunk (4 cm in diameter), concerning for associated pulmonary arterial hypertension. Mediastinum/Nodes: No pathologically enlarged mediastinal or hilar lymph nodes. Please  note that accurate exclusion of hilar adenopathy is limited on noncontrast CT scans. Esophagus is unremarkable in appearance. No axillary lymphadenopathy. Lungs/Pleura: Small left pneumothorax. Small volume of low-intermediate attenuation fluid lying dependently in the left hemithorax, compatible with a small effusion at is partially hemorrhagic. Some associated passive atelectasis in portions of the left lung, both in the basal segments of the left lower lobe and inferior segment of the lingula. Some dependent  subsegmental atelectasis in the basal segments of the right lower lobe. Remaining portions of the right lung are otherwise clear. No right pleural effusion. Upper Abdomen: Aortic atherosclerosis. Musculoskeletal: Multiple left-sided rib fractures are noted, as follows. Posterior left third rib fracture and anterolateral left third rib fracture, both of which are mildly displaced. Posterior left fourth rib fracture, nondisplaced and anterolateral left fourth rib fracture mildly comminuted and displaced. Posterior left fifth rib fracture mildly displaced and lateral left fifth rib fracture mildly comminuted and displaced. Posterior left sixth rib fracture nondisplaced, and lateral left sixth rib fracture mildly comminuted and displaced. Extensive subcutaneous emphysema in the left chest wall tracking both caudally and cephalad. IMPRESSION: 1. Multiple segmental left-sided rib fractures with small left hemopneumothorax and extensive subcutaneous emphysema in the left chest wall. 2. Dilatation of the pulmonic trunk (4 cm in diameter), concerning for pulmonary arterial hypertension. 3. Aortic atherosclerosis, in addition to 2 vessel coronary artery disease. Critical Value/emergent results were called by telephone at the time of interpretation on 06/14/2019 at 10:24 pm to provider Blanchie Dessert MD, who verbally acknowledged these results. Aortic Atherosclerosis (ICD10-I70.0). Electronically Signed   By: Vinnie Langton M.D.   On: 06/14/2019 22:24   Dg Chest Port 1 View  Result Date: 06/16/2019 CLINICAL DATA:  Follow-up left pneumothorax EXAM: PORTABLE CHEST 1 VIEW COMPARISON:  06/15/2019 FINDINGS: Stable small left apical pneumothorax is noted. Considerable left-sided subcutaneous emphysema is again noted and stable. Left rib fractures are again seen and stable improved aeration in the left lung is noted when compare with the prior exam. Right lung remains clear. Cardiac shadow remains mildly enlarged with aortic  calcifications identified. IMPRESSION: Improving aeration particularly on the left. Small left apical pneumothorax remains. Electronically Signed   By: Inez Catalina M.D.   On: 06/16/2019 07:58   Dg Chest Port 1 View  Result Date: 06/15/2019 CLINICAL DATA:  Follow-up small left hemopneumothorax EXAM: PORTABLE CHEST 1 VIEW COMPARISON:  Chest radiograph from one day prior. FINDINGS: Stable cardiomediastinal silhouette with moderate cardiomegaly. Small left apical pneumothorax is stable, less than 5%. No right pneumothorax. No pleural effusion. Mild pulmonary edema. Patchy opacity in retrocardiac lower left lung and in the peripheral left lung, not appreciably changed. Redemonstrated multiple lateral left rib fractures with subcutaneous emphysema throughout the lateral left chest wall. IMPRESSION: 1. Stable small left apical pneumothorax. 2. Stable moderate cardiomegaly with mild pulmonary edema. 3. Patchy retrocardiac lower left lung and peripheral left lung opacity, unchanged, favor a combination of atelectasis and contusion. Electronically Signed   By: Ilona Sorrel M.D.   On: 06/15/2019 10:11    Anti-infectives: Anti-infectives (From admission, onward)   None       Assessment/Plan Fall Left rib fractures with small apical PTX - CXR shows improved aeration today.  Tiny apical PTX stable.  Cont pulm toilet Chronic medical problems - home meds have all been resumed FEN - regular diet VTE - given fall risk, will just keep SCDs in place ID - none Dispo - SNF per therapies, patient lives [facility]  [place], hopefully they have a SNF   LOS: 1 day    Henreitta Cea , [facility] 06/16/2019, 10:56 AM Pager: [phone]

## 2019-06-16 NOTE — Plan of Care (Signed)

## 2019-06-17 LAB — CBC
HCT: 41.4 % (ref 36.0–46.0)
Hemoglobin: 14.5 g/dL (ref 12.0–15.0)
MCH: 34.3 pg — ABNORMAL HIGH (ref 26.0–34.0)
MCHC: 35 g/dL (ref 30.0–36.0)
MCV: 97.9 fL (ref 80.0–100.0)
Platelets: 162 10*3/uL (ref 150–400)
RBC: 4.23 MIL/uL (ref 3.87–5.11)
RDW: 13 % (ref 11.5–15.5)
WBC: 10.2 10*3/uL (ref 4.0–10.5)
nRBC: 0 % (ref 0.0–0.2)

## 2019-06-17 LAB — NOVEL CORONAVIRUS, NAA (HOSP ORDER, SEND-OUT TO REF LAB; TAT 18-24 HRS): SARS-CoV-2, NAA: NOT DETECTED

## 2019-06-17 NOTE — Progress Notes (Signed)
Physical Therapy Treatment Patient Details Name: Lisa Barrera MRN: [id number] DOB: 1926-05-11 Today's Date: 06/17/2019    History of Present Illness 85 yo female with dementia had a fall from standing height with multiple left sided rib fractures and small anterior pneumothorax and moderate subcutaneous emphysema    PT Comments    Pt agitated and anxious during session, requiring PT cueing to maintain attention. Pt perseverating on returning to her prior facility. Pt requesting to return to bed to rest and get in a comfortable position. Pt continues to require assistance for all OOB mobility due to balance, gait, and functional mobility deficits. Pt will continue to benefit from PT POC to reduce falls risk.   Follow Up Recommendations  SNF;Supervision/Assistance - 24 hour     Equipment Recommendations  (tbd by facility)    Recommendations for Other Services       Precautions / Restrictions Precautions Precautions: Fall Restrictions Weight Bearing Restrictions: No    Mobility  Bed Mobility Overal bed mobility: Needs Assistance Bed Mobility: Sit to Supine       Sit to supine: Supervision      Transfers Overall transfer level: Needs assistance Equipment used: Rolling walker (2 wheeled) Transfers: Sit to/from Stand Sit to Stand: Min guard            Ambulation/Gait Ambulation/Gait assistance: Min guard Gait Distance (Feet): 3 Feet Assistive device: Rolling walker (2 wheeled) Gait Pattern/deviations: Step-to pattern Gait velocity: decreased Gait velocity interpretation: <1.8 ft/sec, indicate of risk for recurrent falls General Gait Details: short steps from recliner to bed   Stairs             Wheelchair Mobility    Modified Rankin (Stroke Patients Only)       Balance Overall balance assessment: History of Falls                                          Cognition Arousal/Alertness: Awake/alert Behavior During Therapy:  Anxious Overall Cognitive Status: History of cognitive impairments - at baseline                                        Exercises General Exercises - Lower Extremity Ankle Circles/Pumps: AROM;5 reps;Both Gluteal Sets: AROM;Both;5 reps Straight Leg Raises: AROM;Both;5 reps    General Comments General comments (skin integrity, edema, etc.): pt upset throughout session, perseverating on wanting to return to her facility      Pertinent Vitals/Pain Pain Assessment: Faces Faces Pain Scale: Hurts little more Pain Location: left flank and chest Pain Descriptors / Indicators: Grimacing;Discomfort;Guarding Pain Intervention(s): Limited activity within patient's tolerance    Home Living                      Prior Function            PT Goals (current goals can now be found in the care plan section) Acute Rehab PT Goals Patient Stated Goal: to go home Progress towards PT goals: Progressing toward goals    Frequency    Min 3X/week      PT Plan Current plan remains appropriate    Co-evaluation              AM-PAC PT "6 Clicks" Mobility   Outcome Measure  Help needed turning  from your back to your side while in a flat bed without using bedrails?: None Help needed moving from lying on your back to sitting on the side of a flat bed without using bedrails?: None Help needed moving to and from a bed to a chair (including a wheelchair)?: A Little Help needed standing up from a chair using your arms (e.g., wheelchair or bedside chair)?: A Little Help needed to walk in hospital room?: A Lot Help needed climbing 3-5 steps with a railing? : A Lot 6 Click Score: 18    End of Session Equipment Utilized During Treatment: Gait belt Activity Tolerance: Patient limited by pain;Treatment limited secondary to agitation Patient left: in bed;with call bell/phone within reach;with bed alarm set Nurse Communication: Mobility status PT Visit Diagnosis: Other  abnormalities of gait and mobility (R26.89);History of falling (Z91.81);Pain Pain - Right/Left: Left Pain - part of body: (flank)     Time: [id number] PT Time Calculation (min) (ACUTE ONLY): 8 min  Charges:  $Therapeutic Activity: 8-22 mins                     Zenaida Niece, PT, DPT Acute Rehabilitation Pager: [phone]    Zenaida Niece 06/17/2019, 2:09 PM

## 2019-06-17 NOTE — Plan of Care (Signed)
  Problem: Clinical Measurements: Goal: Will remain free from infection Outcome: Progressing   Problem: Activity: Goal: Risk for activity intolerance will decrease Outcome: Progressing   Problem: Coping: Goal: Level of anxiety will decrease Outcome: Progressing   Problem: Elimination: Goal: Will not experience complications related to urinary retention Outcome: Progressing   Problem: Safety: Goal: Ability to remain free from injury will improve Outcome: Progressing

## 2019-06-17 NOTE — Evaluation (Signed)
Occupational Therapy Evaluation Patient Details Name: Lisa Barrera MRN: [id number] DOB: [birth year] Today's Date: 06/17/2019    History of Present Illness 85 yo female with dementia had a fall from standing height with multiple left sided rib fractures and small anterior pneumothorax and moderate subcutaneous emphysema   Clinical Impression   Pt admitted with the above diagnoses and presents with below problem list. Pt will benefit from continued acute OT to address the below listed deficits and maximize independence with basic ADLs prior to d/c to venue below. PTA pt was independent with basic ADLs. Pt currently min A for UB ADLs, mod A for LB ADLs, min A for functional transfers and mobility in the room.      Follow Up Recommendations  SNF    Equipment Recommendations  Other (comment)(defer to next venue)    Recommendations for Other Services       Precautions / Restrictions Precautions Precautions: Fall Restrictions Weight Bearing Restrictions: No      Mobility Bed Mobility Overal bed mobility: Needs Assistance Bed Mobility: Supine to Sit     Supine to sit: Mod assist     General bed mobility comments: moderate assist to elevate trunk due to pain on left flank, assist to rotate hips to EOB  Transfers Overall transfer level: Needs assistance Equipment used: Rolling walker (2 wheeled) Transfers: Sit to/from Stand Sit to Stand: Min assist         General transfer comment: min assist to power up from bed and toilet, min assist for stability    Balance Overall balance assessment: History of Falls                                         ADL either performed or assessed with clinical judgement   ADL Overall ADL's : Needs assistance/impaired Eating/Feeding: Set up;Sitting   Grooming: Set up;Sitting;Min guard;Standing Grooming Details (indicate cue type and reason): leaned into sink for external support to wash hands Upper Body Bathing:  Sitting;Minimal assistance   Lower Body Bathing: Moderate assistance;Sit to/from stand   Upper Body Dressing : Minimal assistance;Sitting   Lower Body Dressing: Moderate assistance;Sit to/from stand   Toilet Transfer: Minimal assistance;RW;Ambulation(3n1 over toilet)   Toileting- Clothing Manipulation and Hygiene: Moderate assistance;Sit to/from stand   Tub/ Shower Transfer: Minimal assistance;Ambulation;Walk-in shower   Functional mobility during ADLs: Minimal assistance;Rolling walker General ADL Comments: Pt completed bed mobility, toilet transfer, pericare, and grooming task.      Vision         Perception     Praxis      Pertinent Vitals/Pain Pain Assessment: Faces Faces Pain Scale: Hurts even more Pain Location: left flank and chest Pain Descriptors / Indicators: Grimacing;Discomfort;Guarding Pain Intervention(s): Monitored during session;Repositioned;Limited activity within patient's tolerance     Hand Dominance Right   Extremity/Trunk Assessment Upper Extremity Assessment Upper Extremity Assessment: Generalized weakness   Lower Extremity Assessment Lower Extremity Assessment: Defer to PT evaluation       Communication Communication Communication: HOH   Cognition Arousal/Alertness: Awake/alert Behavior During Therapy: Anxious;Agitated;WFL for tasks assessed/performed Overall Cognitive Status: History of cognitive impairments - at baseline                                     General Comments  Pt upset throughout session, "I don't understand what's  going on." "I want to go back to my room." "They keep moving me around to different rooms" Therapist attempting to provide reassurance and calm,listening presence and redirection during session.  Nursing notified.    Exercises     Shoulder Instructions      Home Living Family/patient expects to be discharged to:: Assisted living                             Home Equipment:  Walker - 4 wheels          Prior Functioning/Environment Level of Independence: Independent                 OT Problem List: Decreased strength;Decreased activity tolerance;Impaired balance (sitting and/or standing);Decreased cognition;Decreased knowledge of use of DME or AE;Decreased knowledge of precautions;Decreased safety awareness;Pain      OT Treatment/Interventions: Self-care/ADL training;Energy conservation;DME and/or AE instruction;Therapeutic activities;Patient/family education;Balance training    OT Goals(Current goals can be found in the care plan section) Acute Rehab OT Goals Patient Stated Goal: to go home OT Goal Formulation: With patient Time For Goal Achievement: 07/01/19 Potential to Achieve Goals: Good ADL Goals Pt Will Perform Upper Body Bathing: with set-up;sitting Pt Will Perform Lower Body Bathing: sit to/from stand;with min guard assist Pt Will Perform Upper Body Dressing: with set-up;sitting Pt Will Perform Lower Body Dressing: with min assist;sit to/from stand Pt Will Transfer to Toilet: ambulating;with supervision Pt Will Perform Toileting - Clothing Manipulation and hygiene: with supervision;sit to/from stand Additional ADL Goal #1: Pt will complete bed mobility at supervision level to prepare for OOB ADLs.  OT Frequency: Min 2X/week   Barriers to D/C:            Co-evaluation              AM-PAC OT "6 Clicks" Daily Activity     Outcome Measure Help from another person eating meals?: None Help from another person taking care of personal grooming?: A Little Help from another person toileting, which includes using toliet, bedpan, or urinal?: A Little Help from another person bathing (including washing, rinsing, drying)?: A Lot Help from another person to put on and taking off regular upper body clothing?: A Little Help from another person to put on and taking off regular lower body clothing?: A Lot 6 Click Score: 17   End of Session  Equipment Utilized During Treatment: Rolling walker Nurse Communication: Other (comment)(see general comments)  Activity Tolerance: Patient tolerated treatment well;Other (comment)(anxious/agitated during session "I'm so confused") Patient left: in chair;with call bell/phone within reach;with chair alarm set;Other (comment)(fall pads in place in front of reclienr)  OT Visit Diagnosis: Unsteadiness on feet (R26.81);Muscle weakness (generalized) (M62.81);History of falling (Z91.81);Pain;Other symptoms and signs involving cognitive function                Time: 1003-1025 OT Time Calculation (min): 22 min Charges:  OT General Charges $OT Visit: 1 Visit OT Evaluation $OT Eval Low Complexity: [place], OT Acute Rehabilitation Services Pager: [phone] Office: [phone]   Hortencia Pilar 06/17/2019, 10:43 AM

## 2019-06-17 NOTE — Progress Notes (Signed)
Patient ID: Richardson Landry, female   DOB: May 24, 1926, 85 y.o.   MRN: [id number]       Subjective: No new complaints except can't remember how to work the TV.  Pulling 1000 on IS today.  No complaints of pain.  Objective: Vital signs in last 24 hours: Temp:  [97.6 F (36.4 C)-98.9 F (37.2 C)] 97.9 F (36.6 C) (10/06 0731) Pulse Rate:  [58-67] 60 (10/06 0731) Resp:  [14-18] 14 (10/06 0731) BP: (120-146)/(58-82) 146/62 (10/06 0731) SpO2:  [90 %-94 %] 92 % (10/06 0731) Last BM Date: (pt unable to answer)  Intake/Output from previous day: 10/05 0701 - 10/06 0700 In: 440 [P.O.:440] Out: -  Intake/Output this shift: No intake/output data recorded.  PE: Gen: NAD Heart: regular Lungs: CTAB, chest minimally tender Abd: soft, NT, ND, +BS Psych: significant dementia, but pleasant  Lab Results:  Recent Labs    06/14/19 2225 06/17/19 0325  WBC 15.1* 10.2  HGB 13.9 14.5  HCT 41.4 41.4  PLT 183 162   BMET Recent Labs    06/14/19 2225  NA 132*  K 4.2  CL 100  CO2 24  GLUCOSE 119*  BUN 16  CREATININE 0.55  CALCIUM 9.4   PT/INR No results for input(s): LABPROT, INR in the last 72 hours. CMP     Component Value Date/Time   NA 132 (L) 06/14/2019 2225   NA 135 (A) 05/29/2019   K 4.2 06/14/2019 2225   CL 100 06/14/2019 2225   CL 102 05/29/2019   CO2 24 06/14/2019 2225   CO2 25 05/29/2019   GLUCOSE 119 (H) 06/14/2019 2225   BUN 16 06/14/2019 2225   BUN 13 05/29/2019   CREATININE 0.55 06/14/2019 2225   CREATININE 0.71 12/20/2017 0705   CALCIUM 9.4 06/14/2019 2225   CALCIUM 8.8 05/29/2019   PROT 5.7 04/16/2019   ALBUMIN 3.4 04/16/2019   AST 14 04/15/2019   ALT 11 04/15/2019   ALKPHOS 55 04/15/2019   BILITOT 1.3 (H) 06/19/2017 0000   GFRNONAA >60 06/14/2019 2225   GFRNONAA 78 04/16/2019   GFRNONAA 76 12/20/2017 0705   GFRAA >60 06/14/2019 2225   GFRAA 88 12/20/2017 0705   Lipase     Component Value Date/Time   LIPASE 13 06/24/2013 0807        Studies/Results: Dg Chest Port 1 View  Result Date: 06/16/2019 CLINICAL DATA:  Follow-up left pneumothorax EXAM: PORTABLE CHEST 1 VIEW COMPARISON:  06/15/2019 FINDINGS: Stable small left apical pneumothorax is noted. Considerable left-sided subcutaneous emphysema is again noted and stable. Left rib fractures are again seen and stable improved aeration in the left lung is noted when compare with the prior exam. Right lung remains clear. Cardiac shadow remains mildly enlarged with aortic calcifications identified. IMPRESSION: Improving aeration particularly on the left. Small left apical pneumothorax remains. Electronically Signed   By: Inez Catalina M.D.   On: 06/16/2019 07:58    Anti-infectives: Anti-infectives (From admission, onward)   None       Assessment/Plan Fall Left rib fractures with small apical PTX - pulling better on IS.  Breathing well. Chronic medical problems - home meds have all been resumed FEN - regular diet VTE - given fall risk, will just keep SCDs in place ID - none Dispo - SNF tomorrow   LOS: 2 days    Henreitta Cea , [facility] Surgery 06/17/2019, 9:36 AM Pager: [phone]

## 2019-06-17 NOTE — Plan of Care (Signed)
  Problem: Clinical Measurements: Goal: Will remain free from infection Outcome: Progressing Goal: Respiratory complications will improve Outcome: Progressing   Problem: Coping: Goal: Level of anxiety will decrease Outcome: Progressing   Problem: Safety: Goal: Ability to remain free from injury will improve Outcome: Progressing   Problem: Skin Integrity: Goal: Risk for impaired skin integrity will decrease Outcome: Progressing   Jenene Slicker, RN 06/17/2019 1018

## 2019-06-18 MED ORDER — OXYCODONE HCL 5 MG PO TABS: 5.0000 mg | ORAL_TABLET | Freq: Four times a day (QID) | ORAL | 0 refills | Status: DC | PRN

## 2019-06-18 NOTE — TOC Transition Note (Addendum)
Transition of Care [facility]) - CM/SW Discharge Note   Patient Details  Name: Lisa Barrera MRN: [id number] Date of Birth: Nov 13, 1925  [facility] CM/SW Contact:  Ella Bodo, RN Phone Number: 06/18/2019, 1:32 PM   Clinical Narrative:  Pt medically stable for discharge to [facility] today.  Spoke with Raquel Sarna, CSW at facility, and confirmed bed availability for this afternoon.  PTAR notified of need for transport to facility at 1332.  Per bedside nurse, Danae Chen, patient is ready for transport.  Updated patient's son, Jeani Hawking on discharge.        Barriers to Discharge: Continued Medical Work up   Patient Goals and CMS Choice   CMS Medicare.gov Compare Post Acute Care list provided to:: Patient Represenative (must comment)(son) Choice offered to / list presented to : Adult Children, [facility] POA / Guardian  Discharge Placement  [facility]                     Discharge Plan and Services   Discharge Planning Services: CM Consult Post Acute Care Choice: [place]                                 Readmission Risk Interventions Readmission Risk Prevention Plan 06/18/2019 06/16/2019  Post Dischage Appt Complete Not Complete  Appt Comments - Pt will discharge to SNF  Medication Screening - Complete  Transportation Screening - Complete  Some recent data might be hidden   Reinaldo Raddle, RN, BSN  Trauma/Neuro ICU Case Manager [phone]

## 2019-06-18 NOTE — Discharge Summary (Signed)
     Patient ID: Lisa Barrera [id number] 1926-07-30 84 y.o.  Admit date: 06/14/2019 Discharge date: 06/18/2019  Admitting Diagnosis: Fall Left rib fxs with small apical PTX  Discharge Diagnosis Patient Active Problem List   Diagnosis Date Noted  . Pneumothorax 06/14/2019  . Acute bronchitis 11/20/2018  . Incontinent of urine 07/18/2018  . Urinary frequency 07/18/2018  . Hyponatremia 06/19/2018  . Osteopenia 04/12/2018  . Prediabetes 03/19/2018  . Dementia without behavioral disturbance ([facility]) 03/19/2018  . Bilateral hearing loss 03/19/2018  . Sinus bradycardia by electrocardiogram 03/19/2018  . Right bundle branch block (RBBB) determined by electrocardiography 03/19/2018  . Situational anxiety 03/08/2018  . Advanced care planning/counseling discussion 03/08/2018  . Overweight (BMI 25.0-29.9) 12/18/2017  . Vitamin D deficiency 05/22/2017  . Right sided weakness 12/03/2014  . Leukocytosis 06/24/2013  . Hypothyroidism 06/24/2013    Consultants none  Reason for Admission: 85 yo female fell earlier this morning at SNF. She later complained of pain in her left shoulder and then left chest. She got an XR at the facility and was then transferred to the ER for rib fractures. Currently, she does not complain of pain anywhere. She does not feel short of breath. She has dementia at baseline that has been worsening.  Procedures none  Hospital Course:  The patient was admitted for pain control and pulmonary toileting.  Repeat CXR the following morning revealed a stable tiny apical PTX.  This remained stable and never required intervention.  She had great pain control with minimal pain medications.  She was evaluated by therapies and SNF was recommended.  This was arranged at the currently place she lives for ALF.  On HD 3, she was stable for DC to the SNF with pcp follow up as needed.  Her other medical problems remained stable throughout her admission.    Physical Exam: Gen:  pleasantly demented Heart: regular Lungs: CTAB, no chest pain Abd: soft, NT, ND, +BS, reducible umbilical hernia Ext: MAE  Allergies as of 06/18/2019   No Known Allergies     Medication List    TAKE these medications   acetaminophen 325 MG tablet Commonly known as: TYLENOL Take 650 mg by mouth every 6 (six) hours as needed for moderate pain.   aspirin EC 81 MG tablet Take 1 tablet (81 mg total) by mouth daily.   calcium-vitamin D 500-200 MG-UNIT tablet Commonly known as: Oscal 500/200 D-3 Take 1 tablet by mouth 2 (two) times daily.   donepezil 5 MG tablet Commonly known as: ARICEPT Take 1 tablet (5 mg total) by mouth at bedtime.   escitalopram 10 MG tablet Commonly known as: LEXAPRO Take 10 mg by mouth daily.   levothyroxine 100 MCG tablet Commonly known as: SYNTHROID Take 100 mcg by mouth daily before breakfast.   oxyCODONE 5 MG immediate release tablet Commonly known as: Oxy IR/ROXICODONE Take 1 tablet (5 mg total) by mouth every 6 (six) hours as needed for moderate pain.        Follow-up Information    [name], MD. Call.   Specialty: Internal Medicine Why: Call to arrange post-hospitalization follow up appointment with your primary care physician in about 3 weeks Contact information: [place] [phone]           Signed: Saverio Danker, [facility] 06/18/2019, 9:14 AM Pager: [phone]

## 2019-06-18 NOTE — Progress Notes (Signed)
A discharge packet printed and will be sent to [facility] via [facility].  Report given to [place] Loss adjuster, chartered)

## 2019-06-18 NOTE — Progress Notes (Signed)
Note sent to Ellan Lambert, Case Manager in regards to available bed today for SNF

## 2019-06-19 ENCOUNTER — Encounter: Payer: Self-pay | Admitting: Internal Medicine

## 2019-06-19 ENCOUNTER — Non-Acute Institutional Stay (SKILLED_NURSING_FACILITY): Payer: Medicare Other | Admitting: Internal Medicine

## 2019-06-19 DIAGNOSIS — S270XXA Traumatic pneumothorax, initial encounter: Secondary | ICD-10-CM | POA: Diagnosis not present

## 2019-06-19 DIAGNOSIS — F0390 Unspecified dementia without behavioral disturbance: Secondary | ICD-10-CM | POA: Diagnosis not present

## 2019-06-19 DIAGNOSIS — S2242XA Multiple fractures of ribs, left side, initial encounter for closed fracture: Secondary | ICD-10-CM

## 2019-06-19 DIAGNOSIS — F333 Major depressive disorder, recurrent, severe with psychotic symptoms: Secondary | ICD-10-CM | POA: Diagnosis not present

## 2019-06-19 DIAGNOSIS — E039 Hypothyroidism, unspecified: Secondary | ICD-10-CM

## 2019-06-19 DIAGNOSIS — E871 Hypo-osmolality and hyponatremia: Secondary | ICD-10-CM | POA: Diagnosis not present

## 2019-06-19 NOTE — Progress Notes (Signed)
Provider:  Veleta Miners MD Location:    Nursing Home Room Number: [id number] Place of Service:  ALF ([phone])  PCP: [name], MD Patient Care Team: [name], MD as PCP - General (Internal Medicine) Mast, Man X, NP as Nurse Practitioner (Internal Medicine)  Extended Emergency Contact Information Primary Emergency Contact: Lovecchio,[name] Address: [address]          [place], [place] [address] [name] of [place] Phone: [phone] Mobile Phone: [phone] Relation: Son Secondary Emergency Contact: Dondero,Brett Address: [address]          [place], [place] [address] [name] of [place] Phone: [phone] Work Phone: [phone] Mobile Phone: [phone] Relation: Son  Code Status: DNR Goals of Care: Advanced Directive information Advanced Directives 06/19/2019  Does Patient Have a Medical Advance Directive? Yes  Type of Advance Directive Out of facility DNR (pink MOST or yellow form)  Does patient want to make changes to medical advance directive? No - Patient declined  Copy of [place] in Chart? -  Pre-existing out of facility DNR order (yellow form or pink MOST form) -      Chief Complaint  Patient presents with   New Admit To SNF    HPI: Patient is a 85 y.o. female seen today for admission to SNF for therapy  She was admitted in the hospital from 10/03-10/07 for Left Rib fractures with small apical pneumothorax Patient has h/ohypothyroidism, dementia,, sinus bradycardia, prediabetes, hyponatremia and recently got diagnosed with depression with Psychosis  Patient had a mechanical fall in the facility.  Usually she is pretty stable and walks with a walker.  She was sent to the hospital where she was found to have left apical pneumothorax less then 5% , multiple  left rib fractures and subcutaneous emphysema. She was admitted for monitoring. Fortunately she did not require any surgical intervention.  She is denied  any shortness of breath and her pain was controlled.  She is now discharged back to [facility] in SNF for further monitoring. But since patient has been here she has become very anxious confused.  She keeps screaming moaning keep calling her son.  She has told them that she is still in hospital and has to stay there for 2 weeks. When I entered the room patient stops moaning.  Denies any pain or shortness of breath.  Continues to be very confused.  Is able to walk with her walker.  Is able to dress herself and do all her ADLs Has not had any fever or chills or dysuria    Past Medical History:  Diagnosis Date   Eczema    Hypothyroid    Hypothyroidism    Hypothyroidism    Vitamin D deficiency    Past Surgical History:  Procedure Laterality Date   CHOLECYSTECTOMY N/A 06/24/2013   Procedure: LAPAROSCOPIC CHOLECYSTECTOMY WITH INTRAOPERATIVE CHOLANGIOGRAM;  Surgeon: Ralene Ok, MD;  Location: [facility];  Service: General;  Laterality: N/A;   TONSILLECTOMY      reports that she has never smoked. She has never used smokeless tobacco. She reports that she does not drink alcohol or use drugs. Social History   Socioeconomic History   Marital status: Widowed    Spouse name: Not on file   Number of children: 3   Years of education: College   Highest education level: Not on file  Occupational History   Occupation: Retired  Scientist, product/process development strain: Not hard at  all   Food insecurity    Worry: Never true    Inability: Never true   Transportation needs    Medical: No    Non-medical: No  Tobacco Use   Smoking status: Never Smoker   Smokeless tobacco: Never Used  Substance and Sexual Activity   Alcohol use: No    Alcohol/week: 0.0 standard drinks   Drug use: No   Sexual activity: Not on file  Lifestyle   Physical activity    Days per week: 7 days    Minutes per session: 30 min   Stress: Not at all  Relationships   Social connections     Talks on phone: More than three times a week    Gets together: More than three times a week    Attends religious service: Never    Active member of club or organization: No    Attends meetings of clubs or organizations: Never    Relationship status: Widowed   Intimate partner violence    Fear of current or ex partner: No    Emotionally abused: No    Physically abused: No    Forced sexual activity: No  Other Topics Concern   Not on file  Social History Narrative   Tobacco use, amount per day now: None      Past tobacco use, amount per day: None      How many years did you use tobacco: Never      Alcohol use (drinks per week): None      Diet: 2 meals a day at [facility]      Do you drink/eat things with caffeine? Coffee      Marital status: Widowed             What year were you married? 1952      Do you live in a house, apartment, assisted living, condo, trailer? Apartment      Is it one or more stories? 1      How many persons live in your home?      Do you have any pets in your home? 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 Yes           How often? Weekly and daily classes. Does walking on her own.      Do you have a living will? Yes      Do you have a DNR form?   Yes         If not, do you want to discuss one?      Do you have signed POA/HPOA forms? Yes               Functional Status Survey:    Family History  Problem Relation Age of Onset   Multiple myeloma Mother    Prostate cancer Father    Heart disease Father     Health Maintenance  Topic Date Due   TETANUS/TDAP  04/19/1947   INFLUENZA VACCINE  04/12/2019   DEXA SCAN  Completed   PNA vac Low Risk Adult  Completed    No Known Allergies  Allergies as of 06/19/2019   No Known Allergies     Medication List       Accurate as of June 19, 2019 11:22 AM. If you have any questions, ask your nurse or doctor.        STOP taking  these  medications   acetaminophen 325 MG tablet Commonly known as: TYLENOL Stopped by: [name], MD   oxyCODONE 5 MG immediate release tablet Commonly known as: Oxy IR/ROXICODONE Stopped by: [name], MD     TAKE these medications   aspirin EC 81 MG tablet Take 1 tablet (81 mg total) by mouth daily.   calcium-vitamin D 500-200 MG-UNIT tablet Commonly known as: Oscal 500/200 D-3 Take 1 tablet by mouth 2 (two) times daily.   donepezil 5 MG tablet Commonly known as: ARICEPT Take 1 tablet (5 mg total) by mouth at bedtime.   escitalopram 10 MG tablet Commonly known as: LEXAPRO Take 10 mg by mouth daily.   levothyroxine 100 MCG tablet Commonly known as: SYNTHROID Take 100 mcg by mouth daily before breakfast.   QUEtiapine 25 MG tablet Commonly known as: SEROQUEL Take 12.5 mg by mouth at bedtime.       Review of Systems  Constitutional: Positive for activity change.  HENT: Negative.   Respiratory: Negative.   Cardiovascular: Negative.   Gastrointestinal: Negative.   Genitourinary: Negative.   Musculoskeletal: Negative.   Neurological: Negative.   Psychiatric/Behavioral: Positive for behavioral problems, confusion and dysphoric mood.  All other systems reviewed and are negative.   Vitals:   06/19/19 1119  BP: 130/60  Pulse: 100  Resp: 20  Temp: (!) 97.3 F (36.3 C)  SpO2: 90%  Weight: 164 lb 6.4 oz (74.6 kg)  Height: 5' 2" (1.575 m)   Body mass index is 30.07 kg/m. Physical Exam Vitals signs reviewed.  Constitutional:      Appearance: Normal appearance.  HENT:     Head: Normocephalic.     Nose: Nose normal.     Mouth/Throat:     Mouth: Mucous membranes are moist.     Pharynx: Oropharynx is clear.  Eyes:     Pupils: Pupils are equal, round, and reactive to light.  Neck:     Musculoskeletal: Neck supple.  Cardiovascular:     Rate and Rhythm: Normal rate and regular rhythm.     Pulses: Normal pulses.  Pulmonary:     Effort: Pulmonary  effort is normal.     Comments: Fine crackles in Left Lower Lung Abdominal:     General: Abdomen is flat. Bowel sounds are normal.     Palpations: Abdomen is soft.  Musculoskeletal:        General: No swelling.  Skin:    General: Skin is warm and dry.  Neurological:     General: No focal deficit present.     Mental Status: She is alert.     Comments: Not oriented. Gets very Anxious  Psychiatric:     Comments: Anxious Confused     Labs reviewed: Basic Metabolic Panel: Recent Labs    04/15/19 04/16/19 05/29/19 06/14/19 2225  NA 134*  --  135* 132*  K 4.3  --  4.1 4.2  CL  --  102 102 100  CO2  --  _0 GLUCOSE  --   --   --  119*  BUN 13  --  13 16  CREATININE 0.7  --  0.7 0.55  CALCIUM  --  8.8 8.8 9.4   Liver Function Tests: Recent Labs    11/20/18 04/15/19 04/16/19  AST 19 14  --   ALT 12 11  --   ALKPHOS 59 55  --   PROT  --   --  5.7  ALBUMIN 3.7  --  3.4   No results for input(s): LIPASE, AMYLASE in the last 8760 hours. No results for input(s): AMMONIA in the last 8760 hours. CBC: Recent Labs    04/15/19 06/14/19 2225 06/17/19 0325  WBC 7.7 15.1* 10.2  NEUTROABS  --  11.3*  --   HGB 14.7 13.9 14.5  HCT 43 41.4 41.4  MCV  --  98.6 97.9  PLT 190 183 162   Cardiac Enzymes: No results for input(s): CKTOTAL, CKMB, CKMBINDEX, TROPONINI in the last 8760 hours. BNP: Invalid input(s): POCBNP Lab Results  Component Value Date   HGBA1C 5.7 06/18/2018   Lab Results  Component Value Date   TSH 2.13 04/15/2019   Lab Results  Component Value Date   VITAMINB12 352 10/28/2014   No results found for: FOLATE No results found for: IRON, TIBC, FERRITIN  Imaging and Procedures obtained prior to SNF admission: Dg Chest 2 View  Result Date: 06/14/2019 CLINICAL DATA:  Chest pain EXAM: CHEST - 2 VIEW COMPARISON:  None. FINDINGS: There is cardiomegaly. Mildly increased interstitial markings seen throughout both lungs. A small left pleural effusion is seen.  There is a minimally displaced posterior to left 6 rib fracture. Overlying subcutaneous emphysema seen in the chest wall. IMPRESSION: Cardiomegaly and interstitial edema. Small left pleural effusion Minimally displaced posterior left 6 rib fracture. Subcutaneous emphysema overlying the left chest wall. Electronically Signed   By: Prudencio Pair M.D.   On: 06/14/2019 21:13   Ct Chest Wo Contrast  Result Date: 06/14/2019 CLINICAL DATA:  85 year old female with history of trauma from a fall this morning. EXAM: CT CHEST WITHOUT CONTRAST TECHNIQUE: Multidetector CT imaging of the chest was performed following the standard protocol without IV contrast. COMPARISON:  No priors. FINDINGS: Cardiovascular: Heart size is normal. There is no significant pericardial fluid, thickening or pericardial calcification. There is aortic atherosclerosis, as well as atherosclerosis of the great vessels of the mediastinum and the coronary arteries, including calcified atherosclerotic plaque in the left anterior descending and right coronary arteries. Severe calcifications of the mitral annulus. Dilatation of the pulmonic trunk (4 cm in diameter), concerning for associated pulmonary arterial hypertension. Mediastinum/Nodes: No pathologically enlarged mediastinal or hilar lymph nodes. Please note that accurate exclusion of hilar adenopathy is limited on noncontrast CT scans. Esophagus is unremarkable in appearance. No axillary lymphadenopathy. Lungs/Pleura: Small left pneumothorax. Small volume of low-intermediate attenuation fluid lying dependently in the left hemithorax, compatible with a small effusion at is partially hemorrhagic. Some associated passive atelectasis in portions of the left lung, both in the basal segments of the left lower lobe and inferior segment of the lingula. Some dependent subsegmental atelectasis in the basal segments of the right lower lobe. Remaining portions of the right lung are otherwise clear. No right  pleural effusion. Upper Abdomen: Aortic atherosclerosis. Musculoskeletal: Multiple left-sided rib fractures are noted, as follows. Posterior left third rib fracture and anterolateral left third rib fracture, both of which are mildly displaced. Posterior left fourth rib fracture, nondisplaced and anterolateral left fourth rib fracture mildly comminuted and displaced. Posterior left fifth rib fracture mildly displaced and lateral left fifth rib fracture mildly comminuted and displaced. Posterior left sixth rib fracture nondisplaced, and lateral left sixth rib fracture mildly comminuted and displaced. Extensive subcutaneous emphysema in the left chest wall tracking both caudally and cephalad. IMPRESSION: 1. Multiple segmental left-sided rib fractures with small left hemopneumothorax and extensive subcutaneous emphysema in the left chest wall. 2. Dilatation of the pulmonic trunk (4 cm in diameter), concerning for  pulmonary arterial hypertension. 3. Aortic atherosclerosis, in addition to 2 vessel coronary artery disease. Critical Value/emergent results were called by telephone at the time of interpretation on 06/14/2019 at 10:24 pm to provider Blanchie Dessert MD, who verbally acknowledged these results. Aortic Atherosclerosis (ICD10-I70.0). Electronically Signed   By: Vinnie Langton M.D.   On: 06/14/2019 22:24   Dg Chest Port 1 View  Result Date: 06/15/2019 CLINICAL DATA:  Follow-up small left hemopneumothorax EXAM: PORTABLE CHEST 1 VIEW COMPARISON:  Chest radiograph from one day prior. FINDINGS: Stable cardiomediastinal silhouette with moderate cardiomegaly. Small left apical pneumothorax is stable, less than 5%. No right pneumothorax. No pleural effusion. Mild pulmonary edema. Patchy opacity in retrocardiac lower left lung and in the peripheral left lung, not appreciably changed. Redemonstrated multiple lateral left rib fractures with subcutaneous emphysema throughout the lateral left chest wall. IMPRESSION: 1.  Stable small left apical pneumothorax. 2. Stable moderate cardiomegaly with mild pulmonary edema. 3. Patchy retrocardiac lower left lung and peripheral left lung opacity, unchanged, favor a combination of atelectasis and contusion. Electronically Signed   By: Ilona Sorrel M.D.   On: 06/15/2019 10:11    Assessment/Plan  Acute Depression with Psychosis Discussed with the patient's daughter Patient had done this before.  She gets comfortable when she goes back to her room I discussed with therapy and DON But unfortunately due to Covid restrictions patient has to  in this new room for another 2 weeks. She was Covid negative  in the hospital We will start her back on Seroquel 12.5 mg twice daily She is on Lexapro and Aricept Per therapy she is doing well with her ADLS. Discontinue Oxycodone. Just tylenol for Pain control  Left Rib Fractures With Apical Pneumothorax Patient stable with no SOB or Pain Continue to monitor Hypothyroidism TSH normal in 8/20 Dementia On Low dose of Aricept Hyponatremia Will repeat Bmp and CBC in 1 week Labs were Normal in the hospital   Family/ staff Communication: With her daughter  Labs/tests ordered: BMP and CBC in 1 week   Total time spent in this patient care encounter was  45_  minutes; greater than 50% of the visit spent counseling patient and staff, reviewing records , Labs and coordinating care for problems addressed at this encounter.

## 2019-06-26 ENCOUNTER — Non-Acute Institutional Stay (SKILLED_NURSING_FACILITY): Payer: Medicare Other | Admitting: Internal Medicine

## 2019-06-26 ENCOUNTER — Encounter: Payer: Self-pay | Admitting: Internal Medicine

## 2019-06-26 DIAGNOSIS — F333 Major depressive disorder, recurrent, severe with psychotic symptoms: Secondary | ICD-10-CM

## 2019-06-26 DIAGNOSIS — S2242XA Multiple fractures of ribs, left side, initial encounter for closed fracture: Secondary | ICD-10-CM

## 2019-06-26 DIAGNOSIS — E039 Hypothyroidism, unspecified: Secondary | ICD-10-CM | POA: Diagnosis not present

## 2019-06-26 DIAGNOSIS — R296 Repeated falls: Secondary | ICD-10-CM | POA: Diagnosis not present

## 2019-06-26 DIAGNOSIS — S270XXA Traumatic pneumothorax, initial encounter: Secondary | ICD-10-CM | POA: Diagnosis not present

## 2019-06-26 DIAGNOSIS — E871 Hypo-osmolality and hyponatremia: Secondary | ICD-10-CM

## 2019-06-26 DIAGNOSIS — J939 Pneumothorax, unspecified: Secondary | ICD-10-CM | POA: Diagnosis not present

## 2019-06-26 DIAGNOSIS — F0390 Unspecified dementia without behavioral disturbance: Secondary | ICD-10-CM | POA: Diagnosis not present

## 2019-06-26 LAB — HEPATIC FUNCTION PANEL
ALT: 13 (ref 7–35)
AST: 14 (ref 13–35)
Alkaline Phosphatase: 61 (ref 25–125)
Bilirubin, Total: 0.7

## 2019-06-26 LAB — BASIC METABOLIC PANEL
BUN: 10 (ref 4–21)
Creatinine: 0.6 (ref 0.5–1.1)
Glucose: 89
Potassium: 4 (ref 3.4–5.3)
Sodium: 136 — AB (ref 137–147)

## 2019-06-26 LAB — CBC AND DIFFERENTIAL
HCT: 38 (ref 36–46)
Hemoglobin: 13 (ref 12.0–16.0)
Neutrophils Absolute: 3781
Platelets: 233 (ref 150–399)
WBC: 7.4

## 2019-06-26 NOTE — Progress Notes (Signed)
Location:    [facility] Room Number: [id number] Place of Service:  SNF (31)  Provider: [name], MD  PCP: [name], MD Patient Care Team: [name], MD as PCP - General (Internal Medicine) Mast, Man X, NP as Nurse Practitioner (Internal Medicine)  Extended Emergency Contact Information Primary Emergency Contact: Triggs,[name] Address: [address]          [place], [place] [address] [name] of [place] Phone: [phone] Mobile Phone: [phone] Relation: Son Secondary Emergency Contact: Tidwell,Brett Address: [address]          [place], [place] [address] [name] of [place] Phone: [phone] Work Phone: [phone] Mobile Phone: [phone] Relation: Son  Code Status: DNR Goals of care:  Advanced Directive information Advanced Directives 06/26/2019  Does Patient Have a Medical Advance Directive? Yes  Type of Advance Directive Out of facility DNR (pink MOST or yellow form)  Does patient want to make changes to medical advance directive? No - Patient declined  Copy of [place] in Chart? -  Pre-existing out of facility DNR order (yellow form or pink MOST form) -     No Known Allergies  Chief Complaint  Patient presents with  . Discharge Note    HPI:  85 y.o. female Seen today for Discharge from the SNF to AL back to her room. She will stay under our care there She was admitted in the hospital from 10/03-10/07 for Left Rib fractures with small apical pneumothorax Patient has h/ohypothyroidism, dementia,, sinus bradycardia, prediabetes, hyponatremia and recently got diagnosed with depression with Psychosis  Patient had a mechanical fall in the facility.  Usually she is pretty stable and walks with a walker.  She was sent to the hospital where she was found to have left apical pneumothorax less then 5% , multiple  left rib fractures and subcutaneous emphysema. She was admitted for monitoring.  Fortunately she did not require any surgical intervention.  She is denied any shortness of breath and her pain was controlled.   She was admitted to SNF therapy and Covid Monitoring Patient stayed independent. Had no falls. Was able to do well with her walker. She did have episodes of Psychosis including Moaning. Crying. Trying to Call her family and telling them that we are keeping her locked in a room. She was started on Low dose of Seroquel PRN. Patient responded to it but continued with Behavior issues. It was decided that she wold be safer in her own Room and today she will be discharged back to her AL room She did not have any complains including SOB or Chest Pain Was happy and seemed in her baseline  Past Medical History:  Diagnosis Date  . Eczema   . Hypothyroid   . Hypothyroidism   . Hypothyroidism   . Vitamin D deficiency     Past Surgical History:  Procedure Laterality Date  . CHOLECYSTECTOMY N/A 06/24/2013   Procedure: LAPAROSCOPIC CHOLECYSTECTOMY WITH INTRAOPERATIVE CHOLANGIOGRAM;  Surgeon: Ralene Ok, MD;  Location: [facility];  Service: General;  Laterality: N/A;  . TONSILLECTOMY        reports that she has never smoked. She has never used smokeless tobacco. She reports that she does not drink alcohol or use drugs. Social History   Socioeconomic History  . Marital status: Widowed    Spouse name: Not on file  . Number of children: 3  . Years of education: College  . Highest education level: Not on file  Occupational History  . Occupation: Retired  Scientific laboratory technician  . Financial resource strain: Not hard at all  . Food insecurity    Worry: Never true    Inability: Never true  . Transportation needs    Medical: No    Non-medical: No  Tobacco Use  . Smoking status: Never Smoker  . Smokeless tobacco: Never Used  Substance and Sexual Activity  . Alcohol use: No    Alcohol/week: 0.0 standard drinks  . Drug use: No  . Sexual activity: Not on file  Lifestyle  .  Physical activity    Days per week: 7 days    Minutes per session: 30 min  . Stress: Not at all  Relationships  . Social connections    Talks on phone: More than three times a week    Gets together: More than three times a week    Attends religious service: Never    Active member of club or organization: No    Attends meetings of clubs or organizations: Never    Relationship status: Widowed  . Intimate partner violence    Fear of current or ex partner: No    Emotionally abused: No    Physically abused: No    Forced sexual activity: No  Other Topics Concern  . Not on file  Social History Narrative   Tobacco use, amount per day now: None      Past tobacco use, amount per day: None      How many years did you use tobacco: Never      Alcohol use (drinks per week): None      Diet: 2 meals a day at [facility]      Do you drink/eat things with caffeine? Coffee      Marital status: Widowed             What year were you married? 1952      Do you live in a house, apartment, assisted living, condo, trailer? Apartment      Is it one or more stories? 1      How many persons live in your home?      Do you have any pets in your home? 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 Yes           How often? Weekly and daily classes. Does walking on her own.      Do you have a living will? Yes      Do you have a DNR form?   Yes         If not, do you want to discuss one?      Do you have signed POA/HPOA forms? Yes              Functional Status Survey:    No Known Allergies  Pertinent  Health Maintenance Due  Topic Date Due  . INFLUENZA VACCINE  04/12/2019  . DEXA SCAN  Completed  . PNA vac Low Risk Adult  Completed    Medications: Allergies as of 06/26/2019   No Known Allergies     Medication List       Accurate as of June 26, 2019  9:49 AM. If you have any questions, ask your nurse or doctor.         acetaminophen 325 MG tablet Commonly known as: TYLENOL Take 650 mg by mouth every 6 (six)  hours as needed.   aspirin EC 81 MG tablet Take 1 tablet (81 mg total) by mouth daily.   calcium-vitamin D 500-200 MG-UNIT tablet Commonly known as: OSCAL WITH D Take 1 tablet by mouth. Once daily What changed: Another medication with the same name was removed. Continue taking this medication, and follow the directions you see here. Changed by: [name], MD   donepezil 5 MG tablet Commonly known as: ARICEPT Take 1 tablet (5 mg total) by mouth at bedtime.   escitalopram 10 MG tablet Commonly known as: LEXAPRO Take 10 mg by mouth daily.   levothyroxine 100 MCG tablet Commonly known as: SYNTHROID Take 100 mcg by mouth daily before breakfast.   QUEtiapine 25 MG tablet Commonly known as: SEROQUEL Take 25 mg by mouth 2 (two) times daily. As needed       Review of Systems  Vitals:   06/26/19 0944  BP: (!) 150/68  Pulse: 60  Resp: 18  Temp: (!) 97.3 F (36.3 C)  SpO2: 92%  Weight: 165 lb (74.8 kg)  Height: 5\' 2"  (1.575 m)   Body mass index is 30.18 kg/m. Physical Exam  Constitutional:Well-developed and well-nourished.  HENT:  Head: Normocephalic.  Mouth/Throat: Oropharynx is clear and moist.  Eyes: Pupils are equal, round, and reactive to light.  Neck: Neck supple.  Cardiovascular: Normal rate and normal heart sounds.  No murmur heard. Pulmonary/Chest: Effort normal . No respiratory distress. No wheezes. She has no rales.  Continues to have fine crackles in Left Lower Lung Abdominal: Soft. Bowel sounds are normal. No distension. There is no tenderness. There is no rebound.  Musculoskeletal: No edema.  Lymphadenopathy: none Neurological: NO focal Deficits. Walks with the walker Skin: Skin is warm and dry.  Psychiatric: Normal mood and affect. Behavior is normal. Thought content normal.    Labs reviewed: Basic Metabolic Panel: Recent Labs    04/15/19 04/16/19  05/29/19 06/14/19 2225  NA 134*  --  135* 132*  K 4.3  --  4.1 4.2  CL  --  102 102 100  CO2  --  25 25 24   GLUCOSE  --   --   --  119*  BUN 13  --  13 16  CREATININE 0.7  --  0.7 0.55  CALCIUM  --  8.8 8.8 9.4   Liver Function Tests: Recent Labs    11/20/18 04/15/19 04/16/19  AST 19 14  --   ALT 12 11  --   ALKPHOS 59 55  --   PROT  --   --  5.7  ALBUMIN 3.7  --  3.4   No results for input(s): LIPASE, AMYLASE in the last 8760 hours. No results for input(s): AMMONIA in the last 8760 hours. CBC: Recent Labs    04/15/19 06/14/19 2225 06/17/19 0325  WBC 7.7 15.1* 10.2  NEUTROABS  --  11.3*  --   HGB 14.7 13.9 14.5  HCT 43 41.4 41.4  MCV  --  98.6 97.9  PLT 190 183 162   Cardiac Enzymes: No results for input(s): CKTOTAL, CKMB, CKMBINDEX, TROPONINI in the last 8760 hours. BNP: Invalid input(s): POCBNP CBG: No results for input(s): GLUCAP in the last 8760 hours.  Procedures and Imaging Studies During Stay: Dg Chest 2 View  Result Date: 06/14/2019 CLINICAL DATA:  Chest pain EXAM: CHEST - 2 VIEW COMPARISON:  None. FINDINGS: There is cardiomegaly. Mildly increased interstitial markings seen throughout both lungs. A small left pleural effusion is seen. There is  a minimally displaced posterior to left 6 rib fracture. Overlying subcutaneous emphysema seen in the chest wall. IMPRESSION: Cardiomegaly and interstitial edema. Small left pleural effusion Minimally displaced posterior left 6 rib fracture. Subcutaneous emphysema overlying the left chest wall. Electronically Signed   By: Prudencio Pair M.D.   On: 06/14/2019 21:13   Ct Chest Wo Contrast  Result Date: 06/14/2019 CLINICAL DATA:  85 year old female with history of trauma from a fall this morning. EXAM: CT CHEST WITHOUT CONTRAST TECHNIQUE: Multidetector CT imaging of the chest was performed following the standard protocol without IV contrast. COMPARISON:  No priors. FINDINGS: Cardiovascular: Heart size is normal. There is no  significant pericardial fluid, thickening or pericardial calcification. There is aortic atherosclerosis, as well as atherosclerosis of the great vessels of the mediastinum and the coronary arteries, including calcified atherosclerotic plaque in the left anterior descending and right coronary arteries. Severe calcifications of the mitral annulus. Dilatation of the pulmonic trunk (4 cm in diameter), concerning for associated pulmonary arterial hypertension. Mediastinum/Nodes: No pathologically enlarged mediastinal or hilar lymph nodes. Please note that accurate exclusion of hilar adenopathy is limited on noncontrast CT scans. Esophagus is unremarkable in appearance. No axillary lymphadenopathy. Lungs/Pleura: Small left pneumothorax. Small volume of low-intermediate attenuation fluid lying dependently in the left hemithorax, compatible with a small effusion at is partially hemorrhagic. Some associated passive atelectasis in portions of the left lung, both in the basal segments of the left lower lobe and inferior segment of the lingula. Some dependent subsegmental atelectasis in the basal segments of the right lower lobe. Remaining portions of the right lung are otherwise clear. No right pleural effusion. Upper Abdomen: Aortic atherosclerosis. Musculoskeletal: Multiple left-sided rib fractures are noted, as follows. Posterior left third rib fracture and anterolateral left third rib fracture, both of which are mildly displaced. Posterior left fourth rib fracture, nondisplaced and anterolateral left fourth rib fracture mildly comminuted and displaced. Posterior left fifth rib fracture mildly displaced and lateral left fifth rib fracture mildly comminuted and displaced. Posterior left sixth rib fracture nondisplaced, and lateral left sixth rib fracture mildly comminuted and displaced. Extensive subcutaneous emphysema in the left chest wall tracking both caudally and cephalad. IMPRESSION: 1. Multiple segmental left-sided  rib fractures with small left hemopneumothorax and extensive subcutaneous emphysema in the left chest wall. 2. Dilatation of the pulmonic trunk (4 cm in diameter), concerning for pulmonary arterial hypertension. 3. Aortic atherosclerosis, in addition to 2 vessel coronary artery disease. Critical Value/emergent results were called by telephone at the time of interpretation on 06/14/2019 at 10:24 pm to provider Blanchie Dessert MD, who verbally acknowledged these results. Aortic Atherosclerosis (ICD10-I70.0). Electronically Signed   By: Vinnie Langton M.D.   On: 06/14/2019 22:24   Dg Chest Port 1 View  Result Date: 06/16/2019 CLINICAL DATA:  Follow-up left pneumothorax EXAM: PORTABLE CHEST 1 VIEW COMPARISON:  06/15/2019 FINDINGS: Stable small left apical pneumothorax is noted. Considerable left-sided subcutaneous emphysema is again noted and stable. Left rib fractures are again seen and stable improved aeration in the left lung is noted when compare with the prior exam. Right lung remains clear. Cardiac shadow remains mildly enlarged with aortic calcifications identified. IMPRESSION: Improving aeration particularly on the left. Small left apical pneumothorax remains. Electronically Signed   By: Inez Catalina M.D.   On: 06/16/2019 07:58   Dg Chest Port 1 View  Result Date: 06/15/2019 CLINICAL DATA:  Follow-up small left hemopneumothorax EXAM: PORTABLE CHEST 1 VIEW COMPARISON:  Chest radiograph from one day prior. FINDINGS: Stable  cardiomediastinal silhouette with moderate cardiomegaly. Small left apical pneumothorax is stable, less than 5%. No right pneumothorax. No pleural effusion. Mild pulmonary edema. Patchy opacity in retrocardiac lower left lung and in the peripheral left lung, not appreciably changed. Redemonstrated multiple lateral left rib fractures with subcutaneous emphysema throughout the lateral left chest wall. IMPRESSION: 1. Stable small left apical pneumothorax. 2. Stable moderate cardiomegaly  with mild pulmonary edema. 3. Patchy retrocardiac lower left lung and peripheral left lung opacity, unchanged, favor a combination of atelectasis and contusion. Electronically Signed   By: Ilona Sorrel M.D.   On: 06/15/2019 10:11    Assessment/Plan:    Depression, major, recurrent, severe with psychosis ([place]) Will continue Seroquel Low dose PRN On Lexapro. Daughter does not want me to change to the dose of Lexapro. Continue Aricept I think going back to her room she will calm down and we can take her off Seroquel  Hyponatremia BMP has been stable  Closed traumatic fracture of ribs of left side with pneumothorax Continues to be asymptomatic Will Follow Dementia Indepenent in her ADLS Walks with walker On Aricept Acquired hypothyroidism TSh Normal in 8/20  Future labs/tests needed:

## 2019-07-16 ENCOUNTER — Encounter: Payer: Self-pay | Admitting: Nurse Practitioner

## 2019-07-16 ENCOUNTER — Non-Acute Institutional Stay: Payer: Medicare Other | Admitting: Nurse Practitioner

## 2019-07-16 DIAGNOSIS — E039 Hypothyroidism, unspecified: Secondary | ICD-10-CM | POA: Diagnosis not present

## 2019-07-16 DIAGNOSIS — F0150 Vascular dementia without behavioral disturbance: Secondary | ICD-10-CM | POA: Diagnosis not present

## 2019-07-16 DIAGNOSIS — F418 Other specified anxiety disorders: Secondary | ICD-10-CM | POA: Diagnosis not present

## 2019-07-16 LAB — CHLORIDE
Albumin: 3.2
Calcium: 8.6
Carbon Dioxide, Total: 25
Chloride: 104
Globulin: 2.1
Total Protein: 5.3 — AB (ref 6.4–8.2)

## 2019-07-16 NOTE — Progress Notes (Signed)
This encounter was created in error - please disregard.

## 2019-07-16 NOTE — Progress Notes (Signed)
Location:   [facility] Room Number: [id number] Place of Service:  ALF (13) Provider:  Rebeccah Ivins NP  [name], MD  Patient Care Team: [name], MD as PCP - General (Internal Medicine) Ameshia Pewitt X, NP as Nurse Practitioner (Internal Medicine)  Extended Emergency Contact Information Primary Emergency Contact: Tortorella,[name] Address: [address]          [place], [place] [address] [name] of [place] Phone: [phone] Mobile Phone: [phone] Relation: Son Secondary Emergency Contact: Jablon,Brett Address: [address]          [place], [place] [address] [name] of [place] Phone: [phone] Work Phone: [phone] Mobile Phone: [phone] Relation: Son  Code Status:  DNR Goals of care: Advanced Directive information Advanced Directives 06/26/2019  Does Patient Have a Medical Advance Directive? Yes  Type of Advance Directive Out of facility DNR (pink MOST or yellow form)  Does patient want to make changes to medical advance directive? No - Patient declined  Copy of [place] in Chart? -  Pre-existing out of facility DNR order (yellow form or pink MOST form) -     Chief Complaint  Patient presents with  . Acute Visit    Behavior issues, possible renew Seroquel    HPI:  Pt is a 85 y.o. female seen today for an acute visit for the patient was reported talking loud or making noise to herself. The patient stated she got used to do so since she has been alone for so long. One nurse who know her behaviors since the patient was in [place]. The patient stated she sleeps/eats at her baseline, her behaviors is a part her usual stated of health, her weight is stable in the past month. CBC CMP 06/26/19 unremarkable. The patient resides in [facility] for safety, care assistance, on Donepezil 5mg  qd for memory. Her mood is managed with Escitalopram 10mg  qd. Hx of Hypothyroidism, on Levothyroxine 146mcg qd, last TSH 2.13 04/15/19    Past Medical History:  Diagnosis Date  . Eczema   . Hypothyroid   . Hypothyroidism   . Hypothyroidism   . Vitamin D deficiency    Past Surgical History:  Procedure Laterality Date  . CHOLECYSTECTOMY N/A 06/24/2013   Procedure: LAPAROSCOPIC CHOLECYSTECTOMY WITH INTRAOPERATIVE CHOLANGIOGRAM;  Surgeon: Ralene Ok, MD;  Location: [facility];  Service: General;  Laterality: N/A;  . TONSILLECTOMY      No Known Allergies  Allergies as of 07/16/2019   No Known Allergies     Medication List       Accurate as of July 16, 2019 11:59 PM. If you have any questions, ask your nurse or doctor.        STOP taking these medications   acetaminophen 325 MG tablet Commonly known as: TYLENOL Stopped by: Shadoe Bethel X Kanoa Phillippi, NP     TAKE these medications   aspirin EC 81 MG tablet Take 1 tablet (81 mg total) by mouth daily.   calcium-vitamin D 500-200 MG-UNIT tablet Commonly known as: OSCAL WITH D Take 1 tablet by mouth 2 (two) times daily. Once daily   donepezil 5 MG tablet Commonly known as: ARICEPT Take 1 tablet (5 mg total) by mouth at bedtime.   escitalopram 10 MG tablet Commonly known as: LEXAPRO Take 10 mg by mouth daily.   levothyroxine 100 MCG tablet Commonly known as: SYNTHROID Take 100 mcg by mouth daily before breakfast.      ROS was provided with  assistance of staff.  Review of Systems  Constitutional: Negative for activity change, appetite change, chills, diaphoresis, fatigue, fever and unexpected weight change.  HENT: Positive for hearing loss. Negative for congestion and voice change.   Respiratory: Negative for cough, shortness of breath and wheezing.   Cardiovascular: Negative for chest pain, palpitations and leg swelling.  Gastrointestinal: Negative for abdominal distention, abdominal pain, constipation, diarrhea, nausea and vomiting.  Genitourinary: Negative for difficulty urinating, dysuria and urgency.  Musculoskeletal: Positive for gait problem.  Skin:  Negative for color change and pallor.  Neurological: Negative for dizziness, speech difficulty, weakness and headaches.       Dementia  Psychiatric/Behavioral: Positive for confusion. Negative for agitation, behavioral problems, hallucinations and sleep disturbance. The patient is not nervous/anxious.        Talking out or making noise loud to herself sometimes.     Immunization History  Administered Date(s) Administered  . Influenza Whole 06/15/2018  . Influenza,inj,Quad PF,6+ Mos 06/25/2013  . Pneumococcal Conjugate-13 03/11/2014  . Pneumococcal Polysaccharide-23 09/11/1986, 09/11/2001, 06/25/2013  . Zoster Recombinat (Shingrix) 09/12/2007   Pertinent  Health Maintenance Due  Topic Date Due  . INFLUENZA VACCINE  04/12/2019  . DEXA SCAN  Completed  . PNA vac Low Risk Adult  Completed   Fall Risk  10/02/2017  Falls in the past year? Yes  Number falls in past yr: 1  Injury with Fall? No   Functional Status Survey:    Vitals:   07/16/19 0814  BP: 124/70  Pulse: 78  Resp: 18  Temp: (!) 97.2 F (36.2 C)  SpO2: 94%  Weight: 164 lb 6.4 oz (74.6 kg)  Height: 5\' 2"  (1.575 m)   Body mass index is 30.07 kg/m. Physical Exam Vitals signs and nursing note reviewed.  Constitutional:      General: She is not in acute distress.    Appearance: Normal appearance. She is not ill-appearing, toxic-appearing or diaphoretic.  HENT:     Head: Normocephalic and atraumatic.     Nose: Nose normal.     Mouth/Throat:     Mouth: Mucous membranes are moist.  Eyes:     Extraocular Movements: Extraocular movements intact.     Conjunctiva/sclera: Conjunctivae normal.     Pupils: Pupils are equal, round, and reactive to light.  Neck:     Musculoskeletal: Normal range of motion and neck supple.  Cardiovascular:     Rate and Rhythm: Normal rate and regular rhythm.     Heart sounds: No murmur.  Pulmonary:     Breath sounds: Rales present. No wheezing or rhonchi.     Comments: Bibasilar rales.   Abdominal:     General: Bowel sounds are normal. There is no distension.     Palpations: Abdomen is soft.     Tenderness: There is no abdominal tenderness. There is no right CVA tenderness, left CVA tenderness, guarding or rebound.  Musculoskeletal:     Right lower leg: No edema.     Left lower leg: No edema.  Skin:    General: Skin is warm and dry.  Neurological:     General: No focal deficit present.     Mental Status: She is alert. Mental status is at baseline.     Motor: No weakness.     Coordination: Coordination normal.     Gait: Gait abnormal.     Comments: Oriented to person, place.   Psychiatric:        Mood and Affect: Mood normal.  Behavior: Behavior normal.     Labs reviewed: Recent Labs    05/29/19 06/14/19 2225 06/26/19  NA 135* 132* 136*  K 4.1 4.2 4.0  CL 102 100 104  CO2 25 24 25   GLUCOSE  --  119*  --   BUN 13 16 10   CREATININE 0.7 0.55 0.6  CALCIUM 8.8 9.4 8.6   Recent Labs    11/20/18 04/15/19 04/16/19 06/26/19  AST 19 14  --  14  ALT 12 11  --  13  ALKPHOS 59 55  --  61  PROT  --   --  5.7 5.3*  ALBUMIN 3.7  --  3.4 3.2   Recent Labs    06/14/19 2225 06/17/19 0325 06/26/19  WBC 15.1* 10.2 7.4  NEUTROABS 11.3*  --  3,781  HGB 13.9 14.5 13.0  HCT 41.4 41.4 38  MCV 98.6 97.9  --   PLT 183 162 233   Lab Results  Component Value Date   TSH 2.13 04/15/2019   Lab Results  Component Value Date   HGBA1C 5.7 06/18/2018   Lab Results  Component Value Date   CHOL 188 01/30/2018   HDL 64 01/30/2018   LDLCALC 106 (H) 01/30/2018   TRIG 89 01/30/2018   CHOLHDL 2.9 01/30/2018    Significant Diagnostic Results in last 30 days:  No results found.  Assessment/Plan Depression with anxiety Related to dementia, her mood is stable, continue Escitalopram 10mg  qd. Her taking out loud or making noise to herself is her baseline/her way to express herself, no harm to self or others, no further antipsychotic meds is indicated presently.  Observe.   Dementia without behavioral disturbance ([place]) Taking out or making noise loud is her is her baseline, no harm to self or others presently, continue AL [facility] for safety, care assistance, continue Donepezil 5mg  qd observe.   Hypothyroidism Stable, last TSH wnl 2.13 04/15/19, continue Levothyroxine 127mcg qd.      Family/ staff Communication: plan of care reviewed with the patient and charge nurse.   Labs/tests ordered:  none  Time spend 40 minutes.

## 2019-07-17 ENCOUNTER — Encounter: Payer: Self-pay | Admitting: Nurse Practitioner

## 2019-07-17 NOTE — Assessment & Plan Note (Signed)
Stable, last TSH wnl 2.13 04/15/19, continue Levothyroxine 135mcg qd.

## 2019-07-17 NOTE — Assessment & Plan Note (Signed)
Related to dementia, her mood is stable, continue Escitalopram 10mg  qd. Her taking out loud or making noise to herself is her baseline/her way to express herself, no harm to self or others, no further antipsychotic meds is indicated presently. Observe.

## 2019-07-17 NOTE — Assessment & Plan Note (Signed)
Taking out or making noise loud is her is her baseline, no harm to self or others presently, continue AL [facility] for safety, care assistance, continue Donepezil 5mg  qd observe.

## 2019-08-13 ENCOUNTER — Telehealth: Payer: Self-pay | Admitting: Nurse Practitioner

## 2019-08-13 ENCOUNTER — Non-Acute Institutional Stay: Payer: Medicare Other | Admitting: Nurse Practitioner

## 2019-08-13 ENCOUNTER — Encounter: Payer: Self-pay | Admitting: Nurse Practitioner

## 2019-08-13 DIAGNOSIS — M549 Dorsalgia, unspecified: Secondary | ICD-10-CM | POA: Diagnosis not present

## 2019-08-13 DIAGNOSIS — R05 Cough: Secondary | ICD-10-CM | POA: Diagnosis not present

## 2019-08-13 DIAGNOSIS — F0150 Vascular dementia without behavioral disturbance: Secondary | ICD-10-CM

## 2019-08-13 DIAGNOSIS — F339 Major depressive disorder, recurrent, unspecified: Secondary | ICD-10-CM

## 2019-08-13 NOTE — Progress Notes (Signed)
Location:   [facility] Room Number: [id number] Place of Service:  ALF (13) Provider: Lennie Odor Breiana Stratmann NP  [name], MD  Patient Care Team: [name], MD as PCP - General (Internal Medicine) Kendalynn Wideman X, NP as Nurse Practitioner (Internal Medicine)  Extended Emergency Contact Information Primary Emergency Contact: Hedgepeth,[name] Address: [address]          [place], [place] [address] [name] of [place] Phone: [phone] Mobile Phone: [phone] Relation: Son Secondary Emergency Contact: Riechers,Brett Address: [address]          [place], [place] [address] [name] of [place] Phone: [phone] Work Phone: [phone] Mobile Phone: [phone] Relation: Son  Code Status: DNR Goals of care: Advanced Directive information Advanced Directives 06/26/2019  Does Patient Have a Medical Advance Directive? Yes  Type of Advance Directive Out of facility DNR (pink MOST or yellow form)  Does patient want to make changes to medical advance directive? No - Patient declined  Copy of [place] in Chart? -  Pre-existing out of facility DNR order (yellow form or pink MOST form) -     Chief Complaint  Patient presents with  . Acute Visit    left mid back, left lower rib cage pain    HPI:  Pt is a 85 y.o. female seen today for an acute visit for staff reported 08/12/19 the patient stated can you help me and give me something for my back. Pain at posterior left lower rib cage/CVA region  was reproduced by cough and when it was pressed on, but not with body movement.  Its beginning bother me more and I can't ever et comfortable when I go to bed. The pain comes and goes, but recently more regular, prn Tylenol is available to her. The patient denied dysuria, abd pain, urinary frequency/urgency, she is afebrile. She denied cough, SOB, phlegm production.   Hx of dementia, resides in [facility] for safety, care assistance, on Donepezil 5mg  qd.  Her mood is stable, on Lexaprol 10mg  qd.    Past Medical History:  Diagnosis Date  . Eczema   . Hypothyroid   . Hypothyroidism   . Hypothyroidism   . Vitamin D deficiency    Past Surgical History:  Procedure Laterality Date  . CHOLECYSTECTOMY N/A 06/24/2013   Procedure: LAPAROSCOPIC CHOLECYSTECTOMY WITH INTRAOPERATIVE CHOLANGIOGRAM;  Surgeon: Ralene Ok, MD;  Location: [facility];  Service: General;  Laterality: N/A;  . TONSILLECTOMY      No Known Allergies  Allergies as of 08/13/2019   No Known Allergies     Medication List       Accurate as of August 13, 2019  2:04 PM. If you have any questions, ask your nurse or doctor.        aspirin EC 81 MG tablet Take 1 tablet (81 mg total) by mouth daily.   calcium-vitamin D 500-200 MG-UNIT tablet Commonly known as: OSCAL WITH D Take 1 tablet by mouth 2 (two) times daily. Once daily   donepezil 5 MG tablet Commonly known as: ARICEPT Take 1 tablet (5 mg total) by mouth at bedtime.   escitalopram 10 MG tablet Commonly known as: LEXAPRO Take 10 mg by mouth daily.   levothyroxine 100 MCG tablet Commonly known as: SYNTHROID Take 100 mcg by mouth daily before breakfast.      ROS was provided with assistance of staff.  Review of Systems  Constitutional: Negative for activity change, appetite change, chills, diaphoresis,  fatigue and fever.  HENT: Positive for hearing loss. Negative for congestion and voice change.   Eyes: Negative for visual disturbance.  Respiratory: Negative for cough, shortness of breath and wheezing.   Cardiovascular: Positive for chest pain. Negative for palpitations and leg swelling.       Posterior left lower rib cage   Gastrointestinal: Negative for abdominal distention, abdominal pain, constipation, diarrhea, nausea and vomiting.  Genitourinary: Negative for difficulty urinating, dysuria and urgency.  Musculoskeletal: Positive for arthralgias, back pain and gait problem.       Left mid back   Skin: Negative for color change and pallor.  Neurological: Negative for dizziness, speech difficulty, weakness and headaches.       Dementia  Psychiatric/Behavioral: Positive for confusion. Negative for agitation, behavioral problems, hallucinations and sleep disturbance. The patient is not nervous/anxious.     Immunization History  Administered Date(s) Administered  . Influenza Whole 06/15/2018  . Influenza,inj,Quad PF,6+ Mos 06/25/2013  . Pneumococcal Conjugate-13 03/11/2014  . Pneumococcal Polysaccharide-23 09/11/1986, 09/11/2001, 06/25/2013  . Zoster Recombinat (Shingrix) 09/12/2007   Pertinent  Health Maintenance Due  Topic Date Due  . INFLUENZA VACCINE  04/12/2019  . DEXA SCAN  Completed  . PNA vac Low Risk Adult  Completed   Fall Risk  04/11/2019 10/02/2017  Falls in the past year? 0 Yes  Comment Emmi Telephone Survey: data to providers prior to load -  Number falls in past yr: - 1  Injury with Fall? - No   Functional Status Survey:    Vitals:   08/13/19 1346  BP: 140/70  Pulse: 70  Resp: 20  Temp: 97.7 F (36.5 C)  SpO2: 91%   There is no height or weight on file to calculate BMI. Physical Exam Vitals signs and nursing note reviewed.  Constitutional:      General: She is not in acute distress.    Appearance: Normal appearance. She is not ill-appearing, toxic-appearing or diaphoretic.  HENT:     Head: Normocephalic and atraumatic.     Nose: Nose normal.     Mouth/Throat:     Mouth: Mucous membranes are moist.  Eyes:     Extraocular Movements: Extraocular movements intact.     Conjunctiva/sclera: Conjunctivae normal.     Pupils: Pupils are equal, round, and reactive to light.  Neck:     Musculoskeletal: Normal range of motion and neck supple.  Cardiovascular:     Rate and Rhythm: Normal rate and regular rhythm.     Heart sounds: No murmur.  Pulmonary:     Breath sounds: Rales present. No wheezing or rhonchi.     Comments: Bibasilar rales.  Abdominal:      General: Bowel sounds are normal. There is no distension.     Palpations: Abdomen is soft.     Tenderness: There is no abdominal tenderness. There is no right CVA tenderness, left CVA tenderness, guarding or rebound.  Musculoskeletal:     Right lower leg: No edema.     Left lower leg: No edema.  Skin:    General: Skin is warm and dry.  Neurological:     General: No focal deficit present.     Mental Status: She is alert. Mental status is at baseline.     Motor: No weakness.     Coordination: Coordination normal.     Gait: Gait abnormal.     Comments: Oriented to person, place.   Psychiatric:        Mood and Affect: Mood normal.  Behavior: Behavior normal.     Labs reviewed: Recent Labs    05/29/19 06/14/19 2225 06/26/19  NA 135* 132* 136*  K 4.1 4.2 4.0  CL 102 100 104  CO2 25 24 25   GLUCOSE  --  119*  --   BUN 13 16 10   CREATININE 0.7 0.55 0.6  CALCIUM 8.8 9.4 8.6   Recent Labs    11/20/18 04/15/19 04/16/19 06/26/19  AST 19 14  --  14  ALT 12 11  --  13  ALKPHOS 59 55  --  61  PROT  --   --  5.7 5.3*  ALBUMIN 3.7  --  3.4 3.2   Recent Labs    06/14/19 2225 06/17/19 0325 06/26/19  WBC 15.1* 10.2 7.4  NEUTROABS 11.3*  --  3,781  HGB 13.9 14.5 13.0  HCT 41.4 41.4 38  MCV 98.6 97.9  --   PLT 183 162 233   Lab Results  Component Value Date   TSH 2.13 04/15/2019   Lab Results  Component Value Date   HGBA1C 5.7 06/18/2018   Lab Results  Component Value Date   CHOL 188 01/30/2018   HDL 64 01/30/2018   LDLCALC 106 (H) 01/30/2018   TRIG 89 01/30/2018   CHOLHDL 2.9 01/30/2018    Significant Diagnostic Results in last 30 days:  No results found.  Assessment/Plan: Mid back pain on left side In the region of posterior left lower rib cage/CVA pain complained, reproduced with deep breathing/cough and percussion, not body movement. CXR ap/lateral, UA C/S to evaluate further. Tylenol 650mg  tid x 72hrs.   Dementia without behavioral disturbance  ([place]) Continue [facility] for safety, care assistance, continue Donepezil  Depression, recurrent ([place]) Stable, continue Lexapro 10mg  qd.     Family/ staff Communication: plan of care reviewed with the patient and charge nurse.   Labs/tests ordered:  UA C/S, CXR  Time spend 40 minutes.

## 2019-08-13 NOTE — Assessment & Plan Note (Addendum)
In the region of posterior left lower rib cage/CVA pain complained, reproduced with deep breathing/cough and percussion, not body movement. CXR ap/lateral, UA C/S to evaluate further. Tylenol 650mg  tid x 72hrs.

## 2019-08-13 NOTE — Telephone Encounter (Signed)
Chest xray results called tonight while on call which stated infiltrate in left lung base consistent with pneumonia. nurse reports pt had been complaining of pain in that region and that is why xray was obtained. No fever, shortness of breath, cough or congestion. VSS. Pt started on augmentin 875 mg PO BID for 7 days along with azithromycin 500 mg by mouth x1 day followed by 250 mg by mouth daily x 4 days per up to date guidelines for treatment of pneumonia. Pt without recent antibiotic use as well. Sent to [place] NP for FYI and for follow up.

## 2019-08-13 NOTE — Assessment & Plan Note (Signed)
Continue [facility] for safety, care assistance, continue Donepezil

## 2019-08-13 NOTE — Assessment & Plan Note (Signed)
Stable, continue Lexapro 10mg qd.  

## 2019-08-14 ENCOUNTER — Encounter: Payer: Self-pay | Admitting: Internal Medicine

## 2019-08-14 ENCOUNTER — Non-Acute Institutional Stay: Payer: Medicare Other | Admitting: Internal Medicine

## 2019-08-14 DIAGNOSIS — E039 Hypothyroidism, unspecified: Secondary | ICD-10-CM

## 2019-08-14 DIAGNOSIS — F339 Major depressive disorder, recurrent, unspecified: Secondary | ICD-10-CM

## 2019-08-14 DIAGNOSIS — J189 Pneumonia, unspecified organism: Secondary | ICD-10-CM | POA: Diagnosis not present

## 2019-08-14 DIAGNOSIS — F0150 Vascular dementia without behavioral disturbance: Secondary | ICD-10-CM | POA: Diagnosis not present

## 2019-08-14 NOTE — Progress Notes (Signed)
Location: [facility] of Service:  ALF (13)  Provider:   Code Status:  Goals of Care:  Advanced Directives 06/26/2019  Does Patient Have a Medical Advance Directive? Yes  Type of Advance Directive Out of facility DNR (pink MOST or yellow form)  Does patient want to make changes to medical advance directive? No - Patient declined  Copy of [place] in Chart? -  Pre-existing out of facility DNR order (yellow form or pink MOST form) -     Chief Complaint  Patient presents with  . Acute Visit    HPI: Patient is a 85 y.o. female seen today for an acute visit for Pneumonia Patient is resident of [place]  Patient has h/ohypothyroidism, dementia,, sinus bradycardia, prediabetes, hyponatremia and recently got diagnosed with depressionwith Psychosis. She was admitted in the hospital from 10/03-10/07 for Left Ribfractures with small apical pneumothorax  Patient was seen yesterday for left Mid back pain.  She had an x-ray done which showed left lower lobe infiltrate possible pneumonia.  No rib fractures are pneumothorax after infusion. Patient denies any cough shortness of breath or fever.  She states her pain is much better today.  She continues to walk with her walker and has not had any recent falls. Just feel Tired  Past Medical History:  Diagnosis Date  . Eczema   . Hypothyroid   . Hypothyroidism   . Hypothyroidism   . Vitamin D deficiency     Past Surgical History:  Procedure Laterality Date  . CHOLECYSTECTOMY N/A 06/24/2013   Procedure: LAPAROSCOPIC CHOLECYSTECTOMY WITH INTRAOPERATIVE CHOLANGIOGRAM;  Surgeon: Ralene Ok, MD;  Location: [facility];  Service: General;  Laterality: N/A;  . TONSILLECTOMY      No Known Allergies  Outpatient Encounter Medications as of 08/14/2019  Medication Sig  . aspirin EC 81 MG tablet Take 1 tablet (81 mg total) by mouth daily.  . calcium-vitamin D (OSCAL WITH D) 500-200 MG-UNIT tablet Take 1 tablet  by mouth 2 (two) times daily. Once daily   . donepezil (ARICEPT) 5 MG tablet Take 1 tablet (5 mg total) by mouth at bedtime.  Marland Kitchen escitalopram (LEXAPRO) 10 MG tablet Take 10 mg by mouth daily.  Marland Kitchen levothyroxine (SYNTHROID, LEVOTHROID) 100 MCG tablet Take 100 mcg by mouth daily before breakfast.   No facility-administered encounter medications on file as of 08/14/2019.     Review of Systems:  Review of Systems  Review of Systems  Constitutional: Negative for activity change, appetite change, chills, diaphoresis, fatigue and fever.  HENT: Negative for mouth sores, postnasal drip, rhinorrhea, sinus pain and sore throat.   Respiratory: Negative for apnea, cough, chest tightness, shortness of breath and wheezing.   Cardiovascular: Negative for chest pain, palpitations and leg swelling.  Gastrointestinal: Negative for abdominal distention, abdominal pain, constipation, diarrhea, nausea and vomiting.  Genitourinary: Negative for dysuria and frequency.  Musculoskeletal: Negative for arthralgias, joint swelling and myalgias.  Skin: Negative for rash.  Neurological: Negative for dizziness, syncope, weakness, light-headedness and numbness.  Psychiatric/Behavioral: Negative for behavioral problems, confusion and sleep disturbance.     Health Maintenance  Topic Date Due  . TETANUS/TDAP  04/19/1945  . INFLUENZA VACCINE  04/12/2019  . DEXA SCAN  Completed  . PNA vac Low Risk Adult  Completed    Physical Exam: Vitals:   08/14/19 1902  BP: 140/70  Pulse: 60  Resp: 12  Temp: (!) 97.3 F (36.3 C)  SpO2: 96%   There is no  height or weight on file to calculate BMI. Physical Exam  Constitutional: . Well-developed and well-nourished.  HENT:  Head: Normocephalic.  Mouth/Throat: Oropharynx is clear and moist.  Eyes: Pupils are equal, round, and reactive to light.  Neck: Neck supple.  Cardiovascular: Normal rate and normal heart sounds.  No murmur heard. Pulmonary/Chest: Effort normal and  breath sounds normal. No respiratory distress. No wheezes.Left Lower lobe has Rales Abdominal: Soft. Bowel sounds are normal. No distension. There is no tenderness. There is no rebound.  Musculoskeletal: No edema.  Lymphadenopathy: none Neurological:No Focal Deficit Skin: Skin is warm and dry.  Psychiatric: Normal mood and affect. Behavior is normal. Thought content normal.    Labs reviewed: Basic Metabolic Panel: Recent Labs    09/10/18 11/19/18  04/15/19  05/29/19 06/14/19 2225 06/26/19  NA  --   --    < > 134*  --  135* 132* 136*  K  --   --    < > 4.3  --  4.1 4.2 4.0  CL  --   --    < >  --    < > 102 100 104  CO2  --   --    < >  --    < > 25 24 25   GLUCOSE  --   --   --   --   --   --  119*  --   BUN  --   --    < > 13  --  13 16 10   CREATININE  --   --    < > 0.7  --  0.7 0.55 0.6  CALCIUM  --   --    < >  --    < > 8.8 9.4 8.6  TSH 2.43 0.69  --  2.13  --   --   --   --    < > = values in this interval not displayed.   Liver Function Tests: Recent Labs    11/20/18 04/15/19 04/16/19 06/26/19  AST 19 14  --  14  ALT 12 11  --  13  ALKPHOS 59 55  --  61  PROT  --   --  5.7 5.3*  ALBUMIN 3.7  --  3.4 3.2   No results for input(s): LIPASE, AMYLASE in the last 8760 hours. No results for input(s): AMMONIA in the last 8760 hours. CBC: Recent Labs    06/14/19 2225 06/17/19 0325 06/26/19  WBC 15.1* 10.2 7.4  NEUTROABS 11.3*  --  3,781  HGB 13.9 14.5 13.0  HCT 41.4 41.4 38  MCV 98.6 97.9  --   PLT 183 162 233   Lipid Panel: No results for input(s): CHOL, HDL, LDLCALC, TRIG, CHOLHDL, LDLDIRECT in the last 8760 hours. Lab Results  Component Value Date   HGBA1C 5.7 06/18/2018    Procedures since last visit: No results found.  Assessment/Plan Pneumonia of left lower lobe due to infectious organism Doing well Son really concerned about her h/o Pneumothorax but xray did not show that. D/W him Will start her on Doxycyline Discontinue Augmentin. She never got any  dose Will Repeat Chest Xray in 8 weeks to see full resolution   Hypothyroidism, unspecified type TSH Normal in 8/20 Depression, recurrent ([place]) Doing better in her Room On Lexapro Off her Seroquel now  Vascular dementia without behavioral disturbance  On Aricept     Labs/tests ordered:  * No order type specified * Next appt:  Visit date  not found  Total time spent in this patient care encounter was  40_  minutes; greater than 50% of the visit spent counseling patient and staff, reviewing records , Labs and coordinating care for problems addressed at this encounter.

## 2019-09-17 ENCOUNTER — Non-Acute Institutional Stay: Payer: Medicare PPO | Admitting: Nurse Practitioner

## 2019-09-17 ENCOUNTER — Encounter: Payer: Self-pay | Admitting: Nurse Practitioner

## 2019-09-17 DIAGNOSIS — E039 Hypothyroidism, unspecified: Secondary | ICD-10-CM | POA: Diagnosis not present

## 2019-09-17 DIAGNOSIS — E871 Hypo-osmolality and hyponatremia: Secondary | ICD-10-CM

## 2019-09-17 DIAGNOSIS — F0150 Vascular dementia without behavioral disturbance: Secondary | ICD-10-CM

## 2019-09-17 DIAGNOSIS — F339 Major depressive disorder, recurrent, unspecified: Secondary | ICD-10-CM

## 2019-09-17 NOTE — Progress Notes (Signed)
Location:   [facility] Room Number: [place] of Service:  ALF [phone]) Provider:  Ashby Leflore NP  [name], MD  Patient Care Team: [name], MD as PCP - General (Internal Medicine) Tosh Glaze X, NP as Nurse Practitioner (Internal Medicine)  Extended Emergency Contact Information Primary Emergency Contact: Bolio,[name] Address: [address]          [place], [place] [address] [name] of [place] Phone: [phone] Mobile Phone: [phone] Relation: Son Secondary Emergency Contact: Senske,Brett Address: [address]          [place], [place] [address] [name] of [place] Phone: [phone] Work Phone: [phone] Mobile Phone: [phone] Relation: Son  Code Status:  DNR Goals of care: Advanced Directive information Advanced Directives 06/26/2019  Does Patient Have a Medical Advance Directive? Yes  Type of Advance Directive Out of facility DNR (pink MOST or yellow form)  Does patient want to make changes to medical advance directive? No - Patient declined  Copy of [place] in Chart? -  Pre-existing out of facility DNR order (yellow form or pink MOST form) -     Chief Complaint  Patient presents with  . Medical Management of Chronic Issues  . Health Maintenance    Influenza vaccine, TDAP    HPI:  Pt is a 86 y.o. female seen today for medical management of chronic diseases.    The patient resides in [facility] for safety, care assistance, ambulates with walker, on Donepezil 5mg  qd for memory. Her mood is stable on Escitalopram 10mg  qd. Hx of hypothyroidism, stable, taking Levothyroxine 136mcg qd.   The patient was treated with Doxycycline 08/14/19 for left lower lobe PNA. She has recovered clinically, f/u CXR in 8 weeks pending.    Past Medical History:  Diagnosis Date  . Eczema   . Hypothyroid   . Hypothyroidism   . Hypothyroidism   . Vitamin D deficiency    Past Surgical History:    Procedure Laterality Date  . CHOLECYSTECTOMY N/A 06/24/2013   Procedure: LAPAROSCOPIC CHOLECYSTECTOMY WITH INTRAOPERATIVE CHOLANGIOGRAM;  Surgeon: Ralene Ok, MD;  Location: [facility];  Service: General;  Laterality: N/A;  . TONSILLECTOMY      No Known Allergies  Allergies as of 09/17/2019   No Known Allergies     Medication List       Accurate as of September 17, 2019  2:28 PM. If you have any questions, ask your nurse or doctor.        aspirin EC 81 MG tablet Take 1 tablet (81 mg total) by mouth daily.   calcium-vitamin D 500-200 MG-UNIT tablet Commonly known as: OSCAL WITH D Take 1 tablet by mouth 2 (two) times daily. Once daily   donepezil 5 MG tablet Commonly known as: ARICEPT Take 1 tablet (5 mg total) by mouth at bedtime.   escitalopram 10 MG tablet Commonly known as: LEXAPRO Take 10 mg by mouth daily.   levothyroxine 100 MCG tablet Commonly known as: SYNTHROID Take 100 mcg by mouth daily before breakfast.      ROS was provided with assistance of staff.  Review of Systems  Constitutional: Negative for activity change, appetite change, chills, diaphoresis, fatigue, fever and unexpected weight change.  HENT: Positive for hearing loss. Negative for congestion and voice change.   Eyes: Negative for visual disturbance.  Respiratory: Negative for cough, shortness of breath and wheezing.   Cardiovascular: Negative for chest pain, palpitations and leg swelling.  Gastrointestinal: Negative for abdominal distention, abdominal pain, constipation, diarrhea, nausea and vomiting.  Genitourinary: Negative for difficulty urinating, dysuria and urgency.  Musculoskeletal: Positive for gait problem.  Skin: Negative for color change and pallor.  Neurological: Negative for dizziness, speech difficulty, weakness and headaches.       Dementia  Psychiatric/Behavioral: Positive for confusion. Negative for agitation, behavioral problems, hallucinations and sleep disturbance. The  patient is not nervous/anxious.     Immunization History  Administered Date(s) Administered  . Influenza Whole 06/15/2018  . Influenza,inj,Quad PF,6+ Mos 06/25/2013  . Pneumococcal Conjugate-13 03/11/2014  . Pneumococcal Polysaccharide-23 09/11/1986, 09/11/2001, 06/25/2013  . Zoster Recombinat (Shingrix) 09/12/2007   Pertinent  Health Maintenance Due  Topic Date Due  . INFLUENZA VACCINE  04/12/2019  . DEXA SCAN  Completed  . PNA vac Low Risk Adult  Completed   Fall Risk  04/11/2019 10/02/2017  Falls in the past year? 0 Yes  Comment Emmi Telephone Survey: data to providers prior to load -  Number falls in past yr: - 1  Injury with Fall? - No   Functional Status Survey:    Vitals:   09/17/19 0829  BP: 124/76  Pulse: 66  Resp: 18  Temp: (!) 97 F (36.1 C)  SpO2: 93%  Weight: 166 lb 6.4 oz (75.5 kg)  Height: 5\' 2"  (1.575 m)   Body mass index is 30.43 kg/m. Physical Exam Vitals and nursing note reviewed.  Constitutional:      General: She is not in acute distress.    Appearance: Normal appearance. She is obese. She is not ill-appearing, toxic-appearing or diaphoretic.  HENT:     Head: Normocephalic and atraumatic.     Nose: Nose normal.     Mouth/Throat:     Mouth: Mucous membranes are moist.  Eyes:     Extraocular Movements: Extraocular movements intact.     Conjunctiva/sclera: Conjunctivae normal.     Pupils: Pupils are equal, round, and reactive to light.  Cardiovascular:     Rate and Rhythm: Normal rate and regular rhythm.     Heart sounds: No murmur.  Pulmonary:     Breath sounds: No wheezing, rhonchi or rales.  Abdominal:     General: Bowel sounds are normal. There is no distension.     Palpations: Abdomen is soft.     Tenderness: There is no abdominal tenderness. There is no right CVA tenderness, left CVA tenderness, guarding or rebound.  Musculoskeletal:     Cervical back: Normal range of motion and neck supple.     Right lower leg: No edema.     Left  lower leg: No edema.  Skin:    General: Skin is warm and dry.  Neurological:     General: No focal deficit present.     Mental Status: She is alert. Mental status is at baseline.     Motor: No weakness.     Coordination: Coordination normal.     Gait: Gait abnormal.     Comments: Oriented to person, place.   Psychiatric:        Mood and Affect: Mood normal.        Behavior: Behavior normal.     Labs reviewed: Recent Labs    05/29/19 0000 06/14/19 2225 06/26/19 0000  NA 135* 132* 136*  K 4.1 4.2 4.0  CL 102 100 104  CO2 25 24 25   GLUCOSE  --  119*  --   BUN 13 16 10   CREATININE 0.7 0.55 0.6  CALCIUM  8.8 9.4 8.6   Recent Labs    11/20/18 0000 04/15/19 0000 04/16/19 0000 06/26/19 0000  AST 19 14  --  14  ALT 12 11  --  13  ALKPHOS 59 55  --  61  PROT  --   --  5.7 5.3*  ALBUMIN 3.7  --  3.4 3.2   Recent Labs    06/14/19 2225 06/17/19 0325 06/26/19 0000  WBC 15.1* 10.2 7.4  NEUTROABS 11.3*  --  3,781  HGB 13.9 14.5 13.0  HCT 41.4 41.4 38  MCV 98.6 97.9  --   PLT 183 162 233   Lab Results  Component Value Date   TSH 2.13 04/15/2019   Lab Results  Component Value Date   HGBA1C 5.7 06/18/2018   Lab Results  Component Value Date   CHOL 188 01/30/2018   HDL 64 01/30/2018   LDLCALC 106 (H) 01/30/2018   TRIG 89 01/30/2018   CHOLHDL 2.9 01/30/2018    Significant Diagnostic Results in last 30 days:  No results found.  Assessment/Plan Hypothyroidism TSH 2.13 04/15/19, continue Levothyroxine 164mcg qd.   Dementia without behavioral disturbance ([place]) Continue [facility] for safety, care assistance, continue Donepezil for memory.   Depression, recurrent ([place]) Her mood is stable, continue Escitalopram   Hyponatremia Resolved, serum Na 136 06/26/19     Family/ staff Communication: plan of care reviewed with the patient and charge nurse.   Labs/tests ordered:  none  Time spend 40 minutes.

## 2019-09-17 NOTE — Assessment & Plan Note (Addendum)
Resolved, serum Na 136 06/26/19

## 2019-09-17 NOTE — Assessment & Plan Note (Signed)
TSH 2.13 04/15/19, continue Levothyroxine 125mcg qd.

## 2019-09-17 NOTE — Assessment & Plan Note (Signed)
Continue [facility] for safety, care assistance, continue Donepezil for memory.

## 2019-09-17 NOTE — Assessment & Plan Note (Signed)
Her mood is stable, continue Escitalopram.  

## 2019-10-10 ENCOUNTER — Non-Acute Institutional Stay: Payer: Medicare PPO | Admitting: Nurse Practitioner

## 2019-10-10 ENCOUNTER — Encounter: Payer: Self-pay | Admitting: Nurse Practitioner

## 2019-10-10 DIAGNOSIS — F339 Major depressive disorder, recurrent, unspecified: Secondary | ICD-10-CM | POA: Diagnosis not present

## 2019-10-10 DIAGNOSIS — F0150 Vascular dementia without behavioral disturbance: Secondary | ICD-10-CM

## 2019-10-10 DIAGNOSIS — W19XXXA Unspecified fall, initial encounter: Secondary | ICD-10-CM | POA: Insufficient documentation

## 2019-10-10 DIAGNOSIS — S0003XA Contusion of scalp, initial encounter: Secondary | ICD-10-CM | POA: Insufficient documentation

## 2019-10-10 DIAGNOSIS — R269 Unspecified abnormalities of gait and mobility: Secondary | ICD-10-CM | POA: Diagnosis not present

## 2019-10-10 DIAGNOSIS — T148XXA Other injury of unspecified body region, initial encounter: Secondary | ICD-10-CM | POA: Insufficient documentation

## 2019-10-10 DIAGNOSIS — S0101XA Laceration without foreign body of scalp, initial encounter: Secondary | ICD-10-CM | POA: Insufficient documentation

## 2019-10-10 NOTE — Progress Notes (Signed)
Location:   [facility] Room Number: [place] of Service:  ALF [phone]) Provider:  Amrom Ore NP  [name], MD  Patient Care Team: [name], MD as PCP - General (Internal Medicine) Shelina Luo X, NP as Nurse Practitioner (Internal Medicine)  Extended Emergency Contact Information Primary Emergency Contact: Placido,[name] Address: [address]          [place], [place] [address] [name] of [place] Phone: [phone] Mobile Phone: [phone] Relation: Son Secondary Emergency Contact: Nylund,Brett Address: [address]          [place], [place] [address] [name] of [place] Phone: [phone] Work Phone: [phone] Mobile Phone: [phone] Relation: Son  Code Status:  DNR Goals of care: Advanced Directive information Advanced Directives 10/10/2019  Does Patient Have a Medical Advance Directive? Yes  Type of Paramedic of [place];Living will;Out of facility DNR (pink MOST or yellow form)  Does patient want to make changes to medical advance directive? No - Patient declined  Copy of [place] in Chart? Yes - validated most recent copy scanned in chart (See row information)  Pre-existing out of facility DNR order (yellow form or pink MOST form) Yellow form placed in chart (order not valid for inpatient use);Pink MOST form placed in chart (order not valid for inpatient use)     Chief Complaint  Patient presents with  . Acute Visit    Fall, hematoma    HPI:  Pt is a 86 y.o. female seen today for an acute visit for 10/10/19 fall, the patient was found sitting at base of bed, stated I hit my head on the floor when got up too quickly and slid on the floor, resulted a hematoma left occiput, no further c/o dizziness upon my examination. Hold ASA x1, no focal weakness or change of mentation today. Hx of dementia, resides in [facility] for safety, care assistance, ambulates with walker,  on Donepezil for memory, her mood is stable on Escitalopram '10mg'$  qd.    Past Medical History:  Diagnosis Date  . Eczema   . Hypothyroid   . Hypothyroidism   . Hypothyroidism   . Vitamin D deficiency    Past Surgical History:  Procedure Laterality Date  . CHOLECYSTECTOMY N/A 06/24/2013   Procedure: LAPAROSCOPIC CHOLECYSTECTOMY WITH INTRAOPERATIVE CHOLANGIOGRAM;  Surgeon: Ralene Ok, MD;  Location: [facility];  Service: General;  Laterality: N/A;  . TONSILLECTOMY      No Known Allergies  Allergies as of 10/10/2019   No Known Allergies     Medication List       Accurate as of October 10, 2019  2:55 PM. If you have any questions, ask your nurse or doctor.        aspirin EC 81 MG tablet Take 1 tablet (81 mg total) by mouth daily.   calcium-vitamin D 500-200 MG-UNIT tablet Commonly known as: OSCAL WITH D Take 1 tablet by mouth 2 (two) times daily. Once daily   donepezil 5 MG tablet Commonly known as: ARICEPT Take 1 tablet (5 mg total) by mouth at bedtime.   escitalopram 10 MG tablet Commonly known as: LEXAPRO Take 10 mg by mouth daily.   levothyroxine 100 MCG tablet Commonly known as: SYNTHROID Take 100 mcg by mouth daily before breakfast.      ROS was provided with assistance of staff.  Review of Systems  Constitutional: Negative for activity change, appetite change, chills, diaphoresis, fatigue and fever.  HENT: Positive for hearing loss. Negative for congestion and voice change.   Eyes: Negative for visual disturbance.  Respiratory: Negative for cough, shortness of breath and wheezing.   Cardiovascular: Negative for chest pain, palpitations and leg swelling.  Gastrointestinal: Negative for abdominal distention, abdominal pain, constipation, diarrhea, nausea and vomiting.  Genitourinary: Negative for difficulty urinating, dysuria and urgency.  Musculoskeletal: Positive for gait problem.  Skin: Positive for wound.  Neurological: Negative for dizziness,  tremors, seizures, syncope, speech difficulty, weakness and headaches.       Dementia.   Psychiatric/Behavioral: Positive for confusion. Negative for agitation, behavioral problems, hallucinations and sleep disturbance. The patient is not nervous/anxious.     Immunization History  Administered Date(s) Administered  . Influenza Whole 06/15/2018  . Influenza,inj,Quad PF,6+ Mos 06/25/2013  . Pneumococcal Conjugate-13 03/11/2014  . Pneumococcal Polysaccharide-23 09/11/1986, 09/11/2001, 06/25/2013  . Zoster Recombinat (Shingrix) 09/12/2007   Pertinent  Health Maintenance Due  Topic Date Due  . INFLUENZA VACCINE  04/12/2019  . DEXA SCAN  Completed  . PNA vac Low Risk Adult  Completed   Fall Risk  04/11/2019 10/02/2017  Falls in the past year? 0 Yes  Comment Emmi Telephone Survey: data to providers prior to load -  Number falls in past yr: - 1  Injury with Fall? - No   Functional Status Survey:    Vitals:   10/10/19 0942  BP: 140/68  Pulse: 68  Resp: 20  Temp: (!) 97.5 F (36.4 C)  SpO2: 93%  Weight: 166 lb 6.4 oz (75.5 kg)  Height: '5\' 2"'$  (1.575 m)   Body mass index is 30.43 kg/m. Physical Exam Vitals and nursing note reviewed.  Constitutional:      General: She is not in acute distress.    Appearance: Normal appearance. She is not ill-appearing or diaphoretic.  HENT:     Head: Normocephalic and atraumatic.     Nose: Nose normal.     Mouth/Throat:     Mouth: Mucous membranes are moist.  Eyes:     Extraocular Movements: Extraocular movements intact.     Conjunctiva/sclera: Conjunctivae normal.     Pupils: Pupils are equal, round, and reactive to light.  Cardiovascular:     Rate and Rhythm: Normal rate and regular rhythm.     Heart sounds: No murmur.  Pulmonary:     Breath sounds: No wheezing, rhonchi or rales.  Abdominal:     General: Bowel sounds are normal. There is no distension.     Palpations: Abdomen is soft.     Tenderness: There is no abdominal tenderness.  There is no right CVA tenderness, left CVA tenderness, guarding or rebound.  Musculoskeletal:     Cervical back: Normal range of motion and neck supple.     Right lower leg: No edema.     Left lower leg: No edema.  Skin:    General: Skin is warm and dry.     Comments: The patient's palm sized hematoma @ left parietal/occiput.   Neurological:     General: No focal deficit present.     Mental Status: She is alert. Mental status is at baseline.     Motor: No weakness.     Coordination: Coordination normal.     Gait: Gait abnormal.     Comments: Oriented to person, place.   Psychiatric:        Mood and Affect: Mood normal.        Behavior: Behavior normal.  Thought Content: Thought content normal.     Labs reviewed: Recent Labs    05/29/19 0000 06/14/19 2225 06/26/19 0000  NA 135* 132* 136*  K 4.1 4.2 4.0  CL 102 100 104  CO2 '25 24 25  '$ GLUCOSE  --  119*  --   BUN '13 16 10  '$ CREATININE 0.7 0.55 0.6  CALCIUM 8.8 9.4 8.6   Recent Labs    11/20/18 0000 04/15/19 0000 04/16/19 0000 06/26/19 0000  AST 19 14  --  14  ALT 12 11  --  13  ALKPHOS 59 55  --  61  PROT  --   --  5.7 5.3*  ALBUMIN 3.7  --  3.4 3.2   Recent Labs    06/14/19 2225 06/17/19 0325 06/26/19 0000  WBC 15.1* 10.2 7.4  NEUTROABS 11.3*  --  3,781  HGB 13.9 14.5 13.0  HCT 41.4 41.4 38  MCV 98.6 97.9  --   PLT 183 162 233   Lab Results  Component Value Date   TSH 2.13 04/15/2019   Lab Results  Component Value Date   HGBA1C 5.7 06/18/2018   Lab Results  Component Value Date   CHOL 188 01/30/2018   HDL 64 01/30/2018   LDLCALC 106 (H) 01/30/2018   TRIG 89 01/30/2018   CHOLHDL 2.9 01/30/2018    Significant Diagnostic Results in last 30 days:  No results found.  Assessment/Plan Fall 10/10/19 fall, the patient was found sitting at base of bed, stated I hit my head on the floor when got up too quickly and slid on the floor, resulted a hematoma left occiput, no further c/o dizziness  upon my examination. Hold ASA x1, update CBC/diff, CMP/eGFR. The patient's poor safety awareness, unsteady gait are contributory for her falling, close supervision for safety needed.   Scalp hematoma, initial encounter Left parietal/occiput, sized about her palm, no active bleeding, no focal weakness, held ASA x1, observe.   Gait abnormality Continue ambulating with walker, close supervision for safety.   Depression, recurrent ([place]) Her mood is stable, continue Escitalopram.   Dementia without behavioral disturbance ([place]) Continue [facility] for safety, care assistance, continue Donepezil for memory. Observe      Family/ staff Communication: plan of care reviewed with the patient and charge nurse.   Labs/tests ordered:  CBC/diff, CMP/eGFR  Time spend 40 minutes.

## 2019-10-10 NOTE — Assessment & Plan Note (Signed)
10/10/19 fall, the patient was found sitting at base of bed, stated I hit my head on the floor when got up too quickly and slid on the floor, resulted a hematoma left occiput, no further c/o dizziness upon my examination. Hold ASA x1, update CBC/diff, CMP/eGFR. The patient's poor safety awareness, unsteady gait are contributory for her falling, close supervision for safety needed.

## 2019-10-10 NOTE — Assessment & Plan Note (Signed)
Her mood is stable, continue Escitalopram.

## 2019-10-10 NOTE — Assessment & Plan Note (Signed)
Continue [facility] for safety, care assistance, continue Donepezil for memory. Observe

## 2019-10-10 NOTE — Assessment & Plan Note (Signed)
Left parietal/occiput, sized about her palm, no active bleeding, no focal weakness, held ASA x1, observe.

## 2019-10-10 NOTE — Assessment & Plan Note (Signed)
Continue ambulating with walker, close supervision for safety.

## 2019-10-13 ENCOUNTER — Non-Acute Institutional Stay: Payer: Medicare PPO | Admitting: Nurse Practitioner

## 2019-10-13 ENCOUNTER — Encounter: Payer: Self-pay | Admitting: Nurse Practitioner

## 2019-10-13 DIAGNOSIS — F0150 Vascular dementia without behavioral disturbance: Secondary | ICD-10-CM | POA: Diagnosis not present

## 2019-10-13 DIAGNOSIS — F339 Major depressive disorder, recurrent, unspecified: Secondary | ICD-10-CM | POA: Diagnosis not present

## 2019-10-13 DIAGNOSIS — J189 Pneumonia, unspecified organism: Secondary | ICD-10-CM | POA: Diagnosis not present

## 2019-10-13 NOTE — Progress Notes (Signed)
Location:   [facility] Room Number: [place] of Service:  ALF [phone]) Provider:  Othmar Ringer NP  [name], MD  Patient Care Team: [name], MD as PCP - General (Internal Medicine) Niyah Mamaril X, NP as Nurse Practitioner (Internal Medicine)  Extended Emergency Contact Information Primary Emergency Contact: Mechling,[name] Address: [address]          [place], [place] [address] [name] of [place] Phone: [phone] Mobile Phone: [phone] Relation: Son Secondary Emergency Contact: Wojciak,Brett Address: [address]          [place], [place] [address] [name] of [place] Phone: [phone] Work Phone: [phone] Mobile Phone: [phone] Relation: Son  Code Status:  DNR Goals of care: Advanced Directive information Advanced Directives 10/13/2019  Does Patient Have a Medical Advance Directive? Yes  Type of Advance Directive Out of facility DNR (pink MOST or yellow form);Healthcare Power of Attorney  Does patient want to make changes to medical advance directive? No - Patient declined  Copy of [place] in Chart? Yes - validated most recent copy scanned in chart (See row information)  Pre-existing out of facility DNR order (yellow form or pink MOST form) Yellow form placed in chart (order not valid for inpatient use);Pink MOST form placed in chart (order not valid for inpatient use)     Chief Complaint  Patient presents with  . Acute Visit    Pneumonia    HPI:  Pt is a 86 y.o. female seen today for an acute visit for repeated CXR showed left base infiltrate, the patient has been fully treated with Doxycycline 08/2019 for left lower lung pneumonia. Hx of dementia, on Donepezil. Her mood is stable on Escitalopram 10mg  qd.    Past Medical History:  Diagnosis Date  . Eczema   . Hypothyroid   . Hypothyroidism   . Hypothyroidism   . Vitamin D deficiency    Past Surgical History:  Procedure  Laterality Date  . CHOLECYSTECTOMY N/A 06/24/2013   Procedure: LAPAROSCOPIC CHOLECYSTECTOMY WITH INTRAOPERATIVE CHOLANGIOGRAM;  Surgeon: Ralene Ok, MD;  Location: [facility];  Service: General;  Laterality: N/A;  . TONSILLECTOMY      No Known Allergies  Allergies as of 10/13/2019   No Known Allergies     Medication List       Accurate as of October 13, 2019  1:27 PM. If you have any questions, ask your nurse or doctor.        aspirin EC 81 MG tablet Take 1 tablet (81 mg total) by mouth daily.   azithromycin 250 MG tablet Commonly known as: ZITHROMAX Take 250 mg by mouth daily. For 4 days   calcium-vitamin D 500-200 MG-UNIT tablet Commonly known as: OSCAL WITH D Take 1 tablet by mouth 2 (two) times daily. Once daily   donepezil 5 MG tablet Commonly known as: ARICEPT Take 1 tablet (5 mg total) by mouth at bedtime.   escitalopram 10 MG tablet Commonly known as: LEXAPRO Take 10 mg by mouth daily.   levothyroxine 100 MCG tablet Commonly known as: SYNTHROID Take 100 mcg by mouth daily before breakfast.   saccharomyces boulardii 250 MG capsule Commonly known as: FLORASTOR Take 250 mg by mouth 2 (two) times daily.       Review of Systems  Constitutional: Negative for activity change, appetite change, chills, diaphoresis, fatigue and fever.  HENT: Positive for hearing loss. Negative for congestion and voice change.   Eyes: Negative  for visual disturbance.  Respiratory: Negative for cough, shortness of breath and wheezing.   Gastrointestinal: Negative for abdominal distention, abdominal pain, constipation, diarrhea, nausea and vomiting.  Genitourinary: Negative for difficulty urinating, dysuria and urgency.  Musculoskeletal: Positive for gait problem.  Skin: Positive for wound. Negative for color change and pallor.       Left parietal/occuput hematoma.   Neurological: Negative for dizziness, speech difficulty, weakness and headaches.       Memory lapses.     Psychiatric/Behavioral: Negative for agitation, behavioral problems, hallucinations and sleep disturbance. The patient is not nervous/anxious.     Immunization History  Administered Date(s) Administered  . Influenza Whole 06/15/2018  . Influenza,inj,Quad PF,6+ Mos 06/25/2013  . Pneumococcal Conjugate-13 03/11/2014  . Pneumococcal Polysaccharide-23 09/11/1986, 09/11/2001, 06/25/2013  . Zoster Recombinat (Shingrix) 09/12/2007   Pertinent  Health Maintenance Due  Topic Date Due  . INFLUENZA VACCINE  04/12/2019  . DEXA SCAN  Completed  . PNA vac Low Risk Adult  Completed   Fall Risk  04/11/2019 10/02/2017  Falls in the past year? 0 Yes  Comment Emmi Telephone Survey: data to providers prior to load -  Number falls in past yr: - 1  Injury with Fall? - No   Functional Status Survey:    Vitals:   10/13/19 1135  BP: 140/80  Pulse: 64  Resp: (!) 24  Temp: (!) 97.2 F (36.2 C)  SpO2: 90%  Weight: 166 lb 6.4 oz (75.5 kg)  Height: 5\' 2"  (1.575 m)   Body mass index is 30.43 kg/m. Physical Exam Vitals and nursing note reviewed.  Constitutional:      General: She is not in acute distress.    Appearance: Normal appearance. She is not ill-appearing, toxic-appearing or diaphoretic.  HENT:     Head: Normocephalic and atraumatic.     Nose: Nose normal.     Mouth/Throat:     Mouth: Mucous membranes are moist.  Eyes:     Extraocular Movements: Extraocular movements intact.     Conjunctiva/sclera: Conjunctivae normal.     Pupils: Pupils are equal, round, and reactive to light.  Cardiovascular:     Rate and Rhythm: Normal rate and regular rhythm.     Heart sounds: No murmur.  Pulmonary:     Breath sounds: No wheezing, rhonchi or rales.  Abdominal:     General: Bowel sounds are normal. There is no distension.     Palpations: Abdomen is soft.     Tenderness: There is no abdominal tenderness. There is no right CVA tenderness, left CVA tenderness, guarding or rebound.     Hernia: A  hernia is present.     Comments: Umbilical hernia.   Musculoskeletal:     Cervical back: Normal range of motion and neck supple.     Right lower leg: No edema.     Left lower leg: No edema.  Skin:    General: Skin is warm and dry.     Comments: Left parietal/occiput hematoma is resolving.   Neurological:     General: No focal deficit present.     Mental Status: She is alert. Mental status is at baseline.     Motor: No weakness.     Coordination: Coordination normal.     Gait: Gait abnormal.     Comments: Oriented to person, place.   Psychiatric:        Mood and Affect: Mood normal.        Behavior: Behavior normal.  Labs reviewed: Recent Labs    05/29/19 0000 06/14/19 2225 06/26/19 0000  NA 135* 132* 136*  K 4.1 4.2 4.0  CL 102 100 104  CO2 25 24 25   GLUCOSE  --  119*  --   BUN 13 16 10   CREATININE 0.7 0.55 0.6  CALCIUM 8.8 9.4 8.6   Recent Labs    11/20/18 0000 04/15/19 0000 04/16/19 0000 06/26/19 0000  AST 19 14  --  14  ALT 12 11  --  13  ALKPHOS 59 55  --  61  PROT  --   --  5.7 5.3*  ALBUMIN 3.7  --  3.4 3.2   Recent Labs    06/14/19 2225 06/17/19 0325 06/26/19 0000  WBC 15.1* 10.2 7.4  NEUTROABS 11.3*  --  3,781  HGB 13.9 14.5 13.0  HCT 41.4 41.4 38  MCV 98.6 97.9  --   PLT 183 162 233   Lab Results  Component Value Date   TSH 2.13 04/15/2019   Lab Results  Component Value Date   HGBA1C 5.7 06/18/2018   Lab Results  Component Value Date   CHOL 188 01/30/2018   HDL 64 01/30/2018   LDLCALC 106 (H) 01/30/2018   TRIG 89 01/30/2018   CHOLHDL 2.9 01/30/2018    Significant Diagnostic Results in last 30 days:  No results found.  Assessment/Plan Pneumonia 08/13/20 treated with Doxy for left lower lung PNA 10/10/19 repeated CXR mild pulmonary infiltrate left lung base. Dupta no further tx.  10/13/19 the patient is asymptomatic  Dementia without behavioral disturbance ([place]) The patient is functioning well in [facility], ambulates with  walker, risk for falling, continue Doenepezil for memory.   Depression, recurrent ([place]) Her mood is table, continue Escitalopram.      Family/ staff Communication: plan of care reviewed with the patient and charge nurse.   Labs/tests ordered:  none  Time spend 40 minutes.

## 2019-10-13 NOTE — Assessment & Plan Note (Signed)
Her mood is table, continue Escitalopram.

## 2019-10-13 NOTE — Assessment & Plan Note (Signed)
The patient is functioning well in [facility], ambulates with walker, risk for falling, continue Doenepezil for memory.

## 2019-10-13 NOTE — Assessment & Plan Note (Signed)
08/13/20 treated with Doxy for left lower lung PNA 10/10/19 repeated CXR mild pulmonary infiltrate left lung base. Dupta no further tx.  10/13/19 the patient is asymptomatic

## 2019-10-14 LAB — BASIC METABOLIC PANEL
BUN: 13 (ref 4–21)
CO2: 26 — AB (ref 13–22)
Chloride: 103 (ref 99–108)
Creatinine: 0.7 (ref 0.5–1.1)
Glucose: 96
Potassium: 4.4 (ref 3.4–5.3)
Sodium: 135 — AB (ref 137–147)

## 2019-10-14 LAB — COMPREHENSIVE METABOLIC PANEL
Albumin: 3.3 — AB (ref 3.5–5.0)
Calcium: 8.8 (ref 8.7–10.7)
Globulin: 2.1

## 2019-10-14 LAB — CBC AND DIFFERENTIAL
HCT: 43 (ref 36–46)
Hemoglobin: 14.6 (ref 12.0–16.0)
Neutrophils Absolute: 3234
Platelets: 167 (ref 150–399)
WBC: 7

## 2019-10-14 LAB — HEPATIC FUNCTION PANEL
ALT: 12 (ref 7–35)
AST: 17 (ref 13–35)
Alkaline Phosphatase: 67 (ref 25–125)
Bilirubin, Total: 0.8

## 2019-10-14 LAB — CBC: RBC: 4.4 (ref 3.87–5.11)

## 2019-10-27 ENCOUNTER — Encounter: Payer: Self-pay | Admitting: Nurse Practitioner

## 2019-10-27 ENCOUNTER — Non-Acute Institutional Stay: Payer: Medicare PPO | Admitting: Nurse Practitioner

## 2019-10-27 DIAGNOSIS — F0150 Vascular dementia without behavioral disturbance: Secondary | ICD-10-CM | POA: Diagnosis not present

## 2019-10-27 DIAGNOSIS — W19XXXA Unspecified fall, initial encounter: Secondary | ICD-10-CM

## 2019-10-27 DIAGNOSIS — R269 Unspecified abnormalities of gait and mobility: Secondary | ICD-10-CM | POA: Diagnosis not present

## 2019-10-27 DIAGNOSIS — T148XXA Other injury of unspecified body region, initial encounter: Secondary | ICD-10-CM | POA: Diagnosis not present

## 2019-10-27 DIAGNOSIS — F339 Major depressive disorder, recurrent, unspecified: Secondary | ICD-10-CM

## 2019-10-27 NOTE — Assessment & Plan Note (Signed)
Left parietal hematoma, sized about an gold ball sized, no pain or s/s of bleeding noted. Observe.

## 2019-10-27 NOTE — Assessment & Plan Note (Signed)
Continue [facility] for safety, care assistance, continue Donepezil for memory, the patient needs reminder, verbal cues for safety.

## 2019-10-27 NOTE — Assessment & Plan Note (Addendum)
10/27/19 fall when the patient was found sitting on floor in the doorway of her bathroom, stated that she was going to the restroom and lost traction with her socks and slid to the floor. Gripper socks placed. Left parietal a golf ball sized hematoma noted. Close supervision for safety needed due to the patient's poor safety awareness and her gait abnormality.

## 2019-10-27 NOTE — Assessment & Plan Note (Signed)
Her mood is stable, continue Escitalopram.

## 2019-10-27 NOTE — Progress Notes (Signed)
Location:   [place] Room Number: [facility] of Service:  ALF [phone]) Provider:  Diago Haik NP  [name], MD  Patient Care Team: [name], MD as PCP - General (Internal Medicine) Edda Orea X, NP as Nurse Practitioner (Internal Medicine)  Extended Emergency Contact Information Primary Emergency Contact: Garciaperez,[name] Address: [address]          [place], [place] [address] [name] of [place] Phone: [phone] Mobile Phone: [phone] Relation: Son Secondary Emergency Contact: Widrig,Brett Address: [address]          [place],  [address] [name] of [place] Phone: [phone] Work Phone: [phone] Mobile Phone: [phone] Relation: Son  Code Status:  DNR Goals of care: Advanced Directive information Advanced Directives 10/27/2019  Does Patient Have a Medical Advance Directive? Yes  Type of Advance Directive Out of facility DNR (pink MOST or yellow form);Healthcare Power of Attorney  Does patient want to make changes to medical advance directive? No - Patient declined  Copy of [place] in Chart? Yes - validated most recent copy scanned in chart (See row information)  Pre-existing out of facility DNR order (yellow form or pink MOST form) Yellow form placed in chart (order not valid for inpatient use);Pink MOST form placed in chart (order not valid for inpatient use)     Chief Complaint  Patient presents with  . Acute Visit    Fall    HPI:  Pt is a 86 y.o. female seen today for an acute visit for 10/27/19 fall when the patient was found sitting on floor in the doorway of her bathroom, stated that she was going to the restroom and lost traction with her socks and slid to the floor. Gripper socks placed. Left parietal a golf ball sized hematoma noted. The patient resides in [facility] for safety, care assistance, on Donepezil 5mg  qd for memory, her mood is stable, on Escitalopram 10mg   qd.    Past Medical History:  Diagnosis Date  . Eczema   . Hypothyroid   . Hypothyroidism   . Hypothyroidism   . Vitamin D deficiency    Past Surgical History:  Procedure Laterality Date  . CHOLECYSTECTOMY N/A 06/24/2013   Procedure: LAPAROSCOPIC CHOLECYSTECTOMY WITH INTRAOPERATIVE CHOLANGIOGRAM;  Surgeon: Ralene Ok, MD;  Location: [place];  Service: General;  Laterality: N/A;  . TONSILLECTOMY      No Known Allergies  Allergies as of 10/27/2019   No Known Allergies     Medication List       Accurate as of October 27, 2019  2:51 PM. If you have any questions, ask your nurse or doctor.        STOP taking these medications   saccharomyces boulardii 250 MG capsule Commonly known as: FLORASTOR Stopped by: Elita Dame X Kortne All, NP     TAKE these medications   aspirin EC 81 MG tablet Take 1 tablet (81 mg total) by mouth daily.   calcium-vitamin D 500-200 MG-UNIT tablet Commonly known as: OSCAL WITH D Take 1 tablet by mouth 2 (two) times daily. Once daily   donepezil 5 MG tablet Commonly known as: ARICEPT Take 1 tablet (5 mg total) by mouth at bedtime.   escitalopram 10 MG tablet Commonly known as: LEXAPRO Take 10 mg by mouth daily.   levothyroxine 100 MCG tablet Commonly known as: SYNTHROID Take 100 mcg by mouth daily before breakfast.       Review of Systems  Constitutional: Negative for activity change, appetite change, chills, diaphoresis, fatigue and fever.  HENT: Positive for hearing loss. Negative for congestion and voice change.   Eyes: Negative for visual disturbance.  Respiratory: Negative for cough, shortness of breath and wheezing.   Gastrointestinal: Negative for abdominal distention, abdominal pain, constipation, diarrhea, nausea and vomiting.  Genitourinary: Negative for difficulty urinating, dysuria and urgency.  Musculoskeletal: Positive for gait problem.  Skin: Positive for wound. Negative for color change and pallor.       Left parietal  hematoma.   Neurological: Negative for dizziness, speech difficulty, weakness and headaches.       Memory lapses.   Psychiatric/Behavioral: Negative for agitation, behavioral problems, hallucinations and sleep disturbance. The patient is not nervous/anxious.     Immunization History  Administered Date(s) Administered  . Influenza Whole 06/15/2018  . Influenza,inj,Quad PF,6+ Mos 06/25/2013  . Moderna SARS-COVID-2 Vaccination 09/13/2019, 10/11/2019  . Pneumococcal Conjugate-13 03/11/2014  . Pneumococcal Polysaccharide-23 09/11/1986, 09/11/2001, 06/25/2013  . Zoster Recombinat (Shingrix) 09/12/2007   Pertinent  Health Maintenance Due  Topic Date Due  . INFLUENZA VACCINE  04/12/2019  . DEXA SCAN  Completed  . PNA vac Low Risk Adult  Completed   Fall Risk  04/11/2019 10/02/2017  Falls in the past year? 0 Yes  Comment Emmi Telephone Survey: data to providers prior to load -  Number falls in past yr: - 1  Injury with Fall? - No   Functional Status Survey:    Vitals:   10/27/19 1138  BP: 140/70  Pulse: 68  Resp: (!) 24  Temp: (!) 97.2 F (36.2 C)  SpO2: 96%  Weight: 166 lb 6.4 oz (75.5 kg)  Height: 5\' 2"  (1.575 m)   Body mass index is 30.43 kg/m. Physical Exam Vitals and nursing note reviewed.  Constitutional:      General: She is not in acute distress.    Appearance: Normal appearance. She is not ill-appearing, toxic-appearing or diaphoretic.  HENT:     Head: Normocephalic and atraumatic.     Nose: Nose normal.     Mouth/Throat:     Mouth: Mucous membranes are moist.  Eyes:     Extraocular Movements: Extraocular movements intact.     Conjunctiva/sclera: Conjunctivae normal.     Pupils: Pupils are equal, round, and reactive to light.  Cardiovascular:     Rate and Rhythm: Normal rate and regular rhythm.     Heart sounds: No murmur.  Pulmonary:     Breath sounds: No wheezing, rhonchi or rales.  Abdominal:     General: Bowel sounds are normal. There is no distension.      Palpations: Abdomen is soft.     Tenderness: There is no abdominal tenderness. There is no right CVA tenderness, left CVA tenderness, guarding or rebound.     Hernia: A hernia is present.     Comments: Umbilical hernia.   Musculoskeletal:     Cervical back: Normal range of motion and neck supple.     Right lower leg: No edema.     Left lower leg: No edema.  Skin:    General: Skin is warm and dry.     Comments: Left parietal hematoma is about a golf ball sized, no pain or s/s of bleeding.   Neurological:     General: No focal deficit present.     Mental Status: She is alert. Mental status is at baseline.     Motor: No weakness.     Coordination: Coordination normal.  Gait: Gait abnormal.     Comments: Oriented to person, place.   Psychiatric:        Mood and Affect: Mood normal.        Behavior: Behavior normal.     Labs reviewed: Recent Labs    06/14/19 2225 06/26/19 0000 10/14/19 0000  NA 132* 136* 135*  K 4.2 4.0 4.4  CL 100 104 103  CO2 24 25 26*  GLUCOSE 119*  --   --   BUN 16 10 13   CREATININE 0.55 0.6 0.7  CALCIUM 9.4 8.6 8.8   Recent Labs    11/20/18 0000 04/15/19 0000 04/16/19 0000 06/26/19 0000 10/14/19 0000  AST  --  14  --  14 17  ALT  --  11  --  13 12  ALKPHOS  --  55  --  61 67  PROT  --   --  5.7 5.3*  --   ALBUMIN   < >  --  3.4 3.2 3.3*   < > = values in this interval not displayed.   Recent Labs    06/14/19 2225 06/14/19 2225 06/17/19 0325 06/26/19 0000 10/14/19 0000  WBC 15.1*   < > 10.2 7.4 7.0  NEUTROABS 11.3*  --   --  3,781 3,234  HGB 13.9   < > 14.5 13.0 14.6  HCT 41.4   < > 41.4 38 43  MCV 98.6  --  97.9  --   --   PLT 183   < > 162 233 167   < > = values in this interval not displayed.   Lab Results  Component Value Date   TSH 2.13 04/15/2019   Lab Results  Component Value Date   HGBA1C 5.7 06/18/2018   Lab Results  Component Value Date   CHOL 188 01/30/2018   HDL 64 01/30/2018   LDLCALC 106 (H)  01/30/2018   TRIG 89 01/30/2018   CHOLHDL 2.9 01/30/2018    Significant Diagnostic Results in last 30 days:  No results found.  Assessment/Plan Fall 10/27/19 fall when the patient was found sitting on floor in the doorway of her bathroom, stated that she was going to the restroom and lost traction with her socks and slid to the floor. Gripper socks placed. Left parietal a golf ball sized hematoma noted. Close supervision for safety needed due to the patient's poor safety awareness and her gait abnormality.   Hematoma Left parietal hematoma, sized about an gold ball sized, no pain or s/s of bleeding noted. Observe.   Dementia without behavioral disturbance ([place]) Continue [facility] for safety, care assistance, continue Donepezil for memory, the patient needs reminder, verbal cues for safety.   Depression, recurrent ([place]) Her mood is stable, continue Escitalopram.   Gait abnormality Ambulates with walker     Family/ staff Communication: plan of care reviewed with the patient and charge nurse.   Labs/tests ordered:  none  Time spend 40 minutes

## 2019-10-27 NOTE — Assessment & Plan Note (Signed)
Ambulates with walker.  

## 2019-11-25 DIAGNOSIS — Q6689 Other  specified congenital deformities of feet: Secondary | ICD-10-CM | POA: Diagnosis not present

## 2019-11-25 DIAGNOSIS — B351 Tinea unguium: Secondary | ICD-10-CM | POA: Diagnosis not present

## 2019-11-25 DIAGNOSIS — M79671 Pain in right foot: Secondary | ICD-10-CM | POA: Diagnosis not present

## 2019-11-25 DIAGNOSIS — M79672 Pain in left foot: Secondary | ICD-10-CM | POA: Diagnosis not present

## 2019-12-29 ENCOUNTER — Non-Acute Institutional Stay: Payer: Medicare PPO | Admitting: Nurse Practitioner

## 2019-12-29 ENCOUNTER — Encounter: Payer: Self-pay | Admitting: Nurse Practitioner

## 2019-12-29 DIAGNOSIS — R519 Headache, unspecified: Secondary | ICD-10-CM

## 2019-12-29 DIAGNOSIS — F339 Major depressive disorder, recurrent, unspecified: Secondary | ICD-10-CM

## 2019-12-29 DIAGNOSIS — F0150 Vascular dementia without behavioral disturbance: Secondary | ICD-10-CM | POA: Diagnosis not present

## 2019-12-29 NOTE — Assessment & Plan Note (Signed)
Continue [facility] for safety, care assistance, close supervision needed, continue Donepezil for memory.

## 2019-12-29 NOTE — Assessment & Plan Note (Addendum)
Reported pain and swelling to left side of face under ear 12/27/19, afebrile. No pain or swelling upon my examination today.

## 2019-12-29 NOTE — Assessment & Plan Note (Signed)
Her mood is stable, continue Escitalopram.

## 2019-12-29 NOTE — Progress Notes (Signed)
Location:   [place] Room Number: [place] of Service:  ALF [phone]) Provider:  Nicolus Ose, Lennie Odor NP  [name], MD  Patient Care Team: [name], MD as PCP - General (Internal Medicine) Jaion Lagrange X, NP as Nurse Practitioner (Internal Medicine)  Extended Emergency Contact Information Primary Emergency Contact: Delmar,[name] Address: [address]          [place], [place] [address] [name] of [place] Phone: [phone] Mobile Phone: [phone] Relation: Son Secondary Emergency Contact: Kinzie,Brett Address: [address]          [place], [place] [address] [name] of [place] Phone: [phone] Work Phone: [phone] Mobile Phone: [phone] Relation: Son  Code Status:  DNR Goals of care: Advanced Directive information Advanced Directives 12/29/2019  Does Patient Have a Medical Advance Directive? Yes  Type of Advance Directive Living will;Healthcare Power of [place];Out of facility DNR (pink MOST or yellow form)  Does patient want to make changes to medical advance directive? No - Patient declined  Copy of [place] in Chart? Yes - validated most recent copy scanned in chart (See row information)  Pre-existing out of facility DNR order (yellow form or pink MOST form) Yellow form placed in chart (order not valid for inpatient use);Pink MOST form placed in chart (order not valid for inpatient use)     Chief Complaint  Patient presents with  . Acute Visit    Left side face swelling , pain    HPI:  Pt is a 86 y.o. female seen today for an acute visit for reported the patient has pain and swelling to left side of face under ear 12/27/19, afebrile. No pain or swelling upon my examination today. Hx of dementia, resides in [facility] for safety, care assistance, on Donepezil 5mg  qd for memory. Her mood is stable, on Escitalopram 10mg  qd.    Past Medical History:  Diagnosis Date  . Eczema   .  Hypothyroid   . Hypothyroidism   . Hypothyroidism   . Vitamin D deficiency    Past Surgical History:  Procedure Laterality Date  . CHOLECYSTECTOMY N/A 06/24/2013   Procedure: LAPAROSCOPIC CHOLECYSTECTOMY WITH INTRAOPERATIVE CHOLANGIOGRAM;  Surgeon: Ralene Ok, MD;  Location: [place];  Service: General;  Laterality: N/A;  . TONSILLECTOMY      No Known Allergies  Allergies as of 12/29/2019   No Known Allergies     Medication List       Accurate as of December 29, 2019 11:59 PM. If you have any questions, ask your nurse or doctor.        aspirin EC 81 MG tablet Take 1 tablet (81 mg total) by mouth daily.   calcium-vitamin D 500-200 MG-UNIT tablet Commonly known as: OSCAL WITH D Take 1 tablet by mouth 2 (two) times daily. Once daily   donepezil 5 MG tablet Commonly known as: ARICEPT Take 1 tablet (5 mg total) by mouth at bedtime.   escitalopram 10 MG tablet Commonly known as: LEXAPRO Take 10 mg by mouth daily.   levothyroxine 100 MCG tablet Commonly known as: SYNTHROID Take 100 mcg by mouth daily before breakfast.       Review of Systems  Constitutional: Negative for activity change, appetite change, fatigue and fever.  HENT: Positive for hearing loss. Negative for congestion, dental problem, drooling, ear discharge, ear pain, facial swelling, mouth sores, nosebleeds, postnasal drip, rhinorrhea, sinus pressure, sinus pain, sore throat and voice change.   Eyes:  Negative for visual disturbance.  Respiratory: Negative for cough and shortness of breath.   Gastrointestinal: Negative for abdominal distention, abdominal pain and constipation.  Genitourinary: Negative for difficulty urinating, dysuria and urgency.  Musculoskeletal: Positive for gait problem.  Skin: Negative for color change and pallor.  Neurological: Negative for speech difficulty, weakness, light-headedness and headaches.       Memory lapses.   Psychiatric/Behavioral: Negative for agitation, behavioral  problems and sleep disturbance. The patient is not nervous/anxious.     Immunization History  Administered Date(s) Administered  . Influenza Whole 06/15/2018  . Influenza,inj,Quad PF,6+ Mos 06/25/2013  . Moderna SARS-COVID-2 Vaccination 09/13/2019, 10/11/2019  . Pneumococcal Conjugate-13 03/11/2014  . Pneumococcal Polysaccharide-23 09/11/1986, 09/11/2001, 06/25/2013  . Zoster Recombinat (Shingrix) 09/12/2007   Pertinent  Health Maintenance Due  Topic Date Due  . INFLUENZA VACCINE  04/11/2020  . DEXA SCAN  Completed  . PNA vac Low Risk Adult  Completed   Fall Risk  04/11/2019 10/02/2017  Falls in the past year? 0 Yes  Comment Emmi Telephone Survey: data to providers prior to load -  Number falls in past yr: - 1  Injury with Fall? - No   Functional Status Survey:    Vitals:   12/29/19 1051  BP: 122/70  Pulse: 68  Resp: 20  Temp: (!) 97.5 F (36.4 C)  SpO2: 93%  Weight: 167 lb (75.8 kg)  Height: 4\' 8"  (1.422 m)   Body mass index is 37.44 kg/m. Physical Exam Vitals and nursing note reviewed.  Constitutional:      Appearance: Normal appearance.  HENT:     Head: Normocephalic and atraumatic.     Comments: No TMJ tenderness, no pain when tragus pressed, auricle pulled, no swelling parotid gland noted.     Nose: Nose normal. No congestion or rhinorrhea.     Mouth/Throat:     Mouth: Mucous membranes are moist.  Eyes:     Extraocular Movements: Extraocular movements intact.     Conjunctiva/sclera: Conjunctivae normal.     Pupils: Pupils are equal, round, and reactive to light.  Cardiovascular:     Rate and Rhythm: Normal rate and regular rhythm.     Heart sounds: No murmur.  Pulmonary:     Breath sounds: No wheezing, rhonchi or rales.  Abdominal:     General: Bowel sounds are normal. There is no distension.     Palpations: Abdomen is soft.     Hernia: A hernia is present.     Comments: Umbilical hernia.   Musculoskeletal:     Cervical back: Normal range of motion  and neck supple. No rigidity or tenderness.     Right lower leg: No edema.     Left lower leg: No edema.  Skin:    General: Skin is warm and dry.     Comments: Left parietal hematoma is about a golf ball sized, no pain or s/s of bleeding.   Neurological:     General: No focal deficit present.     Mental Status: She is alert. Mental status is at baseline.     Gait: Gait abnormal.     Comments: Oriented to person, place.   Psychiatric:        Mood and Affect: Mood normal.        Behavior: Behavior normal.     Labs reviewed: Recent Labs    06/14/19 2225 06/26/19 0000 10/14/19 0000  NA 132* 136* 135*  K 4.2 4.0 4.4  CL 100 104 103  CO2 24 25 26*  GLUCOSE 119*  --   --   BUN 16 10 13   CREATININE 0.55 0.6 0.7  CALCIUM 9.4 8.6 8.8   Recent Labs    04/15/19 0000 04/16/19 0000 06/26/19 0000 10/14/19 0000  AST 14  --  14 17  ALT 11  --  13 12  ALKPHOS 55  --  61 67  PROT  --  5.7 5.3*  --   ALBUMIN  --  3.4 3.2 3.3*   Recent Labs    06/14/19 2225 06/14/19 2225 06/17/19 0325 06/26/19 0000 10/14/19 0000  WBC 15.1*   < > 10.2 7.4 7.0  NEUTROABS 11.3*  --   --  3,781 3,234  HGB 13.9   < > 14.5 13.0 14.6  HCT 41.4   < > 41.4 38 43  MCV 98.6  --  97.9  --   --   PLT 183   < > 162 233 167   < > = values in this interval not displayed.   Lab Results  Component Value Date   TSH 2.13 04/15/2019   Lab Results  Component Value Date   HGBA1C 5.7 06/18/2018   Lab Results  Component Value Date   CHOL 188 01/30/2018   HDL 64 01/30/2018   LDLCALC 106 (H) 01/30/2018   TRIG 89 01/30/2018   CHOLHDL 2.9 01/30/2018    Significant Diagnostic Results in last 30 days:  No results found.  Assessment/Plan Left-sided face pain Reported pain and swelling to left side of face under ear 12/27/19, afebrile. No pain or swelling upon my examination today.   Depression, recurrent ([place]) Her mood is stable, continue Escitalopram.   Dementia without behavioral disturbance  ([place]) Continue [facility] for safety, care assistance, close supervision needed, continue Donepezil for memory.     Family/ staff Communication: plan of care reviewed with the patient and charge nurse.   Labs/tests ordered:  none  Time spend 40 minutes.

## 2019-12-30 ENCOUNTER — Encounter: Payer: Self-pay | Admitting: Nurse Practitioner

## 2020-01-06 ENCOUNTER — Encounter: Payer: Self-pay | Admitting: Internal Medicine

## 2020-01-06 ENCOUNTER — Non-Acute Institutional Stay: Payer: Medicare PPO | Admitting: Internal Medicine

## 2020-01-06 DIAGNOSIS — F339 Major depressive disorder, recurrent, unspecified: Secondary | ICD-10-CM

## 2020-01-06 DIAGNOSIS — E039 Hypothyroidism, unspecified: Secondary | ICD-10-CM | POA: Diagnosis not present

## 2020-01-06 DIAGNOSIS — F0150 Vascular dementia without behavioral disturbance: Secondary | ICD-10-CM | POA: Diagnosis not present

## 2020-01-06 NOTE — Progress Notes (Signed)
Location:  [place] Room Number: [id number] Place of Service:  ALF ([phone]  Provider: Veleta Miners MD   Code Status: DNR Goals of Care:  Advanced Directives 01/06/2020  Does Patient Have a Medical Advance Directive? Yes  Type of Paramedic of [place];Out of facility DNR (pink MOST or yellow form)  Does patient want to make changes to medical advance directive? No - Patient declined  Copy of [place] in Chart? Yes - validated most recent copy scanned in chart (See row information)  Pre-existing out of facility DNR order (yellow form or pink MOST form) Yellow form placed in chart (order not valid for inpatient use);Pink MOST form placed in chart (order not valid for inpatient use)     Chief Complaint  Patient presents with  . Medical Management of Chronic Issues  . Health Maintenance    TDAP    HPI: Patient is a 86 y.o. female seen today for medical management of chronic diseases.   Patient has h/ohypothyroidism, dementia,, sinus bradycardia, prediabetes, hyponatremia, H/o Apical Pneumothorax in 10/20  and  depression with Psychosis  Patient lives in [place] Had no new issues recently [facility] with the walker. NO Falls. Had no Facial Pain . No SOB or cough   Past Medical History:  Diagnosis Date  . Eczema   . Hypothyroid   . Hypothyroidism   . Hypothyroidism   . Vitamin D deficiency     Past Surgical History:  Procedure Laterality Date  . CHOLECYSTECTOMY N/A 06/24/2013   Procedure: LAPAROSCOPIC CHOLECYSTECTOMY WITH INTRAOPERATIVE CHOLANGIOGRAM;  Surgeon: Ralene Ok, MD;  Location: [place];  Service: General;  Laterality: N/A;  . TONSILLECTOMY      No Known Allergies  Outpatient Encounter Medications as of 01/06/2020  Medication Sig  . aspirin EC 81 MG tablet Take 1 tablet (81 mg total) by mouth daily.  . calcium-vitamin D (OSCAL WITH D) 500-200 MG-UNIT tablet Take 1 tablet by mouth  2 (two) times daily. Once daily   . donepezil (ARICEPT) 5 MG tablet Take 1 tablet (5 mg total) by mouth at bedtime.  Marland Kitchen escitalopram (LEXAPRO) 10 MG tablet Take 10 mg by mouth daily.  Marland Kitchen levothyroxine (SYNTHROID, LEVOTHROID) 100 MCG tablet Take 100 mcg by mouth daily before breakfast.   No facility-administered encounter medications on file as of 01/06/2020.    Review of Systems:  Review of Systems  Review of Systems  Constitutional: Negative for activity change, appetite change, chills, diaphoresis, fatigue and fever.  HENT: Negative for mouth sores, postnasal drip, rhinorrhea, sinus pain and sore throat.   Respiratory: Negative for apnea, cough, chest tightness, shortness of breath and wheezing.   Cardiovascular: Negative for chest pain, palpitations and leg swelling.  Gastrointestinal: Negative for abdominal distention, abdominal pain, constipation, diarrhea, nausea and vomiting.  Genitourinary: Negative for dysuria and frequency.  Musculoskeletal: Negative for arthralgias, joint swelling and myalgias.  Skin: Negative for rash.  Neurological: Negative for dizziness, syncope, weakness, light-headedness and numbness.  Psychiatric/Behavioral: Negative for behavioral problems, confusion and sleep disturbance.     Health Maintenance  Topic Date Due  . TETANUS/TDAP  Never done  . INFLUENZA VACCINE  04/11/2020  . DEXA SCAN  Completed  . COVID-19 Vaccine  Completed  . PNA vac Low Risk Adult  Completed    Physical Exam: Vitals:   01/06/20 1506  BP: 122/70  Pulse: 68  Resp: 20  Temp: (!) 97.3 F (36.3 C)  SpO2: 96%  Weight: 167 lb (75.8 kg)  Height: 4\' 8"  (1.422 m)   Body mass index is 37.44 kg/m. Physical Exam  Constitutional: Oriented to person, place, and time. Well-developed and well-nourished.  HENT:  Head: Normocephalic.  Mouth/Throat: Oropharynx is clear and moist.  Eyes: Pupils are equal, round, and reactive to light.  Neck: Neck supple.  Cardiovascular: Normal  rate and normal heart sounds.  No murmur heard. Pulmonary/Chest: Effort normal and breath sounds normal. No respiratory distress. No wheezes. She has no rales.  Abdominal: Soft. Bowel sounds are normal. No distension. There is no tenderness. There is no rebound.  Musculoskeletal: No edema.  Lymphadenopathy: none Neurological: Alert and oriented to person, place, and time.  Skin: Skin is warm and dry.  Psychiatric: Normal mood and affect. Behavior is normal. Thought content normal.    Labs reviewed: Basic Metabolic Panel: Recent Labs    04/15/19 0000 04/16/19 0000 06/14/19 2225 06/26/19 0000 10/14/19 0000  NA 134*   < > 132* 136* 135*  K 4.3   < > 4.2 4.0 4.4  CL  --    < > 100 104 103  CO2  --    < > 24 25 26*  GLUCOSE  --   --  119*  --   --   BUN 13   < > 16 10 13   CREATININE 0.7   < > 0.55 0.6 0.7  CALCIUM  --    < > 9.4 8.6 8.8  TSH 2.13  --   --   --   --    < > = values in this interval not displayed.   Liver Function Tests: Recent Labs    04/15/19 0000 04/16/19 0000 06/26/19 0000 10/14/19 0000  AST 14  --  14 17  ALT 11  --  13 12  ALKPHOS 55  --  61 67  PROT  --  5.7 5.3*  --   ALBUMIN  --  3.4 3.2 3.3*   No results for input(s): LIPASE, AMYLASE in the last 8760 hours. No results for input(s): AMMONIA in the last 8760 hours. CBC: Recent Labs    06/14/19 2225 06/14/19 2225 06/17/19 0325 06/26/19 0000 10/14/19 0000  WBC 15.1*   < > 10.2 7.4 7.0  NEUTROABS 11.3*  --   --  3,781 3,234  HGB 13.9   < > 14.5 13.0 14.6  HCT 41.4   < > 41.4 38 43  MCV 98.6  --  97.9  --   --   PLT 183   < > 162 233 167   < > = values in this interval not displayed.   Lipid Panel: No results for input(s): CHOL, HDL, LDLCALC, TRIG, CHOLHDL, LDLDIRECT in the last 8760 hours. Lab Results  Component Value Date   HGBA1C 5.7 06/18/2018    Procedures since last visit: No results found.  Assessment/Plan Hypothyroidism, unspecified type TSH normal in 8/20 Depression,  recurrent ([facility]) With Psychosis Has been doing well recently On Lexapro  Vascular dementia without behavioral disturbance ([place]) On Aspirin and Aricept H/o Rib fractuere with Pneumotohorax Resolved Hyponatremia Sodium stable Labs/tests ordered:  * No order type specified * Next appt:  Visit date not found

## 2020-03-18 ENCOUNTER — Encounter: Payer: Self-pay | Admitting: Nurse Practitioner

## 2020-03-18 ENCOUNTER — Non-Acute Institutional Stay: Payer: Medicare PPO | Admitting: Nurse Practitioner

## 2020-03-18 DIAGNOSIS — E039 Hypothyroidism, unspecified: Secondary | ICD-10-CM | POA: Diagnosis not present

## 2020-03-18 DIAGNOSIS — F339 Major depressive disorder, recurrent, unspecified: Secondary | ICD-10-CM | POA: Diagnosis not present

## 2020-03-18 DIAGNOSIS — F0150 Vascular dementia without behavioral disturbance: Secondary | ICD-10-CM

## 2020-03-18 NOTE — Progress Notes (Signed)
Location:   [place] Room Number: [place] of Service:  ALF [phone]) Provider:  Nyela Cortinas, Lennie Odor NP   [name], MD  Patient Care Team: [name], MD as PCP - General (Internal Medicine) Tonyia Marschall X, NP as Nurse Practitioner (Internal Medicine)  Extended Emergency Contact Information Primary Emergency Contact: Konen,[name] Address: [address]          [place], [place] [address] [name] of [place] Phone: [phone] Mobile Phone: [phone] Relation: Son Secondary Emergency Contact: Biegel,Brett Address: [address]          [place], [place] [address] [name] of [place] Phone: [phone] Work Phone: [phone] Mobile Phone: [phone] Relation: Son  Code Status:  DNR Goals of care: Advanced Directive information Advanced Directives 03/18/2020  Does Patient Have a Medical Advance Directive? Yes  Type of Advance Directive [place]  Does patient want to make changes to medical advance directive? No - Patient declined  Copy of [place] in Chart? Yes - validated most recent copy scanned in chart (See row information)  Pre-existing out of facility DNR order (yellow form or pink MOST form) -     Chief Complaint  Patient presents with  . Medical Management of Chronic Issues    HPI:  Pt is a 86 y.o. female seen today for medical management of chronic diseases.    Hx of dementia, resides in AL [facility] for safety, care assistance, on Donepezil 5mg  qd for memory.   Her mood is stable, on Escitalopram 10mg  qd.   Hypothyroidism, takes Levothyroxine 165mcg qd.    Past Medical History:  Diagnosis Date  . Eczema   . Hypothyroid   . Hypothyroidism   . Hypothyroidism   . Vitamin D deficiency    Past Surgical History:  Procedure Laterality Date  . CHOLECYSTECTOMY N/A 06/24/2013   Procedure: LAPAROSCOPIC CHOLECYSTECTOMY WITH INTRAOPERATIVE CHOLANGIOGRAM;  Surgeon: Ralene Ok, MD;  Location: [place];  Service: General;  Laterality: N/A;  . TONSILLECTOMY      No Known Allergies  Allergies as of 03/18/2020   No Known Allergies     Medication List       Accurate as of March 18, 2020 11:59 PM. If you have any questions, ask your nurse or doctor.        aspirin EC 81 MG tablet Take 1 tablet (81 mg total) by mouth daily.   calcium-vitamin D 500-200 MG-UNIT tablet Commonly known as: OSCAL WITH D Take 1 tablet by mouth 2 (two) times daily. Once daily   donepezil 5 MG tablet Commonly known as: ARICEPT Take 1 tablet (5 mg total) by mouth at bedtime.   escitalopram 10 MG tablet Commonly known as: LEXAPRO Take 10 mg by mouth daily.   levothyroxine 100 MCG tablet Commonly known as: SYNTHROID Take 100 mcg by mouth daily before breakfast.       Review of Systems  Constitutional: Negative for fatigue, fever and unexpected weight change.  HENT: Positive for hearing loss. Negative for congestion and voice change.   Eyes: Negative for visual disturbance.  Respiratory: Negative for cough and shortness of breath.   Gastrointestinal: Negative for abdominal pain and constipation.  Genitourinary: Negative for difficulty urinating, dysuria and urgency.  Musculoskeletal: Positive for gait problem.  Skin: Negative for color change.  Neurological: Negative for dizziness, speech difficulty and weakness.       Memory lapses.   Psychiatric/Behavioral: Negative for behavioral problems and  sleep disturbance. The patient is not nervous/anxious.     Immunization History  Administered Date(s) Administered  . Influenza Whole 06/15/2018  . Influenza,inj,Quad PF,6+ Mos 06/25/2013  . Moderna SARS-COVID-2 Vaccination 09/13/2019, 10/11/2019  . Pneumococcal Conjugate-13 03/11/2014  . Pneumococcal Polysaccharide-23 09/11/1986, 09/11/2001, 06/25/2013  . Zoster Recombinat (Shingrix) 09/12/2007   Pertinent  Health Maintenance Due  Topic Date Due  . INFLUENZA VACCINE   04/11/2020  . DEXA SCAN  Completed  . PNA vac Low Risk Adult  Completed   Fall Risk  04/11/2019 10/02/2017  Falls in the past year? 0 Yes  Comment Emmi Telephone Survey: data to providers prior to load -  Number falls in past yr: - 1  Injury with Fall? - No   Functional Status Survey:    Vitals:   03/18/20 1034  BP: 112/72  Pulse: 62  Temp: 97.9 F (36.6 C)  Weight: 162 lb 9.6 oz (73.8 kg)  Height: 4\' 8"  (1.422 m)   Body mass index is 36.45 kg/m. Physical Exam Vitals and nursing note reviewed.  Constitutional:      Appearance: Normal appearance.  HENT:     Head: Normocephalic and atraumatic.     Comments: No TMJ tenderness, no pain when tragus pressed, auricle pulled, no swelling parotid gland noted.     Nose: Nose normal.     Mouth/Throat:     Mouth: Mucous membranes are moist.  Eyes:     Extraocular Movements: Extraocular movements intact.     Conjunctiva/sclera: Conjunctivae normal.     Pupils: Pupils are equal, round, and reactive to light.  Cardiovascular:     Rate and Rhythm: Normal rate and regular rhythm.     Heart sounds: No murmur heard.   Pulmonary:     Breath sounds: No rales.  Abdominal:     General: Bowel sounds are normal.     Palpations: Abdomen is soft.     Tenderness: There is no abdominal tenderness.     Hernia: A hernia is present.     Comments: Umbilical hernia.   Musculoskeletal:     Cervical back: Normal range of motion and neck supple.     Right lower leg: No edema.     Left lower leg: No edema.  Skin:    General: Skin is warm and dry.     Comments: Left parietal hematoma is about a golf ball sized, no pain or s/s of bleeding.   Neurological:     General: No focal deficit present.     Mental Status: She is alert. Mental status is at baseline.     Gait: Gait abnormal.     Comments: Oriented to person, place.   Psychiatric:        Mood and Affect: Mood normal.        Behavior: Behavior normal.     Labs reviewed: Recent Labs     06/14/19 2225 06/26/19 0000 10/14/19 0000  NA 132* 136* 135*  K 4.2 4.0 4.4  CL 100 104 103  CO2 24 25 26*  GLUCOSE 119*  --   --   BUN 16 10 13   CREATININE 0.55 0.6 0.7  CALCIUM 9.4 8.6 8.8   Recent Labs    04/15/19 0000 04/16/19 0000 06/26/19 0000 10/14/19 0000  AST 14  --  14 17  ALT 11  --  13 12  ALKPHOS 55  --  61 67  PROT  --  5.7 5.3*  --   ALBUMIN  --  3.4 3.2 3.3*   Recent Labs    06/14/19 2225 06/14/19 2225 06/17/19 0325 06/26/19 0000 10/14/19 0000  WBC 15.1*   < > 10.2 7.4 7.0  NEUTROABS 11.3*  --   --  3,781 3,234  HGB 13.9   < > 14.5 13.0 14.6  HCT 41.4   < > 41.4 38 43  MCV 98.6  --  97.9  --   --   PLT 183   < > 162 233 167   < > = values in this interval not displayed.   Lab Results  Component Value Date   TSH 2.13 04/15/2019   Lab Results  Component Value Date   HGBA1C 5.7 06/18/2018   Lab Results  Component Value Date   CHOL 188 01/30/2018   HDL 64 01/30/2018   LDLCALC 106 (H) 01/30/2018   TRIG 89 01/30/2018   CHOLHDL 2.9 01/30/2018    Significant Diagnostic Results in last 30 days:  No results found.  Assessment/Plan Hypothyroidism Stable, TSH 2.13 04/15/19, continue Levothyroxine.   Dementia without behavioral disturbance (HCC) Functioning adequately in AL [facility], continue Donepezil for memory.   Depression, recurrent ([place]) Her mood is stable, continue Escitalopram.      Family/ staff Communication: plan of care reviewed with the patient and charge nurse.   Labs/tests ordered: none  Time spend 40 minutes.

## 2020-03-19 ENCOUNTER — Encounter: Payer: Self-pay | Admitting: Nurse Practitioner

## 2020-03-19 NOTE — Assessment & Plan Note (Signed)
Functioning adequately in AL [facility], continue Donepezil for memory.

## 2020-03-19 NOTE — Assessment & Plan Note (Signed)
Her mood is stable, continue Escitalopram.

## 2020-03-19 NOTE — Assessment & Plan Note (Signed)
Stable, TSH 2.13 04/15/19, continue Levothyroxine.

## 2020-06-01 DIAGNOSIS — I10 Essential (primary) hypertension: Secondary | ICD-10-CM | POA: Diagnosis not present

## 2020-06-02 LAB — TSH: TSH: 0.92 (ref 0.41–5.90)

## 2020-06-14 DIAGNOSIS — M6281 Muscle weakness (generalized): Secondary | ICD-10-CM | POA: Diagnosis not present

## 2020-06-14 DIAGNOSIS — Z9181 History of falling: Secondary | ICD-10-CM | POA: Diagnosis not present

## 2020-06-14 DIAGNOSIS — R29898 Other symptoms and signs involving the musculoskeletal system: Secondary | ICD-10-CM | POA: Diagnosis not present

## 2020-06-14 DIAGNOSIS — R5383 Other fatigue: Secondary | ICD-10-CM | POA: Diagnosis not present

## 2020-06-14 DIAGNOSIS — R2681 Unsteadiness on feet: Secondary | ICD-10-CM | POA: Diagnosis not present

## 2020-06-14 DIAGNOSIS — R531 Weakness: Secondary | ICD-10-CM | POA: Diagnosis not present

## 2020-06-16 DIAGNOSIS — M6281 Muscle weakness (generalized): Secondary | ICD-10-CM | POA: Diagnosis not present

## 2020-06-16 DIAGNOSIS — Z9181 History of falling: Secondary | ICD-10-CM | POA: Diagnosis not present

## 2020-06-16 DIAGNOSIS — R2681 Unsteadiness on feet: Secondary | ICD-10-CM | POA: Diagnosis not present

## 2020-06-16 DIAGNOSIS — R531 Weakness: Secondary | ICD-10-CM | POA: Diagnosis not present

## 2020-06-16 DIAGNOSIS — R29898 Other symptoms and signs involving the musculoskeletal system: Secondary | ICD-10-CM | POA: Diagnosis not present

## 2020-06-16 DIAGNOSIS — R5383 Other fatigue: Secondary | ICD-10-CM | POA: Diagnosis not present

## 2020-06-17 DIAGNOSIS — M6281 Muscle weakness (generalized): Secondary | ICD-10-CM | POA: Diagnosis not present

## 2020-06-17 DIAGNOSIS — R2681 Unsteadiness on feet: Secondary | ICD-10-CM | POA: Diagnosis not present

## 2020-06-17 DIAGNOSIS — R5383 Other fatigue: Secondary | ICD-10-CM | POA: Diagnosis not present

## 2020-06-17 DIAGNOSIS — R29898 Other symptoms and signs involving the musculoskeletal system: Secondary | ICD-10-CM | POA: Diagnosis not present

## 2020-06-17 DIAGNOSIS — Z9181 History of falling: Secondary | ICD-10-CM | POA: Diagnosis not present

## 2020-06-17 DIAGNOSIS — R531 Weakness: Secondary | ICD-10-CM | POA: Diagnosis not present

## 2020-06-18 DIAGNOSIS — R2681 Unsteadiness on feet: Secondary | ICD-10-CM | POA: Diagnosis not present

## 2020-06-18 DIAGNOSIS — R29898 Other symptoms and signs involving the musculoskeletal system: Secondary | ICD-10-CM | POA: Diagnosis not present

## 2020-06-18 DIAGNOSIS — Z9181 History of falling: Secondary | ICD-10-CM | POA: Diagnosis not present

## 2020-06-18 DIAGNOSIS — R531 Weakness: Secondary | ICD-10-CM | POA: Diagnosis not present

## 2020-06-18 DIAGNOSIS — R5383 Other fatigue: Secondary | ICD-10-CM | POA: Diagnosis not present

## 2020-06-18 DIAGNOSIS — M6281 Muscle weakness (generalized): Secondary | ICD-10-CM | POA: Diagnosis not present

## 2020-06-22 DIAGNOSIS — Z9181 History of falling: Secondary | ICD-10-CM | POA: Diagnosis not present

## 2020-06-22 DIAGNOSIS — R531 Weakness: Secondary | ICD-10-CM | POA: Diagnosis not present

## 2020-06-22 DIAGNOSIS — R5383 Other fatigue: Secondary | ICD-10-CM | POA: Diagnosis not present

## 2020-06-22 DIAGNOSIS — M79671 Pain in right foot: Secondary | ICD-10-CM | POA: Diagnosis not present

## 2020-06-22 DIAGNOSIS — M6281 Muscle weakness (generalized): Secondary | ICD-10-CM | POA: Diagnosis not present

## 2020-06-22 DIAGNOSIS — R29898 Other symptoms and signs involving the musculoskeletal system: Secondary | ICD-10-CM | POA: Diagnosis not present

## 2020-06-22 DIAGNOSIS — R2681 Unsteadiness on feet: Secondary | ICD-10-CM | POA: Diagnosis not present

## 2020-06-22 DIAGNOSIS — B351 Tinea unguium: Secondary | ICD-10-CM | POA: Diagnosis not present

## 2020-06-22 DIAGNOSIS — M79672 Pain in left foot: Secondary | ICD-10-CM | POA: Diagnosis not present

## 2020-06-23 DIAGNOSIS — R5383 Other fatigue: Secondary | ICD-10-CM | POA: Diagnosis not present

## 2020-06-23 DIAGNOSIS — R531 Weakness: Secondary | ICD-10-CM | POA: Diagnosis not present

## 2020-06-23 DIAGNOSIS — M6281 Muscle weakness (generalized): Secondary | ICD-10-CM | POA: Diagnosis not present

## 2020-06-23 DIAGNOSIS — Z9181 History of falling: Secondary | ICD-10-CM | POA: Diagnosis not present

## 2020-06-23 DIAGNOSIS — R29898 Other symptoms and signs involving the musculoskeletal system: Secondary | ICD-10-CM | POA: Diagnosis not present

## 2020-06-23 DIAGNOSIS — R2681 Unsteadiness on feet: Secondary | ICD-10-CM | POA: Diagnosis not present

## 2020-06-24 DIAGNOSIS — R5383 Other fatigue: Secondary | ICD-10-CM | POA: Diagnosis not present

## 2020-06-24 DIAGNOSIS — R531 Weakness: Secondary | ICD-10-CM | POA: Diagnosis not present

## 2020-06-24 DIAGNOSIS — R29898 Other symptoms and signs involving the musculoskeletal system: Secondary | ICD-10-CM | POA: Diagnosis not present

## 2020-06-24 DIAGNOSIS — R2681 Unsteadiness on feet: Secondary | ICD-10-CM | POA: Diagnosis not present

## 2020-06-24 DIAGNOSIS — Z9181 History of falling: Secondary | ICD-10-CM | POA: Diagnosis not present

## 2020-06-24 DIAGNOSIS — M6281 Muscle weakness (generalized): Secondary | ICD-10-CM | POA: Diagnosis not present

## 2020-06-25 DIAGNOSIS — Z9181 History of falling: Secondary | ICD-10-CM | POA: Diagnosis not present

## 2020-06-25 DIAGNOSIS — R5383 Other fatigue: Secondary | ICD-10-CM | POA: Diagnosis not present

## 2020-06-25 DIAGNOSIS — M6281 Muscle weakness (generalized): Secondary | ICD-10-CM | POA: Diagnosis not present

## 2020-06-25 DIAGNOSIS — R29898 Other symptoms and signs involving the musculoskeletal system: Secondary | ICD-10-CM | POA: Diagnosis not present

## 2020-06-25 DIAGNOSIS — R2681 Unsteadiness on feet: Secondary | ICD-10-CM | POA: Diagnosis not present

## 2020-06-25 DIAGNOSIS — R531 Weakness: Secondary | ICD-10-CM | POA: Diagnosis not present

## 2020-06-28 DIAGNOSIS — R5383 Other fatigue: Secondary | ICD-10-CM | POA: Diagnosis not present

## 2020-06-28 DIAGNOSIS — M6281 Muscle weakness (generalized): Secondary | ICD-10-CM | POA: Diagnosis not present

## 2020-06-28 DIAGNOSIS — Z9181 History of falling: Secondary | ICD-10-CM | POA: Diagnosis not present

## 2020-06-28 DIAGNOSIS — R531 Weakness: Secondary | ICD-10-CM | POA: Diagnosis not present

## 2020-06-28 DIAGNOSIS — R29898 Other symptoms and signs involving the musculoskeletal system: Secondary | ICD-10-CM | POA: Diagnosis not present

## 2020-06-28 DIAGNOSIS — R2681 Unsteadiness on feet: Secondary | ICD-10-CM | POA: Diagnosis not present

## 2020-06-29 ENCOUNTER — Non-Acute Institutional Stay: Payer: Medicare PPO | Admitting: Internal Medicine

## 2020-06-29 ENCOUNTER — Encounter: Payer: Self-pay | Admitting: Internal Medicine

## 2020-06-29 DIAGNOSIS — E871 Hypo-osmolality and hyponatremia: Secondary | ICD-10-CM | POA: Diagnosis not present

## 2020-06-29 DIAGNOSIS — F0150 Vascular dementia without behavioral disturbance: Secondary | ICD-10-CM | POA: Diagnosis not present

## 2020-06-29 DIAGNOSIS — F339 Major depressive disorder, recurrent, unspecified: Secondary | ICD-10-CM | POA: Diagnosis not present

## 2020-06-29 DIAGNOSIS — E039 Hypothyroidism, unspecified: Secondary | ICD-10-CM

## 2020-06-29 NOTE — Progress Notes (Signed)
Location:   [place] Room Number: [place] of Service:  ALF [phone]) Provider:  Veleta Miners MD  [name], MD  Patient Care Team: [name], MD as PCP - General (Internal Medicine) Mast, Man X, NP as Nurse Practitioner (Internal Medicine)  Extended Emergency Contact Information Primary Emergency Contact: Cassedy,[name] Address: [address]          [place], [place] [address] [name] of [place] Phone: [phone] Mobile Phone: [phone] Relation: Son Secondary Emergency Contact: Cales,Brett Address: [address]          [place], [place] [address] [name] of [place] Phone: [phone] Work Phone: [phone] Mobile Phone: [phone] Relation: Son  Code Status:  DNR Goals of care: Advanced Directive information Advanced Directives 03/18/2020  Does Patient Have a Medical Advance Directive? Yes  Type of Advance Directive [place]  Does patient want to make changes to medical advance directive? No - Patient declined  Copy of [place] in Chart? Yes - validated most recent copy scanned in chart (See row information)  Pre-existing out of facility DNR order (yellow form or pink MOST form) -     Chief Complaint  Patient presents with  . Medical Management of Chronic Issues    HPI:  Pt is a 86 y.o. female seen today for medical management of chronic diseases.    Patient has h/ohypothyroidism, dementia,, sinus bradycardia, prediabetes, hyponatremia, H/o Apical Pneumothorax in 10/20  and  depressionwith Psychosis  Patient doing well in AL. Walks with her walker. Mood is good. Weight is stable No Falls No New Nursing issues  Past Medical History:  Diagnosis Date  . Eczema   . Hypothyroid   . Hypothyroidism   . Hypothyroidism   . Vitamin D deficiency    Past Surgical History:  Procedure Laterality Date  . CHOLECYSTECTOMY N/A 06/24/2013   Procedure:  LAPAROSCOPIC CHOLECYSTECTOMY WITH INTRAOPERATIVE CHOLANGIOGRAM;  Surgeon: Ralene Ok, MD;  Location: [facility];  Service: General;  Laterality: N/A;  . TONSILLECTOMY      No Known Allergies  Allergies as of 06/29/2020   No Known Allergies     Medication List       Accurate as of June 29, 2020  2:48 PM. If you have any questions, ask your nurse or doctor.        aspirin EC 81 MG tablet Take 1 tablet (81 mg total) by mouth daily.   calcium-vitamin D 500-200 MG-UNIT tablet Commonly known as: OSCAL WITH D Take 1 tablet by mouth 2 (two) times daily. Once daily   donepezil 5 MG tablet Commonly known as: ARICEPT Take 1 tablet (5 mg total) by mouth at bedtime.   escitalopram 10 MG tablet Commonly known as: LEXAPRO Take 10 mg by mouth daily.   levothyroxine 100 MCG tablet Commonly known as: SYNTHROID Take 100 mcg by mouth daily before breakfast.   melatonin 1 MG Tabs tablet Take 1 mg by mouth at bedtime.       Review of Systems  Review of Systems  Constitutional: Negative for activity change, appetite change, chills, diaphoresis, fatigue and fever.  HENT: Negative for mouth sores, postnasal drip, rhinorrhea, sinus pain and sore throat.   Respiratory: Negative for apnea, cough, chest tightness, shortness of breath and wheezing.   Cardiovascular: Negative for chest pain, palpitations and leg swelling.  Gastrointestinal: Negative for abdominal distention, abdominal pain, constipation, diarrhea, nausea and vomiting.  Genitourinary: Negative for dysuria and  frequency.  Musculoskeletal: Negative for arthralgias, joint swelling and myalgias.  Skin: Negative for rash.  Neurological: Negative for dizziness, syncope, weakness, light-headedness and numbness.  Psychiatric/Behavioral: Negative for behavioral problems, confusion and sleep disturbance.     Immunization History  Administered Date(s) Administered  . Influenza Whole 06/15/2018  . Influenza,inj,Quad PF,6+ Mos  06/25/2013  . Influenza-Unspecified 06/23/2020  . Moderna SARS-COVID-2 Vaccination 09/13/2019, 10/11/2019  . Pneumococcal Conjugate-13 03/11/2014  . Pneumococcal Polysaccharide-23 09/11/1986, 09/11/2001, 06/25/2013  . Tdap 03/16/2011  . Zoster Recombinat (Shingrix) 09/12/2007   Pertinent  Health Maintenance Due  Topic Date Due  . INFLUENZA VACCINE  Completed  . DEXA SCAN  Completed  . PNA vac Low Risk Adult  Completed   Fall Risk  04/11/2019 10/02/2017  Falls in the past year? 0 Yes  Comment Emmi Telephone Survey: data to providers prior to load -  Number falls in past yr: - 1  Injury with Fall? - No   Functional Status Survey:    Vitals:   06/29/20 1442  BP: (!) 144/70  Pulse: 68  Resp: 18  Temp: 98.2 F (36.8 C)  SpO2: 93%  Weight: 165 lb (74.8 kg)  Height: 4\' 8"  (1.422 m)   Body mass index is 36.99 kg/m. Physical Exam  Constitutional: Oriented to person, place, and time. Well-developed and well-nourished.  HENT:  Head: Normocephalic.  Mouth/Throat: Oropharynx is clear and moist.  Eyes: Pupils are equal, round, and reactive to light.  Neck: Neck supple.  Cardiovascular: Normal rate and normal heart sounds.  No murmur heard. Pulmonary/Chest: Effort normal and breath sounds normal. No respiratory distress. No wheezes. She has no rales.  Abdominal: Soft. Bowel sounds are normal. No distension. There is no tenderness. There is no rebound.  Musculoskeletal: No edema.  Lymphadenopathy: none Neurological: Alert and oriented to person, place, and time.  Skin: Skin is warm and dry.  Psychiatric: Normal mood and affect. Behavior is normal. Thought content normal.    Labs reviewed: Recent Labs    10/14/19 0000  NA 135*  K 4.4  CL 103  CO2 26*  BUN 13  CREATININE 0.7  CALCIUM 8.8   Recent Labs    10/14/19 0000  AST 17  ALT 12  ALKPHOS 67  ALBUMIN 3.3*   Recent Labs    10/14/19 0000  WBC 7.0  NEUTROABS 3,234  HGB 14.6  HCT 43  PLT 167   Lab  Results  Component Value Date   TSH 0.92 06/02/2020   Lab Results  Component Value Date   HGBA1C 5.7 06/18/2018   Lab Results  Component Value Date   CHOL 188 01/30/2018   HDL 64 01/30/2018   LDLCALC 106 (H) 01/30/2018   TRIG 89 01/30/2018   CHOLHDL 2.9 01/30/2018    Significant Diagnostic Results in last 30 days:  No results found.  Assessment/Plan  Hypothyroidism, unspecified type TSH Normal Repeat TSH  Vascular dementia without behavioral disturbance (HCC) On Aspirin and Aricept Depression, recurrent (HCC) On Lexapro Hyponatremia Repeat BMP    Family/ staff Communication:   Labs/tests ordered:  CBC,BMP,TSH

## 2020-06-30 DIAGNOSIS — R5383 Other fatigue: Secondary | ICD-10-CM | POA: Diagnosis not present

## 2020-06-30 DIAGNOSIS — M6281 Muscle weakness (generalized): Secondary | ICD-10-CM | POA: Diagnosis not present

## 2020-06-30 DIAGNOSIS — Z9181 History of falling: Secondary | ICD-10-CM | POA: Diagnosis not present

## 2020-06-30 DIAGNOSIS — R2681 Unsteadiness on feet: Secondary | ICD-10-CM | POA: Diagnosis not present

## 2020-06-30 DIAGNOSIS — R29898 Other symptoms and signs involving the musculoskeletal system: Secondary | ICD-10-CM | POA: Diagnosis not present

## 2020-06-30 DIAGNOSIS — R531 Weakness: Secondary | ICD-10-CM | POA: Diagnosis not present

## 2020-07-01 DIAGNOSIS — Z9181 History of falling: Secondary | ICD-10-CM | POA: Diagnosis not present

## 2020-07-01 DIAGNOSIS — R29898 Other symptoms and signs involving the musculoskeletal system: Secondary | ICD-10-CM | POA: Diagnosis not present

## 2020-07-01 DIAGNOSIS — I10 Essential (primary) hypertension: Secondary | ICD-10-CM | POA: Diagnosis not present

## 2020-07-01 DIAGNOSIS — M6281 Muscle weakness (generalized): Secondary | ICD-10-CM | POA: Diagnosis not present

## 2020-07-01 DIAGNOSIS — R2681 Unsteadiness on feet: Secondary | ICD-10-CM | POA: Diagnosis not present

## 2020-07-01 DIAGNOSIS — R5383 Other fatigue: Secondary | ICD-10-CM | POA: Diagnosis not present

## 2020-07-01 DIAGNOSIS — R531 Weakness: Secondary | ICD-10-CM | POA: Diagnosis not present

## 2020-07-02 DIAGNOSIS — Z9181 History of falling: Secondary | ICD-10-CM | POA: Diagnosis not present

## 2020-07-02 DIAGNOSIS — R531 Weakness: Secondary | ICD-10-CM | POA: Diagnosis not present

## 2020-07-02 DIAGNOSIS — R2681 Unsteadiness on feet: Secondary | ICD-10-CM | POA: Diagnosis not present

## 2020-07-02 DIAGNOSIS — M6281 Muscle weakness (generalized): Secondary | ICD-10-CM | POA: Diagnosis not present

## 2020-07-02 DIAGNOSIS — R29898 Other symptoms and signs involving the musculoskeletal system: Secondary | ICD-10-CM | POA: Diagnosis not present

## 2020-07-02 DIAGNOSIS — R5383 Other fatigue: Secondary | ICD-10-CM | POA: Diagnosis not present

## 2020-07-02 LAB — COMPREHENSIVE METABOLIC PANEL
Albumin: 3.3 — AB (ref 3.5–5.0)
Calcium: 9 (ref 8.7–10.7)
Globulin: 2.1

## 2020-07-02 LAB — BASIC METABOLIC PANEL
BUN: 10 (ref 4–21)
CO2: 23 — AB (ref 13–22)
Chloride: 101 (ref 99–108)
Creatinine: 0.7 (ref 0.5–1.1)
Glucose: 118
Potassium: 4 (ref 3.4–5.3)
Sodium: 134 — AB (ref 137–147)

## 2020-07-02 LAB — CBC AND DIFFERENTIAL
HCT: 46 (ref 36–46)
Hemoglobin: 15.2 (ref 12.0–16.0)
Platelets: 204 (ref 150–399)
WBC: 7.5

## 2020-07-02 LAB — CBC: RBC: 4.57 (ref 3.87–5.11)

## 2020-07-02 LAB — HEPATIC FUNCTION PANEL
ALT: 14 (ref 7–35)
AST: 15 (ref 13–35)
Alkaline Phosphatase: 65 (ref 25–125)
Bilirubin, Total: 0.8

## 2020-07-02 LAB — TSH: TSH: 2.11 (ref 0.41–5.90)

## 2020-07-06 DIAGNOSIS — R29898 Other symptoms and signs involving the musculoskeletal system: Secondary | ICD-10-CM | POA: Diagnosis not present

## 2020-07-06 DIAGNOSIS — R5383 Other fatigue: Secondary | ICD-10-CM | POA: Diagnosis not present

## 2020-07-06 DIAGNOSIS — R2681 Unsteadiness on feet: Secondary | ICD-10-CM | POA: Diagnosis not present

## 2020-07-06 DIAGNOSIS — Z9181 History of falling: Secondary | ICD-10-CM | POA: Diagnosis not present

## 2020-07-06 DIAGNOSIS — M6281 Muscle weakness (generalized): Secondary | ICD-10-CM | POA: Diagnosis not present

## 2020-07-06 DIAGNOSIS — R531 Weakness: Secondary | ICD-10-CM | POA: Diagnosis not present

## 2020-07-07 DIAGNOSIS — R531 Weakness: Secondary | ICD-10-CM | POA: Diagnosis not present

## 2020-07-07 DIAGNOSIS — R2681 Unsteadiness on feet: Secondary | ICD-10-CM | POA: Diagnosis not present

## 2020-07-07 DIAGNOSIS — M6281 Muscle weakness (generalized): Secondary | ICD-10-CM | POA: Diagnosis not present

## 2020-07-07 DIAGNOSIS — R29898 Other symptoms and signs involving the musculoskeletal system: Secondary | ICD-10-CM | POA: Diagnosis not present

## 2020-07-07 DIAGNOSIS — R5383 Other fatigue: Secondary | ICD-10-CM | POA: Diagnosis not present

## 2020-07-07 DIAGNOSIS — Z9181 History of falling: Secondary | ICD-10-CM | POA: Diagnosis not present

## 2020-07-08 DIAGNOSIS — M6281 Muscle weakness (generalized): Secondary | ICD-10-CM | POA: Diagnosis not present

## 2020-07-08 DIAGNOSIS — R5383 Other fatigue: Secondary | ICD-10-CM | POA: Diagnosis not present

## 2020-07-08 DIAGNOSIS — R29898 Other symptoms and signs involving the musculoskeletal system: Secondary | ICD-10-CM | POA: Diagnosis not present

## 2020-07-08 DIAGNOSIS — R2681 Unsteadiness on feet: Secondary | ICD-10-CM | POA: Diagnosis not present

## 2020-07-08 DIAGNOSIS — Z9181 History of falling: Secondary | ICD-10-CM | POA: Diagnosis not present

## 2020-07-08 DIAGNOSIS — R531 Weakness: Secondary | ICD-10-CM | POA: Diagnosis not present

## 2020-07-09 DIAGNOSIS — R5383 Other fatigue: Secondary | ICD-10-CM | POA: Diagnosis not present

## 2020-07-09 DIAGNOSIS — Z9181 History of falling: Secondary | ICD-10-CM | POA: Diagnosis not present

## 2020-07-09 DIAGNOSIS — R531 Weakness: Secondary | ICD-10-CM | POA: Diagnosis not present

## 2020-07-09 DIAGNOSIS — R29898 Other symptoms and signs involving the musculoskeletal system: Secondary | ICD-10-CM | POA: Diagnosis not present

## 2020-07-09 DIAGNOSIS — R2681 Unsteadiness on feet: Secondary | ICD-10-CM | POA: Diagnosis not present

## 2020-07-09 DIAGNOSIS — M6281 Muscle weakness (generalized): Secondary | ICD-10-CM | POA: Diagnosis not present

## 2020-08-29 IMAGING — DX DG CHEST 1V PORT
1 series · 1 of 1 positions shown · non-contrast
Comparison: 06/15/2019

CLINICAL DATA: Follow-up left pneumothorax

EXAM:
PORTABLE CHEST 1 VIEW

[chest ap]
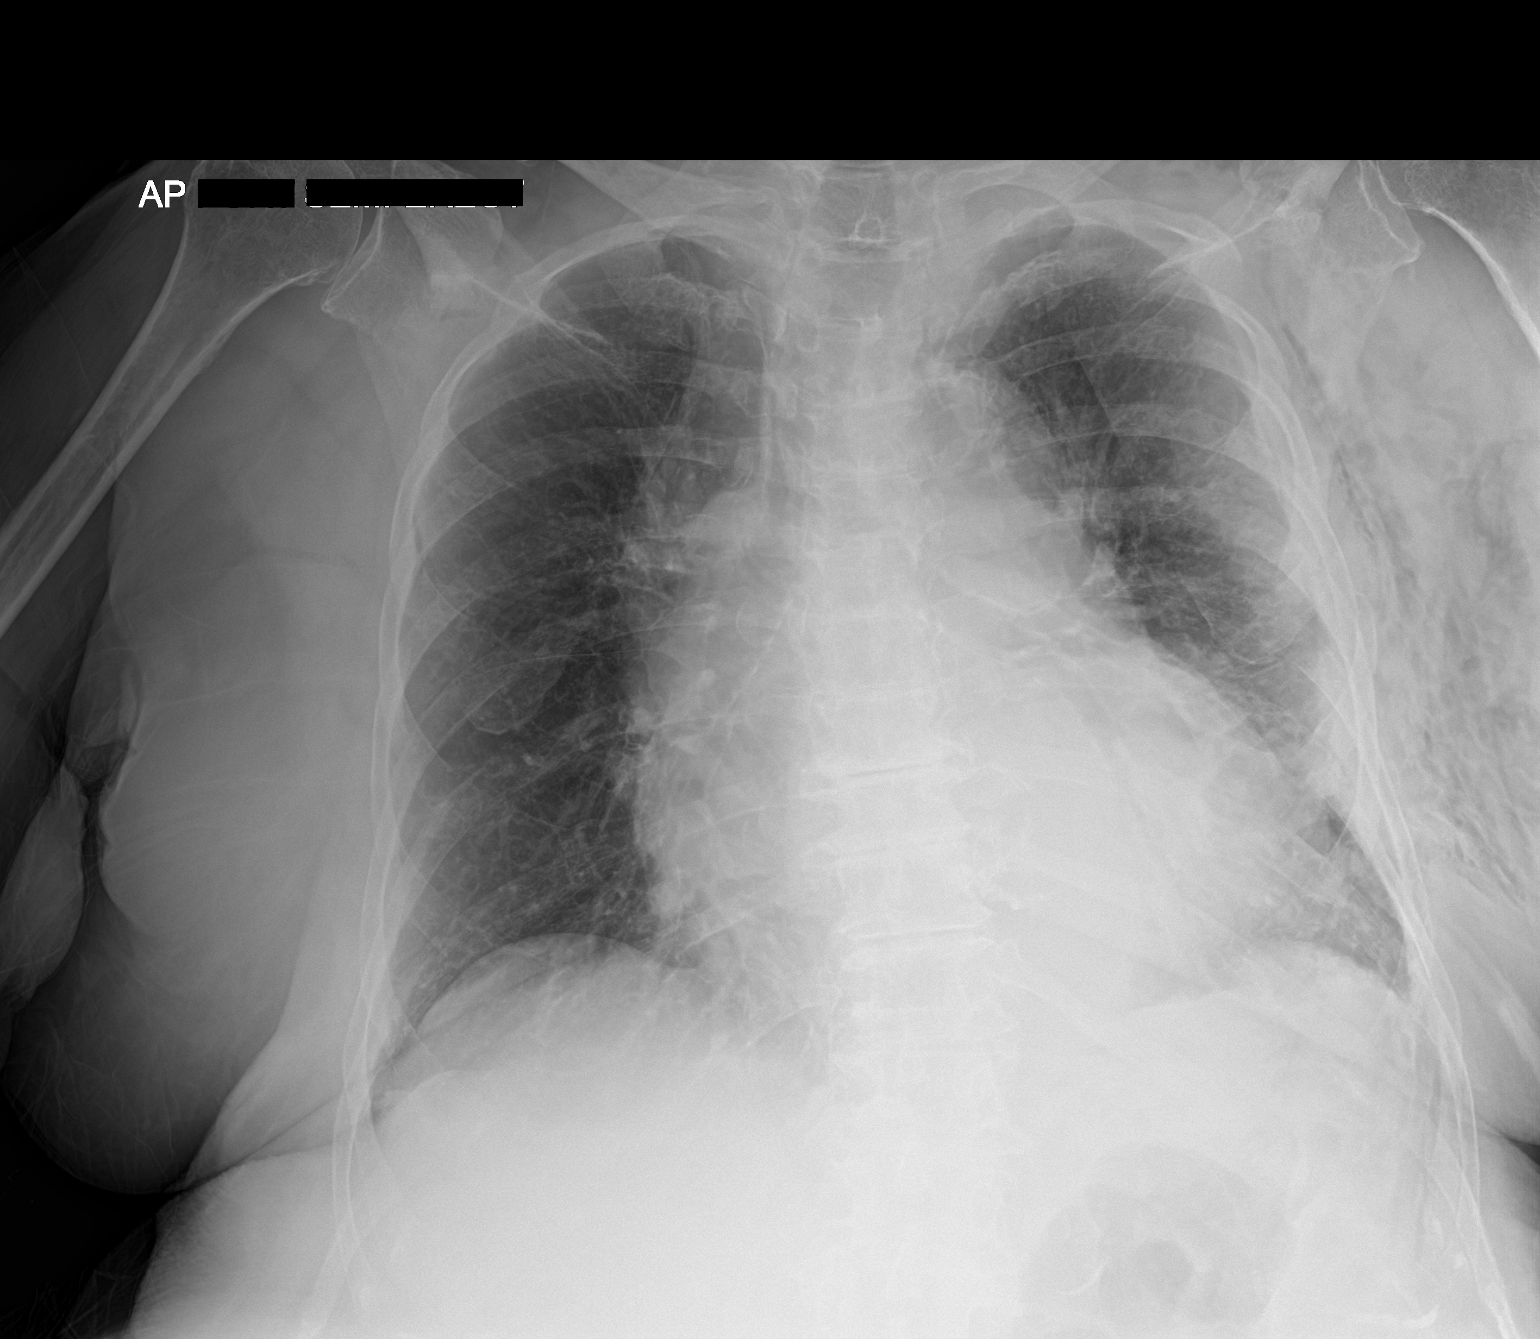

[1 of 1 positions shown; findings below may reference images not displayed]

FINDINGS: Stable small left apical pneumothorax is noted. Considerable
left-sided subcutaneous emphysema is again noted and stable. Left
rib fractures are again seen and stable improved aeration in the
left lung is noted when compare with the prior exam. Right lung
remains clear. Cardiac shadow remains mildly enlarged with aortic
calcifications identified.
IMPRESSION: Improving aeration particularly on the left. Small left apical
pneumothorax remains.

## 2020-09-23 ENCOUNTER — Non-Acute Institutional Stay: Payer: Medicare PPO | Admitting: Nurse Practitioner

## 2020-09-23 DIAGNOSIS — E039 Hypothyroidism, unspecified: Secondary | ICD-10-CM | POA: Diagnosis not present

## 2020-09-23 DIAGNOSIS — F339 Major depressive disorder, recurrent, unspecified: Secondary | ICD-10-CM

## 2020-09-23 DIAGNOSIS — F0150 Vascular dementia without behavioral disturbance: Secondary | ICD-10-CM

## 2020-09-23 NOTE — Progress Notes (Signed)
Location:   [place] Room Number: 353 Place of Service:  ALF (13) Provider: Lennie Odor Andi Mahaffy NP  [name], MD  Patient Care Team: [name], MD as PCP - General (Internal Medicine) Haani Bakula X, NP as Nurse Practitioner (Internal Medicine)  Extended Emergency Contact Information Primary Emergency Contact: Ulbricht,[name] Address: [address]          [place], [place] [address] [name] of [place] Phone: [phone] Mobile Phone: [phone] Relation: Son Secondary Emergency Contact: Scheel,Brett Address: [address]          [place], [place] [address] [name] of [place] Phone: [phone] Work Phone: [phone] Mobile Phone: [phone] Relation: Son  Code Status:  DNR Goals of care: Advanced Directive information Advanced Directives 03/18/2020  Does Patient Have a Medical Advance Directive? Yes  Type of Advance Directive [place]  Does patient want to make changes to medical advance directive? No - Patient declined  Copy of [place] in Chart? Yes - validated most recent copy scanned in chart (See row information)  Pre-existing out of facility DNR order (yellow form or pink MOST form) -     Chief Complaint  Patient presents with  . Medical Management of Chronic Issues    HPI:  Pt is a 87 y.o. female seen today for medical management of chronic diseases.      Hx of dementia, resides in AL [facility] for safety, care assistance, on Donepezil 5mg  qd for memory.              Her mood is stable, on Escitalopram 10mg  qd.             Hypothyroidism, takes Levothyroxine 191mcg qd.   Past Medical History:  Diagnosis Date  . Eczema   . Hypothyroid   . Hypothyroidism   . Hypothyroidism   . Vitamin D deficiency    Past Surgical History:  Procedure Laterality Date  . CHOLECYSTECTOMY N/A 06/24/2013   Procedure: LAPAROSCOPIC CHOLECYSTECTOMY WITH INTRAOPERATIVE CHOLANGIOGRAM;  Surgeon: Ralene Ok, MD;  Location: [facility];  Service: General;  Laterality: N/A;  . TONSILLECTOMY      No Known Allergies  Allergies as of 09/23/2020   No Known Allergies     Medication List       Accurate as of September 23, 2020 11:59 PM. If you have any questions, ask your nurse or doctor.        aspirin EC 81 MG tablet Take 1 tablet (81 mg total) by mouth daily.   calcium-vitamin D 500-200 MG-UNIT tablet Commonly known as: OSCAL WITH D Take 1 tablet by mouth 2 (two) times daily. Once daily   donepezil 5 MG tablet Commonly known as: ARICEPT Take 1 tablet (5 mg total) by mouth at bedtime.   escitalopram 10 MG tablet Commonly known as: LEXAPRO Take 10 mg by mouth daily.   levothyroxine 100 MCG tablet Commonly known as: SYNTHROID Take 100 mcg by mouth daily before breakfast.   melatonin 1 MG Tabs tablet Take 1 mg by mouth at bedtime.       Review of Systems  Constitutional: Negative for fatigue, fever and unexpected weight change.  HENT: Positive for hearing loss. Negative for congestion and voice change.   Eyes: Negative for visual disturbance.  Respiratory: Negative for cough and shortness of breath.   Gastrointestinal: Negative for abdominal pain and constipation.  Genitourinary: Negative for difficulty urinating, dysuria and urgency.  Musculoskeletal: Positive for  gait problem.  Skin: Negative for color change.  Neurological: Negative for speech difficulty, weakness and headaches.       Memory lapses.   Psychiatric/Behavioral: Negative for behavioral problems and sleep disturbance. The patient is not nervous/anxious.     Immunization History  Administered Date(s) Administered  . Influenza Whole 06/15/2018  . Influenza,inj,Quad PF,6+ Mos 06/25/2013  . Influenza-Unspecified 06/23/2020  . Moderna Sars-Covid-2 Vaccination 09/13/2019, 10/11/2019  . Pneumococcal Conjugate-13 03/11/2014  . Pneumococcal Polysaccharide-23 09/11/1986, 09/11/2001, 06/25/2013  . Tdap  03/16/2011  . Zoster Recombinat (Shingrix) 09/12/2007   Pertinent  Health Maintenance Due  Topic Date Due  . INFLUENZA VACCINE  Completed  . DEXA SCAN  Completed  . PNA vac Low Risk Adult  Completed   Fall Risk  04/11/2019 10/02/2017  Falls in the past year? 0 Yes  Comment Emmi Telephone Survey: data to providers prior to load -  Number falls in past yr: - 1  Injury with Fall? - No   Functional Status Survey:    Vitals:   09/23/20 1422  BP: (!) 144/78  Pulse: 62  Resp: 18  Temp: (!) 97.5 F (36.4 C)  SpO2: 92%   There is no height or weight on file to calculate BMI. Physical Exam Vitals and nursing note reviewed.  Constitutional:      Appearance: Normal appearance.  HENT:     Head: Normocephalic and atraumatic.     Comments: No TMJ tenderness, no pain when tragus pressed, auricle pulled, no swelling parotid gland noted.     Mouth/Throat:     Mouth: Mucous membranes are moist.  Eyes:     Extraocular Movements: Extraocular movements intact.     Conjunctiva/sclera: Conjunctivae normal.     Pupils: Pupils are equal, round, and reactive to light.  Cardiovascular:     Rate and Rhythm: Normal rate and regular rhythm.     Heart sounds: No murmur heard.   Pulmonary:     Breath sounds: No rales.  Abdominal:     General: Bowel sounds are normal.     Palpations: Abdomen is soft.     Tenderness: There is no abdominal tenderness.     Hernia: A hernia is present.     Comments: Umbilical hernia.   Musculoskeletal:     Cervical back: Normal range of motion and neck supple.     Right lower leg: No edema.     Left lower leg: No edema.  Skin:    General: Skin is warm and dry.     Comments: Left parietal hematoma is about a golf ball sized, no pain or s/s of bleeding.   Neurological:     General: No focal deficit present.     Mental Status: She is alert. Mental status is at baseline.     Gait: Gait abnormal.     Comments: Oriented to person, place.   Psychiatric:         Mood and Affect: Mood normal.        Behavior: Behavior normal.     Labs reviewed: Recent Labs    10/14/19 0000 07/02/20 0000  NA 135* 134*  K 4.4 4.0  CL 103 101  CO2 26* 23*  BUN 13 10  CREATININE 0.7 0.7  CALCIUM 8.8 9.0   Recent Labs    10/14/19 0000 07/02/20 0000  AST 17 15  ALT 12 14  ALKPHOS 67 65  ALBUMIN 3.3* 3.3*   Recent Labs    10/14/19 0000 07/02/20 0000  WBC 7.0 7.5  NEUTROABS 3,234  --   HGB 14.6 15.2  HCT 43 46  PLT 167 204   Lab Results  Component Value Date   TSH 2.11 07/02/2020   Lab Results  Component Value Date   HGBA1C 5.7 06/18/2018   Lab Results  Component Value Date   CHOL 188 01/30/2018   HDL 64 01/30/2018   LDLCALC 106 (H) 01/30/2018   TRIG 89 01/30/2018   CHOLHDL 2.9 01/30/2018    Significant Diagnostic Results in last 30 days:  No results found.  Assessment/Plan  Hypothyroidism Hypothyroidism, takes Levothyroxine 134mcg qd.  Depression, recurrent ([place]) Her mood is stable, on Escitalopram 10mg  qd.  Dementia without behavioral disturbance ([place]) Hx of dementia, resides in AL [facility] for safety, care assistance, on Donepezil 5mg  qd for memory.     Family/ staff Communication: plan of care reviewed with the patient and charge nurse.   Labs/tests ordered: none  Time spend 40 minutes.

## 2020-09-23 NOTE — Assessment & Plan Note (Signed)
Her mood is stable, on Escitalopram 10mg qd. 

## 2020-09-23 NOTE — Assessment & Plan Note (Signed)
Hypothyroidism, takesLevothyroxine 100mcg qd.  

## 2020-09-23 NOTE — Assessment & Plan Note (Signed)
Hx of dementia, resides in AL [facility] for safety, care assistance, on Donepezil 5mg  qd for memory.

## 2020-09-28 ENCOUNTER — Non-Acute Institutional Stay: Payer: Medicare PPO | Admitting: Internal Medicine

## 2020-09-28 ENCOUNTER — Encounter: Payer: Self-pay | Admitting: Internal Medicine

## 2020-09-28 DIAGNOSIS — L89892 Pressure ulcer of other site, stage 2: Secondary | ICD-10-CM

## 2020-09-28 NOTE — Progress Notes (Signed)
Location: [facility] Room Number: 932 Place of Service:  ALF [phone])  Provider:   Code Status:  Goals of Care:  Advanced Directives 03/18/2020  Does Patient Have a Medical Advance Directive? Yes  Type of Advance Directive [place]  Does patient want to make changes to medical advance directive? No - Patient declined  Copy of [place] in Chart? Yes - validated most recent copy scanned in chart (See row information)  Pre-existing out of facility DNR order (yellow form or pink MOST form) -     Chief Complaint  Patient presents with  . Acute Visit    toe lesion    HPI: Patient is a 87 y.o. female seen today for an acute visit for Painful lesion on her toe  Patient has h/ohypothyroidism, dementia,, sinus bradycardia, prediabetes, hyponatremia, H/o Apical Pneumothorax in 10/20and depressionwith Psychosis  She noticed a painful area in her Right Foot. No H/o Injury or fall No Bleeding Dry Feet   Past Medical History:  Diagnosis Date  . Eczema   . Hypothyroid   . Hypothyroidism   . Hypothyroidism   . Vitamin D deficiency     Past Surgical History:  Procedure Laterality Date  . CHOLECYSTECTOMY N/A 06/24/2013   Procedure: LAPAROSCOPIC CHOLECYSTECTOMY WITH INTRAOPERATIVE CHOLANGIOGRAM;  Surgeon: Ralene Ok, MD;  Location: [facility];  Service: General;  Laterality: N/A;  . TONSILLECTOMY      No Known Allergies  Outpatient Encounter Medications as of 09/28/2020  Medication Sig  . aspirin EC 81 MG tablet Take 1 tablet (81 mg total) by mouth daily.  . calcium-vitamin D (OSCAL WITH D) 500-200 MG-UNIT tablet Take 1 tablet by mouth 2 (two) times daily. Once daily  . donepezil (ARICEPT) 5 MG tablet Take 1 tablet (5 mg total) by mouth at bedtime.  Marland Kitchen escitalopram (LEXAPRO) 10 MG tablet Take 10 mg by mouth daily.  Marland Kitchen levothyroxine (SYNTHROID, LEVOTHROID) 100 MCG tablet Take 100 mcg by mouth daily before breakfast.  .  melatonin 1 MG TABS tablet Take 1 mg by mouth at bedtime.   No facility-administered encounter medications on file as of 09/28/2020.    Review of Systems:  Review of Systems  Review of Systems  Constitutional: Negative for activity change, appetite change, chills, diaphoresis, fatigue and fever.  HENT: Negative for mouth sores, postnasal drip, rhinorrhea, sinus pain and sore throat.   Respiratory: Negative for apnea, cough, chest tightness, shortness of breath and wheezing.   Cardiovascular: Negative for chest pain, palpitations and leg swelling.  Gastrointestinal: Negative for abdominal distention, abdominal pain, constipation, diarrhea, nausea and vomiting.  Genitourinary: Negative for dysuria and frequency.  Musculoskeletal: Negative for arthralgias, joint swelling and myalgias.  Skin: Negative for rash.  Neurological: Negative for dizziness, syncope, weakness, light-headedness and numbness.  Psychiatric/Behavioral: Negative for behavioral problems, confusion and sleep disturbance.     Health Maintenance  Topic Date Due  . COVID-19 Vaccine (3 - Booster for Moderna series) 04/09/2020  . TETANUS/TDAP  03/15/2021  . INFLUENZA VACCINE  Completed  . DEXA SCAN  Completed  . PNA vac Low Risk Adult  Completed    Physical Exam: Vitals:   09/28/20 1626  BP: (!) 144/78  Pulse: 62  Resp: 18  Temp: (!) 97.2 F (36.2 C)  SpO2: 90%  Weight: 162 lb 12.8 oz (73.8 kg)  Height: 4\' 8"  (1.422 m)   Body mass index is 36.5 kg/m. Physical Exam  Constitutional: Oriented to person, place, and  time. Well-developed and well-nourished.  HENT:  Head: Normocephalic.  Mouth/Throat: Oropharynx is clear and moist.  Eyes: Pupils are equal, round, and reactive to light.  Neck: Neck supple.  Cardiovascular: Normal rate and normal heart sounds.  No murmur heard. Pulmonary/Chest: Effort normal and breath sounds normal. No respiratory distress. No wheezes. She has no rales.  Abdominal: Soft. Bowel  sounds are normal. No distension. There is no tenderness. There is no rebound.  Musculoskeletal: No edema.  Lymphadenopathy: none Neurological: Alert and oriented to person, place, and time.  Skin: Skin is warm and dry. Skin on the feet is very dry Has painful Ulcer in between her Fifth and Fourth toe Not red. No Discharge Psychiatric: Normal mood and affect. Behavior is normal. Thought content normal.    Labs reviewed: Basic Metabolic Panel: Recent Labs    10/14/19 0000 06/02/20 0000 07/02/20 0000  NA 135*  --  134*  K 4.4  --  4.0  CL 103  --  101  CO2 26*  --  23*  BUN 13  --  10  CREATININE 0.7  --  0.7  CALCIUM 8.8  --  9.0  TSH  --  0.92 2.11   Liver Function Tests: Recent Labs    10/14/19 0000 07/02/20 0000  AST 17 15  ALT 12 14  ALKPHOS 67 65  ALBUMIN 3.3* 3.3*   No results for input(s): LIPASE, AMYLASE in the last 8760 hours. No results for input(s): AMMONIA in the last 8760 hours. CBC: Recent Labs    10/14/19 0000 07/02/20 0000  WBC 7.0 7.5  NEUTROABS 3,234  --   HGB 14.6 15.2  HCT 43 46  PLT 167 204   Lipid Panel: No results for input(s): CHOL, HDL, LDLCALC, TRIG, CHOLHDL, LDLDIRECT in the last 8760 hours. Lab Results  Component Value Date   HGBA1C 5.7 06/18/2018    Procedures since last visit: No results found.  Assessment/Plan Pressure injury of toe of right foot, stage 2 (HCC) Skin prep and cover with Foam Moisturizer for Feet Hold Podiatry right now  Other issues  Hypothyroidism, unspecified type TSH Normal   Vascular dementia without behavioral disturbance (HCC) On Aspirin and Aricept Depression, recurrent (HCC) On Lexapro Hyponatremia Sodium stable   Labs/tests ordered:  * No order type specified * Next appt:  Visit date not found

## 2020-09-29 ENCOUNTER — Encounter: Payer: Self-pay | Admitting: Nurse Practitioner

## 2020-12-22 ENCOUNTER — Non-Acute Institutional Stay: Payer: Medicare PPO | Admitting: Nurse Practitioner

## 2020-12-22 DIAGNOSIS — R269 Unspecified abnormalities of gait and mobility: Secondary | ICD-10-CM

## 2020-12-22 DIAGNOSIS — F339 Major depressive disorder, recurrent, unspecified: Secondary | ICD-10-CM

## 2020-12-22 DIAGNOSIS — W19XXXA Unspecified fall, initial encounter: Secondary | ICD-10-CM

## 2020-12-22 DIAGNOSIS — F0150 Vascular dementia without behavioral disturbance: Secondary | ICD-10-CM

## 2020-12-22 DIAGNOSIS — E039 Hypothyroidism, unspecified: Secondary | ICD-10-CM

## 2020-12-22 NOTE — Assessment & Plan Note (Signed)
Her mood is stable, on Escitalopram 10mg  qd.

## 2020-12-22 NOTE — Assessment & Plan Note (Signed)
Uses walker for ambulation.

## 2020-12-22 NOTE — Progress Notes (Signed)
Location:   [facility] Room Number: 643 Place of Service:  ALF (13) Provider: Lennie Odor Preethi Scantlebury NP  [name], MD  Patient Care Team: [name], MD as PCP - General (Internal Medicine) Allayna Erlich X, NP as Nurse Practitioner (Internal Medicine)  Extended Emergency Contact Information Primary Emergency Contact: Ciaravino,[name] Address: [address]          [place], [place] [address] [name] of [place] Phone: [phone] Mobile Phone: [phone] Relation: Son Secondary Emergency Contact: Tollison,Brett Address: [address]          [place], [place] [address] [name] of [place] Phone: [phone] Work Phone: [phone] Mobile Phone: [phone] Relation: Son  Code Status: DNR Goals of care: Advanced Directive information Advanced Directives 03/18/2020  Does Patient Have a Medical Advance Directive? Yes  Type of Advance Directive [place]  Does patient want to make changes to medical advance directive? No - Patient declined  Copy of [place] in Chart? Yes - validated most recent copy scanned in chart (See row information)  Pre-existing out of facility DNR order (yellow form or pink MOST form) -     Chief Complaint  Patient presents with  . Acute Visit    fall    HPI:  Pt is a 87 y.o. female seen today for an acute visit for fall 12/20/20 when the patient was found on the floor in her room, no apparent injury, the patient has no recollection of the event.   Gait abnormality, uses walker  Dementia, resides in AL [facility] for safety, care assistance, on Donepezil $RemoveBefo'5mg'uyVxBZtfRXs$  qd for memory.  Her mood is stable, on Escitalopram $RemoveBeforeD'10mg'agFUPObDGdqqat$  qd. Hypothyroidism, takes Levothyroxine 137mcg qd  Past Medical History:  Diagnosis Date  . Eczema   . Hypothyroid   . Hypothyroidism   . Hypothyroidism   . Vitamin D deficiency    Past Surgical History:  Procedure Laterality Date  .  CHOLECYSTECTOMY N/A 06/24/2013   Procedure: LAPAROSCOPIC CHOLECYSTECTOMY WITH INTRAOPERATIVE CHOLANGIOGRAM;  Surgeon: Ralene Ok, MD;  Location: [facility];  Service: General;  Laterality: N/A;  . TONSILLECTOMY      No Known Allergies  Allergies as of 12/22/2020   No Known Allergies     Medication List       Accurate as of December 22, 2020 11:59 PM. If you have any questions, ask your nurse or doctor.        aspirin EC 81 MG tablet Take 1 tablet (81 mg total) by mouth daily.   calcium-vitamin D 500-200 MG-UNIT tablet Commonly known as: OSCAL WITH D Take 1 tablet by mouth 2 (two) times daily. Once daily   donepezil 5 MG tablet Commonly known as: ARICEPT Take 1 tablet (5 mg total) by mouth at bedtime.   escitalopram 10 MG tablet Commonly known as: LEXAPRO Take 10 mg by mouth daily.   levothyroxine 100 MCG tablet Commonly known as: SYNTHROID Take 100 mcg by mouth daily before breakfast.   melatonin 1 MG Tabs tablet Take 1 mg by mouth at bedtime.       Review of Systems  Constitutional: Negative for activity change, fatigue and fever.  HENT: Positive for hearing loss. Negative for congestion and voice change.   Eyes: Negative for visual disturbance.  Respiratory: Negative for cough and shortness of breath.   Gastrointestinal: Negative for abdominal pain and constipation.  Genitourinary: Negative for difficulty urinating, dysuria and urgency.  Musculoskeletal: Positive for gait problem.  Skin: Negative for color change.  Neurological: Negative for speech difficulty, weakness and headaches.       Memory lapses.   Psychiatric/Behavioral: Negative for behavioral problems and sleep disturbance. The patient is not nervous/anxious.     Immunization History  Administered Date(s) Administered  . Influenza Whole 06/15/2018  . Influenza,inj,Quad PF,6+ Mos 06/25/2013  . Influenza-Unspecified 06/23/2020  . Moderna Sars-Covid-2 Vaccination 09/13/2019, 10/11/2019  .  Pneumococcal Conjugate-13 03/11/2014  . Pneumococcal Polysaccharide-23 09/11/1986, 09/11/2001, 06/25/2013  . Tdap 03/16/2011  . Zoster Recombinat (Shingrix) 09/12/2007   Pertinent  Health Maintenance Due  Topic Date Due  . INFLUENZA VACCINE  04/11/2021  . DEXA SCAN  Completed  . PNA vac Low Risk Adult  Completed   Fall Risk  04/11/2019 10/02/2017  Falls in the past year? 0 Yes  Comment Emmi Telephone Survey: data to providers prior to load -  Number falls in past yr: - 1  Injury with Fall? - No   Functional Status Survey:    Vitals:   12/22/20 1153  BP: 130/66  Pulse: 74  Resp: 18  Temp: 98.5 F (36.9 C)  SpO2: 90%   There is no height or weight on file to calculate BMI. Physical Exam Vitals and nursing note reviewed.  Constitutional:      Appearance: Normal appearance.  HENT:     Head: Normocephalic and atraumatic.     Comments: No TMJ tenderness, no pain when tragus pressed, auricle pulled, no swelling parotid gland noted.     Mouth/Throat:     Mouth: Mucous membranes are moist.  Eyes:     Extraocular Movements: Extraocular movements intact.     Conjunctiva/sclera: Conjunctivae normal.     Pupils: Pupils are equal, round, and reactive to light.  Cardiovascular:     Rate and Rhythm: Normal rate and regular rhythm.     Heart sounds: No murmur heard.   Pulmonary:     Breath sounds: No rales.  Abdominal:     General: Bowel sounds are normal.     Palpations: Abdomen is soft.     Tenderness: There is no abdominal tenderness.     Hernia: A hernia is present.     Comments: Umbilical hernia.   Musculoskeletal:     Cervical back: Normal range of motion and neck supple.     Right lower leg: No edema.     Left lower leg: No edema.  Skin:    General: Skin is warm and dry.  Neurological:     General: No focal deficit present.     Mental Status: She is alert. Mental status is at baseline.     Gait: Gait abnormal.     Comments: Oriented to person, place.    Psychiatric:        Mood and Affect: Mood normal.        Behavior: Behavior normal.     Labs reviewed: Recent Labs    07/02/20 0000  NA 134*  K 4.0  CL 101  CO2 23*  BUN 10  CREATININE 0.7  CALCIUM 9.0   Recent Labs    07/02/20 0000  AST 15  ALT 14  ALKPHOS 65  ALBUMIN 3.3*   Recent Labs    07/02/20 0000  WBC 7.5  HGB 15.2  HCT 46  PLT 204   Lab Results  Component Value Date   TSH 2.11 07/02/2020   Lab Results  Component Value Date   HGBA1C 5.7 06/18/2018   Lab Results  Component  Value Date   CHOL 188 01/30/2018   HDL 64 01/30/2018   LDLCALC 106 (H) 01/30/2018   TRIG 89 01/30/2018   CHOLHDL 2.9 01/30/2018    Significant Diagnostic Results in last 30 days:  No results found.  Assessment/Plan: Fall fall 12/20/20 when the patient was found on the floor in her room, no apparent injury, the patient has no recollection of the event. The patient forgets using walker at times, poor safety awareness, and unsteady gait are contributory to her falling. Close supervision/assistance needed for safety.  Update CBC/diff, CMP/eGFR  Gait abnormality Uses walker for ambulation.   Dementia without behavioral disturbance ([place]) resides in [facility] for safety, care assistance, on Donepezil $RemoveBefo'5mg'LefevdjDvey$  qd for memory.    Depression, recurrent ([place]) Her mood is stable, on Escitalopram $RemoveBeforeD'10mg'usBKDYYvnFslHY$  qd.  Hypothyroidism  takes Levothyroxine 145mcg qd     Family/ staff Communication: plan of care reviewed with the patient and charge nurse.   Labs/tests ordered: CBC/diff, CMP/eGFR  Time spend 40 minutes.

## 2020-12-22 NOTE — Assessment & Plan Note (Addendum)
fall 12/20/20 when the patient was found on the floor in her room, no apparent injury, the patient has no recollection of the event. The patient forgets using walker at times, poor safety awareness, and unsteady gait are contributory to her falling. Close supervision/assistance needed for safety.  Update CBC/diff, CMP/eGFR

## 2020-12-22 NOTE — Assessment & Plan Note (Signed)
resides in AL [facility] for safety, care assistance, on Donepezil 5mg  qd for memory.

## 2020-12-22 NOTE — Assessment & Plan Note (Signed)
takes Levothyroxine 164mcg qd

## 2020-12-23 DIAGNOSIS — R296 Repeated falls: Secondary | ICD-10-CM | POA: Diagnosis not present

## 2020-12-24 LAB — HEPATIC FUNCTION PANEL
ALT: 14 (ref 7–35)
AST: 19 (ref 13–35)
Alkaline Phosphatase: 71 (ref 25–125)
Bilirubin, Total: 0.9

## 2020-12-24 LAB — CBC AND DIFFERENTIAL
HCT: 47 — AB (ref 36–46)
Hemoglobin: 15.6 (ref 12.0–16.0)
Neutrophils Absolute: 3848
Platelets: 194 (ref 150–399)
WBC: 8

## 2020-12-24 LAB — CBC: RBC: 4.83 (ref 3.87–5.11)

## 2020-12-24 LAB — BASIC METABOLIC PANEL
BUN: 12 (ref 4–21)
CO2: 25 — AB (ref 13–22)
Chloride: 103 (ref 99–108)
Creatinine: 0.7 (ref 0.5–1.1)
Glucose: 70
Potassium: 4.2 (ref 3.4–5.3)
Sodium: 137 (ref 137–147)

## 2020-12-24 LAB — COMPREHENSIVE METABOLIC PANEL
Albumin: 3.6 (ref 3.5–5.0)
Calcium: 8.9 (ref 8.7–10.7)
Globulin: 2.6

## 2020-12-27 ENCOUNTER — Encounter: Payer: Self-pay | Admitting: Nurse Practitioner

## 2021-01-04 ENCOUNTER — Non-Acute Institutional Stay: Payer: Medicare PPO | Admitting: Internal Medicine

## 2021-01-04 DIAGNOSIS — F339 Major depressive disorder, recurrent, unspecified: Secondary | ICD-10-CM | POA: Diagnosis not present

## 2021-01-04 DIAGNOSIS — E039 Hypothyroidism, unspecified: Secondary | ICD-10-CM

## 2021-01-04 DIAGNOSIS — E871 Hypo-osmolality and hyponatremia: Secondary | ICD-10-CM

## 2021-01-04 DIAGNOSIS — R269 Unspecified abnormalities of gait and mobility: Secondary | ICD-10-CM | POA: Diagnosis not present

## 2021-01-04 DIAGNOSIS — F0150 Vascular dementia without behavioral disturbance: Secondary | ICD-10-CM | POA: Diagnosis not present

## 2021-01-05 ENCOUNTER — Encounter: Payer: Self-pay | Admitting: Internal Medicine

## 2021-01-05 NOTE — Progress Notes (Signed)
Location:    [facility] Room Number: [place] of Service:  ALF [phone]) Provider:  Veleta Miners MD  [name], MD  Patient Care Team: [name], MD as PCP - General (Internal Medicine) Mast, Man X, NP as Nurse Practitioner (Internal Medicine)  Extended Emergency Contact Information Primary Emergency Contact: Tremain,[name] Address: [address]          [place], [place] [address] [name] of [place] Phone: [phone] Mobile Phone: [phone] Relation: Son Secondary Emergency Contact: Blea,Brett Address: [address]          [place], [place] [address] [name] of [place] Phone: [phone] Work Phone: [phone] Mobile Phone: [phone] Relation: Son  Code Status:  DNR managed Care  Goals of care: Advanced Directive information Advanced Directives 01/05/2021  Does Patient Have a Medical Advance Directive? Yes  Type of Paramedic of [place];Living will;Out of facility DNR (pink MOST or yellow form)  Does patient want to make changes to medical advance directive? No - Patient declined  Copy of [place] in Chart? Yes - validated most recent copy scanned in chart (See row information)  Pre-existing out of facility DNR order (yellow form or pink MOST form) Pink MOST/Yellow Form most recent copy in chart - Physician notified to receive inpatient order     Chief Complaint  Patient presents with  . Medical Management of Chronic Issues    HPI:  Pt is a 87 y.o. female seen today for medical management of chronic diseases.    Patient has h/ohypothyroidism, dementia,, sinus bradycardia, prediabetes, hyponatremia, H/o Apical Pneumothorax in 10/20and depressionwith Psychosis  Doing well in AL. Does had fall few weeks ago. Seemed  Mechanical Denies Dizziness Mood stable Weight is stable No New Nursing issues   Past Medical History:  Diagnosis Date  . Eczema    . Hypothyroid   . Hypothyroidism   . Hypothyroidism   . Vitamin D deficiency    Past Surgical History:  Procedure Laterality Date  . CHOLECYSTECTOMY N/A 06/24/2013   Procedure: LAPAROSCOPIC CHOLECYSTECTOMY WITH INTRAOPERATIVE CHOLANGIOGRAM;  Surgeon: Ralene Ok, MD;  Location: [facility];  Service: General;  Laterality: N/A;  . TONSILLECTOMY      No Known Allergies  Allergies as of 01/04/2021   No Known Allergies     Medication List       Accurate as of January 04, 2021 11:59 PM. If you have any questions, ask your nurse or doctor.        aspirin EC 81 MG tablet Take 1 tablet (81 mg total) by mouth daily.   calcium-vitamin D 500-200 MG-UNIT tablet Commonly known as: OSCAL WITH D Take 1 tablet by mouth 2 (two) times daily. Once daily   donepezil 5 MG tablet Commonly known as: ARICEPT Take 1 tablet (5 mg total) by mouth at bedtime.   escitalopram 10 MG tablet Commonly known as: LEXAPRO Take 10 mg by mouth daily.   levothyroxine 100 MCG tablet Commonly known as: SYNTHROID Take 100 mcg by mouth daily before breakfast.   melatonin 1 MG Tabs tablet Take 1 mg by mouth at bedtime.       Review of Systems  Review of Systems  Constitutional: Negative for activity change, appetite change, chills, diaphoresis, fatigue and fever.  HENT: Negative for mouth sores, postnasal drip, rhinorrhea, sinus pain and sore throat.   Respiratory: Negative for apnea, cough, chest tightness, shortness of breath and wheezing.   Cardiovascular:  Negative for chest pain, palpitations and leg swelling.  Gastrointestinal: Negative for abdominal distention, abdominal pain, constipation, diarrhea, nausea and vomiting.  Genitourinary: Negative for dysuria and frequency.  Musculoskeletal: Negative for arthralgias, joint swelling and myalgias.  Skin: Negative for rash.  Neurological: Negative for dizziness, syncope, weakness, light-headedness and numbness.  Psychiatric/Behavioral: Negative for  behavioral problems, confusion and sleep disturbance.     Immunization History  Administered Date(s) Administered  . Influenza Whole 06/15/2018  . Influenza,inj,Quad PF,6+ Mos 06/25/2013  . Influenza-Unspecified 06/23/2020  . Moderna Sars-Covid-2 Vaccination 09/13/2019, 10/11/2019  . Pneumococcal Conjugate-13 03/11/2014  . Pneumococcal Polysaccharide-23 09/11/1986, 09/11/2001, 06/25/2013  . Tdap 03/16/2011  . Zoster Recombinat (Shingrix) 09/12/2007   Pertinent  Health Maintenance Due  Topic Date Due  . INFLUENZA VACCINE  04/11/2021  . DEXA SCAN  Completed  . PNA vac Low Risk Adult  Completed   Fall Risk  04/11/2019 10/02/2017  Falls in the past year? 0 Yes  Comment Emmi Telephone Survey: data to providers prior to load -  Number falls in past yr: - 1  Injury with Fall? - No   Functional Status Survey:    Vitals:   01/05/21 1523  BP: 130/60  Pulse: 62  Resp: (!) 22  Temp: 98.6 F (37 C)  SpO2: 94%  Weight: 163 lb 6.4 oz (74.1 kg)  Height: 4\' 8"  (1.422 m)   Body mass index is 36.63 kg/m. Physical Exam Constitutional: Oriented to person, place, and time. Well-developed and well-nourished.  HENT:  Head: Normocephalic.  Mouth/Throat: Oropharynx is clear and moist.  Eyes: Pupils are equal, round, and reactive to light.  Neck: Neck supple.  Cardiovascular: Normal rate and normal heart sounds.  No murmur heard. Pulmonary/Chest: Effort normal and breath sounds normal. No respiratory distress. No wheezes. She has no rales.  Abdominal: Soft. Bowel sounds are normal. No distension. There is no tenderness. There is no rebound.  Musculoskeletal: No edema.  Lymphadenopathy: none Neurological: Alert and oriented to person, place, and time.  Skin: Skin is warm and dry.  Psychiatric: Normal mood and affect. Behavior is normal. Thought content normal.   Labs reviewed: Recent Labs    07/02/20 0000 12/24/20 0000  NA 134* 137  K 4.0 4.2  CL 101 103  CO2 23* 25*  BUN 10 12   CREATININE 0.7 0.7  CALCIUM 9.0 8.9   Recent Labs    07/02/20 0000 12/24/20 0000  AST 15 19  ALT 14 14  ALKPHOS 65 71  ALBUMIN 3.3* 3.6   Recent Labs    07/02/20 0000 12/24/20 0000  WBC 7.5 8.0  NEUTROABS  --  3,848.00  HGB 15.2 15.6  HCT 46 47*  PLT 204 194   Lab Results  Component Value Date   TSH 2.11 07/02/2020   Lab Results  Component Value Date   HGBA1C 5.7 06/18/2018   Lab Results  Component Value Date   CHOL 188 01/30/2018   HDL 64 01/30/2018   LDLCALC 106 (H) 01/30/2018   TRIG 89 01/30/2018   CHOLHDL 2.9 01/30/2018    Significant Diagnostic Results in last 30 days:  No results found.  Assessment/Plan Hypothyroidism, unspecified type TSH normal in 10/21 Depression, recurrent (HCC) On Lexapro Hyponatremia Sodium stable Gait abnormality Continue Using Walker Vascular dementia without behavioral disturbance ([place]) On Aricept low dose And Aspirin    Family/ staff Communication:   Labs/tests ordered:

## 2021-01-18 DIAGNOSIS — M79671 Pain in right foot: Secondary | ICD-10-CM | POA: Diagnosis not present

## 2021-01-18 DIAGNOSIS — M79672 Pain in left foot: Secondary | ICD-10-CM | POA: Diagnosis not present

## 2021-01-18 DIAGNOSIS — B351 Tinea unguium: Secondary | ICD-10-CM | POA: Diagnosis not present

## 2021-03-09 ENCOUNTER — Non-Acute Institutional Stay: Payer: Medicare PPO | Admitting: Nurse Practitioner

## 2021-03-09 ENCOUNTER — Encounter: Payer: Self-pay | Admitting: Nurse Practitioner

## 2021-03-09 DIAGNOSIS — R269 Unspecified abnormalities of gait and mobility: Secondary | ICD-10-CM

## 2021-03-09 DIAGNOSIS — E039 Hypothyroidism, unspecified: Secondary | ICD-10-CM

## 2021-03-09 DIAGNOSIS — F0150 Vascular dementia without behavioral disturbance: Secondary | ICD-10-CM

## 2021-03-09 DIAGNOSIS — F339 Major depressive disorder, recurrent, unspecified: Secondary | ICD-10-CM

## 2021-03-09 NOTE — Assessment & Plan Note (Signed)
Uses walker, risk of falling due to dementia and gradually increased frailty.

## 2021-03-09 NOTE — Assessment & Plan Note (Signed)
takes Levothyroxine 13mcg qd, TSH 2.11 07/02/20

## 2021-03-09 NOTE — Assessment & Plan Note (Signed)
Resides in AL [facility] for safety, care assistance, on Donepezil 5mg  qd for memory.

## 2021-03-09 NOTE — Progress Notes (Signed)
Location:   [facility] Room Number: [phone] Place of Service:  ALF [phone]) Provider:  Yoona Ishii Otho Darner, NP    Patient Care Team: [name], MD as PCP - General (Internal Medicine) Lavonia Eager X, NP as Nurse Practitioner (Internal Medicine)  Extended Emergency Contact Information Primary Emergency Contact: Atwood,[name] Address: [address]          [place], [place] [address] [name] of [place] Phone: [phone] Mobile Phone: [phone] Relation: Son Secondary Emergency Contact: Pangle,Brett Address: [address]          [place], [place] [address] [name] of [place] Phone: [phone] Work Phone: [phone] Mobile Phone: [phone] Relation: Son  Code Status:  DNR Goals of care: Advanced Directive information Advanced Directives 03/09/2021  Does Patient Have a Medical Advance Directive? Yes  Type of Paramedic of [place];Living will;Out of facility DNR (pink MOST or yellow form)  Does patient want to make changes to medical advance directive? No - Patient declined  Copy of [place] in Chart? Yes - validated most recent copy scanned in chart (See row information)  Pre-existing out of facility DNR order (yellow form or pink MOST form) Yellow form placed in chart (order not valid for inpatient use);Pink MOST form placed in chart (order not valid for inpatient use)     Chief Complaint  Patient presents with   Medical Management of Chronic Issues    Routine follow up.   Health Maintenance    Discuss need for shingles vaccine.     HPI:  Pt is a 87 y.o. female seen today for medical management of chronic diseases.      Gait abnormality, uses walker             Dementia, resides in [facility] for safety, care assistance, on Donepezil 5mg  qd for memory.             Her mood is stable, on Escitalopram 10mg  qd. Na 137 12/24/20             Hypothyroidism, takes Levothyroxine 180mcg qd,  TSH 2.11 07/02/20  Past Medical History:  Diagnosis Date   Eczema    Hypothyroid    Hypothyroidism    Hypothyroidism    Vitamin D deficiency    Past Surgical History:  Procedure Laterality Date   CHOLECYSTECTOMY N/A 06/24/2013   Procedure: LAPAROSCOPIC CHOLECYSTECTOMY WITH INTRAOPERATIVE CHOLANGIOGRAM;  Surgeon: Ralene Ok, MD;  Location: [facility];  Service: General;  Laterality: N/A;   TONSILLECTOMY      No Known Allergies  Allergies as of 03/09/2021   No Known Allergies      Medication List        Accurate as of March 09, 2021  2:06 PM. If you have any questions, ask your nurse or doctor.          aspirin EC 81 MG tablet Take 1 tablet (81 mg total) by mouth daily.   calcium-vitamin D 500-200 MG-UNIT tablet Commonly known as: OSCAL WITH D Take 1 tablet by mouth 2 (two) times daily. Once daily   donepezil 5 MG tablet Commonly known as: ARICEPT Take 1 tablet (5 mg total) by mouth at bedtime.   escitalopram 10 MG tablet Commonly known as: LEXAPRO Take 10 mg by mouth daily.   levothyroxine 100 MCG tablet Commonly known as: SYNTHROID Take 100 mcg by mouth daily before breakfast.   melatonin 1 MG Tabs tablet Take 1 mg by  mouth at bedtime.        Review of Systems  Constitutional:  Negative for diaphoresis, fatigue and fever.  HENT:  Positive for hearing loss. Negative for congestion and voice change.   Eyes:  Negative for visual disturbance.  Respiratory:  Negative for shortness of breath.   Gastrointestinal:  Negative for abdominal pain and constipation.  Genitourinary:  Negative for dysuria and urgency.  Musculoskeletal:  Positive for gait problem.  Skin:  Negative for color change.  Neurological:  Negative for speech difficulty, weakness and light-headedness.       Memory lapses.   Psychiatric/Behavioral:  Negative for behavioral problems and sleep disturbance. The patient is not nervous/anxious.    Immunization History  Administered Date(s)  Administered   Influenza Whole 06/15/2018   Influenza,inj,Quad PF,6+ Mos 06/25/2013   Influenza-Unspecified 06/23/2020   Moderna Sars-Covid-2 Vaccination 09/13/2019, 10/11/2019, 07/20/2020, 02/08/2021   Pneumococcal Conjugate-13 03/11/2014   Pneumococcal Polysaccharide-23 09/11/1986, 09/11/2001, 06/25/2013   Tdap 03/16/2011   Zoster Recombinat (Shingrix) 09/12/2007   Pertinent  Health Maintenance Due  Topic Date Due   INFLUENZA VACCINE  04/11/2021   DEXA SCAN  Completed   PNA vac Low Risk Adult  Completed   Fall Risk  04/11/2019 10/02/2017  Falls in the past year? 0 Yes  Comment Emmi Telephone Survey: data to providers prior to load -  Number falls in past yr: - 1  Injury with Fall? - No   Functional Status Survey:    Vitals:   03/09/21 0854  BP: 128/73  Pulse: 70  Resp: 20  Temp: 98.1 F (36.7 C)  SpO2: 96%  Weight: 164 lb 9.6 oz (74.7 kg)  Height: 4\' 8"  (1.422 m)   Body mass index is 36.9 kg/m. Physical Exam Vitals and nursing note reviewed.  Constitutional:      Appearance: Normal appearance.  HENT:     Head: Normocephalic and atraumatic.     Mouth/Throat:     Mouth: Mucous membranes are moist.  Eyes:     Extraocular Movements: Extraocular movements intact.     Conjunctiva/sclera: Conjunctivae normal.     Pupils: Pupils are equal, round, and reactive to light.  Cardiovascular:     Rate and Rhythm: Normal rate and regular rhythm.     Heart sounds: No murmur heard. Pulmonary:     Breath sounds: No rales.  Abdominal:     General: Bowel sounds are normal.     Palpations: Abdomen is soft.     Tenderness: There is no abdominal tenderness.     Hernia: A hernia is present.     Comments: Umbilical hernia.   Musculoskeletal:     Cervical back: Normal range of motion and neck supple.     Right lower leg: No edema.     Left lower leg: No edema.  Skin:    General: Skin is warm and dry.  Neurological:     General: No focal deficit present.     Mental Status:  She is alert. Mental status is at baseline.     Gait: Gait abnormal.     Comments: Oriented to person, place.   Psychiatric:        Mood and Affect: Mood normal.        Behavior: Behavior normal.    Labs reviewed: Recent Labs    07/02/20 0000 12/24/20 0000  NA 134* 137  K 4.0 4.2  CL 101 103  CO2 23* 25*  BUN 10 12  CREATININE 0.7 0.7  CALCIUM  9.0 8.9   Recent Labs    07/02/20 0000 12/24/20 0000  AST 15 19  ALT 14 14  ALKPHOS 65 71  ALBUMIN 3.3* 3.6   Recent Labs    07/02/20 0000 12/24/20 0000  WBC 7.5 8.0  NEUTROABS  --  3,848.00  HGB 15.2 15.6  HCT 46 47*  PLT 204 194   Lab Results  Component Value Date   TSH 2.11 07/02/2020   Lab Results  Component Value Date   HGBA1C 5.7 06/18/2018   Lab Results  Component Value Date   CHOL 188 01/30/2018   HDL 64 01/30/2018   LDLCALC 106 (H) 01/30/2018   TRIG 89 01/30/2018   CHOLHDL 2.9 01/30/2018    Significant Diagnostic Results in last 30 days:  No results found.  Assessment/Plan Depression, recurrent (HCC) Her mood is stable, on Escitalopram 10mg  qd. Na 137 12/24/20  Hypothyroidism  takes Levothyroxine 128mcg qd, TSH 2.11 07/02/20  Dementia without behavioral disturbance (HCC) Resides in [facility] for safety, care assistance, on Donepezil 5mg  qd for memory.  Gait abnormality Uses walker, risk of falling due to dementia and gradually increased frailty.     Family/ staff Communication: plan of care reviewed with the patient and charge nurse.   Labs/tests ordered:  none  Time spend 40 minutes.

## 2021-03-09 NOTE — Assessment & Plan Note (Signed)
Her mood is stable, on Escitalopram 10mg  qd. Na 137 12/24/20

## 2021-04-22 DIAGNOSIS — F419 Anxiety disorder, unspecified: Secondary | ICD-10-CM | POA: Diagnosis not present

## 2021-04-22 DIAGNOSIS — G4700 Insomnia, unspecified: Secondary | ICD-10-CM | POA: Diagnosis not present

## 2021-04-22 DIAGNOSIS — R419 Unspecified symptoms and signs involving cognitive functions and awareness: Secondary | ICD-10-CM | POA: Diagnosis not present

## 2021-04-22 DIAGNOSIS — F330 Major depressive disorder, recurrent, mild: Secondary | ICD-10-CM | POA: Diagnosis not present

## 2021-05-03 DIAGNOSIS — M79671 Pain in right foot: Secondary | ICD-10-CM | POA: Diagnosis not present

## 2021-05-03 DIAGNOSIS — M79672 Pain in left foot: Secondary | ICD-10-CM | POA: Diagnosis not present

## 2021-05-03 DIAGNOSIS — B351 Tinea unguium: Secondary | ICD-10-CM | POA: Diagnosis not present

## 2021-05-26 DIAGNOSIS — F330 Major depressive disorder, recurrent, mild: Secondary | ICD-10-CM | POA: Diagnosis not present

## 2021-05-26 DIAGNOSIS — R419 Unspecified symptoms and signs involving cognitive functions and awareness: Secondary | ICD-10-CM | POA: Diagnosis not present

## 2021-05-26 DIAGNOSIS — G4700 Insomnia, unspecified: Secondary | ICD-10-CM | POA: Diagnosis not present

## 2021-05-26 DIAGNOSIS — F419 Anxiety disorder, unspecified: Secondary | ICD-10-CM | POA: Diagnosis not present

## 2021-06-23 DIAGNOSIS — G4700 Insomnia, unspecified: Secondary | ICD-10-CM | POA: Diagnosis not present

## 2021-06-23 DIAGNOSIS — F330 Major depressive disorder, recurrent, mild: Secondary | ICD-10-CM | POA: Diagnosis not present

## 2021-06-23 DIAGNOSIS — R419 Unspecified symptoms and signs involving cognitive functions and awareness: Secondary | ICD-10-CM | POA: Diagnosis not present

## 2021-06-23 DIAGNOSIS — F419 Anxiety disorder, unspecified: Secondary | ICD-10-CM | POA: Diagnosis not present

## 2021-07-13 ENCOUNTER — Non-Acute Institutional Stay: Payer: Medicare PPO | Admitting: Orthopedic Surgery

## 2021-07-13 ENCOUNTER — Encounter: Payer: Self-pay | Admitting: Orthopedic Surgery

## 2021-07-13 DIAGNOSIS — B372 Candidiasis of skin and nail: Secondary | ICD-10-CM

## 2021-07-13 DIAGNOSIS — R269 Unspecified abnormalities of gait and mobility: Secondary | ICD-10-CM | POA: Diagnosis not present

## 2021-07-13 DIAGNOSIS — F0150 Vascular dementia without behavioral disturbance: Secondary | ICD-10-CM | POA: Diagnosis not present

## 2021-07-13 DIAGNOSIS — E871 Hypo-osmolality and hyponatremia: Secondary | ICD-10-CM | POA: Diagnosis not present

## 2021-07-13 DIAGNOSIS — E039 Hypothyroidism, unspecified: Secondary | ICD-10-CM

## 2021-07-13 NOTE — Progress Notes (Addendum)
Location:   [facility] Room Number: [place]:  ALF [phone]) Provider:  Windell Moulding, NP  [name], MD  Patient Care Team: [name], MD as PCP - General (Internal Medicine) Mast, Man X, NP as Nurse Practitioner (Internal Medicine)  Extended Emergency Contact Information Primary Emergency Contact: Ceasar,[name] Address: [address]          [place], [place] [address] [name] of [place] Phone: [phone] Mobile Phone: [phone] Relation: Son Secondary Emergency Contact: Zellers,Brett Address: [address]          [place], [place] [address] [name] of [place] Phone: [phone] Work Phone: [phone] Mobile Phone: [phone] Relation: Son  Code Status:  DNR Goals of care: Advanced Directive information Advanced Directives 07/13/2021  Does Patient Have a Medical Advance Directive? Yes  Type of Paramedic of [place];Living will;Out of facility DNR (pink MOST or yellow form)  Does patient want to make changes to medical advance directive? No - Patient declined  Copy of [place] in Chart? Yes - validated most recent copy scanned in chart (See row information)  Pre-existing out of facility DNR order (yellow form or pink MOST form) Pink MOST form placed in chart (order not valid for inpatient use);Yellow form placed in chart (order not valid for inpatient use)     Chief Complaint  Patient presents with   Acute Visit    Right side rash   Health Maintenance    Zoster,tetanus/tdap, 3rd COVID vaccine, Flu vaccine    HPI:  Pt is a 87 y.o. female seen today for an acute visit for due to rash on right side. Symptoms noticed a few days ago by nursing staff. Initial rash covered right side, but rash has now extended to right axilla folds and breast. Skin red, flaking with scratch marks. She reports some itching but states it does not bother her much.  In the past  months she has been refusing shower days more often. She is wearing heavier clothing due to colder weather. Room very warm today during encounter.   Dementia- CT head 2016 revealed diffuse atrophy and moderate small vessel ischemic changes, no recent behavioral outbursts, remains on Aricept Hypothyroidism- TSH 2.11 06/2020, remains on levothyroxine Hyponatremia- Na+ 137 12/2020 Gait abnormality- ambulates with wheelchair, last fall 07/02/2021- no injury Depression- remains on Lexapro  Past Medical History:  Diagnosis Date   Eczema    Hypothyroid    Hypothyroidism    Hypothyroidism    Vitamin D deficiency    Past Surgical History:  Procedure Laterality Date   CHOLECYSTECTOMY N/A 06/24/2013   Procedure: LAPAROSCOPIC CHOLECYSTECTOMY WITH INTRAOPERATIVE CHOLANGIOGRAM;  Surgeon: Ralene Ok, MD;  Location: [facility];  Service: General;  Laterality: N/A;   TONSILLECTOMY      No Known Allergies  Allergies as of 07/13/2021   No Known Allergies      Medication List        Accurate as of July 13, 2021  4:40 PM. If you have any questions, ask your nurse or doctor.          aspirin EC 81 MG tablet Take 1 tablet (81 mg total) by mouth daily.   calcium-vitamin D 500-200 MG-UNIT tablet Commonly known as: OSCAL WITH D Take 1 tablet by mouth 2 (two) times daily. Once daily   donepezil 5 MG tablet Commonly known as: ARICEPT Take 1 tablet (5 mg total) by mouth at bedtime.  escitalopram 10 MG tablet Commonly known as: LEXAPRO Take 10 mg by mouth daily.   levothyroxine 100 MCG tablet Commonly known as: SYNTHROID Take 100 mcg by mouth daily before breakfast.   melatonin 1 MG Tabs tablet Take 1 mg by mouth at bedtime.        Review of Systems  Unable to perform ROS: Dementia   Immunization History  Administered Date(s) Administered   Influenza Whole 06/15/2018   Influenza,inj,Quad PF,6+ Mos 06/25/2013   Influenza-Unspecified 06/23/2020   Moderna Sars-Covid-2  Vaccination 09/13/2019, 10/11/2019, 07/20/2020, 02/08/2021   Pneumococcal Conjugate-13 03/11/2014   Pneumococcal Polysaccharide-23 09/11/1986, 09/11/2001, 06/25/2013   Tdap 03/16/2011   Zoster Recombinat (Shingrix) 09/12/2007   Pertinent  Health Maintenance Due  Topic Date Due   INFLUENZA VACCINE  04/11/2021   DEXA SCAN  Completed   Fall Risk 06/16/2019 06/16/2019 06/17/2019 06/17/2019 06/18/2019  Falls in the past year? - - - - -  Was there an injury with Fall? - - - - -  Patient Fall Risk Level High fall risk High fall risk High fall risk High fall risk High fall risk   Functional Status Survey:    Vitals:   07/13/21 1636  BP: (!) 123/56  Pulse: 78  Resp: 18  Temp: 97.6 F (36.4 C)  SpO2: 92%  Weight: 169 lb 12.8 oz (77 kg)  Height: 4\' 8"  (1.422 m)   Body mass index is 38.07 kg/m. Physical Exam Vitals reviewed.  Constitutional:      General: She is not in acute distress. Eyes:     General:        Right eye: No discharge.        Left eye: No discharge.  Neck:     Vascular: No carotid bruit.  Cardiovascular:     Rate and Rhythm: Normal rate and regular rhythm.     Pulses: Normal pulses.     Heart sounds: Normal heart sounds. No murmur heard. Pulmonary:     Effort: Pulmonary effort is normal. No respiratory distress.     Breath sounds: Normal breath sounds. No wheezing.  Abdominal:     General: Bowel sounds are normal. There is no distension.     Palpations: Abdomen is soft.     Tenderness: There is no abdominal tenderness.  Musculoskeletal:     Cervical back: Normal range of motion.     Right lower leg: No edema.     Left lower leg: No edema.  Lymphadenopathy:     Cervical: No cervical adenopathy.  Skin:    Findings: Rash present.     Comments: Excoriated skin under right breast, axilla folds and right upper side, skin appears red/moist and scaly, some scratch marks present.   Neurological:     General: No focal deficit present.     Mental Status: She is  alert. Mental status is at baseline.     Motor: Weakness present.     Gait: Gait abnormal.     Comments: Wheelchair  Psychiatric:        Mood and Affect: Mood normal.        Behavior: Behavior normal.     Comments: Very pleasant, follows commands    Labs reviewed: Recent Labs    12/24/20 0000  NA 137  K 4.2  CL 103  CO2 25*  BUN 12  CREATININE 0.7  CALCIUM 8.9   Recent Labs    12/24/20 0000  AST 19  ALT 14  ALKPHOS 71  ALBUMIN 3.6  Recent Labs    12/24/20 0000  WBC 8.0  NEUTROABS 3,848.00  HGB 15.6  HCT 47*  PLT 194   Lab Results  Component Value Date   TSH 2.11 07/02/2020   Lab Results  Component Value Date   HGBA1C 5.7 06/18/2018   Lab Results  Component Value Date   CHOL 188 01/30/2018   HDL 64 01/30/2018   LDLCALC 106 (H) 01/30/2018   TRIG 89 01/30/2018   CHOLHDL 2.9 01/30/2018    Significant Diagnostic Results in last 30 days:  No results found.  Assessment/Plan 1. Candidal dermatitis - suspect due to dry heat and moisture - she has also been refusing shower days - start nystatin 100,000 units/g- apply to right side, beast and axilla TID x 14 days then TID prn - recommend showering weekly  2. Vascular dementia without behavioral disturbance (HCC) - no recent behavioral outbursts - doing well in AL - cont Aricept  3. Acquired hypothyroidism - TSH normal - cont levothyroxine  4. Hyponatremia - Na stable  5. Gait abnormality - last fall 10/22- no injury - ambulates with wheelchair - cont falls safety precautions    Family/ staff Communication: plan discussed with patient and nurse  Labs/tests ordered:  none

## 2021-07-15 ENCOUNTER — Non-Acute Institutional Stay: Payer: Medicare PPO | Admitting: Nurse Practitioner

## 2021-07-15 ENCOUNTER — Encounter: Payer: Self-pay | Admitting: Nurse Practitioner

## 2021-07-15 DIAGNOSIS — L309 Dermatitis, unspecified: Secondary | ICD-10-CM | POA: Insufficient documentation

## 2021-07-15 DIAGNOSIS — R269 Unspecified abnormalities of gait and mobility: Secondary | ICD-10-CM

## 2021-07-15 DIAGNOSIS — F339 Major depressive disorder, recurrent, unspecified: Secondary | ICD-10-CM

## 2021-07-15 DIAGNOSIS — F0390 Unspecified dementia without behavioral disturbance: Secondary | ICD-10-CM | POA: Diagnosis not present

## 2021-07-15 DIAGNOSIS — R21 Rash and other nonspecific skin eruption: Secondary | ICD-10-CM | POA: Diagnosis not present

## 2021-07-15 DIAGNOSIS — L219 Seborrheic dermatitis, unspecified: Secondary | ICD-10-CM | POA: Insufficient documentation

## 2021-07-15 DIAGNOSIS — E039 Hypothyroidism, unspecified: Secondary | ICD-10-CM | POA: Diagnosis not present

## 2021-07-15 NOTE — Assessment & Plan Note (Signed)
Her mood is stable, continue Escitalopram.

## 2021-07-15 NOTE — Assessment & Plan Note (Signed)
resides in AL [facility] for safety, care assistance, on Donepezil for memory.

## 2021-07-15 NOTE — Assessment & Plan Note (Signed)
Fall risk, close supervision, continue walking with walker

## 2021-07-15 NOTE — Assessment & Plan Note (Signed)
takes Levothyroxine TSH 2.11 07/02/20

## 2021-07-15 NOTE — Progress Notes (Signed)
Location:   [facility] Room Number: 737 Place of Service:  ALF (13) Provider: Lennie Odor Noell Lorensen NP  [name], MD  Patient Care Team: [name], MD as PCP - General (Internal Medicine) Brighten Orndoff X, NP as Nurse Practitioner (Internal Medicine)  Extended Emergency Contact Information Primary Emergency Contact: Lisa Barrera Address: [address]          [place], [place] [address] Lisa Barrera Phone: [phone] Mobile Phone: [phone] Relation: Son Secondary Emergency Contact: Lisa Barrera Address: [address]          [place], [place] [address] Lisa Barrera Phone: [phone] Work Phone: [phone] Mobile Phone: [phone] Relation: Son  Code Status: DNR Goals of care: Advanced Directive information Advanced Directives 07/13/2021  Does Patient Have a Medical Advance Directive? Yes  Type of Paramedic of [place];Living will;Out of facility DNR (pink MOST or yellow form)  Does patient want to make changes to medical advance directive? No - Patient declined  Copy of [place] in Chart? Yes - validated most recent copy scanned in chart (See row information)  Pre-existing out of facility DNR order (yellow form or pink MOST form) Pink MOST form placed in chart (order not valid for inpatient use);Yellow form placed in chart (order not valid for inpatient use)     Chief Complaint  Patient presents with   Acute Visit    Persisted rash    HPI:  Pt is a 87 y.o. female seen today for an acute visit for persisted itching rash mid back, under breast R>L, groins R>L, started Nystatin-too soon to eval.    Gait abnormality, uses walker             Dementia, resides in [facility] for safety, care assistance, on Donepezil for memory.             Her mood is stable, on Escitalopram, Na 137 12/24/20             Hypothyroidism, takes Levothyroxine TSH 2.11 07/02/20 Past Medical History:   Diagnosis Date   Eczema    Hypothyroid    Hypothyroidism    Hypothyroidism    Vitamin D deficiency    Past Surgical History:  Procedure Laterality Date   CHOLECYSTECTOMY N/A 06/24/2013   Procedure: LAPAROSCOPIC CHOLECYSTECTOMY WITH INTRAOPERATIVE CHOLANGIOGRAM;  Surgeon: Ralene Ok, MD;  Location: [facility];  Service: General;  Laterality: N/A;   TONSILLECTOMY      No Known Allergies  Allergies as of 07/15/2021   No Known Allergies      Medication List        Accurate as of July 15, 2021 12:40 PM. If you have any questions, ask your nurse or doctor.          aspirin EC 81 MG tablet Take 1 tablet (81 mg total) by mouth daily.   calcium-vitamin D 500-200 MG-UNIT tablet Commonly known as: OSCAL WITH D Take 1 tablet by mouth 2 (two) times daily. Once daily   donepezil 5 MG tablet Commonly known as: ARICEPT Take 1 tablet (5 mg total) by mouth at bedtime.   escitalopram 10 MG tablet Commonly known as: LEXAPRO Take 10 mg by mouth daily.   levothyroxine 100 MCG tablet Commonly known as: SYNTHROID Take 100 mcg by mouth daily before breakfast.   melatonin 1 MG Tabs tablet Take 1 mg by mouth at bedtime.        Review of Systems  Constitutional:  Negative for activity change, appetite change and fever.  HENT:  Positive for hearing loss. Negative for congestion and voice change.   Eyes:  Negative for visual disturbance.  Respiratory:  Negative for shortness of breath.   Gastrointestinal:  Negative for abdominal pain and constipation.  Genitourinary:  Negative for dysuria and urgency.  Musculoskeletal:  Positive for gait problem.  Skin:  Positive for rash. Negative for color change.  Neurological:  Negative for speech difficulty, weakness and light-headedness.       Memory lapses.   Psychiatric/Behavioral:  Negative for behavioral problems and sleep disturbance. The patient is not nervous/anxious.    Immunization History  Administered Date(s) Administered    Influenza Whole 06/15/2018   Influenza,inj,Quad PF,6+ Mos 06/25/2013   Influenza-Unspecified 06/23/2020   Moderna Sars-Covid-2 Vaccination 09/13/2019, 10/11/2019, 07/20/2020, 02/08/2021   Pneumococcal Conjugate-13 03/11/2014   Pneumococcal Polysaccharide-23 09/11/1986, 09/11/2001, 06/25/2013   Tdap 03/16/2011   Zoster Recombinat (Shingrix) 09/12/2007   Pertinent  Health Maintenance Due  Topic Date Due   INFLUENZA VACCINE  04/11/2021   DEXA SCAN  Completed   Fall Risk 06/16/2019 06/16/2019 06/17/2019 06/17/2019 06/18/2019  Falls in the past year? - - - - -  Was there an injury with Fall? - - - - -  Patient Fall Risk Level High fall risk High fall risk High fall risk High fall risk High fall risk   Functional Status Survey:    Vitals:   07/15/21 1227  BP: (!) 123/56  Pulse: 63  Resp: 18  Temp: 98 F (36.7 C)  SpO2: 90%   There is no height or weight on file to calculate BMI. Physical Exam Vitals and nursing note reviewed.  Constitutional:      Appearance: Normal appearance.  HENT:     Head: Normocephalic and atraumatic.     Mouth/Throat:     Mouth: Mucous membranes are moist.  Eyes:     Extraocular Movements: Extraocular movements intact.     Conjunctiva/sclera: Conjunctivae normal.     Pupils: Pupils are equal, round, and reactive to light.  Cardiovascular:     Rate and Rhythm: Normal rate and regular rhythm.     Heart sounds: No murmur heard. Pulmonary:     Breath sounds: No rales.  Abdominal:     General: Bowel sounds are normal.     Palpations: Abdomen is soft.     Tenderness: There is no abdominal tenderness.     Hernia: A hernia is present.     Comments: Umbilical hernia.   Musculoskeletal:     Cervical back: Normal range of motion and neck supple.     Right lower leg: No edema.     Left lower leg: No edema.  Skin:    General: Skin is warm and dry.     Findings: Rash present.     Comments: Redness mid back, under breast mostly R, R groin, itching   Neurological:     General: No focal deficit present.     Mental Status: She is alert. Mental status is at baseline.     Gait: Gait abnormal.     Comments: Oriented to person, place.   Psychiatric:        Mood and Affect: Mood normal.        Behavior: Behavior normal.    Labs reviewed: Recent Labs    12/24/20 0000  NA 137  K 4.2  CL 103  CO2 25*  BUN 12  CREATININE 0.7  CALCIUM 8.9  Recent Labs    12/24/20 0000  AST 19  ALT 14  ALKPHOS 71  ALBUMIN 3.6   Recent Labs    12/24/20 0000  WBC 8.0  NEUTROABS 3,848.00  HGB 15.6  HCT 47*  PLT 194   Lab Results  Component Value Date   TSH 2.11 07/02/2020   Lab Results  Component Value Date   HGBA1C 5.7 06/18/2018   Lab Results  Component Value Date   CHOL 188 01/30/2018   HDL 64 01/30/2018   LDLCALC 106 (H) 01/30/2018   TRIG 89 01/30/2018   CHOLHDL 2.9 01/30/2018    Significant Diagnostic Results in last 30 days:  No results found.  Assessment/Plan: Rash Moist, heat are contributory, mid back, under R breast. Right groin, itching, started Nystatin cream 2 days ago-too soon to eval, will add 1% Hydrocortisone cream bid until healed, Diflucan 100mg  qd x3 days.   Gait abnormality Fall risk, close supervision, continue walking with walker  Depression, recurrent ([place]) Her mood is stable, continue Escitalopram.   Dementia without behavioral disturbance ([place]) resides in AL [facility] for safety, care assistance, on Donepezil for memory.  Hypothyroidism takes Levothyroxine TSH 2.11 07/02/20    Family/ staff Communication: plan of care reviewed with the patient and charge nurse.   Labs/tests ordered:  none  Time spend 40 minutes.

## 2021-07-15 NOTE — Assessment & Plan Note (Signed)
Moist, heat are contributory, mid back, under R breast. Right groin, itching, started Nystatin cream 2 days ago-too soon to eval, will add 1% Hydrocortisone cream bid until healed, Diflucan 100mg  qd x3 days.

## 2021-07-21 DIAGNOSIS — F330 Major depressive disorder, recurrent, mild: Secondary | ICD-10-CM | POA: Diagnosis not present

## 2021-07-21 DIAGNOSIS — G4700 Insomnia, unspecified: Secondary | ICD-10-CM | POA: Diagnosis not present

## 2021-07-21 DIAGNOSIS — R419 Unspecified symptoms and signs involving cognitive functions and awareness: Secondary | ICD-10-CM | POA: Diagnosis not present

## 2021-07-21 DIAGNOSIS — F411 Generalized anxiety disorder: Secondary | ICD-10-CM | POA: Diagnosis not present

## 2021-08-16 ENCOUNTER — Non-Acute Institutional Stay (INDEPENDENT_AMBULATORY_CARE_PROVIDER_SITE_OTHER): Payer: Medicare PPO | Admitting: Nurse Practitioner

## 2021-08-16 ENCOUNTER — Encounter: Payer: Self-pay | Admitting: Nurse Practitioner

## 2021-08-16 DIAGNOSIS — Z0000 Encounter for general adult medical examination without abnormal findings: Secondary | ICD-10-CM

## 2021-08-16 NOTE — Progress Notes (Addendum)
Subjective:   Lisa Barrera is a 87 y.o. female who presents for Medicare Annual (Subsequent) preventive examination at [facility]. Cardiac Risk Factors include: advanced age (>57men, >74 women);dyslipidemia;obesity (BMI >30kg/m2)     Objective:    Today's Vitals   08/16/21 1632  BP: 136/70  Pulse: 78  Resp: 18  Temp: 98.1 F (36.7 C)  SpO2: 94%  Weight: 169 lb 12.8 oz (77 kg)  Height: $Remove'4\' 8"'UoXhnds$  (1.422 m)   Body mass index is 38.07 kg/m.  Advanced Directives 08/23/2021 08/16/2021 07/13/2021 03/09/2021 01/05/2021 03/18/2020 01/06/2020  Does Patient Have a Medical Advance Directive? Yes Yes Yes Yes Yes Yes Yes  Type of Paramedic of [place];Living will;Out of facility DNR (pink MOST or yellow form) [place];Living will;Out of facility DNR (pink MOST or yellow form) [place];Living will;Out of facility DNR (pink MOST or yellow form) [place];Living will;Out of facility DNR (pink MOST or yellow form) [place];Living will;Out of facility DNR (pink MOST or yellow form) Healthcare Power of [place];Out of facility DNR (pink MOST or yellow form)  Does patient want to make changes to medical advance directive? No - Patient declined No - Patient declined No - Patient declined No - Patient declined No - Patient declined No - Patient declined No - Patient declined  Copy of [place] in Chart? Yes - validated most recent copy scanned in chart (See row information) Yes - validated most recent copy scanned in chart (See row information) Yes - validated most recent copy scanned in chart (See row information) Yes - validated most recent copy scanned in chart (See row information) Yes - validated most recent copy scanned in chart (See row information) Yes - validated most recent copy scanned in chart (See row information) Yes -  validated most recent copy scanned in chart (See row information)  Pre-existing out of facility DNR order (yellow form or pink MOST form) - - Pink MOST form placed in chart (order not valid for inpatient use);Yellow form placed in chart (order not valid for inpatient use) Yellow form placed in chart (order not valid for inpatient use);Pink MOST form placed in chart (order not valid for inpatient use) Pink MOST/Yellow Form most recent copy in chart - Physician notified to receive inpatient order - Yellow form placed in chart (order not valid for inpatient use);Pink MOST form placed in chart (order not valid for inpatient use)    Current Medications (verified) Outpatient Encounter Medications as of 08/16/2021  Medication Sig   aspirin EC 81 MG tablet Take 1 tablet (81 mg total) by mouth daily.   calcium-vitamin D (OSCAL WITH D) 500-200 MG-UNIT tablet Take 1 tablet by mouth 2 (two) times daily. Once daily   donepezil (ARICEPT) 5 MG tablet Take 1 tablet (5 mg total) by mouth at bedtime.   escitalopram (LEXAPRO) 10 MG tablet Take 15 mg by mouth daily. (1 & 1/2 tablet)   levothyroxine (SYNTHROID, LEVOTHROID) 100 MCG tablet Take 100 mcg by mouth daily before breakfast.   melatonin 1 MG TABS tablet Take 1 mg by mouth at bedtime.   No facility-administered encounter medications on file as of 08/16/2021.    Allergies (verified) Patient has no known allergies.   History: Past Medical History:  Diagnosis Date   Eczema    Hypothyroid    Hypothyroidism    Hypothyroidism    Vitamin D deficiency  Past Surgical History:  Procedure Laterality Date   CHOLECYSTECTOMY N/A 06/24/2013   Procedure: LAPAROSCOPIC CHOLECYSTECTOMY WITH INTRAOPERATIVE CHOLANGIOGRAM;  Surgeon: Ralene Ok, MD;  Location: [facility];  Service: General;  Laterality: N/A;   TONSILLECTOMY     Family History  Problem Relation Age of Onset   Multiple myeloma Mother    Prostate cancer Father    Heart disease Father    Social  History   Socioeconomic History   Marital status: Widowed    Spouse name: Not on file   Number of children: 3   Years of education: College   Highest education level: Not on file  Occupational History   Occupation: Retired  Tobacco Use   Smoking status: Never   Smokeless tobacco: Never  Vaping Use   Vaping Use: Never used  Substance and Sexual Activity   Alcohol use: No    Alcohol/week: 0.0 standard drinks   Drug use: No   Sexual activity: Not on file  Other Topics Concern   Not on file  Social History Narrative   Tobacco use, amount per day now: None      Past tobacco use, amount per day: None      How many years did you use tobacco: Never      Alcohol use (drinks per week): None      Diet: 2 meals a day at [facility]      Do you drink/eat things with caffeine? Coffee      Marital status: Widowed             What year were you married? 1952      Do you live in a house, apartment, assisted living, condo, trailer? Apartment      Is it one or more stories? 1      How many persons live in your home?      Do you have any pets in your home? 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 Yes           How often? Weekly and daily classes. Does walking on her own.      Do you have a living will? Yes      Do you have a DNR form?   Yes         If not, do you want to discuss one?      Do you have signed POA/HPOA forms? Yes              Social Determinants of Health   Financial Resource Strain: Not on file  Food Insecurity: Not on file  Transportation Needs: Not on file  Physical Activity: Not on file  Stress: Not on file  Social Connections: Not on file    Tobacco Counseling Counseling given: Not Answered    Clinical Intake:  Pre-visit preparation completed: Yes  Pain : No/denies pain     BMI - recorded: 38 Nutritional Risks: None Diabetes: No  How often do you need to have someone help you when you  read instructions, pamphlets, or other written materials from your doctor or pharmacy?: 1 - Never What is the last grade level you completed in school?: 4 years college  Diabetic?no  Interpreter Needed?: No      Activities of Daily Living In your present state of health, do you have any difficulty performing the following activities: 08/16/2021  Hearing? Y  Comment uses  hearing aids  Vision? N  Difficulty concentrating or making decisions? Y  Walking or climbing stairs? Y  Dressing or bathing? Y  Doing errands, shopping? Y  Preparing Food and eating ? N  Using the Toilet? N  In the past six months, have you accidently leaked urine? Y  Do you have problems with loss of bowel control? N  Managing your Medications? Y  Managing your Finances? Y  Housekeeping or managing your Housekeeping? Y  Some recent data might be hidden    Patient Care Team: [name], MD as PCP - General (Internal Medicine) Brayn Eckstein X, NP as Nurse Practitioner (Internal Medicine)  Indicate any recent Medical Services you may have received from other than [facility] providers in the past year (date may be approximate).     Assessment:   This is a routine wellness examination for [place].  Hearing/Vision screen No results found.  Dietary issues and exercise activities discussed: Current Exercise Habits: The patient does not participate in regular exercise at present, Exercise limited by: psychological condition(s)   Goals Addressed             This Visit's Progress    Maintain Mobility and Function       Evidence-based guidance:  Emphasize the importance of physical activity and aerobic exercise as included in treatment plan; assess barriers to adherence; consider patient's abilities and preferences.  Encourage gradual increase in activity or exercise instead of stopping if pain occurs.  Reinforce individual therapy exercise prescription, such as strengthening, stabilization and stretching  programs.  Promote optimal body mechanics to stabilize the spine with lifting and functional activity.  Encourage activity and mobility modifications to facilitate optimal function, such as using a log roll for bed mobility or dressing from a seated position.  Reinforce individual adaptive equipment recommendations to limit excessive spinal movements, such as a Systems analyst.  Assess adequacy of sleep; encourage use of sleep hygiene techniques, such as bedtime routine; use of white noise; dark, cool bedroom; avoiding daytime naps, heavy meals or exercise before bedtime.  Promote positions and modification to optimize sleep and sexual activity; consider pillows or positioning devices to assist in maintaining neutral spine.  Explore options for applying ergonomic principles at work and home, such as frequent position changes, using ergonomically designed equipment and working at optimal height.  Promote modifications to increase comfort with driving such as lumbar support, optimizing seat and steering wheel position, using cruise control and taking frequent rest stops to stretch and walk.   Notes:       Depression Screen PHQ 2/9 Scores 10/02/2017  PHQ - 2 Score 0    Fall Risk Fall Risk  04/11/2019 10/02/2017  Falls in the past year? 0 Yes  Comment Emmi Telephone Survey: data to providers prior to load -  Number falls in past yr: - 1  Injury with Fall? - No    FALL RISK PREVENTION PERTAINING TO THE HOME:  Any stairs in or around the home? Yes  If so, are there any without handrails? No  Home free of loose throw rugs in walkways, pet beds, electrical cords, etc? Yes  Adequate lighting in your home to reduce risk of falls? Yes   ASSISTIVE DEVICES UTILIZED TO PREVENT FALLS:  Life alert? No  Use of a cane, walker or w/c? Yes  Grab bars in the bathroom? Yes  Shower chair or bench in shower? Yes  Elevated toilet seat or a handicapped toilet? No   TIMED UP AND  GO:  Was the test  performed? Yes .  Length of time to ambulate 10 feet: 20 sec.   Gait slow and steady with assistive device  Cognitive Function: MMSE - Mini Mental State Exam 08/27/2021 03/19/2018 06/29/2017 06/26/2017  Not completed: - (No Data) - (No Data)  Orientation to time 1 5 4 4   Orientation to Place 4 4 5 5   Registration 3 3 3 3   Attention/ Calculation 5 5 4 4   Recall 0 0 2 2  Language- name 2 objects 2 0 2 2  Language- repeat 1 1 1 1   Language- follow 3 step command 3 3 1 1   Language- read & follow direction 1 1 1 1   Write a sentence 1 1 1 1   Copy design 1 1 0 0  Total score 22 24 24 24         Immunizations Immunization History  Administered Date(s) Administered   Influenza Whole 06/15/2018   Influenza,inj,Quad PF,6+ Mos 06/25/2013   Influenza-Unspecified 06/23/2020, 06/30/2021   Moderna Sars-Covid-2 Vaccination 09/13/2019, 10/11/2019, 07/20/2020, 02/08/2021   Pneumococcal Conjugate-13 03/11/2014   Pneumococcal Polysaccharide-23 09/11/1986, 09/11/2001, 06/25/2013   Tdap 03/16/2011   Unspecified SARS-COV-2 Vaccination 05/31/2021   Zoster Recombinat (Shingrix) 09/12/2007    TDAP status: Up to date  Flu Vaccine status: Up to date  Pneumococcal vaccine status: Up to date  Covid-19 vaccine status: Information provided on how to obtain vaccines.   Qualifies for Shingles Vaccine? Yes   Zostavax completed Yes   Shingrix Completed?: Yes  Screening Tests Health Maintenance  Topic Date Due   Zoster Vaccines- Shingrix (2 of 2) 11/07/2007   TETANUS/TDAP  03/15/2021   Pneumonia Vaccine 75+ Years old  Completed   INFLUENZA VACCINE  Completed   DEXA SCAN  Completed   COVID-19 Vaccine  Completed   HPV VACCINES  Aged Out    Health Maintenance  Health Maintenance Due  Topic Date Due   Zoster Vaccines- Shingrix (2 of 2) 11/07/2007   TETANUS/TDAP  03/15/2021    Colorectal cancer screening: No longer required.   Mammogram status: No longer required due to age.  Bone Density  status: Ordered DEXA. Pt provided with contact info and advised to call to schedule appt.  Lung Cancer Screening: (Low Dose CT Chest recommended if Age 77-80 years, 30 pack-year currently smoking OR have quit w/in 15years.) does not qualify.    Additional Screening:  Hepatitis C Screening: does not qualify  Vision Screening: Recommended annual ophthalmology exams for early detection of glaucoma and other disorders of the eye. Is the patient up to date with their annual eye exam?  No  Who is the provider or what is the name of the office in which the patient attends annual eye exams? Will refer to Ophthalmology if HPOA consents.  If pt is not established with a provider, would they like to be referred to a provider to establish care? No .   Dental Screening: Recommended annual dental exams for proper oral hygiene  Community Resource Referral / Chronic Care Management: CRR required this visit?  No   CCM required this visit?  No      Plan:     I have personally reviewed and noted the following in the patient's chart:   Medical and social history Use of alcohol, tobacco or illicit drugs  Current medications and supplements including opioid prescriptions.  Functional ability and status Nutritional status Physical activity Advanced directives List of other physicians Hospitalizations, surgeries, and ER visits in previous  12 months Vitals Screenings to include cognitive, depression, and falls Referrals and appointments  In addition, I have reviewed and discussed with patient certain preventive protocols, quality metrics, and best practice recommendations. A written personalized care plan for preventive services as well as general preventive health recommendations were provided to patient.   MMSE, Ophthalmology referral, DEXA order provided.   Jebidiah Baggerly X Anoop Hemmer, NP   08/27/2021

## 2021-08-19 ENCOUNTER — Encounter: Payer: Self-pay | Admitting: Nurse Practitioner

## 2021-08-19 DIAGNOSIS — R419 Unspecified symptoms and signs involving cognitive functions and awareness: Secondary | ICD-10-CM | POA: Diagnosis not present

## 2021-08-19 DIAGNOSIS — G4700 Insomnia, unspecified: Secondary | ICD-10-CM | POA: Diagnosis not present

## 2021-08-19 DIAGNOSIS — F330 Major depressive disorder, recurrent, mild: Secondary | ICD-10-CM | POA: Diagnosis not present

## 2021-08-19 DIAGNOSIS — F411 Generalized anxiety disorder: Secondary | ICD-10-CM | POA: Diagnosis not present

## 2021-08-23 ENCOUNTER — Encounter: Payer: Self-pay | Admitting: Internal Medicine

## 2021-08-23 ENCOUNTER — Non-Acute Institutional Stay: Payer: Medicare PPO | Admitting: Internal Medicine

## 2021-08-23 DIAGNOSIS — E871 Hypo-osmolality and hyponatremia: Secondary | ICD-10-CM | POA: Diagnosis not present

## 2021-08-23 DIAGNOSIS — R269 Unspecified abnormalities of gait and mobility: Secondary | ICD-10-CM | POA: Diagnosis not present

## 2021-08-23 DIAGNOSIS — F0150 Vascular dementia without behavioral disturbance: Secondary | ICD-10-CM | POA: Diagnosis not present

## 2021-08-23 DIAGNOSIS — E039 Hypothyroidism, unspecified: Secondary | ICD-10-CM | POA: Diagnosis not present

## 2021-08-23 DIAGNOSIS — F339 Major depressive disorder, recurrent, unspecified: Secondary | ICD-10-CM | POA: Diagnosis not present

## 2021-08-23 NOTE — Progress Notes (Signed)
Location:   [facility] Room Number: [place] of Service:  ALF [phone]) Provider:  Veleta Miners MD  [name], MD  Patient Care Team: [name], MD as PCP - General (Internal Medicine) Mast, Man X, NP as Nurse Practitioner (Internal Medicine)  Extended Emergency Contact Information Primary Emergency Contact: Raygoza,[name] Address: [address]          [place], [place] [address] [name] of [place] Phone: [phone] Mobile Phone: [phone] Relation: Son Secondary Emergency Contact: Erlich,Brett Address: [address]          [place],  [address] [name] of [place] Phone: [phone] Work Phone: [phone] Mobile Phone: [phone] Relation: Son  Code Status:  DNR Managed Care Goals of care: Advanced Directive information Advanced Directives 08/23/2021  Does Patient Have a Medical Advance Directive? Yes  Type of Paramedic of [place];Living will;Out of facility DNR (pink MOST or yellow form)  Does patient want to make changes to medical advance directive? No - Patient declined  Copy of [place] in Chart? Yes - validated most recent copy scanned in chart (See row information)  Pre-existing out of facility DNR order (yellow form or pink MOST form) -     Chief Complaint  Patient presents with   Medical Management of Chronic Issues   Quality Metric Gaps    Zoster Vaccines- Shingrix (2 of 2) TETANUS/TDAP (Every 10 Years)      HPI:  Pt is a 87 y.o. female seen today for medical management of chronic diseases.    Patient has h/o hypothyroidism, dementia,, sinus bradycardia, prediabetes, hyponatremia, H/o Apical Pneumothorax in 10/20  and  depression with Psychosis     She is stable. No new Nursing issues. No Behavior issues Her weight is stable Walks with her walker No Recent Falls Wt Readings from Last 3 Encounters:  08/23/21 169 lb 12.8 oz (77  kg)  08/16/21 169 lb 12.8 oz (77 kg)  07/13/21 169 lb 12.8 oz (77 kg)    Past Medical History:  Diagnosis Date   Eczema    Hypothyroid    Hypothyroidism    Hypothyroidism    Vitamin D deficiency    Past Surgical History:  Procedure Laterality Date   CHOLECYSTECTOMY N/A 06/24/2013   Procedure: LAPAROSCOPIC CHOLECYSTECTOMY WITH INTRAOPERATIVE CHOLANGIOGRAM;  Surgeon: Ralene Ok, MD;  Location: [facility];  Service: General;  Laterality: N/A;   TONSILLECTOMY      No Known Allergies  Allergies as of 08/23/2021   No Known Allergies      Medication List        Accurate as of August 23, 2021  3:09 PM. If you have any questions, ask your nurse or doctor.          aspirin EC 81 MG tablet Take 1 tablet (81 mg total) by mouth daily.   calcium-vitamin D 500-200 MG-UNIT tablet Commonly known as: OSCAL WITH D Take 1 tablet by mouth 2 (two) times daily. Once daily   donepezil 5 MG tablet Commonly known as: ARICEPT Take 1 tablet (5 mg total) by mouth at bedtime.   escitalopram 10 MG tablet Commonly known as: LEXAPRO Take 15 mg by mouth daily. (1 & 1/2 tablet)   levothyroxine 100 MCG tablet Commonly known as: SYNTHROID Take 100 mcg by mouth daily before breakfast.   melatonin 1 MG Tabs tablet Take 1 mg by mouth at bedtime.  Review of Systems  Constitutional:  Negative for activity change and appetite change.  HENT: Negative.    Respiratory:  Negative for cough and shortness of breath.   Cardiovascular:  Negative for leg swelling.  Gastrointestinal:  Negative for constipation.  Genitourinary: Negative.   Musculoskeletal:  Negative for arthralgias, gait problem and myalgias.  Skin: Negative.   Neurological:  Negative for dizziness and weakness.  Psychiatric/Behavioral:  Negative for confusion, dysphoric mood and sleep disturbance.    Immunization History  Administered Date(s) Administered   Influenza Whole 06/15/2018   Influenza,inj,Quad PF,6+ Mos  06/25/2013   Influenza-Unspecified 06/23/2020, 06/30/2021   Moderna Sars-Covid-2 Vaccination 09/13/2019, 10/11/2019, 07/20/2020, 02/08/2021   Pneumococcal Conjugate-13 03/11/2014   Pneumococcal Polysaccharide-23 09/11/1986, 09/11/2001, 06/25/2013   Tdap 03/16/2011   Unspecified SARS-COV-2 Vaccination 05/31/2021   Zoster Recombinat (Shingrix) 09/12/2007   Pertinent  Health Maintenance Due  Topic Date Due   INFLUENZA VACCINE  Completed   DEXA SCAN  Completed   Fall Risk 06/16/2019 06/16/2019 06/17/2019 06/17/2019 06/18/2019  Falls in the past year? - - - - -  Was there an injury with Fall? - - - - -  Patient Fall Risk Level High fall risk High fall risk High fall risk High fall risk High fall risk   Functional Status Survey:    Vitals:   08/23/21 1502  BP: 134/62  Pulse: 72  Resp: 20  Temp: 97.8 F (36.6 C)  SpO2: 93%  Weight: 169 lb 12.8 oz (77 kg)  Height: 4\' 8"  (1.422 m)   Body mass index is 38.07 kg/m. Physical Exam Vitals reviewed.  Constitutional:      Appearance: Normal appearance.  HENT:     Head: Normocephalic.     Nose: Nose normal.     Mouth/Throat:     Mouth: Mucous membranes are moist.     Pharynx: Oropharynx is clear.  Eyes:     Pupils: Pupils are equal, round, and reactive to light.  Cardiovascular:     Rate and Rhythm: Normal rate and regular rhythm.     Pulses: Normal pulses.     Heart sounds: Normal heart sounds. No murmur heard. Pulmonary:     Effort: Pulmonary effort is normal.     Breath sounds: Normal breath sounds.  Abdominal:     General: Abdomen is flat. Bowel sounds are normal.     Palpations: Abdomen is soft.  Musculoskeletal:        General: No swelling.     Cervical back: Neck supple.  Skin:    General: Skin is warm.  Neurological:     General: No focal deficit present.     Mental Status: She is alert.  Psychiatric:        Mood and Affect: Mood normal.        Thought Content: Thought content normal.    Labs reviewed: Recent  Labs    12/24/20 0000  NA 137  K 4.2  CL 103  CO2 25*  BUN 12  CREATININE 0.7  CALCIUM 8.9   Recent Labs    12/24/20 0000  AST 19  ALT 14  ALKPHOS 71  ALBUMIN 3.6   Recent Labs    12/24/20 0000  WBC 8.0  NEUTROABS 3,848.00  HGB 15.6  HCT 47*  PLT 194   Lab Results  Component Value Date   TSH 2.11 07/02/2020   Lab Results  Component Value Date   HGBA1C 5.7 06/18/2018   Lab Results  Component Value Date  CHOL 188 01/30/2018   HDL 64 01/30/2018   LDLCALC 106 (H) 01/30/2018   TRIG 89 01/30/2018   CHOLHDL 2.9 01/30/2018    Significant Diagnostic Results in last 30 days:  No results found.  Assessment/Plan 1. Acquired hypothyroidism Repeat TSH  2. Depression, recurrent ([place]) Stable on Lexapro  3. Gait abnormality Continues to do well with her walker  4. Vascular dementia without behavioral disturbance (HCC) Stable in AL with her Aricept On Low dose of Aspirin   5. Hyponatremia Repeat BMP    Family/ staff Communication:   Labs/tests ordered:  CBC,CMP,TSH

## 2021-09-09 DIAGNOSIS — M85841 Other specified disorders of bone density and structure, right hand: Secondary | ICD-10-CM | POA: Diagnosis not present

## 2021-09-16 DIAGNOSIS — G4700 Insomnia, unspecified: Secondary | ICD-10-CM | POA: Diagnosis not present

## 2021-09-16 DIAGNOSIS — F330 Major depressive disorder, recurrent, mild: Secondary | ICD-10-CM | POA: Diagnosis not present

## 2021-09-16 DIAGNOSIS — R419 Unspecified symptoms and signs involving cognitive functions and awareness: Secondary | ICD-10-CM | POA: Diagnosis not present

## 2021-09-16 DIAGNOSIS — F411 Generalized anxiety disorder: Secondary | ICD-10-CM | POA: Diagnosis not present

## 2021-09-22 DIAGNOSIS — I10 Essential (primary) hypertension: Secondary | ICD-10-CM | POA: Diagnosis not present

## 2021-09-22 LAB — TSH: TSH: 0.52 (ref 0.41–5.90)

## 2021-11-04 DIAGNOSIS — F330 Major depressive disorder, recurrent, mild: Secondary | ICD-10-CM | POA: Diagnosis not present

## 2021-11-04 DIAGNOSIS — G4700 Insomnia, unspecified: Secondary | ICD-10-CM | POA: Diagnosis not present

## 2021-11-04 DIAGNOSIS — R419 Unspecified symptoms and signs involving cognitive functions and awareness: Secondary | ICD-10-CM | POA: Diagnosis not present

## 2021-11-04 DIAGNOSIS — F411 Generalized anxiety disorder: Secondary | ICD-10-CM | POA: Diagnosis not present

## 2021-11-10 ENCOUNTER — Encounter: Payer: Self-pay | Admitting: Nurse Practitioner

## 2021-11-10 ENCOUNTER — Non-Acute Institutional Stay: Payer: Medicare (Managed Care) | Admitting: Nurse Practitioner

## 2021-11-10 DIAGNOSIS — E039 Hypothyroidism, unspecified: Secondary | ICD-10-CM | POA: Diagnosis not present

## 2021-11-10 DIAGNOSIS — R269 Unspecified abnormalities of gait and mobility: Secondary | ICD-10-CM | POA: Diagnosis not present

## 2021-11-10 DIAGNOSIS — F339 Major depressive disorder, recurrent, unspecified: Secondary | ICD-10-CM | POA: Diagnosis not present

## 2021-11-10 DIAGNOSIS — F0390 Unspecified dementia without behavioral disturbance: Secondary | ICD-10-CM | POA: Diagnosis not present

## 2021-11-10 NOTE — Progress Notes (Signed)
?Location:   [facility] ?Nursing Home Room Number: 453 ?Place of Service:  ALF (13) ?Provider:  Marlana Latus NP  ? ?[name], MD ? ?Patient Care Team: ?[name], MD as PCP - General (Internal Medicine) ?Shawnique Mariotti X, NP as Nurse Practitioner (Internal Medicine) ? ?Extended Emergency Contact Information ?Primary Emergency Contact: Reynolds,[name] ?Address: [address] ?         [place], [place] [address] [place] ?Home Phone: [phone] ?Mobile Phone: [phone] ?Relation: Son ?Secondary Emergency Contact: Cessna,Brett ?Address: [address] ?         [place], [place] [address] [place] ?Home Phone: [phone] ?Work Phone: [phone] ?Mobile Phone: [phone] ?Relation: Son ? ?Code Status:  DNR Managed Care ?Goals of care: Advanced Directive information ?Advanced Directives 11/10/2021  ?Does Patient Have a Medical Advance Directive? Yes  ?Type of Advance Directive Out of facility DNR (pink MOST or yellow form);Living will;Healthcare Power of Attorney  ?Does patient want to make changes to medical advance directive? No - Patient declined  ?Copy of [place] in Chart? Yes - validated most recent copy scanned in chart (See row information)  ?Pre-existing out of facility DNR order (yellow form or pink MOST form) Pink MOST form placed in chart (order not valid for inpatient use);Yellow form placed in chart (order not valid for inpatient use)  ? ? ? ?Chief Complaint  ?Patient presents with  ? Medical Management of Chronic Issues  ? Quality Metric Gaps  ?  Zoster Vaccines- Shingrix (2 of 2) ?2022 TETANUS/TDAP ?NCIR/Matrix verified ? ?  ? ? ?HPI:  ?Pt is a 88 y.o. female seen today for medical management of chronic diseases.   ?  ?  Gait abnormality, uses walker ?            Dementia, resides in AL [facility] for safety, care assistance, on Donepezil for memory. MMSE 22/30 08/22/21 ?            Her mood is stable, on Escitalopram ?             Hypothyroidism, takes Levothyroxine TSH 0.52 09/22/21 ? ?Past Medical History:  ?Diagnosis Date  ? Eczema   ? Hypothyroid   ? Hypothyroidism   ? Hypothyroidism   ? Vitamin D deficiency   ? ?Past Surgical History:  ?Procedure Laterality Date  ? CHOLECYSTECTOMY N/A 06/24/2013  ? Procedure: LAPAROSCOPIC CHOLECYSTECTOMY WITH INTRAOPERATIVE CHOLANGIOGRAM;  Surgeon: Ralene Ok, MD;  Location: [place];  Service: General;  Laterality: N/A;  ? TONSILLECTOMY    ? ? ?No Known Allergies ? ?Allergies as of 11/10/2021   ?No Known Allergies ?  ? ?  ?Medication List  ?  ? ?  ? Accurate as of November 10, 2021 11:59 PM. If you have any questions, ask your nurse or doctor.  ?  ?  ? ?  ? ?aspirin EC 81 MG tablet ?Take 1 tablet (81 mg total) by mouth daily. ?  ?calcium-vitamin D 500-200 MG-UNIT tablet ?Commonly known as: OSCAL WITH D ?Take 1 tablet by mouth 2 (two) times daily. Once daily ?  ?donepezil 5 MG tablet ?Commonly known as: ARICEPT ?Take 1 tablet (5 mg total) by mouth at bedtime. ?  ?escitalopram 10 MG tablet ?Commonly known as: LEXAPRO ?Take 15 mg by mouth daily. (1 & 1/2 tablet) ?  ?levothyroxine 100 MCG tablet ?Commonly known as: SYNTHROID ?Take 100 mcg by mouth daily before breakfast. ?  ?melatonin 1 MG Tabs tablet ?  Take 1 mg by mouth at bedtime. ?  ? ?  ? ? ?Review of Systems  ?Unable to perform ROS: Dementia  ? ?Immunization History  ?Administered Date(s) Administered  ? Influenza Whole 06/15/2018  ? Influenza,inj,Quad PF,6+ Mos 06/25/2013  ? Influenza-Unspecified 06/23/2020, 06/30/2021  ? Moderna Sars-Covid-2 Vaccination 09/13/2019, 10/11/2019, 07/20/2020, 02/08/2021  ? Pneumococcal Conjugate-13 03/11/2014  ? Pneumococcal Polysaccharide-23 09/11/1986, 09/11/2001, 06/25/2013  ? Tdap 03/16/2011  ? Unspecified SARS-COV-2 Vaccination 05/31/2021  ? Zoster Recombinat (Shingrix) 09/12/2007  ? ?Pertinent  Health Maintenance Due  ?Topic Date Due  ? INFLUENZA VACCINE  Completed  ? DEXA SCAN  Completed  ? ?Fall Risk 06/16/2019  06/16/2019 06/17/2019 06/17/2019 06/18/2019  ?Falls in the past year? - - - - -  ?Was there an injury with Fall? - - - - -  ?Patient Fall Risk Level High fall risk High fall risk High fall risk High fall risk High fall risk  ? ?Functional Status Survey: ?  ? ?Vitals:  ? 11/10/21 1602  ?BP: 121/76  ?Pulse: 76  ?Resp: (!) 24  ?Temp: (!) 97.4 ?F (36.3 ?C)  ?SpO2: 93%  ?Weight: 169 lb 3.2 oz (76.7 kg)  ?Height: 4\' 8"  (1.422 m)  ? ?Body mass index is 37.93 kg/m?Marland Kitchen ?Physical Exam ?Vitals and nursing note reviewed.  ?Constitutional:   ?   Appearance: Normal appearance.  ?HENT:  ?   Head: Normocephalic and atraumatic.  ?   Mouth/Throat:  ?   Mouth: Mucous membranes are moist.  ?Eyes:  ?   Extraocular Movements: Extraocular movements intact.  ?   Conjunctiva/sclera: Conjunctivae normal.  ?   Pupils: Pupils are equal, round, and reactive to light.  ?Cardiovascular:  ?   Rate and Rhythm: Normal rate and regular rhythm.  ?   Heart sounds: No murmur heard. ?Pulmonary:  ?   Breath sounds: No rales.  ?Abdominal:  ?   General: Bowel sounds are normal.  ?   Palpations: Abdomen is soft.  ?   Tenderness: There is no abdominal tenderness.  ?   Hernia: A hernia is present.  ?   Comments: Umbilical hernia.   ?Musculoskeletal:  ?   Cervical back: Normal range of motion and neck supple.  ?   Right lower leg: No edema.  ?   Left lower leg: No edema.  ?Skin: ?   General: Skin is warm and dry.  ?   Findings: Rash present.  ?   Comments: Redness mid back, under breast mostly R, R groin, itching  ?Neurological:  ?   General: No focal deficit present.  ?   Mental Status: She is alert. Mental status is at baseline.  ?   Gait: Gait abnormal.  ?   Comments: Oriented to person, place.   ?Psychiatric:     ?   Mood and Affect: Mood normal.     ?   Behavior: Behavior normal.  ? ? ?Labs reviewed: ?Recent Labs  ?  12/24/20 ?0000  ?NA 137  ?K 4.2  ?CL 103  ?CO2 25*  ?BUN 12  ?CREATININE 0.7  ?CALCIUM 8.9  ? ?Recent Labs  ?  12/24/20 ?0000  ?AST 19  ?ALT 14   ?ALKPHOS 71  ?ALBUMIN 3.6  ? ?Recent Labs  ?  12/24/20 ?0000  ?WBC 8.0  ?NEUTROABS 3,848.00  ?HGB 15.6  ?HCT 47*  ?PLT 194  ? ?Lab Results  ?Component Value Date  ? TSH 0.52 09/22/2021  ? ?Lab Results  ?Component Value Date  ? HGBA1C  5.7 06/18/2018  ? ?Lab Results  ?Component Value Date  ? CHOL 188 01/30/2018  ? HDL 64 01/30/2018  ? LDLCALC 106 (H) 01/30/2018  ? TRIG 89 01/30/2018  ? CHOLHDL 2.9 01/30/2018  ? ? ?Significant Diagnostic Results in last 30 days:  ?No results found. ? ?Assessment/Plan ?Dementia without behavioral disturbance ([place]) ?resides in AL [facility] for safety, care assistance, on Donepezil for memory. MMSE 22/30 08/22/21 ? ?Hypothyroidism ? takes Levothyroxine TSH 0.52 09/22/21 ? ?Depression, recurrent ([place]) ? Her mood is stable, on Escitalopram ? ?Gait abnormality ?Ambulates with walker.  ? ? ? ? ?Family/ staff Communication: plan of care reviewed with the patient and charge nurse.  ? ?Labs/tests ordered:  none ? ?Time spend 40 minutes.  ? ?  ?

## 2021-11-11 ENCOUNTER — Encounter: Payer: Self-pay | Admitting: Nurse Practitioner

## 2021-11-11 NOTE — Assessment & Plan Note (Addendum)
Her mood is stable, on Escitalopram ?

## 2021-11-11 NOTE — Assessment & Plan Note (Signed)
Ambulates with walker.  

## 2021-11-11 NOTE — Assessment & Plan Note (Signed)
resides in AL [facility] for safety, care assistance, on Donepezil for memory. MMSE 22/30 08/22/21 ?

## 2021-11-11 NOTE — Assessment & Plan Note (Signed)
takes Levothyroxine TSH 0.52 09/22/21 ?

## 2021-12-02 DIAGNOSIS — F411 Generalized anxiety disorder: Secondary | ICD-10-CM | POA: Diagnosis not present

## 2021-12-02 DIAGNOSIS — F02A0 Dementia in other diseases classified elsewhere, mild, without behavioral disturbance, psychotic disturbance, mood disturbance, and anxiety: Secondary | ICD-10-CM | POA: Diagnosis not present

## 2021-12-02 DIAGNOSIS — F330 Major depressive disorder, recurrent, mild: Secondary | ICD-10-CM | POA: Diagnosis not present

## 2021-12-19 ENCOUNTER — Non-Acute Institutional Stay: Payer: Medicare (Managed Care) | Admitting: Nurse Practitioner

## 2021-12-19 ENCOUNTER — Encounter: Payer: Self-pay | Admitting: Nurse Practitioner

## 2021-12-19 DIAGNOSIS — W19XXXA Unspecified fall, initial encounter: Secondary | ICD-10-CM

## 2021-12-19 DIAGNOSIS — R269 Unspecified abnormalities of gait and mobility: Secondary | ICD-10-CM

## 2021-12-19 DIAGNOSIS — E039 Hypothyroidism, unspecified: Secondary | ICD-10-CM

## 2021-12-19 DIAGNOSIS — F339 Major depressive disorder, recurrent, unspecified: Secondary | ICD-10-CM

## 2021-12-19 DIAGNOSIS — U071 COVID-19: Secondary | ICD-10-CM | POA: Diagnosis not present

## 2021-12-19 DIAGNOSIS — F0390 Unspecified dementia without behavioral disturbance: Secondary | ICD-10-CM

## 2021-12-19 NOTE — Assessment & Plan Note (Signed)
Her mood is stable, on Escitalopram ?

## 2021-12-19 NOTE — Assessment & Plan Note (Signed)
fell 12/18/21 when the patient was found on floor between the bed and wall, reported the right knee pain followed fall was resolved upon my visit today, she is able to ambulate without c/o pain.  ?Close supervision, assistance for safety.  ?

## 2021-12-19 NOTE — Assessment & Plan Note (Signed)
resides in AL [facility] for safety, care assistance, on Donepezil for memory. MMSE 22/30 08/22/21 ?

## 2021-12-19 NOTE — Progress Notes (Signed)
?Location:   [facility] ?Nursing Home Room Number: 518A ?Place of Service:  ALF (13) ?Provider:  Niccolo Burggraf X, NP ? ?Lisa Dad, MD ? ?Patient Care Team: ?Lisa Dad, MD as PCP - General (Internal Medicine) ?Siniya Lichty X, NP as Nurse Practitioner (Internal Medicine) ? ?Extended Emergency Contact Information ?Primary Emergency Contact: Kimmet,[name] ?Address: [address] ?         [place], [place] [address] [place] ?Home Phone: [phone] ?Mobile Phone: [phone] ?Relation: Son ?Secondary Emergency Contact: Stevison,Brett ?Address: [address] ?         [place],  [address] [place] ?Home Phone: [phone] ?Work Phone: [phone] ?Mobile Phone: [phone] ?Relation: Son ? ?Code Status:  DNR ?Goals of care: Advanced Directive information ? ?  11/10/2021  ?  4:08 PM  ?Advanced Directives  ?Does Patient Have a Medical Advance Directive? Yes  ?Type of Advance Directive Out of facility DNR (pink MOST or yellow form);Living will;Healthcare Power of Attorney  ?Does patient want to make changes to medical advance directive? No - Patient declined  ?Copy of [place] in Chart? Yes - validated most recent copy scanned in chart (See row information)  ?Pre-existing out of facility DNR order (yellow form or pink MOST form) Pink MOST form placed in chart (order not valid for inpatient use);Yellow form placed in chart (order not valid for inpatient use)  ? ? ? ?Chief Complaint  ?Patient presents with  ? Acute Visit  ?  COVID  ? ? ?HPI:  ?Pt is a 88 y.o. female seen today for cough, congestion, poor appetite, generalized weakness, fell 12/18/21 when the patient was found on floor between the bed and wall, reported the right knee pain followed fall was resolved upon my visit today, she is able to ambulate without c/o pain.  ? ? Gait abnormality, uses walker ?            Dementia, resides in [facility] for safety, care assistance, on Donepezil for  memory. MMSE 22/30 08/22/21 ?            Her mood is stable, on Escitalopram ?            Hypothyroidism, takes Levothyroxine TSH 0.52 09/22/21 ?Past Medical History:  ?Diagnosis Date  ? Eczema   ? Hypothyroid   ? Hypothyroidism   ? Hypothyroidism   ? Vitamin D deficiency   ? ?Past Surgical History:  ?Procedure Laterality Date  ? CHOLECYSTECTOMY N/A 06/24/2013  ? Procedure: LAPAROSCOPIC CHOLECYSTECTOMY WITH INTRAOPERATIVE CHOLANGIOGRAM;  Surgeon: Ralene Ok, MD;  Location: [place];  Service: General;  Laterality: N/A;  ? TONSILLECTOMY    ? ? ?No Known Allergies ? ?Allergies as of 12/19/2021   ?No Known Allergies ?  ? ?  ?Medication List  ?  ? ?  ? Accurate as of December 19, 2021  3:26 PM. If you have any questions, ask your nurse or doctor.  ?  ?  ? ?  ? ?aspirin EC 81 MG tablet ?Take 1 tablet (81 mg total) by mouth daily. ?  ?calcium-vitamin D 500-200 MG-UNIT tablet ?Commonly known as: OSCAL WITH D ?Take 1 tablet by mouth 2 (two) times daily. Once daily ?  ?donepezil 5 MG tablet ?Commonly known as: ARICEPT ?Take 1 tablet (5 mg total) by mouth at bedtime. ?  ?escitalopram 10 MG tablet ?Commonly known as: LEXAPRO ?Take 15 mg by mouth daily. (1 & 1/2 tablet) ?  ?Lagevrio 200  MG Caps capsule ?Generic drug: molnupiravir EUA ?Take 4 capsules by mouth 2 (two) times daily. ?  ?levothyroxine 100 MCG tablet ?Commonly known as: SYNTHROID ?Take 100 mcg by mouth daily before breakfast. ?  ?melatonin 1 MG Tabs tablet ?Take 1 mg by mouth at bedtime. ?  ? ?  ? ? ?Review of Systems  ?Unable to perform ROS: Dementia  ? ?Immunization History  ?Administered Date(s) Administered  ? Influenza Whole 06/15/2018  ? Influenza,inj,Quad PF,6+ Mos 06/25/2013  ? Influenza-Unspecified 06/23/2020, 06/30/2021  ? Moderna Sars-Covid-2 Vaccination 09/13/2019, 10/11/2019, 07/20/2020, 02/08/2021  ? Pneumococcal Conjugate-13 03/11/2014  ? Pneumococcal Polysaccharide-23 09/11/1986, 09/11/2001, 06/25/2013  ? Tdap 03/16/2011  ? Unspecified SARS-COV-2  Vaccination 05/31/2021  ? Zoster Recombinat (Shingrix) 09/12/2007  ? ?Pertinent  Health Maintenance Due  ?Topic Date Due  ? INFLUENZA VACCINE  04/11/2022  ? DEXA SCAN  Completed  ? ? ?  06/16/2019  ? 12:00 PM 06/16/2019  ?  8:17 PM 06/17/2019  ?  8:03 AM 06/17/2019  ?  7:28 PM 06/18/2019  ?  7:46 AM  ?Fall Risk  ?Patient Fall Risk Level High fall risk High fall risk High fall risk High fall risk High fall risk  ? ?Functional Status Survey: ?  ? ?Vitals:  ? 12/19/21 1334  ?BP: 118/78  ?Pulse: 66  ?Resp: 19  ?Temp: 98.7 ?F (37.1 ?C)  ?SpO2: 92%  ?Weight: 169 lb 12.8 oz (77 kg)  ?Height: $RemoveB'4\' 8"'dKSGcIvr$  (1.422 m)  ? ?Body mass index is 38.07 kg/m?Marland Kitchen ?Physical Exam ?Vitals and nursing note reviewed.  ?Constitutional:   ?   Comments: Tired  ?HENT:  ?   Head: Normocephalic and atraumatic.  ?   Nose: Congestion and rhinorrhea present.  ?   Mouth/Throat:  ?   Mouth: Mucous membranes are dry.  ?   Pharynx: No oropharyngeal exudate or posterior oropharyngeal erythema.  ?Eyes:  ?   Extraocular Movements: Extraocular movements intact.  ?   Conjunctiva/sclera: Conjunctivae normal.  ?   Pupils: Pupils are equal, round, and reactive to light.  ?Cardiovascular:  ?   Rate and Rhythm: Normal rate and regular rhythm.  ?   Heart sounds: No murmur heard. ?Pulmonary:  ?   Effort: Pulmonary effort is normal.  ?   Breath sounds: No wheezing, rhonchi or rales.  ?   Comments: Cough, central congestion ?Abdominal:  ?   General: Bowel sounds are normal.  ?   Palpations: Abdomen is soft.  ?   Tenderness: There is no abdominal tenderness.  ?   Hernia: A hernia is present.  ?   Comments: Umbilical hernia.   ?Musculoskeletal:  ?   Cervical back: Normal range of motion and neck supple.  ?   Right lower leg: No edema.  ?   Left lower leg: No edema.  ?Skin: ?   General: Skin is warm and dry.  ?Neurological:  ?   General: No focal deficit present.  ?   Mental Status: She is alert. Mental status is at baseline.  ?   Gait: Gait abnormal.  ?   Comments: Oriented to  person, place.   ?Psychiatric:     ?   Mood and Affect: Mood normal.     ?   Behavior: Behavior normal.  ? ? ?Labs reviewed: ?Recent Labs  ?  12/24/20 ?0000  ?NA 137  ?K 4.2  ?CL 103  ?CO2 25*  ?BUN 12  ?CREATININE 0.7  ?CALCIUM 8.9  ? ?Recent Labs  ?  12/24/20 ?  0000  ?AST 19  ?ALT 14  ?ALKPHOS 71  ?ALBUMIN 3.6  ? ?Recent Labs  ?  12/24/20 ?0000  ?WBC 8.0  ?NEUTROABS 3,848.00  ?HGB 15.6  ?HCT 47*  ?PLT 194  ? ?Lab Results  ?Component Value Date  ? TSH 0.52 09/22/2021  ? ?Lab Results  ?Component Value Date  ? HGBA1C 5.7 06/18/2018  ? ?Lab Results  ?Component Value Date  ? CHOL 188 01/30/2018  ? HDL 64 01/30/2018  ? LDLCALC 106 (H) 01/30/2018  ? TRIG 89 01/30/2018  ? CHOLHDL 2.9 01/30/2018  ? ? ?Significant Diagnostic Results in last 30 days:  ?No results found. ? ?Assessment/Plan ?COVID-19 virus infection ?cough, congestion, poor appetite, generalized weakness. CXR 12/17/21 showed no pneumonia, there is pulmonary venous hypertension.  ?Started Molnupiravir 800mg  bid x5 days started 12/17/21 ?Will update CBC/diff, CMP/eGFR, observe for s/s of pulmonary venous hypertension, may gentle diurese given the patient's poor oral intake.  ? ?Fall ?fell 12/18/21 when the patient was found on floor between the bed and wall, reported the right knee pain followed fall was resolved upon my visit today, she is able to ambulate without c/o pain.  ?Close supervision, assistance for safety.  ? ?Gait abnormality ?uses walker ? ?Dementia without behavioral disturbance ([place]) ? resides in [facility] for safety, care assistance, on Donepezil for memory. MMSE 22/30 08/22/21 ? ?Depression, recurrent ([place]) ?Her mood is stable, on Escitalopram ? ?Hypothyroidism ? takes Levothyroxine TSH 0.52 09/22/21 ? ? ? ? ?Family/ staff Communication: plan of care reviewed with the patient and charge nurse.  ? ?Labs/tests ordered:  CBC/diff, CMP/eGFR ? ?Time spend 40 minutes.  ? ?  ?

## 2021-12-19 NOTE — Assessment & Plan Note (Signed)
takes Levothyroxine TSH 0.52 09/22/21 ?

## 2021-12-19 NOTE — Assessment & Plan Note (Signed)
uses walker.  

## 2021-12-19 NOTE — Assessment & Plan Note (Signed)
cough, congestion, poor appetite, generalized weakness. CXR 12/17/21 showed no pneumonia, there is pulmonary venous hypertension.  ?Started Molnupiravir 836m bid x5 days started 12/17/21 ?Will update CBC/diff, CMP/eGFR, observe for s/s of pulmonary venous hypertension, may gentle diurese given the patient's poor oral intake.  ?

## 2021-12-20 LAB — CBC AND DIFFERENTIAL
HCT: 45 (ref 36–46)
Hemoglobin: 15 (ref 12.0–16.0)
Neutrophils Absolute: 1622
Platelets: 153 10*3/uL (ref 150–400)
WBC: 5.2

## 2021-12-20 LAB — BASIC METABOLIC PANEL
BUN: 14 (ref 4–21)
CO2: 25 — AB (ref 13–22)
Chloride: 108 (ref 99–108)
Creatinine: 0.7 (ref 0.5–1.1)
Glucose: 93
Potassium: 4.3 mEq/L (ref 3.5–5.1)
Sodium: 140 (ref 137–147)

## 2021-12-20 LAB — HEPATIC FUNCTION PANEL
ALT: 14 U/L (ref 7–35)
AST: 18 (ref 13–35)
Alkaline Phosphatase: 54 (ref 25–125)
Bilirubin, Total: 0.9

## 2021-12-20 LAB — CBC: RBC: 4.53 (ref 3.87–5.11)

## 2021-12-20 LAB — COMPREHENSIVE METABOLIC PANEL
Albumin: 2.9 — AB (ref 3.5–5.0)
Calcium: 8.2 — AB (ref 8.7–10.7)
eGFR: 79

## 2021-12-22 ENCOUNTER — Emergency Department (HOSPITAL_COMMUNITY): Payer: Medicare (Managed Care)

## 2021-12-22 ENCOUNTER — Other Ambulatory Visit: Payer: Self-pay

## 2021-12-22 ENCOUNTER — Encounter (HOSPITAL_COMMUNITY): Payer: Self-pay | Admitting: *Deleted

## 2021-12-22 ENCOUNTER — Emergency Department (HOSPITAL_COMMUNITY)
Admission: EM | Admit: 2021-12-22 | Discharge: 2021-12-22 | Disposition: A | Discharge status: Home / Self Care | Payer: Medicare (Managed Care) | Attending: Emergency Medicine | Admitting: Emergency Medicine

## 2021-12-22 DIAGNOSIS — S0083XA Contusion of other part of head, initial encounter: Secondary | ICD-10-CM | POA: Diagnosis not present

## 2021-12-22 DIAGNOSIS — Z7982 Long term (current) use of aspirin: Secondary | ICD-10-CM | POA: Insufficient documentation

## 2021-12-22 DIAGNOSIS — F0390 Unspecified dementia without behavioral disturbance: Secondary | ICD-10-CM | POA: Insufficient documentation

## 2021-12-22 DIAGNOSIS — Z23 Encounter for immunization: Secondary | ICD-10-CM | POA: Insufficient documentation

## 2021-12-22 DIAGNOSIS — W19XXXA Unspecified fall, initial encounter: Secondary | ICD-10-CM

## 2021-12-22 DIAGNOSIS — Y92199 Unspecified place in other specified residential institution as the place of occurrence of the external cause: Secondary | ICD-10-CM | POA: Insufficient documentation

## 2021-12-22 DIAGNOSIS — R55 Syncope and collapse: Secondary | ICD-10-CM | POA: Diagnosis present

## 2021-12-22 DIAGNOSIS — S51012A Laceration without foreign body of left elbow, initial encounter: Secondary | ICD-10-CM | POA: Insufficient documentation

## 2021-12-22 LAB — BASIC METABOLIC PANEL
Anion gap: 8 (ref 5–15)
BUN: 10 mg/dL (ref 8–23)
CO2: 23 mmol/L (ref 22–32)
Calcium: 8.8 mg/dL — ABNORMAL LOW (ref 8.9–10.3)
Chloride: 109 mmol/L (ref 98–111)
Creatinine, Ser: 0.68 mg/dL (ref 0.44–1.00)
GFR, Estimated: >60 mL/min (ref >60)
Glucose, Bld: 111 mg/dL — ABNORMAL HIGH (ref 70–99)
Potassium: 3.8 mmol/L (ref 3.5–5.1)
Sodium: 140 mmol/L (ref 135–145)

## 2021-12-22 LAB — CBC WITH DIFFERENTIAL/PLATELET
Abs Immature Granulocytes: 0.03 10*3/uL (ref 0.00–0.07)
Basophils Absolute: 0.1 10*3/uL (ref 0.0–0.1)
Basophils Relative: 1 %
Eosinophils Absolute: 0.2 10*3/uL (ref 0.0–0.5)
Eosinophils Relative: 2 %
HCT: 48.4 % — ABNORMAL HIGH (ref 36.0–46.0)
Hemoglobin: 16.4 g/dL — ABNORMAL HIGH (ref 12.0–15.0)
Immature Granulocytes: 0 %
Lymphocytes Relative: 29 %
Lymphs Abs: 2.2 10*3/uL (ref 0.7–4.0)
MCH: 33.2 pg (ref 26.0–34.0)
MCHC: 33.9 g/dL (ref 30.0–36.0)
MCV: 98 fL (ref 80.0–100.0)
Monocytes Absolute: 0.7 10*3/uL (ref 0.1–1.0)
Monocytes Relative: 9 %
Neutro Abs: 4.4 10*3/uL (ref 1.7–7.7)
Neutrophils Relative %: 59 %
Platelets: 166 10*3/uL (ref 150–400)
RBC: 4.94 MIL/uL (ref 3.87–5.11)
RDW: 12.5 % (ref 11.5–15.5)
WBC: 7.5 10*3/uL (ref 4.0–10.5)
nRBC: 0 % (ref 0.0–0.2)

## 2021-12-22 MED ORDER — TETANUS-DIPHTH-ACELL PERTUSSIS 5-2.5-18.5 LF-MCG/0.5 IM SUSY
0.5000 mL | PREFILLED_SYRINGE | Freq: Once | INTRAMUSCULAR | Status: AC
  Administered 2021-12-22: 0.5 mL via INTRAMUSCULAR
  Filled 2021-12-22: qty 0.5

## 2021-12-22 MED ORDER — ACETAMINOPHEN 500 MG PO TABS
1000.0000 mg | ORAL_TABLET | Freq: Once | ORAL | Status: AC
  Administered 2021-12-22: 1000 mg via ORAL
  Filled 2021-12-22: qty 2

## 2021-12-22 NOTE — ED Provider Notes (Signed)
?[facility] ?Provider Note ? ? ?CSN: [id number] ?Arrival date & time: 12/22/21  0846 ? ?  ? ?History ? ?Chief Complaint  ?Patient presents with  ? Fall  ? ?Patient ?Lisa Barrera is a 88 y.o. female.  Presenting to the emergency room with concern for fall. ? ?Patient has dementia which limits history but she is able to provide some basic answers.  She does not recall details of fall, she does not believe she has passed out.  Lives at assisted living facility. ? ?History is also obtained from son.  States that she is acting appropriately, pleasant this afternoon.  At her baseline mental status. ? ?HPI ? ?  ? ?Home Medications ?Prior to Admission medications   ?Medication Sig Start Date End Date Taking? Authorizing Provider  ?aspirin EC 81 MG tablet Take 1 tablet (81 mg total) by mouth daily. ?Patient taking differently: Take 81 mg by mouth every morning. 05/22/17  Yes Blanchie Serve, MD  ?calcium-vitamin D (OSCAL WITH D) 500-200 MG-UNIT tablet Take 1 tablet by mouth 2 (two) times daily.   Yes [provider]  ?donepezil (ARICEPT) 5 MG tablet Take 1 tablet (5 mg total) by mouth at bedtime. 06/26/17  Yes Blanchie Serve, MD  ?escitalopram (LEXAPRO) 10 MG tablet Take 15 mg by mouth every morning.   Yes [provider]  ?levothyroxine (SYNTHROID, LEVOTHROID) 100 MCG tablet Take 100 mcg by mouth daily before breakfast.   Yes [provider]  ?melatonin 1 MG TABS tablet Take 1 mg by mouth at bedtime.   Yes [provider]  ?molnupiravir EUA (LAGEVRIO) 200 MG CAPS capsule Take 4 capsules by mouth 2 (two) times daily.   Yes [provider]  ?   ? ?Allergies    ?Patient has no known allergies.   ? ?Review of Systems   ?Review of Systems  ?Unable to perform ROS: Dementia  ? ?Physical Exam ?Updated Vital Signs ?BP 129/73   Pulse (!) 56   Temp 97.8 ?F (36.6 ?C) (Temporal)   Resp 18   SpO2 92%  ?Physical Exam ?Vitals and nursing note  reviewed.  ?Constitutional:   ?   General: She is not in acute distress. ?   Appearance: She is well-developed.  ?HENT:  ?   Head: Normocephalic.  ?   Comments: Bruising over left face, eye appears normal, pupils are equal round and reactive bilaterally ?Eyes:  ?   Conjunctiva/sclera: Conjunctivae normal.  ?Cardiovascular:  ?   Rate and Rhythm: Normal rate and regular rhythm.  ?   Heart sounds: No murmur heard. ?Pulmonary:  ?   Effort: Pulmonary effort is normal. No respiratory distress.  ?   Breath sounds: Normal breath sounds.  ?Abdominal:  ?   Palpations: Abdomen is soft.  ?   Tenderness: There is no abdominal tenderness.  ?Musculoskeletal:     ?   General: No swelling.  ?   Cervical back: Neck supple.  ?   Comments: Back: no C, T, L spine TTP, no step off or deformity ?RUE: no TTP throughout, no deformity, normal joint ROM, radial pulse intact, distal sensation and motor intact ?LUE: Superficial skin tear to left elbow, normal joint ROM, radial pulse intact, distal sensation and motor intact ?RLE:  no TTP throughout, no deformity, normal joint ROM, distal pulse, sensation and motor intact ?LLE: no TTP throughout, no deformity, normal joint ROM, distal pulse, sensation and motor intact  ?Skin: ?   General: Skin is warm  and dry.  ?   Capillary Refill: Capillary refill takes less than 2 seconds.  ?Neurological:  ?   Mental Status: She is alert.  ?   Comments: Alert, pleasant, answers basic questions appropriately  ?Psychiatric:     ?   Mood and Affect: Mood normal.  ? ? ?ED Results / Procedures / Treatments   ?Labs ?(all labs ordered are listed, but only abnormal results are displayed) ?Labs Reviewed  ?BASIC METABOLIC PANEL - Abnormal; Notable for the following components:  ?    Result Value  ? Glucose, Bld 111 (*)   ? Calcium 8.8 (*)   ? All other components within normal limits  ?CBC WITH DIFFERENTIAL/PLATELET - Abnormal; Notable for the following components:  ? Hemoglobin 16.4 (*)   ? HCT 48.4 (*)   ? All other  components within normal limits  ?URINALYSIS, ROUTINE W REFLEX MICROSCOPIC  ? ? ?EKG ?EKG Interpretation ? ?Date/Time:  Thursday December 22 2021 19:08:21 EDT ?Ventricular Rate:  65 ?PR Interval:  175 ?QRS Duration: 141 ?QT Interval:  445 ?QTC Calculation: 463 ?R Axis:   24 ?Text Interpretation: Sinus rhythm Right bundle branch block Confirmed by Madalyn Rob [phone]) on 12/22/2021 7:26:56 PM ? ?Radiology ?DG Elbow Complete Left ? ?Result Date: 12/22/2021 ?CLINICAL DATA:  Fall.  Skin tear on left elbow. EXAM: LEFT ELBOW - COMPLETE 3+ VIEW COMPARISON:  None. FINDINGS: Four view study. No evidence for an acute fracture. No subluxation or dislocation. No fat pad elevation to suggest joint effusion. Posterior skin irregularity is compatible with reported laceration. IMPRESSION: No acute bony abnormality. Electronically Signed   By: Misty Stanley M.D.   On: 12/22/2021 09:57  ? ?CT Head Wo Contrast ? ?Result Date: 12/22/2021 ?CLINICAL DATA:  Fall, large hematoma to left eye EXAM: CT HEAD WITHOUT CONTRAST CT MAXILLOFACIAL WITHOUT CONTRAST CT CERVICAL SPINE WITHOUT CONTRAST TECHNIQUE: Multidetector CT imaging of the head, cervical spine, and maxillofacial structures were performed using the standard protocol without intravenous contrast. Multiplanar CT image reconstructions of the cervical spine and maxillofacial structures were also generated. RADIATION DOSE REDUCTION: This exam was performed according to the departmental dose-optimization program which includes automated exposure control, adjustment of the mA and/or kV according to patient size and/or use of iterative reconstruction technique. COMPARISON:  CT head 11/03/2014 FINDINGS: CT HEAD FINDINGS Brain: There is no evidence of acute intracranial hemorrhage, extra-axial fluid collection, or acute infarct. There is mild for age global parenchymal volume loss with prominence of the ventricular system and extra-axial CSF spaces. Confluent hypodensity in the subcortical and  periventricular white matter likely reflects sequela of moderate chronic white matter microangiopathy. Gray-white differentiation is preserved. There is no mass lesion.  There is no mass effect or midline shift. Vascular: There is calcification of the bilateral cavernous ICAs. Skull: Normal. Negative for fracture or focal lesion. Other: None. CT MAXILLOFACIAL FINDINGS Osseous: There is no acute facial bone fracture. There is no mandibular dislocation. There is no suspicious osseous lesion. There is degenerative change of the temporomandibular joints, left worse than right. Orbits: Bilateral lens implants are in place. The globes and orbits are otherwise unremarkable. Sinuses: There is mucosal thickening in the bilateral maxillary and left sphenoid sinuses with layering fluid in the left maxillary sinus. There is no hyperdense material to suggest blood products. Soft tissues: There is a prominent left periorbital hematoma with surrounding soft tissue swelling. Other: Numerous dental caries are noted. CT CERVICAL SPINE FINDINGS Alignment: There is grade 1 anterolisthesis of C3 on C4  and C4 on C5, grade 1 retrolisthesis of C5 on C6, and grade 1 anterolisthesis of C7 on T1 likely degenerative in nature. There is no jumped or perched facets or other evidence of traumatic malalignment. Skull base and vertebrae: Skull base alignment is maintained. Vertebral body heights are preserved. There is no evidence of acute fracture. Soft tissues and spinal canal: No prevertebral fluid or swelling. No visible canal hematoma. Disc levels: There is marked disc space narrowing with associated degenerative endplate change at M4-W8 and C6-C7. There is multilevel facet arthropathy, most advanced C3-C4 and C4-C5. There is no high-grade spinal canal stenosis. Upper chest: Imaged lung apices are clear. Other: None. IMPRESSION: 1. No acute intracranial hemorrhage or calvarial fracture. 2. No acute facial bone fracture. Prominent left  periorbital hematoma with surrounding soft tissue swelling. 3. No acute fracture or traumatic malalignment of the cervical spine. Electronically Signed   By: Valetta Mole M.D.   On: 12/22/2021 09:51  ? ?CT Cervical Spin

## 2021-12-22 NOTE — ED Notes (Signed)
Called [facility] about transport ?Called [facility] to give report to nurse, was directed to voicemail, left voicemail with my callback number if they would like report ?

## 2021-12-22 NOTE — Social Work (Signed)
CSW received call from [facility] where Pt resides, to check status of Pt. ?Ms. Mines or facility can be reached after hours for any updates @ [phone] or ?[phone] ?

## 2021-12-22 NOTE — ED Notes (Signed)
Cleaned, irrigated, and dressed pts skin tear on left elbow per MD orders ?

## 2021-12-22 NOTE — ED Triage Notes (Signed)
Pt arrived by [facility] from assisted living. Patient had a fall around 0700, unsure of how she fell. Is not on blood thinners. Has skin tear on left elbow, large hematoma to left eye.  ?

## 2021-12-22 NOTE — Progress Notes (Signed)
CSW attempted to contact Ivy from [facility] to update about status. Left HIPAA compliant voicemail. ?

## 2021-12-22 NOTE — ED Provider Triage Note (Signed)
Emergency Medicine Provider Triage Evaluation Note ? ?Lisa Barrera , a 88 y.o. female  was evaluated in triage.  Pt complains of fall. ? ?Review of Systems  ?Positive: Skin tear, head injury, L elbow injury ?Negative: Cp, sob, abd pain, dysuria ? ?Physical Exam  ?BP (!) 169/74 (BP Location: Right Arm)   Pulse 64   Temp 98.2 ?F (36.8 ?C) (Oral)   Resp 18   SpO2 98%  ?Gen:   Awake, no distress   ?Resp:  Normal effort  ?MSK:   Moves extremities without difficulty  ?Other:  Large hematoma to L orbit, PERRL, EOMI, skin tear L elbow ? ?Medical Decision Making  ?Medically screening exam initiated at 9:02 AM.  Appropriate orders placed.  Lisa Barrera was informed that the remainder of the evaluation will be completed by another provider, this initial triage assessment does not replace that evaluation, and the importance of remaining in the ED until their evaluation is complete. ? ?Pt fell at assisted living this AM, has skin tear to L elbow and large hematoma to L orbit.   ?  ?Domenic Moras, PA-C ?12/22/21 0355 ? ?

## 2021-12-22 NOTE — Discharge Instructions (Signed)
Follow-up with your primary care doctor.  Come back to ER if she has additional falls, increased confusion, or other new concerning symptom. ?

## 2021-12-23 ENCOUNTER — Non-Acute Institutional Stay: Payer: Medicare (Managed Care) | Admitting: Nurse Practitioner

## 2021-12-23 ENCOUNTER — Encounter: Payer: Self-pay | Admitting: Nurse Practitioner

## 2021-12-23 DIAGNOSIS — R269 Unspecified abnormalities of gait and mobility: Secondary | ICD-10-CM

## 2021-12-23 DIAGNOSIS — E039 Hypothyroidism, unspecified: Secondary | ICD-10-CM

## 2021-12-23 DIAGNOSIS — F339 Major depressive disorder, recurrent, unspecified: Secondary | ICD-10-CM

## 2021-12-23 DIAGNOSIS — T148XXA Other injury of unspecified body region, initial encounter: Secondary | ICD-10-CM | POA: Diagnosis not present

## 2021-12-23 DIAGNOSIS — F0390 Unspecified dementia without behavioral disturbance: Secondary | ICD-10-CM

## 2021-12-23 DIAGNOSIS — U071 COVID-19: Secondary | ICD-10-CM

## 2021-12-23 NOTE — Assessment & Plan Note (Signed)
Her mood is not stable, on Escitalopram, episodes of anxious, irritation, probably due to quarantine in setting of baseline dementia. The patient was smiling, eating breakfast upon my examination today, no new focal neurological deficits noted. Bun/creat 10/0.68,  wbc 7.5, Hgb 16.4 12/22/21. Will have prn Lorazepam available to her x 14 days, update labs.  ?

## 2021-12-23 NOTE — Progress Notes (Signed)
?Location:   [facility] ?Nursing Home Room Number: 229N ?Place of Service:  ALF (13) ?Provider:  Ananda Sitzer X, NP ? ?[name], MD ? ?Patient Care Team: ?[name], MD as PCP - General (Internal Medicine) ?Camile Esters X, NP as Nurse Practitioner (Internal Medicine) ? ?Extended Emergency Contact Information ?Primary Emergency Contact: Lutze,[name] ?Address: [address] ?         [place], [place] [address] [place] ?Home Phone: [phone] ?Mobile Phone: [phone] ?Relation: Son ?Secondary Emergency Contact: Gehret,Brett ?Address: [address] ?         [place], [place] [address] [place] ?Home Phone: [phone] ?Work Phone: [phone] ?Mobile Phone: [phone] ?Relation: Son ? ?Code Status:  DNR ?Goals of care: Advanced Directive information ? ?  12/23/2021  ?  1:51 PM  ?Advanced Directives  ?Does Patient Have a Medical Advance Directive? Yes  ?Type of Paramedic of [place];Living will;Out of facility DNR (pink MOST or yellow form)  ?Does patient want to make changes to medical advance directive? No - Patient declined  ?Copy of [place] in Chart? Yes - validated most recent copy scanned in chart (See row information)  ?Pre-existing out of facility DNR order (yellow form or pink MOST form) Pink MOST form placed in chart (order not valid for inpatient use);Yellow form placed in chart (order not valid for inpatient use)  ? ? ? ?Chief Complaint  ?Patient presents with  ? Acute Visit  ?  Anxiety  ? ? ?HPI:  ?Pt is a 88 y.o. female seen today for an acute visit for episodes of anxious, irritation, probably due to quarantine in setting of baseline dementia. The patient was smiling, eating breakfast upon my examination today, no new focal neurological deficits noted. Bun/creat 10/0.68,  wbc 7.5, Hgb 16.4 12/22/21 ?S/p fall, left orbital hematoma, ED evaluation 12/22/21, CT head, cervical spine, maxillofacial showed no  acute process.  ?COVID improving, completing  5 day course of Molnupiravir ? Gait abnormality, uses walker ?            Dementia, lack of safety awareness,  resides in [facility] for safety, care assistance, on Donepezil for memory. MMSE 22/30 08/22/21 ?            Her mood is not stable, on Escitalopram ?            Hypothyroidism, takes Levothyroxine TSH 0.52 09/22/21 ? ?Past Medical History:  ?Diagnosis Date  ? Eczema   ? Hypothyroid   ? Hypothyroidism   ? Hypothyroidism   ? Vitamin D deficiency   ? ?Past Surgical History:  ?Procedure Laterality Date  ? CHOLECYSTECTOMY N/A 06/24/2013  ? Procedure: LAPAROSCOPIC CHOLECYSTECTOMY WITH INTRAOPERATIVE CHOLANGIOGRAM;  Surgeon: Ralene Ok, MD;  Location: [facility];  Service: General;  Laterality: N/A;  ? TONSILLECTOMY    ? ? ?No Known Allergies ? ?Allergies as of 12/23/2021   ?No Known Allergies ?  ? ?  ?Medication List  ?  ? ?  ? Accurate as of December 23, 2021 11:59 PM. If you have any questions, ask your nurse or doctor.  ?  ?  ? ?  ? ?aspirin 81 MG EC tablet ?Take 81 mg by mouth daily. Swallow whole. ?What changed: Another medication with the same name was removed. Continue taking this medication, and follow the directions you see here. ?Changed by: Javarri Segal X Riddik Senna, NP ?  ?calcium-vitamin D 500-200 MG-UNIT tablet ?Commonly known as: OSCAL  WITH D ?Take 1 tablet by mouth 2 (two) times daily. ?  ?donepezil 5 MG tablet ?Commonly known as: ARICEPT ?Take 1 tablet (5 mg total) by mouth at bedtime. ?  ?escitalopram 10 MG tablet ?Commonly known as: LEXAPRO ?Take 15 mg by mouth every morning. ?  ?Lagevrio 200 MG Caps capsule ?Generic drug: molnupiravir EUA ?Take 4 capsules by mouth 2 (two) times daily. ?  ?levothyroxine 100 MCG tablet ?Commonly known as: SYNTHROID ?Take 100 mcg by mouth daily before breakfast. ?  ?melatonin 1 MG Tabs tablet ?Take 1 mg by mouth at bedtime. ?  ? ?  ?ROS was provided with assistance of staff.  ? ?Review of Systems  ?Constitutional:  Negative for activity  change, appetite change and fever.  ?HENT:  Positive for hearing loss. Negative for congestion and voice change.   ?Eyes:  Negative for visual disturbance.  ?Respiratory:  Negative for shortness of breath.   ?Gastrointestinal:  Negative for abdominal pain and constipation.  ?Genitourinary:  Negative for dysuria and urgency.  ?Musculoskeletal:  Positive for gait problem.  ?Skin:  Negative for color change.  ?     Left orbital/facial hematoma  ?Neurological:  Negative for speech difficulty, weakness and light-headedness.  ?     Memory lapses.   ?Psychiatric/Behavioral:  Positive for agitation. Negative for sleep disturbance. The patient is nervous/anxious.   ? ?Immunization History  ?Administered Date(s) Administered  ? Influenza Whole 06/15/2018  ? Influenza,inj,Quad PF,6+ Mos 06/25/2013  ? Influenza-Unspecified 06/23/2020, 06/30/2021  ? Moderna Sars-Covid-2 Vaccination 09/13/2019, 10/11/2019, 07/20/2020, 02/08/2021  ? Pneumococcal Conjugate-13 03/11/2014  ? Pneumococcal Polysaccharide-23 09/11/1986, 09/11/2001, 06/25/2013  ? Tdap 03/16/2011, 12/22/2021  ? Unspecified SARS-COV-2 Vaccination 05/31/2021  ? Zoster Recombinat (Shingrix) 09/12/2007  ? ?Pertinent  Health Maintenance Due  ?Topic Date Due  ? INFLUENZA VACCINE  04/11/2022  ? DEXA SCAN  Completed  ? ? ?  06/16/2019  ?  8:17 PM 06/17/2019  ?  8:03 AM 06/17/2019  ?  7:28 PM 06/18/2019  ?  7:46 AM 12/22/2021  ?  9:00 AM  ?Fall Risk  ?Patient Fall Risk Level High fall risk High fall risk High fall risk High fall risk High fall risk  ? ?Functional Status Survey: ?  ? ?Vitals:  ? 12/23/21 1341  ?BP: 120/70  ?Pulse: 76  ?Resp: 17  ?Temp: (!) 97.5 ?F (36.4 ?C)  ?SpO2: 95%  ?Weight: 169 lb 12.8 oz (77 kg)  ?Height: '4\' 8"'$  (1.422 m)  ? ?Body mass index is 38.07 kg/m?Marland Kitchen ?Physical Exam ?Vitals and nursing note reviewed.  ?Constitutional:   ?   Comments: stronger  ?HENT:  ?   Head: Normocephalic and atraumatic.  ?   Nose: No congestion or rhinorrhea.  ?   Mouth/Throat:  ?    Mouth: Mucous membranes are moist.  ?Eyes:  ?   Extraocular Movements: Extraocular movements intact.  ?   Conjunctiva/sclera: Conjunctivae normal.  ?   Pupils: Pupils are equal, round, and reactive to light.  ?Cardiovascular:  ?   Rate and Rhythm: Normal rate and regular rhythm.  ?   Heart sounds: No murmur heard. ?Pulmonary:  ?   Effort: Pulmonary effort is normal.  ?   Breath sounds: No rales.  ?   Comments: Cough, central congestion ?Abdominal:  ?   General: Bowel sounds are normal.  ?   Palpations: Abdomen is soft.  ?   Tenderness: There is no abdominal tenderness.  ?   Hernia: A hernia is present.  ?  Comments: Umbilical hernia.   ?Musculoskeletal:  ?   Cervical back: Normal range of motion and neck supple.  ?   Right lower leg: No edema.  ?   Left lower leg: No edema.  ?Skin: ?   General: Skin is warm and dry.  ?   Findings: Bruising present.  ?   Comments: Hematoma left orbital/facial area  ?Neurological:  ?   General: No focal deficit present.  ?   Mental Status: She is alert. Mental status is at baseline.  ?   Gait: Gait abnormal.  ?   Comments: Oriented to person, place.   ?Psychiatric:     ?   Mood and Affect: Mood normal.     ?   Behavior: Behavior normal.  ?   Comments: Upon my examination.   ? ? ?Labs reviewed: ?Recent Labs  ?  12/22/21 ?0913  ?NA 140  ?K 3.8  ?CL 109  ?CO2 23  ?GLUCOSE 111*  ?BUN 10  ?CREATININE 0.68  ?CALCIUM 8.8*  ? ?No results for input(s): AST, ALT, ALKPHOS, BILITOT, PROT, ALBUMIN in the last 8760 hours. ? ?Recent Labs  ?  12/22/21 ?0913  ?WBC 7.5  ?NEUTROABS 4.4  ?HGB 16.4*  ?HCT 48.4*  ?MCV 98.0  ?PLT 166  ? ?Lab Results  ?Component Value Date  ? TSH 0.52 09/22/2021  ? ?Lab Results  ?Component Value Date  ? HGBA1C 5.7 06/18/2018  ? ?Lab Results  ?Component Value Date  ? CHOL 188 01/30/2018  ? HDL 64 01/30/2018  ? LDLCALC 106 (H) 01/30/2018  ? TRIG 89 01/30/2018  ? CHOLHDL 2.9 01/30/2018  ? ? ?Significant Diagnostic Results in last 30 days:  ?DG Elbow Complete Left ? ?Result  Date: 12/22/2021 ?CLINICAL DATA:  Fall.  Skin tear on left elbow. EXAM: LEFT ELBOW - COMPLETE 3+ VIEW COMPARISON:  None. FINDINGS: Four view study. No evidence for an acute fracture. No subluxation or dislocatio

## 2021-12-23 NOTE — Assessment & Plan Note (Signed)
takes Levothyroxine TSH 0.52 09/22/21 ?

## 2021-12-23 NOTE — Assessment & Plan Note (Signed)
lack of safety awareness,  resides in [facility] for safety, care assistance, on Donepezil for memory. MMSE 22/30 08/22/21 

## 2021-12-23 NOTE — Assessment & Plan Note (Signed)
S/p fall, left orbital hematoma, ED evaluation 12/22/21, CT head, cervical spine, maxillofacial showed no acute process.  ?

## 2021-12-23 NOTE — Assessment & Plan Note (Signed)
uses walker, risk of falling is high.  ?

## 2021-12-23 NOTE — Assessment & Plan Note (Signed)
improving, completing  5 day course of Molnupiravir ?

## 2021-12-26 ENCOUNTER — Encounter: Payer: Self-pay | Admitting: Nurse Practitioner

## 2021-12-27 DIAGNOSIS — U071 COVID-19: Secondary | ICD-10-CM | POA: Diagnosis not present

## 2021-12-27 DIAGNOSIS — I10 Essential (primary) hypertension: Secondary | ICD-10-CM | POA: Diagnosis not present

## 2021-12-27 LAB — CBC AND DIFFERENTIAL
HCT: 49 — AB (ref 36–46)
Hemoglobin: 16.6 — AB (ref 12.0–16.0)
Neutrophils Absolute: 4183
Platelets: 208 10*3/uL (ref 150–400)
WBC: 8.4

## 2021-12-27 LAB — BASIC METABOLIC PANEL
BUN: 10 (ref 4–21)
CO2: 22 (ref 13–22)
Chloride: 102 (ref 99–108)
Creatinine: 0.8 (ref 0.5–1.1)
Glucose: 94
Potassium: 4.1 mEq/L (ref 3.5–5.1)
Sodium: 137 (ref 137–147)

## 2021-12-27 LAB — COMPREHENSIVE METABOLIC PANEL
Albumin: 3.6 (ref 3.5–5.0)
Calcium: 9.3 (ref 8.7–10.7)
Globulin: 2.7
eGFR: 72

## 2021-12-27 LAB — HEPATIC FUNCTION PANEL
ALT: 14 U/L (ref 7–35)
AST: 27 (ref 13–35)
Alkaline Phosphatase: 63 (ref 25–125)
Bilirubin, Total: 1.1

## 2021-12-27 LAB — CBC: RBC: 4.98 (ref 3.87–5.11)

## 2021-12-29 ENCOUNTER — Encounter: Payer: Self-pay | Admitting: Nurse Practitioner

## 2021-12-29 ENCOUNTER — Non-Acute Institutional Stay: Payer: Medicare (Managed Care) | Admitting: Nurse Practitioner

## 2021-12-29 DIAGNOSIS — F0390 Unspecified dementia without behavioral disturbance: Secondary | ICD-10-CM

## 2021-12-29 DIAGNOSIS — R269 Unspecified abnormalities of gait and mobility: Secondary | ICD-10-CM | POA: Diagnosis not present

## 2021-12-29 DIAGNOSIS — U071 COVID-19: Secondary | ICD-10-CM | POA: Diagnosis not present

## 2021-12-29 DIAGNOSIS — T148XXA Other injury of unspecified body region, initial encounter: Secondary | ICD-10-CM | POA: Diagnosis not present

## 2021-12-29 DIAGNOSIS — E039 Hypothyroidism, unspecified: Secondary | ICD-10-CM

## 2021-12-29 DIAGNOSIS — F339 Major depressive disorder, recurrent, unspecified: Secondary | ICD-10-CM | POA: Diagnosis not present

## 2021-12-29 NOTE — Progress Notes (Signed)
?Location:  [facility] ?Nursing Home Room Number: AL/821/A ?Place of Service:  ALF (13) ?Provider:  Paulette Rockford X, NP ? ?[name], MD ? ?Patient Care Team: ?[name], MD as PCP - General (Internal Medicine) ?Mattilynn Forrer X, NP as Nurse Practitioner (Internal Medicine) ? ?Extended Emergency Contact Information ?Primary Emergency Contact: Marucci,[name] ?Address: [address] ?         [place], [place] [address] [place] ?Home Phone: [phone] ?Mobile Phone: [phone] ?Relation: Son ?Secondary Emergency Contact: Javed,Brett ?Address: [address] ?         [place], [place] [address] [place] ?Home Phone: [phone] ?Work Phone: [phone] ?Mobile Phone: [phone] ?Relation: Son ? ?Code Status:  DNR ?Goals of care: Advanced Directive information ? ?  12/29/2021  ? 10:43 AM  ?Advanced Directives  ?Does Patient Have a Medical Advance Directive? Yes  ?Type of Advance Directive Living will;Out of facility DNR (pink MOST or yellow form)  ?Does patient want to make changes to medical advance directive? No - Patient declined  ?Copy of [place] in Chart? Yes - validated most recent copy scanned in chart (See row information)  ?Pre-existing out of facility DNR order (yellow form or pink MOST form) Pink MOST form placed in chart (order not valid for inpatient use);Yellow form placed in chart (order not valid for inpatient use)  ? ? ? ?Chief Complaint  ?Patient presents with  ? Acute Visit  ?  Patient is being seen for falls  ? ? ?HPI:  ?Pt is a 88 y.o. female seen today for an acute visit for f/u the left face/orbital hematoma, resolving slowly, no focal weakness noted.  ? S/p fall, left orbital hematoma, ED evaluation 12/22/21, CT head, cervical spine, maxillofacial showed no acute process.  ? Anxious, irritation, better since off quarantine, prn Lorazepam is effective.  ?COVID healed clinically,  completed Molnupiravir ?            Gait  abnormality, uses walker ?            Dementia, lack of safety awareness,  res[facility] for [facility], care assistance, on Donepezil for memory. MMSE 22/30 08/22/21 ?            Her mood is stabilizing on Escitalopram ?            Hypothyroidism, takes Levothyroxine TSH 0.52 09/22/21 ?  ? ?Past Medical History:  ?Diagnosis Date  ? Eczema   ? Hypothyroid   ? Hypothyroidism   ? Hypothyroidism   ? Vitamin D deficiency   ? ?Past Surgical History:  ?Procedure Laterality Date  ? CHOLECYSTECTOMY N/A 06/24/2013  ? Procedure: LAPAROSCOPIC CHOLECYSTECTOMY WITH INTRAOPERATIVE CHOLANGIOGRAM;  Surgeon: Ralene Ok, MD;  Location: [facility];  Service: General;  Laterality: N/A;  ? TONSILLECTOMY    ? ? ?No Known Allergies ? ?Outpatient Encounter Medications as of 12/29/2021  ?Medication Sig  ? aspirin 81 MG EC tablet Take 81 mg by mouth daily. Swallow whole.  ? calcium-vitamin D (OSCAL WITH D) 500-200 MG-UNIT tablet Take 1 tablet by mouth 2 (two) times daily.  ? donepezil (ARICEPT) 5 MG tablet Take 1 tablet (5 mg total) by mouth at bedtime.  ? escitalopram (LEXAPRO) 10 MG tablet Take 15 mg by mouth every morning.  ? levothyroxine (SYNTHROID, LEVOTHROID) 100 MCG tablet Take 100 mcg by mouth daily before breakfast.  ? melatonin 1 MG TABS tablet Take 1 mg by mouth at bedtime.  ?  molnupiravir EUA (LAGEVRIO) 200 MG CAPS capsule Take 4 capsules by mouth 2 (two) times daily.  ? ?No facility-administered encounter medications on file as of 12/29/2021.  ? ? ?Review of Systems  ?Constitutional:  Negative for activity change, appetite change and fever.  ?HENT:  Positive for hearing loss. Negative for congestion and voice change.   ?Eyes:  Negative for visual disturbance.  ?Respiratory:  Negative for shortness of breath.   ?Gastrointestinal:  Negative for abdominal pain and constipation.  ?Genitourinary:  Negative for dysuria and urgency.  ?Musculoskeletal:  Positive for gait problem.  ?Skin:  Negative for color change.  ?     Left  orbital/facial hematoma  ?Neurological:  Negative for speech difficulty, weakness and light-headedness.  ?     Memory lapses.   ?Psychiatric/Behavioral:  Positive for agitation. Negative for sleep disturbance. The patient is nervous/anxious.   ? ?Immunization History  ?Administered Date(s) Administered  ? Influenza Whole 06/15/2018  ? Influenza,inj,Quad PF,6+ Mos 06/25/2013  ? Influenza-Unspecified 06/23/2020, 06/30/2021  ? Moderna Sars-Covid-2 Vaccination 09/13/2019, 10/11/2019, 07/20/2020, 02/08/2021  ? Pneumococcal Conjugate-13 03/11/2014  ? Pneumococcal Polysaccharide-23 09/11/1986, 09/11/2001, 06/25/2013  ? Tdap 03/16/2011, 12/22/2021  ? Unspecified SARS-COV-2 Vaccination 05/31/2021  ? Zoster Recombinat (Shingrix) 09/12/2007  ? ?Pertinent  Health Maintenance Due  ?Topic Date Due  ? INFLUENZA VACCINE  04/11/2022  ? DEXA SCAN  Completed  ? ? ?  06/16/2019  ?  8:17 PM 06/17/2019  ?  8:03 AM 06/17/2019  ?  7:28 PM 06/18/2019  ?  7:46 AM 12/22/2021  ?  9:00 AM  ?Fall Risk  ?Patient Fall Risk Level High fall risk High fall risk High fall risk High fall risk High fall risk  ? ?Functional Status Survey: ?  ? ?Vitals:  ? 12/29/21 1039  ?BP: 134/70  ?Pulse: 64  ?Resp: 20  ?Temp: 97.8 ?F (36.6 ?C)  ?SpO2: 92%  ?Weight: 169 lb 12.8 oz (77 kg)  ?Height: '4\' 8"'$  (1.422 m)  ? ?Body mass index is 38.07 kg/m?Marland Kitchen ?Physical Exam ?Vitals and nursing note reviewed.  ?Constitutional:   ?   Comments: stronger  ?HENT:  ?   Head: Normocephalic and atraumatic.  ?   Nose: Congestion present.  ?   Mouth/Throat:  ?   Mouth: Mucous membranes are moist.  ?Eyes:  ?   Extraocular Movements: Extraocular movements intact.  ?   Conjunctiva/sclera: Conjunctivae normal.  ?   Pupils: Pupils are equal, round, and reactive to light.  ?Cardiovascular:  ?   Rate and Rhythm: Normal rate and regular rhythm.  ?   Heart sounds: No murmur heard. ?Pulmonary:  ?   Effort: Pulmonary effort is normal.  ?   Breath sounds: No rales.  ?Abdominal:  ?   General: Bowel sounds  are normal.  ?   Palpations: Abdomen is soft.  ?   Tenderness: There is no abdominal tenderness.  ?   Hernia: A hernia is present.  ?   Comments: Umbilical hernia.   ?Musculoskeletal:  ?   Cervical back: Normal range of motion and neck supple.  ?   Right lower leg: No edema.  ?   Left lower leg: No edema.  ?Skin: ?   General: Skin is warm and dry.  ?   Findings: Bruising present.  ?   Comments: Hematoma left orbital/facial area  ?Neurological:  ?   General: No focal deficit present.  ?   Mental Status: She is alert. Mental status is at baseline.  ?  Gait: Gait abnormal.  ?   Comments: Oriented to person, place.   ?Psychiatric:     ?   Mood and Affect: Mood normal.     ?   Behavior: Behavior normal.  ?   Comments: Upon my examination.   ? ? ?Labs reviewed: ?Recent Labs  ?  12/22/21 ?0913  ?NA 140  ?K 3.8  ?CL 109  ?CO2 23  ?GLUCOSE 111*  ?BUN 10  ?CREATININE 0.68  ?CALCIUM 8.8*  ? ?No results for input(s): AST, ALT, ALKPHOS, BILITOT, PROT, ALBUMIN in the last 8760 hours. ?Recent Labs  ?  12/22/21 ?0913  ?WBC 7.5  ?NEUTROABS 4.4  ?HGB 16.4*  ?HCT 48.4*  ?MCV 98.0  ?PLT 166  ? ?Lab Results  ?Component Value Date  ? TSH 0.52 09/22/2021  ? ?Lab Results  ?Component Value Date  ? HGBA1C 5.7 06/18/2018  ? ?Lab Results  ?Component Value Date  ? CHOL 188 01/30/2018  ? HDL 64 01/30/2018  ? LDLCALC 106 (H) 01/30/2018  ? TRIG 89 01/30/2018  ? CHOLHDL 2.9 01/30/2018  ? ? ?Significant Diagnostic Results in last 30 days:  ?DG Elbow Complete Left ? ?Result Date: 12/22/2021 ?CLINICAL DATA:  Fall.  Skin tear on left elbow. EXAM: LEFT ELBOW - COMPLETE 3+ VIEW COMPARISON:  None. FINDINGS: Four view study. No evidence for an acute fracture. No subluxation or dislocation. No fat pad elevation to suggest joint effusion. Posterior skin irregularity is compatible with reported laceration. IMPRESSION: No acute bony abnormality. Electronically Signed   By: Misty Stanley M.D.   On: 12/22/2021 09:57  ? ?CT Head Wo Contrast ? ?Result Date:  12/22/2021 ?CLINICAL DATA:  Fall, large hematoma to left eye EXAM: CT HEAD WITHOUT CONTRAST CT MAXILLOFACIAL WITHOUT CONTRAST CT CERVICAL SPINE WITHOUT CONTRAST TECHNIQUE: Multidetector CT imaging of the head, cervical spin

## 2021-12-30 ENCOUNTER — Encounter: Payer: Self-pay | Admitting: Nurse Practitioner

## 2021-12-30 NOTE — Assessment & Plan Note (Signed)
takes Levothyroxine TSH 0.52 09/22/21 ?

## 2021-12-30 NOTE — Assessment & Plan Note (Signed)
the left face/orbital hematoma, resolving slowly, no focal weakness noted.  ? S/p fall, left orbital hematoma, ED evaluation 12/22/21, CT head, cervical spine, maxillofacial showed no acute process.  ?

## 2021-12-30 NOTE — Assessment & Plan Note (Signed)
lack of safety awareness,  resides in [facility] for safety, care assistance, on Donepezil for memory. MMSE 22/30 08/22/21 

## 2021-12-30 NOTE — Assessment & Plan Note (Signed)
Ambulates with walker, risk of falling is high ?

## 2021-12-30 NOTE — Assessment & Plan Note (Signed)
healed clinically,  completed Molnupiravir ?

## 2021-12-30 NOTE — Assessment & Plan Note (Signed)
Anxious, irritation better since off quarantine, prn Lorazepam is effective, continue Escitalopram.  ?

## 2022-01-03 DIAGNOSIS — I10 Essential (primary) hypertension: Secondary | ICD-10-CM | POA: Diagnosis not present

## 2022-01-28 ENCOUNTER — Emergency Department (HOSPITAL_BASED_OUTPATIENT_CLINIC_OR_DEPARTMENT_OTHER): Payer: Medicare (Managed Care)

## 2022-01-28 ENCOUNTER — Encounter (HOSPITAL_BASED_OUTPATIENT_CLINIC_OR_DEPARTMENT_OTHER): Payer: Self-pay

## 2022-01-28 ENCOUNTER — Emergency Department (HOSPITAL_BASED_OUTPATIENT_CLINIC_OR_DEPARTMENT_OTHER)
Admission: EM | Admit: 2022-01-28 | Discharge: 2022-01-28 | Disposition: A | Discharge status: Home / Self Care | Payer: Medicare (Managed Care) | Attending: Emergency Medicine | Admitting: Emergency Medicine

## 2022-01-28 ENCOUNTER — Other Ambulatory Visit: Payer: Self-pay

## 2022-01-28 DIAGNOSIS — Z79899 Other long term (current) drug therapy: Secondary | ICD-10-CM | POA: Diagnosis not present

## 2022-01-28 DIAGNOSIS — Z7982 Long term (current) use of aspirin: Secondary | ICD-10-CM | POA: Diagnosis not present

## 2022-01-28 DIAGNOSIS — S0101XA Laceration without foreign body of scalp, initial encounter: Secondary | ICD-10-CM | POA: Insufficient documentation

## 2022-01-28 DIAGNOSIS — S0990XA Unspecified injury of head, initial encounter: Secondary | ICD-10-CM | POA: Diagnosis present

## 2022-01-28 DIAGNOSIS — E039 Hypothyroidism, unspecified: Secondary | ICD-10-CM | POA: Diagnosis not present

## 2022-01-28 DIAGNOSIS — W19XXXA Unspecified fall, initial encounter: Secondary | ICD-10-CM | POA: Diagnosis not present

## 2022-01-28 NOTE — ED Triage Notes (Signed)
Patient BIB [facility] from [facility]  Unwitnessed Fall and was found by [employer] in Room on [employer]. Bleeding to Anterior Head noted by Staff and controlled by Same.  No Complaints at this Time from Patient. Confused at Baseline. No Recollection of Fall.  NAD Noted during Triage. BIB Stretcher. No Anticoagulants.

## 2022-01-28 NOTE — ED Notes (Signed)
Report given to the staff at the facility and [facility].

## 2022-01-28 NOTE — ED Notes (Signed)
Pt laceration on head undressed and irrigated with saline

## 2022-01-28 NOTE — Discharge Instructions (Signed)
Placed 3 staples into the scalp area of the laceration.  They can be removed in 7 to 10 days.  Apply antibiotic ointment daily to the wound.  CT head without any acute findings.

## 2022-01-28 NOTE — ED Provider Notes (Addendum)
[facility] DEPT Provider Note   CSN: [id number] Arrival date & time: 01/28/22  1523     History  Chief Complaint  Patient presents with   Lytle Michaels    Lisa Barrera is a 88 y.o. female.  Patient brought in from the friend's home.  Unwitnessed fall.  Patient does have baseline confusion.  Patient does have DNR paperwork with her.  They did note that she has a laceration to the top of her head.  Patient is not on blood thinners.  Past medical history significant for hypothyroidism patient's had her gallbladder removed.      Home Medications Prior to Admission medications   Medication Sig Start Date End Date Taking? Authorizing Provider  aspirin 81 MG EC tablet Take 81 mg by mouth daily. Swallow whole.    [provider]  calcium-vitamin D (OSCAL WITH D) 500-200 MG-UNIT tablet Take 1 tablet by mouth 2 (two) times daily.    [provider]  donepezil (ARICEPT) 5 MG tablet Take 1 tablet (5 mg total) by mouth at bedtime. 06/26/17   Blanchie Serve, MD  escitalopram (LEXAPRO) 10 MG tablet Take 15 mg by mouth every morning.    [provider]  levothyroxine (SYNTHROID, LEVOTHROID) 100 MCG tablet Take 100 mcg by mouth daily before breakfast.    [provider]  melatonin 1 MG TABS tablet Take 1 mg by mouth at bedtime.    [provider]  molnupiravir EUA (LAGEVRIO) 200 MG CAPS capsule Take 4 capsules by mouth 2 (two) times daily.    [provider]      Allergies    Patient has no known allergies.    Review of Systems   Review of Systems  Unable to perform ROS: Dementia   Physical Exam Updated Vital Signs BP (!) 115/102   Pulse 69   Temp 98.5 F (36.9 C) (Oral)   Resp (!) 21   Ht 1.422 m ('4\' 8"'$ )   Wt 77 kg   SpO2 92%   BMI 38.06 kg/m  Physical Exam Vitals and nursing note reviewed.  Constitutional:      General: She is not in acute distress.    Appearance: Normal appearance. She is  well-developed.  HENT:     Head: Normocephalic.     Comments: 3 cm laceration to the top part of the scalp.  No active bleeding.  Not significantly deep. Eyes:     Extraocular Movements: Extraocular movements intact.     Conjunctiva/sclera: Conjunctivae normal.     Pupils: Pupils are equal, round, and reactive to light.  Cardiovascular:     Rate and Rhythm: Normal rate and regular rhythm.     Heart sounds: No murmur heard. Pulmonary:     Effort: Pulmonary effort is normal. No respiratory distress.     Breath sounds: Normal breath sounds.  Abdominal:     Palpations: Abdomen is soft.     Tenderness: There is no abdominal tenderness.  Musculoskeletal:        General: No swelling, deformity or signs of injury.     Cervical back: Normal range of motion and neck supple. No rigidity or tenderness.     Comments: Patient with good range of motion of both lower extremities and upper extremities without evidence of any injury or any pain.  No tenderness to palpation to the thoracic spine or lumbar spine.  Skin:    General: Skin is warm and dry.     Capillary Refill: Capillary refill takes  less than 2 seconds.  Neurological:     General: No focal deficit present.     Mental Status: She is alert. Mental status is at baseline.     Cranial Nerves: No cranial nerve deficit.     Sensory: No sensory deficit.     Motor: No weakness.  Psychiatric:        Mood and Affect: Mood normal.    ED Results / Procedures / Treatments   Labs (all labs ordered are listed, but only abnormal results are displayed) Labs Reviewed - No data to display  EKG EKG Interpretation  Date/Time:  Saturday [name] 28 2022 15:23:57 EDT Ventricular Rate:  61 PR Interval:  183 QRS Duration: 142 QT Interval:  441 QTC Calculation: 445 R Axis:   22 Text Interpretation: Sinus rhythm Right bundle branch block No significant change since last tracing Confirmed by Fredia Sorrow [phone]) on 01/28/2022 3:28:13 PM  Radiology CT  Head Wo Contrast  Result Date: 01/28/2022 CLINICAL DATA:  88 year old female with fall and head injury. Initial encounter. EXAM: CT HEAD WITHOUT CONTRAST TECHNIQUE: Contiguous axial images were obtained from the base of the skull through the vertex without intravenous contrast. RADIATION DOSE REDUCTION: This exam was performed according to the departmental dose-optimization program which includes automated exposure control, adjustment of the mA and/or kV according to patient size and/or use of iterative reconstruction technique. COMPARISON:  12/22/2021 CT FINDINGS: Brain: No evidence of acute infarction, hemorrhage, hydrocephalus, extra-axial collection or mass lesion/mass effect. Atrophy and chronic small-vessel white matter ischemic changes again noted. Vascular: Carotid atherosclerotic calcifications are noted. Skull: Normal. Negative for fracture or focal lesion. Sinuses/Orbits: No acute finding. Other: Anterior scalp soft tissue swelling noted. IMPRESSION: 1. No evidence of acute intracranial abnormality. 2. Anterior scalp soft tissue swelling without fracture. 3. Atrophy and chronic small-vessel white matter ischemic changes. Electronically Signed   By: Margarette Canada M.D.   On: 01/28/2022 18:12    Procedures .Marland KitchenLaceration Repair  Date/Time: 01/28/2022 7:47 PM Performed by: Fredia Sorrow, MD Authorized by: Fredia Sorrow, MD   Consent:    Consent obtained:  Verbal   Consent given by:  Guardian   Risks, benefits, and alternatives were discussed: yes     Risks discussed:  Infection and pain   Alternatives discussed:  No treatment Universal protocol:    Procedure explained and questions answered to patient or proxy's satisfaction: yes     Relevant documents present and verified: yes     Test results available: yes     Imaging studies available: yes     Immediately prior to procedure, a time out was called: yes     Patient identity confirmed:  Arm band Laceration details:    Location:   Scalp   Length (cm):  3   Depth (mm):  2 Exploration:    Limited defect created (wound extended): no     Contaminated: no   Treatment:    Area cleansed with:  Saline   Amount of cleaning:  Standard   Visualized foreign bodies/material removed: no   Skin repair:    Repair method:  Staples Approximation:    Approximation:  Loose Repair type:    Repair type:  Simple Post-procedure details:    Dressing:  Antibiotic ointment   Procedure completion:  Tolerated well, no immediate complications    Medications Ordered in ED Medications - No data to display  ED Course/ Medical Decision Making/ A&P  Medical Decision Making Amount and/or Complexity of Data Reviewed Radiology: ordered.   CT head without any acute findings.  We will go ahead and put staples in the scalp laceration and patient be good for discharge.  Placed 3 staples.  They can be removed in 7 to 10 days.  Patient stable for return back to the facility.  Final Clinical Impression(s) / ED Diagnoses Final diagnoses:  Fall, initial encounter  Injury of head, initial encounter  Laceration of scalp, initial encounter    Rx / DC Orders ED Discharge Orders     None         Fredia Sorrow, MD 01/28/22 Lanetta Inch    Fredia Sorrow, MD 01/28/22 [phone]

## 2022-01-30 ENCOUNTER — Encounter: Payer: Self-pay | Admitting: Adult Health

## 2022-01-30 ENCOUNTER — Non-Acute Institutional Stay: Payer: Medicare (Managed Care) | Admitting: Adult Health

## 2022-01-30 DIAGNOSIS — W19XXXS Unspecified fall, sequela: Secondary | ICD-10-CM | POA: Diagnosis not present

## 2022-01-30 DIAGNOSIS — F339 Major depressive disorder, recurrent, unspecified: Secondary | ICD-10-CM

## 2022-01-30 DIAGNOSIS — F0390 Unspecified dementia without behavioral disturbance: Secondary | ICD-10-CM

## 2022-01-30 DIAGNOSIS — E039 Hypothyroidism, unspecified: Secondary | ICD-10-CM

## 2022-01-30 NOTE — Progress Notes (Signed)
Location:  [facility] Room Number: 821/A Place of Service:  ALF [phone]) Provider:  Durenda Age, DNP, FNP-BC  Patient Care Team: [name], MD as PCP - General (Internal Medicine) Mast, Man X, NP as Nurse Practitioner (Internal Medicine)  Extended Emergency Contact Information Primary Emergency Contact: Lira,[name] Address: [address]          [place], [place] [address] [name] of [place] Phone: [phone] Mobile Phone: [phone] Relation: Son Secondary Emergency Contact: Thurmond,Brett Address: [address]          [place], [place] [address] [name] of [place] Phone: [phone] Work Phone: [phone] Mobile Phone: [phone] Relation: Son  Code Status:  DNR  Goals of care: Advanced Directive information    01/30/2022    3:34 PM  Advanced Directives  Does Patient Have a Medical Advance Directive? Yes  Type of Paramedic of [place];Out of facility DNR (pink MOST or yellow form)  Does patient want to make changes to medical advance directive? No - Patient declined  Copy of [place] in Chart? Yes - validated most recent copy scanned in chart (See row information)  Pre-existing out of facility DNR order (yellow form or pink MOST form) Pink MOST form placed in chart (order not valid for inpatient use);Yellow form placed in chart (order not valid for inpatient use)     Chief Complaint  Patient presents with   Acute Visit    Follow up ED visit    HPI:  Pt is a 88 y.o. female seen today for a follow up ED visit S/P fall. She had an unwitnessed fall sustaining a laceration to the top of her head.  CT head without any acute findings.  Placed 3 staples in the scalp laceration and was discharged back to [facility] at [facility].   She was seen in the room today.    Past Medical History:  Diagnosis Date   Eczema    Hypothyroid    Hypothyroidism     Hypothyroidism    Vitamin D deficiency    Past Surgical History:  Procedure Laterality Date   CHOLECYSTECTOMY N/A 06/24/2013   Procedure: LAPAROSCOPIC CHOLECYSTECTOMY WITH INTRAOPERATIVE CHOLANGIOGRAM;  Surgeon: Ralene Ok, MD;  Location: [facility];  Service: General;  Laterality: N/A;   TONSILLECTOMY      No Known Allergies  Outpatient Encounter Medications as of 01/30/2022  Medication Sig   aspirin EC 81 MG tablet Take 81 mg by mouth daily. Swallow whole.   calcium-vitamin D (OSCAL WITH D) 500-200 MG-UNIT tablet Take 1 tablet by mouth 2 (two) times daily.   donepezil (ARICEPT) 5 MG tablet Take 1 tablet (5 mg total) by mouth at bedtime.   escitalopram (LEXAPRO) 10 MG tablet Take 15 mg by mouth every morning.   levothyroxine (SYNTHROID, LEVOTHROID) 100 MCG tablet Take 100 mcg by mouth daily before breakfast.   melatonin 1 MG TABS tablet Take 1 mg by mouth at bedtime.   [DISCONTINUED] molnupiravir EUA (LAGEVRIO) 200 MG CAPS capsule Take 4 capsules by mouth 2 (two) times daily.   No facility-administered encounter medications on file as of 01/30/2022.    Review of Systems  Constitutional:  Negative for appetite change, chills, fatigue and fever.  HENT:  Negative for congestion, hearing loss, rhinorrhea and sore throat.   Eyes: Negative.   Respiratory:  Negative for cough, shortness of breath and wheezing.   Cardiovascular:  Negative for chest pain, palpitations and  leg swelling.  Gastrointestinal:  Negative for abdominal pain, constipation, diarrhea, nausea and vomiting.  Genitourinary:  Negative for dysuria.  Musculoskeletal:  Negative for arthralgias, back pain and myalgias.  Skin:  Negative for color change, rash and wound.  Neurological:  Negative for dizziness, weakness and headaches.  Psychiatric/Behavioral:  Negative for behavioral problems. The patient is not nervous/anxious.       Immunization History  Administered Date(s) Administered   Influenza Whole 06/15/2018    Influenza,inj,Quad PF,6+ Mos 06/25/2013   Influenza-Unspecified 06/23/2020, 06/30/2021   Moderna Sars-Covid-2 Vaccination 09/13/2019, 10/11/2019, 07/20/2020, 02/08/2021   Pneumococcal Conjugate-13 03/11/2014   Pneumococcal Polysaccharide-23 09/11/1986, 09/11/2001, 06/25/2013   Tdap 03/16/2011, 12/22/2021   Unspecified SARS-COV-2 Vaccination 05/31/2021   Zoster Recombinat (Shingrix) 09/12/2007   Pertinent  Health Maintenance Due  Topic Date Due   INFLUENZA VACCINE  04/11/2022   DEXA SCAN  Completed      06/17/2019    8:03 AM 06/17/2019    7:28 PM 06/18/2019    7:46 AM 12/22/2021    9:00 AM 01/28/2022    3:26 PM  Fall Risk  Patient Fall Risk Level High fall risk High fall risk High fall risk High fall risk High fall risk     Vitals:   01/30/22 1529  BP: 122/70  Pulse: 69  Resp: 20  Temp: (!) 97.3 F (36.3 C)  SpO2: 93%  Weight: 169 lb (76.7 kg)  Height: '4\' 10"'$  (1.473 m)   Body mass index is 35.32 kg/m.  Physical Exam Constitutional:      General: She is not in acute distress.    Appearance: Normal appearance.  HENT:     Head: Normocephalic and atraumatic.     Nose: Nose normal.     Mouth/Throat:     Mouth: Mucous membranes are moist.  Eyes:     Conjunctiva/sclera: Conjunctivae normal.  Cardiovascular:     Rate and Rhythm: Normal rate and regular rhythm.  Pulmonary:     Effort: Pulmonary effort is normal.     Breath sounds: Normal breath sounds.  Abdominal:     General: Bowel sounds are normal.     Palpations: Abdomen is soft.  Musculoskeletal:        General: Normal range of motion.     Cervical back: Normal range of motion.  Skin:    General: Skin is warm and dry.     Comments: Laceration on frontal scalp with 3 staples.  Neurological:     Mental Status: Mental status is at baseline. She is disoriented.     Comments: Alert to self and place, disoriented to time.  Psychiatric:        Mood and Affect: Mood normal.        Behavior: Behavior normal.        Labs reviewed: Recent Labs    12/22/21 0913  NA 140  K 3.8  CL 109  CO2 23  GLUCOSE 111*  BUN 10  CREATININE 0.68  CALCIUM 8.8*   No results for input(s): AST, ALT, ALKPHOS, BILITOT, PROT, ALBUMIN in the last 8760 hours. Recent Labs    12/22/21 0913  WBC 7.5  NEUTROABS 4.4  HGB 16.4*  HCT 48.4*  MCV 98.0  PLT 166   Lab Results  Component Value Date   TSH 0.52 09/22/2021   Lab Results  Component Value Date   HGBA1C 5.7 06/18/2018   Lab Results  Component Value Date   CHOL 188 01/30/2018   HDL 64 01/30/2018  LDLCALC 106 (H) 01/30/2018   TRIG 89 01/30/2018   CHOLHDL 2.9 01/30/2018    Significant Diagnostic Results in last 30 days:  CT Head Wo Contrast  Result Date: 01/28/2022 CLINICAL DATA:  88 year old female with fall and head injury. Initial encounter. EXAM: CT HEAD WITHOUT CONTRAST TECHNIQUE: Contiguous axial images were obtained from the base of the skull through the vertex without intravenous contrast. RADIATION DOSE REDUCTION: This exam was performed according to the departmental dose-optimization program which includes automated exposure control, adjustment of the mA and/or kV according to patient size and/or use of iterative reconstruction technique. COMPARISON:  12/22/2021 CT FINDINGS: Brain: No evidence of acute infarction, hemorrhage, hydrocephalus, extra-axial collection or mass lesion/mass effect. Atrophy and chronic small-vessel white matter ischemic changes again noted. Vascular: Carotid atherosclerotic calcifications are noted. Skull: Normal. Negative for fracture or focal lesion. Sinuses/Orbits: No acute finding. Other: Anterior scalp soft tissue swelling noted. IMPRESSION: 1. No evidence of acute intracranial abnormality. 2. Anterior scalp soft tissue swelling without fracture. 3. Atrophy and chronic small-vessel white matter ischemic changes. Electronically Signed   By: Margarette Canada M.D.   On: 01/28/2022 18:12    Assessment/Plan  1. Fall,  sequela -   CT head without any acute findings -  sustained a 3 cm laceration and was placed with 3 staples in ED  2. Acquired hypothyroidism -   Lab Results  Component Value Date   TSH 0.52 09/22/2021   -  continue levothyroxine  3. Depression, recurrent (HCC) -   Mood is stable, continue Lexapro  4. Dementia without behavioral disturbance ([place]) -    Continue Aricept and supportive care   Family/ staff Communication: Discussed plan of care with resident and charge nurse.  Labs/tests ordered:   None    Durenda Age, DNP, MSN, FNP-BC [facility] [phone] (Monday-Friday 8:00 a.m. - 5:00 p.m.) [phone] (after hours)

## 2022-02-06 DIAGNOSIS — F330 Major depressive disorder, recurrent, mild: Secondary | ICD-10-CM | POA: Diagnosis not present

## 2022-02-06 DIAGNOSIS — F411 Generalized anxiety disorder: Secondary | ICD-10-CM | POA: Diagnosis not present

## 2022-02-06 DIAGNOSIS — F02A0 Dementia in other diseases classified elsewhere, mild, without behavioral disturbance, psychotic disturbance, mood disturbance, and anxiety: Secondary | ICD-10-CM | POA: Diagnosis not present

## 2022-02-15 ENCOUNTER — Non-Acute Institutional Stay: Payer: Medicare (Managed Care) | Admitting: Adult Health

## 2022-02-15 ENCOUNTER — Encounter: Payer: Self-pay | Admitting: Adult Health

## 2022-02-15 DIAGNOSIS — E039 Hypothyroidism, unspecified: Secondary | ICD-10-CM | POA: Diagnosis not present

## 2022-02-15 DIAGNOSIS — F339 Major depressive disorder, recurrent, unspecified: Secondary | ICD-10-CM

## 2022-02-15 DIAGNOSIS — R296 Repeated falls: Secondary | ICD-10-CM

## 2022-02-15 DIAGNOSIS — F0390 Unspecified dementia without behavioral disturbance: Secondary | ICD-10-CM

## 2022-02-15 NOTE — Progress Notes (Signed)
=  Location:  [facility] Room Number: MV784/O Place of Service:  ALF [phone]) Provider:  Durenda Age, DNP, FNP-BC  Patient Care Team: [name], MD as PCP - General (Internal Medicine) Mast, Man X, NP as Nurse Practitioner (Internal Medicine)  Extended Emergency Contact Information Primary Emergency Contact: Chappelle,[name] Address: [address]          [place], [place] [address] [name] of [place] Phone: [phone] Mobile Phone: [phone] Relation: Son Secondary Emergency Contact: Sardina,Brett Address: [address]          [place], [place] [address] [name] of [place] Phone: [phone] Work Phone: [phone] Mobile Phone: [phone] Relation: Son  Code Status:  DNR  Goals of care: Advanced Directive information    02/15/2022   11:36 AM  Advanced Directives  Does Patient Have a Medical Advance Directive? Yes  Type of Paramedic of [place];Out of facility DNR (pink MOST or yellow form)  Does patient want to make changes to medical advance directive? No - Patient declined  Copy of [place] in Chart? Yes - validated most recent copy scanned in chart (See row information)  Pre-existing out of facility DNR order (yellow form or pink MOST form) Pink MOST form placed in chart (order not valid for inpatient use);Yellow form placed in chart (order not valid for inpatient use)     Chief Complaint  Patient presents with   Acute Visit    Patient is being seen for multiple falls    HPI:  Pt is a 88 y.o. female seen today for an acute visit regarding multiple falls. She was found on the floor in her room by the doorway with no apparent injury on 02/14/22. Of note, she was sent to ED on 01/28/22 sustaining laceration on her scalp and 3 staples were placed. She is a resident of [facility].  She was seen in her room today. She denies pain and pleasantly  confused. She has BIMS score 5/15, ranging in severe cognitive impairment. She takes Donepezil 5 mg at bedtime for dementia. Latest PHQ-9 score is 0, no depressed mood. She takes Lexapro 15 mg daily for depression. Latest tsh, taken 09/22/21, 0.52. She takes Levothyroxine 100 mcg daily for hypothyroidism.   Past Medical History:  Diagnosis Date   Eczema    Hypothyroid    Hypothyroidism    Hypothyroidism    Vitamin D deficiency    Past Surgical History:  Procedure Laterality Date   CHOLECYSTECTOMY N/A 06/24/2013   Procedure: LAPAROSCOPIC CHOLECYSTECTOMY WITH INTRAOPERATIVE CHOLANGIOGRAM;  Surgeon: Ralene Ok, MD;  Location: [facility];  Service: General;  Laterality: N/A;   TONSILLECTOMY      No Known Allergies  Outpatient Encounter Medications as of 02/15/2022  Medication Sig   aspirin EC 81 MG tablet Take 81 mg by mouth daily. Swallow whole.   calcium-vitamin D (OSCAL WITH D) 500-200 MG-UNIT tablet Take 1 tablet by mouth 2 (two) times daily.   donepezil (ARICEPT) 5 MG tablet Take 1 tablet (5 mg total) by mouth at bedtime.   escitalopram (LEXAPRO) 10 MG tablet Take 15 mg by mouth every morning.   levothyroxine (SYNTHROID, LEVOTHROID) 100 MCG tablet Take 100 mcg by mouth daily before breakfast.   melatonin 1 MG TABS tablet Take 1 mg by mouth at bedtime.   No facility-administered encounter medications on file as of 02/15/2022.    Review of Systems  Constitutional:  Negative for appetite change,  chills, fatigue and fever.  HENT:  Negative for congestion, hearing loss, rhinorrhea and sore throat.   Eyes: Negative.   Respiratory:  Negative for cough, shortness of breath and wheezing.   Cardiovascular:  Negative for chest pain, palpitations and leg swelling.  Gastrointestinal:  Negative for abdominal pain, constipation, diarrhea, nausea and vomiting.  Genitourinary:  Negative for dysuria.  Musculoskeletal:  Negative for arthralgias, back pain and myalgias.  Skin:  Negative for color  change, rash and wound.  Neurological:  Negative for dizziness, weakness and headaches.  Psychiatric/Behavioral:  Negative for behavioral problems. The patient is not nervous/anxious.        Immunization History  Administered Date(s) Administered   Influenza Whole 06/15/2018   Influenza,inj,Quad PF,6+ Mos 06/25/2013   Influenza-Unspecified 06/23/2020, 06/30/2021   Moderna Sars-Covid-2 Vaccination 09/13/2019, 10/11/2019, 07/20/2020, 02/08/2021   Pneumococcal Conjugate-13 03/11/2014   Pneumococcal Polysaccharide-23 09/11/1986, 09/11/2001, 06/25/2013   Tdap 03/16/2011, 12/22/2021   Unspecified SARS-COV-2 Vaccination 05/31/2021   Zoster Recombinat (Shingrix) 09/12/2007   Pertinent  Health Maintenance Due  Topic Date Due   INFLUENZA VACCINE  04/11/2022   DEXA SCAN  Completed      06/17/2019    7:28 PM 06/18/2019    7:46 AM 12/22/2021    9:00 AM 01/28/2022    3:26 PM 02/15/2022   11:35 AM  Fall Risk  Falls in the past year?     1  Was there an injury with Fall?     1  Fall Risk Category Calculator     3  Fall Risk Category     High  Patient Fall Risk Level High fall risk High fall risk High fall risk High fall risk High fall risk  Patient at Risk for Falls Due to     History of fall(s)  Fall risk Follow up     Falls evaluation completed     Vitals:   02/15/22 1137  BP: 100/60  Pulse: 68  Resp: 18  Temp: (!) 97.3 F (36.3 C)  SpO2: 97%  Weight: 165 lb 9.6 oz (75.1 kg)  Height: '4\' 10"'$  (1.473 m)   Body mass index is 34.61 kg/m.  Physical Exam Constitutional:      General: She is not in acute distress. HENT:     Head: Normocephalic and atraumatic.     Nose: Nose normal.     Mouth/Throat:     Mouth: Mucous membranes are moist.  Eyes:     Conjunctiva/sclera: Conjunctivae normal.  Cardiovascular:     Rate and Rhythm: Normal rate and regular rhythm.  Pulmonary:     Effort: Pulmonary effort is normal.     Breath sounds: Normal breath sounds.  Abdominal:     General:  Bowel sounds are normal.     Palpations: Abdomen is soft.  Musculoskeletal:        General: Normal range of motion.     Cervical back: Normal range of motion.  Skin:    General: Skin is warm and dry.  Neurological:     Mental Status: She is alert. Mental status is at baseline. She is disoriented.     Comments: Alert to self, disoriented to time and place.  Psychiatric:        Mood and Affect: Mood normal.        Behavior: Behavior normal.        Labs reviewed: Recent Labs    12/22/21 0913  NA 140  K 3.8  CL 109  CO2 23  GLUCOSE  111*  BUN 10  CREATININE 0.68  CALCIUM 8.8*   No results for input(s): AST, ALT, ALKPHOS, BILITOT, PROT, ALBUMIN in the last 8760 hours. Recent Labs    12/22/21 0913  WBC 7.5  NEUTROABS 4.4  HGB 16.4*  HCT 48.4*  MCV 98.0  PLT 166   Lab Results  Component Value Date   TSH 0.52 09/22/2021   Lab Results  Component Value Date   HGBA1C 5.7 06/18/2018   Lab Results  Component Value Date   CHOL 188 01/30/2018   HDL 64 01/30/2018   LDLCALC 106 (H) 01/30/2018   TRIG 89 01/30/2018   CHOLHDL 2.9 01/30/2018    Significant Diagnostic Results in last 30 days:  CT Head Wo Contrast  Result Date: 01/28/2022 CLINICAL DATA:  88 year old female with fall and head injury. Initial encounter. EXAM: CT HEAD WITHOUT CONTRAST TECHNIQUE: Contiguous axial images were obtained from the base of the skull through the vertex without intravenous contrast. RADIATION DOSE REDUCTION: This exam was performed according to the departmental dose-optimization program which includes automated exposure control, adjustment of the mA and/or kV according to patient size and/or use of iterative reconstruction technique. COMPARISON:  12/22/2021 CT FINDINGS: Brain: No evidence of acute infarction, hemorrhage, hydrocephalus, extra-axial collection or mass lesion/mass effect. Atrophy and chronic small-vessel white matter ischemic changes again noted. Vascular: Carotid  atherosclerotic calcifications are noted. Skull: Normal. Negative for fracture or focal lesion. Sinuses/Orbits: No acute finding. Other: Anterior scalp soft tissue swelling noted. IMPRESSION: 1. No evidence of acute intracranial abnormality. 2. Anterior scalp soft tissue swelling without fracture. 3. Atrophy and chronic small-vessel white matter ischemic changes. Electronically Signed   By: Margarette Canada M.D.   On: 01/28/2022 18:12    Assessment/Plan  1. Multiple falls -   staff to monitor resident frequently and assist as needed -   instructed resident to ask for assistance when needed -  will check urinalysis, CBC with differentials, CMP and tsh  2. Acquired hypothyroidism Lab Results  Component Value Date   TSH 0.52 09/22/2021   -  continue Levothyroxine -  re-check tsh  3. Depression, recurrent ([place]) -  mood is stable, continue Lexapro  4. Dementia without behavioral disturbance (HCC) -  has severe cognitive impairment -  continue Donepezil    Family/ staff Communication: Discussed plan of care with resident and charge nurse  Labs/tests ordered:  CBC with differentials, CMP, tsh and urinalysis with culture and sensitivity    Durenda Age, DNP, MSN, FNP-BC [facility] Adult [facility] [phone] (Monday-Friday 8:00 a.m. - 5:00 p.m.) [phone] (after hours)

## 2022-02-16 DIAGNOSIS — I10 Essential (primary) hypertension: Secondary | ICD-10-CM | POA: Diagnosis not present

## 2022-02-16 DIAGNOSIS — E039 Hypothyroidism, unspecified: Secondary | ICD-10-CM | POA: Diagnosis not present

## 2022-02-16 LAB — CBC AND DIFFERENTIAL
HCT: 46 (ref 36–46)
Hemoglobin: 15.6 (ref 12.0–16.0)
Hemoglobin: 15.6 (ref 12.0–16.0)
Neutrophils Absolute: 3934
Platelets: 187 10*3/uL (ref 150–400)
WBC: 7.9

## 2022-02-16 LAB — BASIC METABOLIC PANEL
BUN: 12 (ref 4–21)
CO2: 29 — AB (ref 13–22)
Chloride: 104 (ref 99–108)
Creatinine: 0.7 (ref 0.5–1.1)
Glucose: 87
Glucose: 87
Potassium: 4.2 mEq/L (ref 3.5–5.1)
Sodium: 139 (ref 137–147)

## 2022-02-16 LAB — COMPREHENSIVE METABOLIC PANEL
Albumin: 3.5 (ref 3.5–5.0)
Albumin: 3.5 (ref 3.5–5.0)
Calcium: 9.1 (ref 8.7–10.7)
Globulin: 2.7
eGFR: 82

## 2022-02-16 LAB — HEPATIC FUNCTION PANEL
ALT: 11 U/L (ref 7–35)
AST: 14 (ref 13–35)
Alkaline Phosphatase: 68 (ref 25–125)
Bilirubin, Total: 0.9

## 2022-02-16 LAB — CBC: RBC: 4.65 (ref 3.87–5.11)

## 2022-02-16 LAB — TSH
TSH: 0.24 — AB (ref 0.41–5.90)
TSH: 0.24 — AB (ref 0.41–5.90)

## 2022-02-20 ENCOUNTER — Encounter: Payer: Self-pay | Admitting: Adult Health

## 2022-02-20 ENCOUNTER — Non-Acute Institutional Stay: Payer: Medicare (Managed Care) | Admitting: Adult Health

## 2022-02-20 DIAGNOSIS — F0390 Unspecified dementia without behavioral disturbance: Secondary | ICD-10-CM | POA: Diagnosis not present

## 2022-02-20 DIAGNOSIS — E039 Hypothyroidism, unspecified: Secondary | ICD-10-CM

## 2022-02-20 NOTE — Progress Notes (Signed)
Location:  [facility] Room Number: 947-M Place of Service:  ALF [phone]) Provider:  Durenda Age, DNP, FNP-BC  Patient Care Team: [name], MD as PCP - General (Internal Medicine) Mast, Man X, NP as Nurse Practitioner (Internal Medicine)  Extended Emergency Contact Information Primary Emergency Contact: Dyches,[name] Address: [address]          [place], [place] [address] [name] of [place] Phone: [phone] Mobile Phone: [phone] Relation: Son Secondary Emergency Contact: Pape,Brett Address: [address]          [place], [place] [address] [name] of [place] Phone: [phone] Work Phone: [phone] Mobile Phone: [phone] Relation: Son  Code Status:  DNR  Goals of care: Advanced Directive information    02/20/2022   10:11 AM  Advanced Directives  Does Patient Have a Medical Advance Directive? Yes  Type of Paramedic of [place];Living will;Out of facility DNR (pink MOST or yellow form)  Does patient want to make changes to medical advance directive? No - Patient declined  Copy of [place] in Chart? Yes - validated most recent copy scanned in chart (See row information)  Pre-existing out of facility DNR order (yellow form or pink MOST form) Pink MOST/Yellow Form most recent copy in chart - Physician notified to receive inpatient order     Chief Complaint  Patient presents with   Acute Visit    Low TSH    HPI:  Pt is a 88 y.o. female seen today for an acute visit regarding  low tsh 0.24, down from 0.52. She takes Levothyroxine 100 mcg daily for hypothyroidism. She denies having palpitations. She takes Donepezil 5 mg at bedtime for dementia. Latest BIMS score 5/15, ranging in severe cognitive impairment.   Past Medical History:  Diagnosis Date   Eczema    Hypothyroid    Hypothyroidism    Hypothyroidism    Vitamin D deficiency    Past  Surgical History:  Procedure Laterality Date   CHOLECYSTECTOMY N/A 06/24/2013   Procedure: LAPAROSCOPIC CHOLECYSTECTOMY WITH INTRAOPERATIVE CHOLANGIOGRAM;  Surgeon: Ralene Ok, MD;  Location: [facility];  Service: General;  Laterality: N/A;   TONSILLECTOMY      No Known Allergies  Outpatient Encounter Medications as of 02/20/2022  Medication Sig   aspirin EC 81 MG tablet Take 81 mg by mouth daily. Swallow whole.   calcium-vitamin D (OSCAL WITH D) 500-200 MG-UNIT tablet Take 1 tablet by mouth 2 (two) times daily.   donepezil (ARICEPT) 5 MG tablet Take 1 tablet (5 mg total) by mouth at bedtime.   escitalopram (LEXAPRO) 10 MG tablet Take 15 mg by mouth every morning.   levothyroxine (SYNTHROID) 88 MCG tablet Take 88 mcg by mouth daily.   LORazepam (ATIVAN) 0.5 MG tablet Take 0.5 mg by mouth every 8 (eight) hours as needed for anxiety. agitation   melatonin 1 MG TABS tablet Take 1 mg by mouth at bedtime.   [DISCONTINUED] levothyroxine (SYNTHROID, LEVOTHROID) 100 MCG tablet Take 100 mcg by mouth daily before breakfast.   No facility-administered encounter medications on file as of 02/20/2022.    Review of Systems  Constitutional:  Negative for appetite change, chills, fatigue and fever.  HENT:  Negative for congestion, hearing loss, rhinorrhea and sore throat.   Eyes: Negative.   Respiratory:  Negative for cough, shortness of breath and wheezing.   Cardiovascular:  Negative for chest pain, palpitations and leg swelling.  Gastrointestinal:  Negative for abdominal  pain, constipation, diarrhea, nausea and vomiting.  Genitourinary:  Negative for dysuria.  Musculoskeletal:  Negative for arthralgias, back pain and myalgias.  Skin:  Negative for color change, rash and wound.  Neurological:  Negative for dizziness, weakness and headaches.  Psychiatric/Behavioral:  Negative for behavioral problems. The patient is not nervous/anxious.        Immunization History  Administered Date(s)  Administered   Influenza Whole 06/15/2018   Influenza,inj,Quad PF,6+ Mos 06/25/2013   Influenza-Unspecified 06/23/2020, 06/30/2021   Moderna Sars-Covid-2 Vaccination 09/13/2019, 10/11/2019, 07/20/2020, 02/08/2021   Pneumococcal Conjugate-13 03/11/2014   Pneumococcal Polysaccharide-23 09/11/1986, 09/11/2001, 06/25/2013   Tdap 03/16/2011, 12/22/2021   Unspecified SARS-COV-2 Vaccination 05/31/2021   Zoster Recombinat (Shingrix) 09/12/2007   Pertinent  Health Maintenance Due  Topic Date Due   INFLUENZA VACCINE  04/11/2022   DEXA SCAN  Completed      06/17/2019    7:28 PM 06/18/2019    7:46 AM 12/22/2021    9:00 AM 01/28/2022    3:26 PM 02/15/2022   11:35 AM  Fall Risk  Falls in the past year?     1  Was there an injury with Fall?     1  Fall Risk Category Calculator     3  Fall Risk Category     High  Patient Fall Risk Level High fall risk High fall risk High fall risk High fall risk High fall risk  Patient at Risk for Falls Due to     History of fall(s)  Fall risk Follow up     Falls evaluation completed     Vitals:   02/20/22 1006  BP: 124/74  Pulse: 67  Resp: 20  Temp: 98.2 F (36.8 C)  SpO2: 91%  Weight: 165 lb 9.6 oz (75.1 kg)  Height: '4\' 8"'$  (1.422 m)   Body mass index is 37.13 kg/m.  Physical Exam Constitutional:      Appearance: She is obese.  HENT:     Head: Normocephalic and atraumatic.     Nose: Nose normal.     Mouth/Throat:     Mouth: Mucous membranes are moist.  Eyes:     Conjunctiva/sclera: Conjunctivae normal.  Cardiovascular:     Rate and Rhythm: Normal rate and regular rhythm.  Pulmonary:     Effort: Pulmonary effort is normal.     Breath sounds: Normal breath sounds.  Abdominal:     General: Bowel sounds are normal.     Palpations: Abdomen is soft.  Musculoskeletal:        General: Normal range of motion.     Cervical back: Normal range of motion.  Skin:    General: Skin is warm and dry.  Neurological:     General: No focal deficit  present.     Mental Status: She is alert and oriented to person, place, and time.  Psychiatric:        Mood and Affect: Mood normal.        Behavior: Behavior normal.        Labs reviewed: Recent Labs    12/22/21 0913  NA 140  K 3.8  CL 109  CO2 23  GLUCOSE 111*  BUN 10  CREATININE 0.68  CALCIUM 8.8*   No results for input(s): "AST", "ALT", "ALKPHOS", "BILITOT", "PROT", "ALBUMIN" in the last 8760 hours. Recent Labs    12/22/21 0913  WBC 7.5  NEUTROABS 4.4  HGB 16.4*  HCT 48.4*  MCV 98.0  PLT 166   Lab Results  Component  Value Date   TSH 0.52 09/22/2021   Lab Results  Component Value Date   HGBA1C 5.7 06/18/2018   Lab Results  Component Value Date   CHOL 188 01/30/2018   HDL 64 01/30/2018   LDLCALC 106 (H) 01/30/2018   TRIG 89 01/30/2018   CHOLHDL 2.9 01/30/2018    Significant Diagnostic Results in last 30 days:  CT Head Wo Contrast  Result Date: 01/28/2022 CLINICAL DATA:  88 year old female with fall and head injury. Initial encounter. EXAM: CT HEAD WITHOUT CONTRAST TECHNIQUE: Contiguous axial images were obtained from the base of the skull through the vertex without intravenous contrast. RADIATION DOSE REDUCTION: This exam was performed according to the departmental dose-optimization program which includes automated exposure control, adjustment of the mA and/or kV according to patient size and/or use of iterative reconstruction technique. COMPARISON:  12/22/2021 CT FINDINGS: Brain: No evidence of acute infarction, hemorrhage, hydrocephalus, extra-axial collection or mass lesion/mass effect. Atrophy and chronic small-vessel white matter ischemic changes again noted. Vascular: Carotid atherosclerotic calcifications are noted. Skull: Normal. Negative for fracture or focal lesion. Sinuses/Orbits: No acute finding. Other: Anterior scalp soft tissue swelling noted. IMPRESSION: 1. No evidence of acute intracranial abnormality. 2. Anterior scalp soft tissue swelling  without fracture. 3. Atrophy and chronic small-vessel white matter ischemic changes. Electronically Signed   By: Margarette Canada M.D.   On: 01/28/2022 18:12    Assessment/Plan  1. Acquired hypothyroidism -  tsh 0.24, low, will decrease Levothyroxine from 100 mcg daily to 88 mcg daily -  repeat tsh in 6 weeks  2. Dementia without behavioral disturbance (HCC) -  has severe cognitive impairment -  continue Donepezil -   continue supportive care    Family/ staff Communication: Discussed plan of care with resident and charge nurse.  Labs/tests ordered:  tsh in 6 weeks    Durenda Age, DNP, MSN, FNP-BC [facility] [phone] (Monday-Friday 8:00 a.m. - 5:00 p.m.) [phone] (after hours)

## 2022-02-28 DIAGNOSIS — F330 Major depressive disorder, recurrent, mild: Secondary | ICD-10-CM | POA: Diagnosis not present

## 2022-02-28 DIAGNOSIS — F411 Generalized anxiety disorder: Secondary | ICD-10-CM | POA: Diagnosis not present

## 2022-02-28 DIAGNOSIS — F02A0 Dementia in other diseases classified elsewhere, mild, without behavioral disturbance, psychotic disturbance, mood disturbance, and anxiety: Secondary | ICD-10-CM | POA: Diagnosis not present

## 2022-04-03 DIAGNOSIS — F411 Generalized anxiety disorder: Secondary | ICD-10-CM | POA: Diagnosis not present

## 2022-04-03 DIAGNOSIS — F330 Major depressive disorder, recurrent, mild: Secondary | ICD-10-CM | POA: Diagnosis not present

## 2022-04-03 DIAGNOSIS — F02A0 Dementia in other diseases classified elsewhere, mild, without behavioral disturbance, psychotic disturbance, mood disturbance, and anxiety: Secondary | ICD-10-CM | POA: Diagnosis not present

## 2022-04-04 DIAGNOSIS — E039 Hypothyroidism, unspecified: Secondary | ICD-10-CM | POA: Diagnosis not present

## 2022-04-04 LAB — TSH: TSH: 0.26 — AB (ref 0.41–5.90)

## 2022-04-07 ENCOUNTER — Encounter: Payer: Self-pay | Admitting: Nurse Practitioner

## 2022-04-07 ENCOUNTER — Telehealth: Payer: Self-pay

## 2022-04-07 ENCOUNTER — Non-Acute Institutional Stay: Payer: Medicare (Managed Care) | Admitting: Nurse Practitioner

## 2022-04-07 DIAGNOSIS — E039 Hypothyroidism, unspecified: Secondary | ICD-10-CM | POA: Diagnosis not present

## 2022-04-07 DIAGNOSIS — F0390 Unspecified dementia without behavioral disturbance: Secondary | ICD-10-CM | POA: Diagnosis not present

## 2022-04-07 DIAGNOSIS — F339 Major depressive disorder, recurrent, unspecified: Secondary | ICD-10-CM

## 2022-04-07 NOTE — Progress Notes (Unsigned)
Location:  [facility] Room Number: AL/821/A Place of Service:  ALF (13) Provider:  Leland Staszewski X, NP  Mahlon Gammon, MD  Patient Care Team: Mahlon Gammon, MD as PCP - General (Internal Medicine) Lisa Barrera X, NP as Nurse Practitioner (Internal Medicine)  Extended Emergency Contact Information Primary Emergency Contact: Poynor,[name] Address: [address]          [facility] [place], [place] [address] Darden Amber of [place] Home Phone: [phone] Mobile Phone: [phone] Relation: Son Secondary Emergency Contact: Beer,Brett Address: [address]          [place], [place] [address] Darden Amber of [place] Home Phone: [phone] Work Phone: [phone] Mobile Phone: [phone] Relation: Son  Code Status:  DNR Goals of care: Advanced Directive information    04/07/2022   10:29 AM  Advanced Directives  Does Patient Have a Medical Advance Directive? Yes  Type of Estate agent of [place];Living will;Out of facility DNR (pink MOST or yellow form)  Does patient want to make changes to medical advance directive? No - Patient declined  Copy of Healthcare Power of Attorney in Chart? Yes - validated most recent copy scanned in chart (See row information)  Pre-existing out of facility DNR order (yellow form or pink MOST form) Pink MOST/Yellow Form most recent copy in chart - Physician notified to receive inpatient order     Chief Complaint  Patient presents with   Medical Management of Chronic Issues    Patient is being seen for hypothyroidism     HPI:  Pt is a 88 y.o. female seen today for an acute visit for    Past Medical History:  Diagnosis Date   Eczema    Hypothyroid    Hypothyroidism    Hypothyroidism    Vitamin D deficiency    Past Surgical History:  Procedure Laterality Date   CHOLECYSTECTOMY N/A 06/24/2013   Procedure: LAPAROSCOPIC CHOLECYSTECTOMY WITH INTRAOPERATIVE CHOLANGIOGRAM;  Surgeon: Axel Filler,  MD;  Location: [facility];  Service: General;  Laterality: N/A;   TONSILLECTOMY      No Known Allergies  Outpatient Encounter Medications as of 04/07/2022  Medication Sig   aspirin EC 81 MG tablet Take 81 mg by mouth daily. Swallow whole.   calcium-vitamin D (OSCAL WITH D) 500-200 MG-UNIT tablet Take 1 tablet by mouth 2 (two) times daily.   donepezil (ARICEPT) 5 MG tablet Take 1 tablet (5 mg total) by mouth at bedtime.   escitalopram (LEXAPRO) 10 MG tablet Take 15 mg by mouth every morning.   levothyroxine (SYNTHROID) 88 MCG tablet Take 88 mcg by mouth daily.   LORazepam (ATIVAN) 0.5 MG tablet Take 0.5 mg by mouth every 8 (eight) hours as needed for anxiety. agitation   melatonin 1 MG TABS tablet Take 1 mg by mouth at bedtime.   No facility-administered encounter medications on file as of 04/07/2022.    Review of Systems  Immunization History  Administered Date(s) Administered   Influenza Whole 06/15/2018   Influenza,inj,Quad PF,6+ Mos 06/25/2013   Influenza-Unspecified 06/23/2020, 06/30/2021   Moderna Sars-Covid-2 Vaccination 09/13/2019, 10/11/2019, 07/20/2020, 02/08/2021   Pneumococcal Conjugate-13 03/11/2014   Pneumococcal Polysaccharide-23 09/11/1986, 09/11/2001, 06/25/2013   Tdap 03/16/2011, 12/22/2021   Unspecified SARS-COV-2 Vaccination 05/31/2021   Zoster Recombinat (Shingrix) 09/12/2007   Pertinent  Health Maintenance Due  Topic Date Due   INFLUENZA VACCINE  04/11/2022   DEXA SCAN  Completed      06/18/2019    7:46 AM 12/22/2021  9:00 AM 01/28/2022    3:26 PM 02/15/2022   11:35 AM 04/07/2022   10:29 AM  Fall Risk  Falls in the past year?    1 1  Was there an injury with Fall?    1 1  Fall Risk Category Calculator    3 3  Fall Risk Category    High High  Patient Fall Risk Level High fall risk High fall risk High fall risk High fall risk High fall risk  Patient at Risk for Falls Due to    History of fall(s) History of fall(s)  Fall risk Follow up    Falls evaluation  completed Falls evaluation completed;Education provided   Functional Status Survey:    Vitals:   04/07/22 1030  BP: 118/60  Pulse: 64  Resp: 18  Temp: 98 F (36.7 C)  SpO2: 90%  Weight: 164 lb (74.4 kg)  Height: 4\' 8"  (1.422 m)   Body mass index is 36.77 kg/m. Physical Exam  Labs reviewed: Recent Labs    12/22/21 0913  NA 140  K 3.8  CL 109  CO2 23  GLUCOSE 111*  BUN 10  CREATININE 0.68  CALCIUM 8.8*   No results for input(s): "AST", "ALT", "ALKPHOS", "BILITOT", "PROT", "ALBUMIN" in the last 8760 hours. Recent Labs    12/22/21 0913  WBC 7.5  NEUTROABS 4.4  HGB 16.4*  HCT 48.4*  MCV 98.0  PLT 166   Lab Results  Component Value Date   TSH 0.52 09/22/2021   Lab Results  Component Value Date   HGBA1C 5.7 06/18/2018   Lab Results  Component Value Date   CHOL 188 01/30/2018   HDL 64 01/30/2018   LDLCALC 106 (H) 01/30/2018   TRIG 89 01/30/2018   CHOLHDL 2.9 01/30/2018    Significant Diagnostic Results in last 30 days:  No results found.  Assessment/Plan There are no diagnoses linked to this encounter.   Family/ staff Communication: ***  Labs/tests ordered:  ***

## 2022-04-07 NOTE — Assessment & Plan Note (Signed)
Her mood is stabilizing on Escitalopram, prn Lorazepam

## 2022-04-07 NOTE — Telephone Encounter (Signed)
Attending physician statement was faxed on 7/25. Return call to confirm fax was or was not received.

## 2022-04-07 NOTE — Assessment & Plan Note (Signed)
persisted low TSH, 0.24 02/17/11, then 0.26 04/04/22, Levothyroxine decreased to 4mg/100mcg 02/20/22. The patient denied increased fatigue, lethargy, constipation, irritability, or sensitive to cold. Will decrease Levothyroxine 782m/88mcg, repeat TSH 6 weeks.

## 2022-04-07 NOTE — Assessment & Plan Note (Signed)
lack of safety awareness,  resides in [facility] for safety, care assistance, on Donepezil for memory. MMSE 22/30 08/22/21 

## 2022-04-07 NOTE — Progress Notes (Unsigned)
Location:   [facility]   Place of Service:    Provider: [facility] Serigne Kubicek NP  [name], MD  Patient Care Team: [name], MD as PCP - General (Internal Medicine) Telsa Dillavou X, NP as Nurse Practitioner (Internal Medicine)  Extended Emergency Contact Information Primary Emergency Contact: Landgrebe,[name] Address: [address]          [place], [place] [address] [name] of [place] Phone: [phone] Mobile Phone: [phone] Relation: Son Secondary Emergency Contact: Hedding,Brett Address: [address]          [place], [place] [address] [name] of [place] Phone: [phone] Work Phone: [phone] Mobile Phone: [phone] Relation: Son  Code Status: DNR Goals of care: Advanced Directive information    04/07/2022   10:29 AM  Advanced Directives  Does Patient Have a Medical Advance Directive? Yes  Type of Paramedic of [place];Living will;Out of facility DNR (pink MOST or yellow form)  Does patient want to make changes to medical advance directive? No - Patient declined  Copy of [place] in Chart? Yes - validated most recent copy scanned in chart (See row information)  Pre-existing out of facility DNR order (yellow form or pink MOST form) Pink MOST/Yellow Form most recent copy in chart - Physician notified to receive inpatient order     Chief Complaint  Patient presents with   Acute Visit    Low TSH    HPI:  Pt is a 88 y.o. female seen today for an acute visit for persisted low TSH, 0.24 02/17/11, then 0.26 04/04/22, Levothyroxine decreased to 7mg/100mcg 02/20/22. The patient denied increased fatigue, lethargy, constipation, irritability, or sensitive to cold.              Gait abnormality, uses walker             Dementia, lack of safety awareness,  [facility], on Donepezil for memory. MMSE 22/30 08/22/21             Her mood is stabilizing on Escitalopram, prn  Lorazepam             Hypothyroidism, takes Levothyroxine         Past Medical History:  Diagnosis Date   Eczema    Hypothyroid    Hypothyroidism    Hypothyroidism    Vitamin D deficiency    Past Surgical History:  Procedure Laterality Date   CHOLECYSTECTOMY N/A 06/24/2013   Procedure: LAPAROSCOPIC CHOLECYSTECTOMY WITH INTRAOPERATIVE CHOLANGIOGRAM;  Surgeon: ARalene Ok MD;  [facility]  Service: General;  Laterality: N/A;   TONSILLECTOMY      No Known Allergies  Allergies as of 04/07/2022   No Known Allergies      Medication List        Accurate as of April 07, 2022 11:59 PM. If you have any questions, ask your nurse or doctor.          aspirin EC 81 MG tablet Take 81 mg by mouth daily. Swallow whole.   calcium-vitamin D 500-200 MG-UNIT tablet Commonly known as: OSCAL WITH D Take 1 tablet by mouth 2 (two) times daily.   donepezil 5 MG tablet Commonly known as: ARICEPT Take 1 tablet (5 mg total) by mouth at bedtime.   escitalopram 10 MG tablet Commonly known as: LEXAPRO Take 15 mg by mouth every morning.   levothyroxine 88 MCG tablet Commonly known as: SYNTHROID Take 88 mcg by mouth  daily.   LORazepam 0.5 MG tablet Commonly known as: ATIVAN Take 0.5 mg by mouth every 8 (eight) hours as needed for anxiety. agitation   melatonin 1 MG Tabs tablet Take 1 mg by mouth at bedtime.        Review of Systems  Constitutional:  Negative for activity change, appetite change and fever.  HENT:  Positive for hearing loss. Negative for congestion and voice change.   Eyes:  Negative for visual disturbance.  Respiratory:  Negative for shortness of breath.   Gastrointestinal:  Negative for abdominal pain and constipation.  Genitourinary:  Negative for dysuria and urgency.  Musculoskeletal:  Positive for gait problem.  Skin:  Negative for color change.  Neurological:  Negative for speech difficulty, weakness and light-headedness.       Memory lapses.    Psychiatric/Behavioral:  Positive for confusion. Negative for sleep disturbance. The patient is not nervous/anxious.     Immunization History  Administered Date(s) Administered   Influenza Whole 06/15/2018   Influenza,inj,Quad PF,6+ Mos 06/25/2013   Influenza-Unspecified 06/23/2020, 06/30/2021   Moderna Sars-Covid-2 Vaccination 09/13/2019, 10/11/2019, 07/20/2020, 02/08/2021   Pneumococcal Conjugate-13 03/11/2014   Pneumococcal Polysaccharide-23 09/11/1986, 09/11/2001, 06/25/2013   Tdap 03/16/2011, 12/22/2021   Unspecified SARS-COV-2 Vaccination 05/31/2021   Zoster Recombinat (Shingrix) 09/12/2007   Pertinent  Health Maintenance Due  Topic Date Due   INFLUENZA VACCINE  04/11/2022   DEXA SCAN  Completed      06/18/2019    7:46 AM 12/22/2021    9:00 AM 01/28/2022    3:26 PM 02/15/2022   11:35 AM 04/07/2022   10:29 AM  Fall Risk  Falls in the past year?    1 1  Was there an injury with Fall?    1 1  Fall Risk Category Calculator    3 3  Fall Risk Category    High High  Patient Fall Risk Level High fall risk High fall risk High fall risk High fall risk High fall risk  Patient at Risk for Falls Due to    History of fall(s) History of fall(s)  Fall risk Follow up    Falls evaluation completed Falls evaluation completed;Education provided   Functional Status Survey:    Vitals:   04/07/22 1402  BP: 118/60  Pulse: 64  Resp: 18  Temp: 98 F (36.7 C)  SpO2: 98%  Weight: 164 lb (74.4 kg)  Height: '4\' 8"'$  (1.422 m)   Body mass index is 36.77 kg/m. Physical Exam Vitals and nursing note reviewed.  Constitutional:      Appearance: Normal appearance.  HENT:     Head: Normocephalic and atraumatic.     Nose: No congestion.     Mouth/Throat:     Mouth: Mucous membranes are moist.  Eyes:     Extraocular Movements: Extraocular movements intact.     Conjunctiva/sclera: Conjunctivae normal.     Pupils: Pupils are equal, round, and reactive to light.  Cardiovascular:     Rate and  Rhythm: Normal rate and regular rhythm.     Heart sounds: No murmur heard. Pulmonary:     Effort: Pulmonary effort is normal.     Breath sounds: No rales.  Abdominal:     General: Bowel sounds are normal.     Palpations: Abdomen is soft.     Tenderness: There is no abdominal tenderness.     Hernia: A hernia is present.     Comments: Umbilical hernia.   Musculoskeletal:     Cervical back: Normal  range of motion and neck supple.     Right lower leg: No edema.     Left lower leg: No edema.  Skin:    General: Skin is warm and dry.     Findings: No bruising.  Neurological:     General: No focal deficit present.     Mental Status: She is alert. Mental status is at baseline.     Gait: Gait abnormal.     Comments: Oriented to person, place.   Psychiatric:        Mood and Affect: Mood normal.        Behavior: Behavior normal.     Labs reviewed: Recent Labs    12/22/21 0913  NA 140  K 3.8  CL 109  CO2 23  GLUCOSE 111*  BUN 10  CREATININE 0.68  CALCIUM 8.8*   No results for input(s): "AST", "ALT", "ALKPHOS", "BILITOT", "PROT", "ALBUMIN" in the last 8760 hours. Recent Labs    12/22/21 0913  WBC 7.5  NEUTROABS 4.4  HGB 16.4*  HCT 48.4*  MCV 98.0  PLT 166   Lab Results  Component Value Date   TSH 0.52 09/22/2021   Lab Results  Component Value Date   HGBA1C 5.7 06/18/2018   Lab Results  Component Value Date   CHOL 188 01/30/2018   HDL 64 01/30/2018   LDLCALC 106 (H) 01/30/2018   TRIG 89 01/30/2018   CHOLHDL 2.9 01/30/2018    Significant Diagnostic Results in last 30 days:  No results found.  Assessment/Plan: Hypothyroidism  persisted low TSH, 0.24 02/17/11, then 0.26 04/04/22, Levothyroxine decreased to 27mg/100mcg 02/20/22. The patient denied increased fatigue, lethargy, constipation, irritability, or sensitive to cold. Will decrease Levothyroxine 726m/88mcg, repeat TSH 6 weeks.   Dementia without behavioral disturbance (HCC)  lack of safety awareness,   [facility] assistance, on Donepezil for memory. MMSE 22/30 08/22/21  Depression, recurrent (HCC) Her mood is stabilizing on Escitalopram, prn Lorazepam    Family/ staff Communication: plan of care reviewed with the patient and charge nurse.   Labs/tests ordered:  TSH 6 weeks.   Time spend 40 minutes.

## 2022-04-09 ENCOUNTER — Encounter: Payer: Self-pay | Admitting: Nurse Practitioner

## 2022-04-09 NOTE — Progress Notes (Signed)
This encounter was created in error - please disregard.

## 2022-04-10 NOTE — Telephone Encounter (Signed)
Called and spoke with [name], will refax form.

## 2022-05-08 DIAGNOSIS — F411 Generalized anxiety disorder: Secondary | ICD-10-CM | POA: Diagnosis not present

## 2022-05-08 DIAGNOSIS — F02A0 Dementia in other diseases classified elsewhere, mild, without behavioral disturbance, psychotic disturbance, mood disturbance, and anxiety: Secondary | ICD-10-CM | POA: Diagnosis not present

## 2022-05-08 DIAGNOSIS — F330 Major depressive disorder, recurrent, mild: Secondary | ICD-10-CM | POA: Diagnosis not present

## 2022-05-09 ENCOUNTER — Encounter: Payer: Self-pay | Admitting: Adult Health

## 2022-05-09 ENCOUNTER — Non-Acute Institutional Stay: Payer: Medicare (Managed Care) | Admitting: Nurse Practitioner

## 2022-05-09 ENCOUNTER — Encounter: Payer: Self-pay | Admitting: Nurse Practitioner

## 2022-05-09 DIAGNOSIS — E039 Hypothyroidism, unspecified: Secondary | ICD-10-CM

## 2022-05-09 DIAGNOSIS — R269 Unspecified abnormalities of gait and mobility: Secondary | ICD-10-CM

## 2022-05-09 DIAGNOSIS — L219 Seborrheic dermatitis, unspecified: Secondary | ICD-10-CM | POA: Diagnosis not present

## 2022-05-09 DIAGNOSIS — F339 Major depressive disorder, recurrent, unspecified: Secondary | ICD-10-CM

## 2022-05-09 DIAGNOSIS — D560 Alpha thalassemia: Secondary | ICD-10-CM

## 2022-05-09 DIAGNOSIS — F0390 Unspecified dementia without behavioral disturbance: Secondary | ICD-10-CM

## 2022-05-09 NOTE — Assessment & Plan Note (Signed)
uses walker.  

## 2022-05-09 NOTE — Progress Notes (Addendum)
Location:   [facility] Room Number: 161 Place of Service:  ALF (13) Provider: Lennie Odor Nicolae Vasek NP  [name], MD  Patient Care Team: [name], MD as PCP - General (Internal Medicine) Shatasia Cutshaw X, NP as Nurse Practitioner (Internal Medicine)  Extended Emergency Contact Information Primary Emergency Contact: Hazzard,[name] Address: [address]          [place], [place] [address] [name] of [place] Phone: [phone] Mobile Phone: [phone] Relation: Son Secondary Emergency Contact: Melgarejo,Brett Address: [address]          [place], [place] [address] [name] of [place] Phone: [phone] Work Phone: [phone] Mobile Phone: [phone] Relation: Son  Code Status: DNR Goals of care: Advanced Directive information    04/07/2022   10:29 AM  Advanced Directives  Does Patient Have a Medical Advance Directive? Yes  Type of Paramedic of [place];Living will;Out of facility DNR (pink MOST or yellow form)  Does patient want to make changes to medical advance directive? No - Patient declined  Copy of [place] in Chart? Yes - validated most recent copy scanned in chart (See row information)  Pre-existing out of facility DNR order (yellow form or pink MOST form) Pink MOST/Yellow Form most recent copy in chart - Physician notified to receive inpatient order     Chief Complaint  Patient presents with   Acute Visit    Facial rash, anxiety.     HPI:  Pt is a 88 y.o. female seen today for an acute visit for facial rash, scaly, itching, mostly in skin folds and chins.   Reported the patient has been anxious at times.   TSH, 0.24 02/17/11, then 0.26 04/04/22, Levothyroxine decreased to 1mcg 04/07/22, 31mcg/100mcg 02/20/22. Pending TSH 6 weeks.             Gait abnormality, uses walker             Dementia, lack of safety awareness,  resides in [facility] for safety, care assistance, on Donepezil for  memory. MMSE 22/30 08/22/21             Her mood is stabilizing, but still anxious at times, on Escitalopram, prn Lorazepam             Hypothyroidism, takes Levothyroxine  Past Medical History:  Diagnosis Date   Eczema    Hypothyroid    Hypothyroidism    Hypothyroidism    Vitamin D deficiency    Past Surgical History:  Procedure Laterality Date   CHOLECYSTECTOMY N/A 06/24/2013   Procedure: LAPAROSCOPIC CHOLECYSTECTOMY WITH INTRAOPERATIVE CHOLANGIOGRAM;  Surgeon: Ralene Ok, MD;  Location: [facility];  Service: General;  Laterality: N/A;   TONSILLECTOMY      No Known Allergies  Allergies as of 05/09/2022   No Known Allergies      Medication List        Accurate as of May 09, 2022 11:59 PM. If you have any questions, ask your nurse or doctor.          aspirin EC 81 MG tablet Take 81 mg by mouth daily. Swallow whole.   calcium-vitamin D 500-200 MG-UNIT tablet Commonly known as: OSCAL WITH D Take 1 tablet by mouth 2 (two) times daily.   donepezil 5 MG tablet Commonly known as: ARICEPT Take 1 tablet (5 mg total) by mouth at bedtime.   escitalopram 10 MG tablet Commonly known as: LEXAPRO Take 15 mg by mouth every  morning.   levothyroxine 88 MCG tablet Commonly known as: SYNTHROID Take 88 mcg by mouth daily.   LORazepam 0.5 MG tablet Commonly known as: ATIVAN Take 0.5 mg by mouth every 8 (eight) hours as needed for anxiety. agitation   melatonin 1 MG Tabs tablet Take 1 mg by mouth at bedtime.        Review of Systems  Constitutional:  Negative for activity change, appetite change and fever.  HENT:  Positive for hearing loss. Negative for congestion and voice change.   Eyes:  Negative for visual disturbance.  Respiratory:  Negative for shortness of breath.   Gastrointestinal:  Negative for abdominal pain and constipation.  Genitourinary:  Negative for dysuria and urgency.  Musculoskeletal:  Positive for gait problem.  Skin:  Positive for rash.  Negative for color change.  Neurological:  Negative for speech difficulty, weakness and light-headedness.       Memory lapses.   Psychiatric/Behavioral:  Positive for confusion. Negative for sleep disturbance. The patient is nervous/anxious.     Immunization History  Administered Date(s) Administered   Influenza Whole 06/15/2018   Influenza,inj,Quad PF,6+ Mos 06/25/2013   Influenza-Unspecified 06/23/2020, 06/30/2021   Moderna Sars-Covid-2 Vaccination 09/13/2019, 10/11/2019, 07/20/2020, 02/08/2021   Pneumococcal Conjugate-13 03/11/2014   Pneumococcal Polysaccharide-23 09/11/1986, 09/11/2001, 06/25/2013   Tdap 03/16/2011, 12/22/2021   Unspecified SARS-COV-2 Vaccination 05/31/2021   Zoster Recombinat (Shingrix) 09/12/2007   Pertinent  Health Maintenance Due  Topic Date Due   INFLUENZA VACCINE  04/11/2022   DEXA SCAN  Completed      06/18/2019    7:46 AM 12/22/2021    9:00 AM 01/28/2022    3:26 PM 02/15/2022   11:35 AM 04/07/2022   10:29 AM  Fall Risk  Falls in the past year?    1 1  Was there an injury with Fall?    1 1  Fall Risk Category Calculator    3 3  Fall Risk Category    High High  Patient Fall Risk Level High fall risk High fall risk High fall risk High fall risk High fall risk  Patient at Risk for Falls Due to    History of fall(s) History of fall(s)  Fall risk Follow up    Falls evaluation completed Falls evaluation completed;Education provided   Functional Status Survey:    Vitals:   05/09/22 1426  BP: 117/64  Pulse: 62  Resp: 20  Temp: (!) 97.2 F (36.2 C)  SpO2: 96%  Weight: 164 lb (74.4 kg)  Height: $Remove'4\' 8"'YHAGEcs$  (1.422 m)   Body mass index is 36.77 kg/m. Physical Exam Vitals and nursing note reviewed.  Constitutional:      Appearance: Normal appearance.  HENT:     Head: Normocephalic and atraumatic.     Nose: No congestion.     Mouth/Throat:     Mouth: Mucous membranes are moist.  Eyes:     Extraocular Movements: Extraocular movements intact.      Conjunctiva/sclera: Conjunctivae normal.     Pupils: Pupils are equal, round, and reactive to light.  Cardiovascular:     Rate and Rhythm: Normal rate and regular rhythm.     Heart sounds: No murmur heard. Pulmonary:     Effort: Pulmonary effort is normal.     Breath sounds: No rales.  Abdominal:     General: Bowel sounds are normal.     Palpations: Abdomen is soft.     Tenderness: There is no abdominal tenderness.     Hernia: A hernia  is present.     Comments: Umbilical hernia.   Musculoskeletal:     Cervical back: Normal range of motion and neck supple.     Right lower leg: No edema.     Left lower leg: No edema.  Skin:    General: Skin is warm and dry.     Findings: Rash present.     Comments: Scaly facial rash in skin folds and chin, itching.   Neurological:     General: No focal deficit present.     Mental Status: She is alert. Mental status is at baseline.     Gait: Gait abnormal.     Comments: Oriented to person, place.   Psychiatric:        Mood and Affect: Mood normal.        Behavior: Behavior normal.     Labs reviewed: Recent Labs    12/22/21 0913  NA 140  K 3.8  CL 109  CO2 23  GLUCOSE 111*  BUN 10  CREATININE 0.68  CALCIUM 8.8*   No results for input(s): "AST", "ALT", "ALKPHOS", "BILITOT", "PROT", "ALBUMIN" in the last 8760 hours. Recent Labs    12/22/21 0913  WBC 7.5  NEUTROABS 4.4  HGB 16.4*  HCT 48.4*  MCV 98.0  PLT 166   Lab Results  Component Value Date   TSH 0.52 09/22/2021   Lab Results  Component Value Date   HGBA1C 5.7 06/18/2018   Lab Results  Component Value Date   CHOL 188 01/30/2018   HDL 64 01/30/2018   LDLCALC 106 (H) 01/30/2018   TRIG 89 01/30/2018   CHOLHDL 2.9 01/30/2018    Significant Diagnostic Results in last 30 days:  No results found.  Assessment/Plan: Depression, recurrent ([place]) Her mood is stabilizing, but still anxious at times, increase Escitalopram to 20mg  qd, on prn Lorazepam. Update CBC/diff,  CMP/eGFR, TSH  Seborrheic dermatitis, unspecified facial rash, scaly, itching, mostly in skin folds and chins. Apply 1% Hydrocortisone cream, 2% Ketoconazole cream to facial rash away from eyes for 2 weeks.  05/18/22 no better, adding Doxycycline 100mg  bid x 7 days.   Hypothyroidism TSH, 0.24 02/17/11, then 0.26 04/04/22, Levothyroxine decreased to 78mcg 04/07/22, 72mcg/100mcg 02/20/22. Pending TSH 6 weeks.  Gait abnormality uses walker  Dementia without behavioral disturbance (HCC)  lack of safety awareness,  resides in [facility] for safety, care assistance, on Donepezil for memory. 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     Family/ staff Communication: plan of care reviewed with the patient and charge nurse.   Labs/tests ordered: CBC/diff, CMP/eGFR, TSH  Time spend 40 minutes.

## 2022-05-09 NOTE — Assessment & Plan Note (Signed)
TSH, 0.24 02/17/11, then 0.26 04/04/22, Levothyroxine decreased to 82mg 04/07/22, 86m/100mcg 02/20/22. Pending TSH 6 weeks.

## 2022-05-09 NOTE — Assessment & Plan Note (Signed)
Her mood is stabilizing, but still anxious at times, increase Escitalopram to 20mg qd, on prn Lorazepam. Update CBC/diff, CMP/eGFR, TSH 

## 2022-05-09 NOTE — Assessment & Plan Note (Signed)
lack of safety awareness,  resides in [facility] for safety, care assistance, on Donepezil for memory. MMSE 22/30 08/22/21 

## 2022-05-09 NOTE — Progress Notes (Signed)
This encounter was created in error - please disregard.

## 2022-05-09 NOTE — Assessment & Plan Note (Addendum)
facial rash, scaly, itching, mostly in skin folds and chins. Apply 1% Hydrocortisone cream, 2% Ketoconazole cream to facial rash away from eyes for 2 weeks.  05/18/22 no better, adding Doxycycline '100mg'$  bid x 7 days.

## 2022-05-11 DIAGNOSIS — I10 Essential (primary) hypertension: Secondary | ICD-10-CM | POA: Diagnosis not present

## 2022-05-11 DIAGNOSIS — E039 Hypothyroidism, unspecified: Secondary | ICD-10-CM | POA: Diagnosis not present

## 2022-05-11 LAB — HEPATIC FUNCTION PANEL
ALT: 12 U/L (ref 7–35)
AST: 15 (ref 13–35)
Alkaline Phosphatase: 66 (ref 25–125)
Bilirubin, Total: 1

## 2022-05-11 LAB — BASIC METABOLIC PANEL
BUN: 10 (ref 4–21)
CO2: 23 — AB (ref 13–22)
Chloride: 104 (ref 99–108)
Creatinine: 0.8 (ref 0.5–1.1)
Glucose: 147
Potassium: 3.9 mEq/L (ref 3.5–5.1)
Sodium: 137 (ref 137–147)

## 2022-05-11 LAB — CBC AND DIFFERENTIAL
Hemoglobin: 16 (ref 12.0–16.0)
Neutrophils Absolute: 3891
Platelets: 181 10*3/uL (ref 150–400)
WBC: 7.6

## 2022-05-11 LAB — COMPREHENSIVE METABOLIC PANEL
Albumin: 3.5 (ref 3.5–5.0)
Calcium: 8.8 (ref 8.7–10.7)
Globulin: 2.8
eGFR: 73

## 2022-05-11 LAB — CBC: RBC: 4.82 (ref 3.87–5.11)

## 2022-05-12 DIAGNOSIS — D560 Alpha thalassemia: Secondary | ICD-10-CM | POA: Insufficient documentation

## 2022-05-12 NOTE — Assessment & Plan Note (Signed)
05/11/22 Hgb 16.0, HCT 47.1, RBC 4.82, plt 181, will repeat CBC/diff in one month, may consider hematology consultation if no better.

## 2022-06-05 DIAGNOSIS — F411 Generalized anxiety disorder: Secondary | ICD-10-CM | POA: Diagnosis not present

## 2022-06-05 DIAGNOSIS — F330 Major depressive disorder, recurrent, mild: Secondary | ICD-10-CM | POA: Diagnosis not present

## 2022-06-05 DIAGNOSIS — F02A0 Dementia in other diseases classified elsewhere, mild, without behavioral disturbance, psychotic disturbance, mood disturbance, and anxiety: Secondary | ICD-10-CM | POA: Diagnosis not present

## 2022-06-12 ENCOUNTER — Non-Acute Institutional Stay: Payer: Medicare (Managed Care) | Admitting: Nurse Practitioner

## 2022-06-12 ENCOUNTER — Encounter: Payer: Self-pay | Admitting: Nurse Practitioner

## 2022-06-12 DIAGNOSIS — S0003XA Contusion of scalp, initial encounter: Secondary | ICD-10-CM

## 2022-06-12 DIAGNOSIS — W19XXXS Unspecified fall, sequela: Secondary | ICD-10-CM | POA: Diagnosis not present

## 2022-06-12 DIAGNOSIS — F0390 Unspecified dementia without behavioral disturbance: Secondary | ICD-10-CM | POA: Diagnosis not present

## 2022-06-12 DIAGNOSIS — E039 Hypothyroidism, unspecified: Secondary | ICD-10-CM

## 2022-06-12 DIAGNOSIS — R269 Unspecified abnormalities of gait and mobility: Secondary | ICD-10-CM

## 2022-06-12 DIAGNOSIS — F339 Major depressive disorder, recurrent, unspecified: Secondary | ICD-10-CM

## 2022-06-12 NOTE — Assessment & Plan Note (Signed)
uses walker.  

## 2022-06-12 NOTE — Assessment & Plan Note (Signed)
Her mood is stabilizing, but still anxious at times, on Escitalopram, prn Lorazepam 

## 2022-06-12 NOTE — Assessment & Plan Note (Signed)
unwitnessed fall in her room, resulted a right parietal hematoma, declined ED eval, no new neurological symptoms noted upon my visit.

## 2022-06-12 NOTE — Assessment & Plan Note (Signed)
takes Levothyroxine, update TSH, CBC/diff, CMP/eGFR

## 2022-06-12 NOTE — Progress Notes (Signed)
Location: [facility] Room Number: 737/T Place of Service:  ALF [id number]) Provider:  [name], MD  Patient Care Team: [name], MD as PCP - General (Internal Medicine) Nazaire Cordial X, NP as Nurse Practitioner (Internal Medicine)  Extended Emergency Contact Information Primary Emergency Contact: Carley,[name] Address: [address]          [place], [place] [address] [name] of [place] Phone: [phone] Mobile Phone: [phone] Relation: Son Secondary Emergency Contact: Arango,Brett Address: [address]          [place], [place] [address] [name] of [place] Phone: [phone] Work Phone: [phone] Mobile Phone: [phone] Relation: Son  Code Status:  DNR   Managed Care Goals of care: Advanced Directive information    06/12/2022   10:51 AM  Advanced Directives  Does Patient Have a Medical Advance Directive? Yes  Type of Paramedic of [place];Living will;Out of facility DNR (pink MOST or yellow form)  Does patient want to make changes to medical advance directive? No - Patient declined  Copy of [place] in Chart? Yes - validated most recent copy scanned in chart (See row information)  Pre-existing out of facility DNR order (yellow form or pink MOST form) Pink Most/Yellow Form available - Physician notified to receive inpatient order;Yellow form placed in chart (order not valid for inpatient use)     Chief Complaint  Patient presents with   Acute Visit    Fall, scalp hematoma    HPI:  Pt is a 88 y.o. female seen today for an acute visit for unwitnessed fall in her room, resulted a right parietal hematoma, declined ED eval, no new neurological symptoms noted upon my visit.              Gait abnormality, uses walker             Dementia, lack of safety awareness,  resides in [facility] for safety, care assistance, on Donepezil for memory. MMSE 22/30  08/22/21             Her mood is stabilizing, but still anxious at times, on Escitalopram, prn Lorazepam             Hypothyroidism, takes Levothyroxine  Past Medical History:  Diagnosis Date   Eczema    Hypothyroid    Hypothyroidism    Hypothyroidism    Vitamin D deficiency    Past Surgical History:  Procedure Laterality Date   CHOLECYSTECTOMY N/A 06/24/2013   Procedure: LAPAROSCOPIC CHOLECYSTECTOMY WITH INTRAOPERATIVE CHOLANGIOGRAM;  Surgeon: Ralene Ok, MD;  Location: [facility];  Service: General;  Laterality: N/A;   TONSILLECTOMY      No Known Allergies  Allergies as of 06/12/2022   No Known Allergies      Medication List        Accurate as of June 12, 2022 11:59 PM. If you have any questions, ask your nurse or doctor.          aspirin EC 81 MG tablet Take 81 mg by mouth daily. Swallow whole.   calcium-vitamin D 500-200 MG-UNIT tablet Commonly known as: OSCAL WITH D Take 1 tablet by mouth 2 (two) times daily.   donepezil 5 MG tablet Commonly known as: ARICEPT Take 5 mg by mouth at bedtime. What changed: Another medication with the same name was removed. Continue taking this medication, and follow the directions you see here. Changed by: Genesia Caslin X Kyre Jeffries,  NP   escitalopram 10 MG tablet Commonly known as: LEXAPRO Take 15 mg by mouth every morning.   levothyroxine 88 MCG tablet Commonly known as: SYNTHROID Take 88 mcg by mouth daily.   LORazepam 0.5 MG tablet Commonly known as: ATIVAN Take 0.5 mg by mouth every 8 (eight) hours as needed for anxiety. agitation   melatonin 1 MG Tabs tablet Take 1 mg by mouth at bedtime.        Review of Systems  Constitutional:  Negative for activity change, appetite change and fever.  HENT:  Positive for hearing loss. Negative for congestion and voice change.   Eyes:  Negative for visual disturbance.  Respiratory:  Negative for shortness of breath.   Gastrointestinal:  Negative for abdominal pain and constipation.   Genitourinary:  Negative for dysuria and urgency.  Musculoskeletal:  Positive for gait problem.  Skin:  Positive for wound. Negative for color change.  Neurological:  Negative for speech difficulty, weakness and light-headedness.       Memory lapses.   Psychiatric/Behavioral:  Positive for confusion. Negative for sleep disturbance. The patient is nervous/anxious.     Immunization History  Administered Date(s) Administered   Influenza Whole 06/15/2018   Influenza,inj,Quad PF,6+ Mos 06/25/2013   Influenza-Unspecified 06/23/2020, 06/30/2021   Moderna Sars-Covid-2 Vaccination 09/13/2019, 10/11/2019, 07/20/2020, 02/08/2021   Pfizer Covid-19 Vaccine Bivalent Booster 62yrs & up 05/31/2021, 04/19/2022   Pneumococcal Conjugate-13 03/11/2014   Pneumococcal Polysaccharide-23 09/11/1986, 09/11/2001, 06/25/2013   Tdap 03/16/2011, 12/22/2021   Unspecified SARS-COV-2 Vaccination 05/31/2021   Zoster Recombinat (Shingrix) 09/12/2007   Pertinent  Health Maintenance Due  Topic Date Due   INFLUENZA VACCINE  04/11/2022   DEXA SCAN  Completed      06/18/2019    7:46 AM 12/22/2021    9:00 AM 01/28/2022    3:26 PM 02/15/2022   11:35 AM 04/07/2022   10:29 AM  Fall Risk  Falls in the past year?    1 1  Was there an injury with Fall?    1 1  Fall Risk Category Calculator    3 3  Fall Risk Category    High High  Patient Fall Risk Level High fall risk High fall risk High fall risk High fall risk High fall risk  Patient at Risk for Falls Due to    History of fall(s) History of fall(s)  Fall risk Follow up    Falls evaluation completed Falls evaluation completed;Education provided   Functional Status Survey:    Vitals:   06/12/22 1047  BP: 132/64  Pulse: 66  Resp: 18  Temp: 98 F (36.7 C)  SpO2: 96%  Weight: 165 lb (74.8 kg)  Height: $Remove'4\' 8"'JxDxoVo$  (1.422 m)   Body mass index is 36.99 kg/m. Physical Exam Vitals and nursing note reviewed.  Constitutional:      Appearance: Normal appearance.  HENT:      Head: Normocephalic and atraumatic.     Nose: No congestion.     Mouth/Throat:     Mouth: Mucous membranes are moist.  Eyes:     Extraocular Movements: Extraocular movements intact.     Conjunctiva/sclera: Conjunctivae normal.     Pupils: Pupils are equal, round, and reactive to light.  Cardiovascular:     Rate and Rhythm: Normal rate and regular rhythm.     Heart sounds: No murmur heard. Pulmonary:     Effort: Pulmonary effort is normal.     Breath sounds: No rales.  Abdominal:     General: Bowel  sounds are normal.     Palpations: Abdomen is soft.     Tenderness: There is no abdominal tenderness.     Hernia: A hernia is present.     Comments: Umbilical hernia.   Musculoskeletal:     Cervical back: Normal range of motion and neck supple.     Right lower leg: No edema.     Left lower leg: No edema.  Skin:    General: Skin is warm and dry.     Findings: Bruising present.     Comments: Hematoma right parietal region, no active bleeding or s/s of infection.   Neurological:     General: No focal deficit present.     Mental Status: She is alert. Mental status is at baseline.     Gait: Gait abnormal.     Comments: Oriented to person, place.   Psychiatric:        Mood and Affect: Mood normal.        Behavior: Behavior normal.     Labs reviewed: Recent Labs    12/22/21 0913 12/27/21 0000 02/16/22 0000 05/11/22 0000  NA 140 137 139 137  K 3.8 4.1 4.2 3.9  CL 109 102 104 104  CO2 23 22 29* 23*  GLUCOSE 111*  --   --   --   BUN $Re'10 10 12 10  'IbB$ CREATININE 0.68 0.8 0.7 0.8  CALCIUM 8.8* 9.3 9.1 8.8   Recent Labs    12/27/21 0000 02/16/22 0000 05/11/22 0000  AST $Re'27 14 15  'jlx$ ALT $R'14 11 12  'yf$ ALKPHOS 63 68 66  ALBUMIN 3.6 3.5  3.5 3.5   Recent Labs    12/22/21 0913 12/27/21 0000 02/16/22 0000 05/11/22 0000  WBC 7.5 8.4 7.9 7.6  NEUTROABS 4.4 4,183.00 3,934.00 3,891.00  HGB 16.4* 16.6* 15.6  15.6 16.0  HCT 48.4* 49* 46  --   MCV 98.0  --   --   --   PLT 166  208 187 181   Lab Results  Component Value Date   TSH 0.26 (A) 04/04/2022   Lab Results  Component Value Date   HGBA1C 5.7 06/18/2018   Lab Results  Component Value Date   CHOL 188 01/30/2018   HDL 64 01/30/2018   LDLCALC 106 (H) 01/30/2018   TRIG 89 01/30/2018   CHOLHDL 2.9 01/30/2018    Significant Diagnostic Results in last 30 days:  No results found.  Assessment/Plan Hypothyroidism  takes Levothyroxine, update TSH, CBC/diff, CMP/eGFR  Fall unwitnessed fall in her room, resulted a right parietal hematoma, declined ED eval, no new neurological symptoms noted upon my visit. Needs close supervision/assistance for safety 2/2 to her progression of dementia and increased frailty.    Gait abnormality uses walker  Dementia without behavioral disturbance (HCC)  lack of safety awareness,  resides in [facility] for safety, care assistance, on Donepezil for memory. MMSE 22/30 08/22/21  Depression, recurrent (HCC) Her mood is stabilizing, but still anxious at times, on Escitalopram, prn Lorazepam  Scalp hematoma, initial encounter unwitnessed fall in her room, resulted a right parietal hematoma, declined ED eval, no new neurological symptoms noted upon my visit.    Family/ staff Communication: plan of care reviewed with the patient and charge nurse.   Labs/tests ordered:   TSH, CBC/diff, CMP/eGFR  Time spend 40 minutes.

## 2022-06-12 NOTE — Assessment & Plan Note (Signed)
unwitnessed fall in her room, resulted a right parietal hematoma, declined ED eval, no new neurological symptoms noted upon my visit. Needs close supervision/assistance for safety 2/2 to her progression of dementia and increased frailty.

## 2022-06-12 NOTE — Assessment & Plan Note (Signed)
lack of safety awareness,  resides in [facility] for safety, care assistance, on Donepezil for memory. MMSE 22/30 08/22/21 

## 2022-06-13 ENCOUNTER — Encounter: Payer: Self-pay | Admitting: Nurse Practitioner

## 2022-06-13 DIAGNOSIS — D649 Anemia, unspecified: Secondary | ICD-10-CM | POA: Diagnosis not present

## 2022-06-13 DIAGNOSIS — E039 Hypothyroidism, unspecified: Secondary | ICD-10-CM | POA: Diagnosis not present

## 2022-06-13 DIAGNOSIS — E876 Hypokalemia: Secondary | ICD-10-CM | POA: Diagnosis not present

## 2022-06-27 ENCOUNTER — Non-Acute Institutional Stay: Payer: Medicare (Managed Care) | Admitting: Nurse Practitioner

## 2022-06-27 ENCOUNTER — Encounter: Payer: Self-pay | Admitting: Nurse Practitioner

## 2022-06-27 DIAGNOSIS — F0390 Unspecified dementia without behavioral disturbance: Secondary | ICD-10-CM | POA: Diagnosis not present

## 2022-06-27 DIAGNOSIS — R269 Unspecified abnormalities of gait and mobility: Secondary | ICD-10-CM

## 2022-06-27 DIAGNOSIS — E039 Hypothyroidism, unspecified: Secondary | ICD-10-CM

## 2022-06-27 DIAGNOSIS — L309 Dermatitis, unspecified: Secondary | ICD-10-CM

## 2022-06-27 DIAGNOSIS — F339 Major depressive disorder, recurrent, unspecified: Secondary | ICD-10-CM | POA: Diagnosis not present

## 2022-06-27 DIAGNOSIS — R21 Rash and other nonspecific skin eruption: Secondary | ICD-10-CM | POA: Insufficient documentation

## 2022-06-27 DIAGNOSIS — L03312 Cellulitis of back [any part except buttock]: Secondary | ICD-10-CM | POA: Insufficient documentation

## 2022-06-27 DIAGNOSIS — L989 Disorder of the skin and subcutaneous tissue, unspecified: Secondary | ICD-10-CM

## 2022-06-27 NOTE — Assessment & Plan Note (Signed)
uses walker, risk for falling 

## 2022-06-27 NOTE — Assessment & Plan Note (Signed)
Left lower back skin growth, irritated, referral to dermatology.

## 2022-06-27 NOTE — Assessment & Plan Note (Signed)
will

## 2022-06-27 NOTE — Assessment & Plan Note (Signed)
Her mood is stabilizing, but still anxious at times, on Escitalopram, prn Lorazepam, Na 137 05/11/22

## 2022-06-27 NOTE — Assessment & Plan Note (Signed)
lack of safety awareness,  resides in [facility] for safety, care assistance, on Donepezil for memory. MMSE 22/30 08/22/21 

## 2022-06-27 NOTE — Assessment & Plan Note (Signed)
the the left lower back an irritated skin growth with a diffused redness, swelling, warmth area noted. Will treat with Doxycycline '100mg'$  bid x 7 days

## 2022-06-27 NOTE — Progress Notes (Signed)
Location:  [facility] Room Number: AL/821/A Place of Service:  ALF (13) Provider:  Ivone Licht X, NP [name], MD  Patient Care Team: [name], MD as PCP - General (Internal Medicine) Myrakle Wingler X, NP as Nurse Practitioner (Internal Medicine)  Extended Emergency Contact Information Primary Emergency Contact: Yonke,[name] Address: [address]          [place], [place] [address] [name] of [place] Phone: [phone] Mobile Phone: [phone] Relation: Son Secondary Emergency Contact: Zwiebel,Brett Address: [address]          [place], [place] [address] [name] of [place] Phone: [phone] Work Phone: [phone] Mobile Phone: [phone] Relation: Son  Code Status:  DNR Goals of care: Advanced Directive information    06/12/2022   10:51 AM  Advanced Directives  Does Patient Have a Medical Advance Directive? Yes  Type of Paramedic of [place];Living will;Out of facility DNR (pink MOST or yellow form)  Does patient want to make changes to medical advance directive? No - Patient declined  Copy of [place] in Chart? Yes - validated most recent copy scanned in chart (See row information)  Pre-existing out of facility DNR order (yellow form or pink MOST form) Pink Most/Yellow Form available - Physician notified to receive inpatient order;Yellow form placed in chart (order not valid for inpatient use)     Chief Complaint  Patient presents with   Acute Visit    Patient is here for rash on her back    HPI:  Pt is a 88 y.o. female seen today for back rash from the lower nape line to mid back, diffused macular skin changes, the the left lower back an irritated skin growth with a diffused redness, swelling, warmth area noted.   Gait abnormality, uses walker, risk for falling             Dementia, lack of safety awareness,  resides in [facility] for safety, care assistance,  on Donepezil for memory. MMSE 22/30 08/22/21             Her mood is stabilizing, but still anxious at times, on Escitalopram, prn Lorazepam, Na 137 05/11/22             Hypothyroidism, increased Levothyroxine 06/14/22, TSH 6.09 06/13/22, f/u TSH 12 weeks schedule.      Past Medical History:  Diagnosis Date   Eczema    Hypothyroid    Hypothyroidism    Hypothyroidism    Vitamin D deficiency    Past Surgical History:  Procedure Laterality Date   CHOLECYSTECTOMY N/A 06/24/2013   Procedure: LAPAROSCOPIC CHOLECYSTECTOMY WITH INTRAOPERATIVE CHOLANGIOGRAM;  Surgeon: Ralene Ok, MD;  Location: [facility];  Service: General;  Laterality: N/A;   TONSILLECTOMY      No Known Allergies  Outpatient Encounter Medications as of 06/27/2022  Medication Sig   aspirin EC 81 MG tablet Take 81 mg by mouth daily. Swallow whole.   calcium-vitamin D (OSCAL WITH D) 500-200 MG-UNIT tablet Take 1 tablet by mouth 2 (two) times daily.   donepezil (ARICEPT) 5 MG tablet Take 5 mg by mouth at bedtime.   escitalopram (LEXAPRO) 10 MG tablet Take 15 mg by mouth every morning.   levothyroxine (SYNTHROID) 88 MCG tablet Take 88 mcg by mouth daily.   LORazepam (ATIVAN) 0.5 MG tablet Take 0.5 mg by mouth every 8 (eight) hours as needed for anxiety. agitation   melatonin 1 MG TABS tablet  Take 1 mg by mouth at bedtime.   No facility-administered encounter medications on file as of 06/27/2022.    Review of Systems  Constitutional:  Negative for activity change, appetite change and fever.  HENT:  Positive for hearing loss. Negative for congestion and voice change.   Eyes:  Negative for visual disturbance.  Respiratory:  Negative for shortness of breath.   Gastrointestinal:  Negative for abdominal pain and constipation.  Genitourinary:  Negative for dysuria and urgency.  Musculoskeletal:  Positive for gait problem.  Skin:  Positive for rash.  Neurological:  Negative for speech difficulty, weakness and light-headedness.        Memory lapses.   Psychiatric/Behavioral:  Positive for confusion. Negative for sleep disturbance. The patient is nervous/anxious.     Immunization History  Administered Date(s) Administered   Influenza Whole 06/15/2018   Influenza,inj,Quad PF,6+ Mos 06/25/2013   Influenza-Unspecified 06/23/2020, 06/30/2021   Moderna Sars-Covid-2 Vaccination 09/13/2019, 10/11/2019, 07/20/2020, 02/08/2021   Pfizer Covid-19 Vaccine Bivalent Booster 72yr & up 05/31/2021, 04/19/2022   Pneumococcal Conjugate-13 03/11/2014   Pneumococcal Polysaccharide-23 09/11/1986, 09/11/2001, 06/25/2013   Tdap 03/16/2011, 12/22/2021   Unspecified SARS-COV-2 Vaccination 05/31/2021   Zoster Recombinat (Shingrix) 09/12/2007   Pertinent  Health Maintenance Due  Topic Date Due   INFLUENZA VACCINE  04/11/2022   DEXA SCAN  Completed      06/18/2019    7:46 AM 12/22/2021    9:00 AM 01/28/2022    3:26 PM 02/15/2022   11:35 AM 04/07/2022   10:29 AM  Fall Risk  Falls in the past year?    1 1  Was there an injury with Fall?    1 1  Fall Risk Category Calculator    3 3  Fall Risk Category    High High  Patient Fall Risk Level High fall risk High fall risk High fall risk High fall risk High fall risk  Patient at Risk for Falls Due to    History of fall(s) History of fall(s)  Fall risk Follow up    Falls evaluation completed Falls evaluation completed;Education provided   Functional Status Survey:    Vitals:   06/27/22 1328  BP: 137/78  Pulse: 66  Resp: 18  Temp: (!) 97.3 F (36.3 C)  SpO2: 92%  Weight: 163 lb (73.9 kg)  Height: '4\' 8"'$  (1.422 m)   Body mass index is 36.54 kg/m. Physical Exam Vitals and nursing note reviewed.  Constitutional:      Appearance: Normal appearance.  HENT:     Head: Normocephalic and atraumatic.     Nose: No congestion.     Mouth/Throat:     Mouth: Mucous membranes are moist.  Eyes:     Extraocular Movements: Extraocular movements intact.     Conjunctiva/sclera: Conjunctivae  normal.     Pupils: Pupils are equal, round, and reactive to light.  Cardiovascular:     Rate and Rhythm: Normal rate and regular rhythm.     Heart sounds: No murmur heard. Pulmonary:     Effort: Pulmonary effort is normal.     Breath sounds: No rales.  Abdominal:     General: Bowel sounds are normal.     Palpations: Abdomen is soft.     Tenderness: There is no abdominal tenderness.     Hernia: A hernia is present.     Comments: Umbilical hernia.   Musculoskeletal:     Cervical back: Normal range of motion and neck supple.     Right lower leg: No  edema.     Left lower leg: No edema.  Skin:    General: Skin is warm and dry.     Findings: Rash present.     Comments: back rash from the lower nape line to mid back, diffused macular skin changes, the the left lower back an irritated skin growth with a diffused redness, swelling, warmth area noted.    Neurological:     General: No focal deficit present.     Mental Status: She is alert. Mental status is at baseline.     Gait: Gait abnormal.     Comments: Oriented to person, place.   Psychiatric:        Mood and Affect: Mood normal.        Behavior: Behavior normal.     Labs reviewed: Recent Labs    12/22/21 0913 12/27/21 0000 02/16/22 0000 05/11/22 0000  NA 140 137 139 137  K 3.8 4.1 4.2 3.9  CL 109 102 104 104  CO2 23 22 29* 23*  GLUCOSE 111*  --   --   --   BUN '10 10 12 10  '$ CREATININE 0.68 0.8 0.7 0.8  CALCIUM 8.8* 9.3 9.1 8.8   Recent Labs    12/27/21 0000 02/16/22 0000 05/11/22 0000  AST '27 14 15  '$ ALT '14 11 12  '$ ALKPHOS 63 68 66  ALBUMIN 3.6 3.5  3.5 3.5   Recent Labs    12/22/21 0913 12/27/21 0000 02/16/22 0000 05/11/22 0000  WBC 7.5 8.4 7.9 7.6  NEUTROABS 4.4 4,183.00 3,934.00 3,891.00  HGB 16.4* 16.6* 15.6  15.6 16.0  HCT 48.4* 49* 46  --   MCV 98.0  --   --   --   PLT 166 208 187 181   Lab Results  Component Value Date   TSH 0.26 (A) 04/04/2022   Lab Results  Component Value Date    HGBA1C 5.7 06/18/2018   Lab Results  Component Value Date   CHOL 188 01/30/2018   HDL 64 01/30/2018   LDLCALC 106 (H) 01/30/2018   TRIG 89 01/30/2018   CHOLHDL 2.9 01/30/2018    Significant Diagnostic Results in last 30 days:  No results found.  Assessment/Plan Dermatitis will  Gait abnormality uses walker, risk for falling  Dementia without behavioral disturbance (HCC)  lack of safety awareness,  resides in AL [facility] for safety, care assistance, on Donepezil for memory. MMSE 22/30 08/22/21  Depression, recurrent ([place])  Her mood is stabilizing, but still anxious at times, on Escitalopram, prn Lorazepam, Na 137 05/11/22  Hypothyroidism increased Levothyroxine 06/14/22, TSH 6.09 06/13/22, f/u TSH 12 weeks schedule.     Rash of back  rash from the lower nape line to mid back, diffused macular skin changes, c/o itching, will apply 1% Hydrocortisone cream bid x 7 days, also oral Prednisone '10mg'$  qd x 7 days. Observe.   Cellulitis of back (any part except buttock) the the left lower back an irritated skin growth with a diffused redness, swelling, warmth area noted. Will treat with Doxycycline '100mg'$  bid x 7 days  Skin lesion of back Left lower back skin growth, irritated, referral to dermatology.      Family/ staff Communication: plan of care reviewed with the patient and charge nurse.   Labs/tests ordered:  none  Time spend 40 minutes.

## 2022-06-27 NOTE — Assessment & Plan Note (Signed)
increased Levothyroxine 06/14/22, TSH 6.09 06/13/22, f/u TSH 12 weeks schedule.

## 2022-06-27 NOTE — Assessment & Plan Note (Signed)
rash from the lower nape line to mid back, diffused macular skin changes, c/o itching, will apply 1% Hydrocortisone cream bid x 7 days, also oral Prednisone '10mg'$  qd x 7 days. Observe.

## 2022-07-10 DIAGNOSIS — F330 Major depressive disorder, recurrent, mild: Secondary | ICD-10-CM | POA: Diagnosis not present

## 2022-07-10 DIAGNOSIS — F411 Generalized anxiety disorder: Secondary | ICD-10-CM | POA: Diagnosis not present

## 2022-07-10 DIAGNOSIS — F02A0 Dementia in other diseases classified elsewhere, mild, without behavioral disturbance, psychotic disturbance, mood disturbance, and anxiety: Secondary | ICD-10-CM | POA: Diagnosis not present

## 2022-07-11 ENCOUNTER — Non-Acute Institutional Stay: Payer: Medicare (Managed Care) | Admitting: Family Medicine

## 2022-07-11 DIAGNOSIS — E039 Hypothyroidism, unspecified: Secondary | ICD-10-CM

## 2022-07-11 DIAGNOSIS — F0390 Unspecified dementia without behavioral disturbance: Secondary | ICD-10-CM

## 2022-07-11 DIAGNOSIS — R269 Unspecified abnormalities of gait and mobility: Secondary | ICD-10-CM | POA: Diagnosis not present

## 2022-07-11 NOTE — Progress Notes (Signed)
Provider:  Alain Honey, MD Location:      Place of Service:     PCP: [name], MD Patient Care Team: [name], MD as PCP - General (Internal Medicine) Mast, Man X, NP as Nurse Practitioner (Internal Medicine)  Extended Emergency Contact Information Primary Emergency Contact: Poplin,[name] Address: [address]          [place], [place] [address] [name] of [place] Phone: [phone] Mobile Phone: [phone] Relation: Son Secondary Emergency Contact: Hanners,Brett Address: [address]          [place], [place] [address] [name] of [place] Phone: [phone] Work Phone: [phone] Mobile Phone: [phone] Relation: Son  Code Status:  Goals of Care: Advanced Directive information    06/12/2022   10:51 AM  Advanced Directives  Does Patient Have a Medical Advance Directive? Yes  Type of Paramedic of [place];Living will;Out of facility DNR (pink MOST or yellow form)  Does patient want to make changes to medical advance directive? No - Patient declined  Copy of [place] in Chart? Yes - validated most recent copy scanned in chart (See row information)  Pre-existing out of facility DNR order (yellow form or pink MOST form) Pink Most/Yellow Form available - Physician notified to receive inpatient order;Yellow form placed in chart (order not valid for inpatient use)      No chief complaint on file.   HPI: Patient is a 88 y.o. female seen today for medical management of chronic problems including: [place], without behavioral disturbance, gait abnormality, hypothyroidism. I found her in her room today.  She denies any current problems or symptoms.  She answered questions appropriately at home but could not remember her age.  No issues with bowel or bladder function, appetite, or sleep.  She uses a walker for ambulation and there is been no recent falls although she has been seen for a fall  last month.  She seems to function well in this environment assisted living  Past Medical History:  Diagnosis Date   Eczema    Hypothyroid    Hypothyroidism    Hypothyroidism    Vitamin D deficiency    Past Surgical History:  Procedure Laterality Date   CHOLECYSTECTOMY N/A 06/24/2013   Procedure: LAPAROSCOPIC CHOLECYSTECTOMY WITH INTRAOPERATIVE CHOLANGIOGRAM;  Surgeon: Ralene Ok, MD;  Location: [facility];  Service: General;  Laterality: N/A;   TONSILLECTOMY      reports that she has never smoked. She has never used smokeless tobacco. She reports that she does not drink alcohol and does not use drugs. Social History   Socioeconomic History   Marital status: Widowed    Spouse name: Not on file   Number of children: 3   Years of education: College   Highest education level: Not on file  Occupational History   Occupation: Retired  Tobacco Use   Smoking status: Never   Smokeless tobacco: Never  Vaping Use   Vaping Use: Never used  Substance and Sexual Activity   Alcohol use: No    Alcohol/week: 0.0 standard drinks of alcohol   Drug use: No   Sexual activity: Not on file  Other Topics Concern   Not on file  Social History Narrative   Tobacco use, amount per day now: None      Past tobacco use, amount per day: None      How many years did you use tobacco: Never      Alcohol use (drinks per  week): None      Diet: 2 meals a day at [facility]      Do you drink/eat things with caffeine? Coffee      Marital status: Widowed             What year were you married? 1952      Do you live in a house, apartment, assisted living, condo, trailer? Apartment      Is it one or more stories? 1      How many persons live in your home?      Do you have any pets in your home? 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 Yes           How often? Weekly and daily classes. Does walking on her own.      Do you have a living will?  Yes      Do you have a DNR form?   Yes         If not, do you want to discuss one?      Do you have signed POA/HPOA forms? Yes              Social Determinants of Health   Financial Resource Strain: Low Risk  (10/02/2017)   Overall Financial Resource Strain (CARDIA)    Difficulty of Paying Living Expenses: Not hard at all  Food Insecurity: No Food Insecurity (10/02/2017)   Hunger Vital Sign    Worried About Running Out of Food in the Last Year: Never true    Ran Out of Food in the Last Year: Never true  Transportation Needs: No Transportation Needs (10/02/2017)   PRAPARE - Hydrologist (Medical): No    Lack of Transportation (Non-Medical): No  Physical Activity: Sufficiently Active (10/02/2017)   Exercise Vital Sign    Days of Exercise per Week: 7 days    Minutes of Exercise per Session: 30 min  Stress: No Stress Concern Present (10/02/2017)   [place]    Feeling of Stress : Not at all  Social Connections: Moderately Isolated (10/02/2017)   Social Connection and Isolation Panel [NHANES]    Frequency of Communication with Friends and Family: More than three times a week    Frequency of Social Gatherings with Friends and Family: More than three times a week    Attends Religious Services: Never    Marine scientist or Organizations: No    Attends Archivist Meetings: Never    Marital Status: Widowed  Intimate Partner Violence: Not At Risk (10/02/2017)   Humiliation, Afraid, Rape, and Kick questionnaire    Fear of Current or Ex-Partner: No    Emotionally Abused: No    Physically Abused: No    Sexually Abused: No    Functional Status Survey:    Family History  Problem Relation Age of Onset   Multiple myeloma Mother    Prostate cancer Father    Heart disease Father     Health Maintenance  Topic Date Due   Zoster Vaccines- Shingrix (2 of 2) 11/07/2007   INFLUENZA  VACCINE  04/11/2022   Medicare Annual Wellness (AWV)  08/16/2022   TETANUS/TDAP  12/23/2031   Pneumonia Vaccine 22+ Years old  Completed   DEXA SCAN  Completed   COVID-19 Vaccine  Completed   HPV VACCINES  Aged Out    No Known Allergies  Outpatient Encounter Medications as of 07/11/2022  Medication Sig   aspirin EC 81 MG tablet Take 81 mg by mouth daily. Swallow whole.   calcium-vitamin D (OSCAL WITH D) 500-200 MG-UNIT tablet Take 1 tablet by mouth 2 (two) times daily.   donepezil (ARICEPT) 5 MG tablet Take 5 mg by mouth at bedtime.   escitalopram (LEXAPRO) 10 MG tablet Take 15 mg by mouth every morning.   levothyroxine (SYNTHROID) 88 MCG tablet Take 88 mcg by mouth daily.   LORazepam (ATIVAN) 0.5 MG tablet Take 0.5 mg by mouth every 8 (eight) hours as needed for anxiety. agitation   melatonin 1 MG TABS tablet Take 1 mg by mouth at bedtime.   No facility-administered encounter medications on file as of 07/11/2022.    Review of Systems  Constitutional: Negative.   HENT: Negative.    Respiratory: Negative.    Cardiovascular: Negative.   Gastrointestinal: Negative.   Genitourinary: Negative.   Neurological: Negative.   Psychiatric/Behavioral:  Positive for confusion.   All other systems reviewed and are negative.   There were no vitals filed for this visit. There is no height or weight on file to calculate BMI. Physical Exam Vitals and nursing note reviewed.  Constitutional:      Appearance: Normal appearance.  HENT:     Head: Normocephalic.     Mouth/Throat:     Mouth: Mucous membranes are moist.     Pharynx: Oropharynx is clear.  Eyes:     Pupils: Pupils are equal, round, and reactive to light.  Cardiovascular:     Rate and Rhythm: Normal rate and regular rhythm.     Heart sounds: Normal heart sounds.  Pulmonary:     Effort: Pulmonary effort is normal.     Breath sounds: Normal breath sounds.  Abdominal:     General: Bowel sounds are normal.     Palpations:  Abdomen is soft.  Musculoskeletal:        General: Normal range of motion.     Comments: Uses walker for ambulation  Neurological:     General: No focal deficit present.     Mental Status: She is alert and oriented to person, place, and time.  Psychiatric:        Mood and Affect: Mood normal.        Behavior: Behavior normal.     Labs reviewed: Basic Metabolic Panel: Recent Labs    12/22/21 0913 12/27/21 0000 02/16/22 0000 05/11/22 0000  NA 140 137 139 137  K 3.8 4.1 4.2 3.9  CL 109 102 104 104  CO2 23 22 29* 23*  GLUCOSE 111*  --   --   --   BUN _0 CREATININE 0.68 0.8 0.7 0.8  CALCIUM 8.8* 9.3 9.1 8.8   Liver Function Tests: Recent Labs    12/27/21 0000 02/16/22 0000 05/11/22 0000  AST _1 ALT _2 ALKPHOS 63 68 66  ALBUMIN 3.6 3.5  3.5 3.5   No results for input(s): "LIPASE", "AMYLASE" in the last 8760 hours. No results for input(s): "AMMONIA" in the last 8760 hours. CBC: Recent Labs    12/22/21 0913 12/27/21 0000 02/16/22 0000 05/11/22 0000  WBC 7.5 8.4 7.9 7.6  NEUTROABS 4.4 4,183.00 3,934.00 3,891.00  HGB 16.4* 16.6* 15.6  15.6 16.0  HCT 48.4* 49* 46  --   MCV 98.0  --   --   --  PLT 166 208 187 181   Cardiac Enzymes: No results for input(s): "CKTOTAL", "CKMB", "CKMBINDEX", "TROPONINI" in the last 8760 hours. BNP: Invalid input(s): "POCBNP" Lab Results  Component Value Date   HGBA1C 5.7 06/18/2018   Lab Results  Component Value Date   TSH 0.26 (A) 04/04/2022   Lab Results  Component Value Date   VITAMINB12 352 10/28/2014   No results found for: "FOLATE" No results found for: "IRON", "TIBC", "FERRITIN"  Imaging and Procedures obtained prior to SNF admission: CT Head Wo Contrast  Result Date: 01/28/2022 CLINICAL DATA:  88 year old female with fall and head injury. Initial encounter. EXAM: CT HEAD WITHOUT CONTRAST TECHNIQUE: Contiguous axial images were obtained from the base of the skull through the vertex  without intravenous contrast. RADIATION DOSE REDUCTION: This exam was performed according to the departmental dose-optimization program which includes automated exposure control, adjustment of the mA and/or kV according to patient size and/or use of iterative reconstruction technique. COMPARISON:  12/22/2021 CT FINDINGS: Brain: No evidence of acute infarction, hemorrhage, hydrocephalus, extra-axial collection or mass lesion/mass effect. Atrophy and chronic small-vessel white matter ischemic changes again noted. Vascular: Carotid atherosclerotic calcifications are noted. Skull: Normal. Negative for fracture or focal lesion. Sinuses/Orbits: No acute finding. Other: Anterior scalp soft tissue swelling noted. IMPRESSION: 1. No evidence of acute intracranial abnormality. 2. Anterior scalp soft tissue swelling without fracture. 3. Atrophy and chronic small-vessel white matter ischemic changes. Electronically Signed   By: Margarette Canada M.D.   On: 01/28/2022 18:12    Assessment/Plan 1. Dementia without behavioral disturbance ([place])  No behavioral issues functions well.  Continue with Aricept  2. Gait abnormality Uses walker.  Balance seems to be stable as long as walker is present  3. Acquired hypothyroidism Patient on thyroid replacement therapy TSH was slightly low when last checked 3 months ago    Family/ staff Communication:   Labs/tests ordered: per NP  Lillette Boxer. Sabra Heck, [place] [address] [place], [place] Office [phone]

## 2022-07-12 DIAGNOSIS — R1312 Dysphagia, oropharyngeal phase: Secondary | ICD-10-CM | POA: Diagnosis not present

## 2022-07-12 DIAGNOSIS — R41841 Cognitive communication deficit: Secondary | ICD-10-CM | POA: Diagnosis not present

## 2022-08-08 DIAGNOSIS — F02A0 Dementia in other diseases classified elsewhere, mild, without behavioral disturbance, psychotic disturbance, mood disturbance, and anxiety: Secondary | ICD-10-CM | POA: Diagnosis not present

## 2022-08-08 DIAGNOSIS — F411 Generalized anxiety disorder: Secondary | ICD-10-CM | POA: Diagnosis not present

## 2022-08-08 DIAGNOSIS — F330 Major depressive disorder, recurrent, mild: Secondary | ICD-10-CM | POA: Diagnosis not present

## 2022-08-10 ENCOUNTER — Emergency Department (HOSPITAL_BASED_OUTPATIENT_CLINIC_OR_DEPARTMENT_OTHER): Payer: Medicare (Managed Care) | Admitting: Radiology

## 2022-08-10 ENCOUNTER — Emergency Department (HOSPITAL_BASED_OUTPATIENT_CLINIC_OR_DEPARTMENT_OTHER): Payer: Medicare (Managed Care)

## 2022-08-10 ENCOUNTER — Other Ambulatory Visit: Payer: Self-pay

## 2022-08-10 ENCOUNTER — Encounter (HOSPITAL_BASED_OUTPATIENT_CLINIC_OR_DEPARTMENT_OTHER): Payer: Self-pay | Admitting: Emergency Medicine

## 2022-08-10 ENCOUNTER — Emergency Department (HOSPITAL_BASED_OUTPATIENT_CLINIC_OR_DEPARTMENT_OTHER)
Admission: EM | Admit: 2022-08-10 | Discharge: 2022-08-10 | Disposition: A | Discharge status: Home / Self Care | Payer: Medicare (Managed Care) | Attending: Emergency Medicine | Admitting: Emergency Medicine

## 2022-08-10 DIAGNOSIS — Z7982 Long term (current) use of aspirin: Secondary | ICD-10-CM | POA: Diagnosis not present

## 2022-08-10 DIAGNOSIS — E039 Hypothyroidism, unspecified: Secondary | ICD-10-CM | POA: Insufficient documentation

## 2022-08-10 DIAGNOSIS — S0101XA Laceration without foreign body of scalp, initial encounter: Secondary | ICD-10-CM

## 2022-08-10 DIAGNOSIS — S0990XA Unspecified injury of head, initial encounter: Secondary | ICD-10-CM | POA: Diagnosis present

## 2022-08-10 DIAGNOSIS — W19XXXA Unspecified fall, initial encounter: Secondary | ICD-10-CM

## 2022-08-10 DIAGNOSIS — Y92009 Unspecified place in unspecified non-institutional (private) residence as the place of occurrence of the external cause: Secondary | ICD-10-CM | POA: Insufficient documentation

## 2022-08-10 DIAGNOSIS — F0390 Unspecified dementia without behavioral disturbance: Secondary | ICD-10-CM | POA: Diagnosis not present

## 2022-08-10 MED ORDER — LIDOCAINE-EPINEPHRINE (PF) 2 %-1:200000 IJ SOLN
10.0000 mL | Freq: Once | INTRAMUSCULAR | Status: DC
  Filled 2022-08-10: qty 20

## 2022-08-10 NOTE — ED Notes (Signed)
Patient transported to CT 

## 2022-08-10 NOTE — ED Provider Notes (Signed)
[facility] Provider Note   CSN: [id number] Arrival date & time: 08/10/22  8182     History  Chief Complaint  Patient presents with   Lytle Michaels    Lisa Barrera is a 88 y.o. female.  Pt is a 88 yo female with a pmhx significant for dementia, hypothyroidism, and ambulatory dysfunction.  She presents to the ED via EMS with a fall.  Per EMS, it was unwitnessed.  She sustained a lac to the back of her head.  MS is at baseline.  Son said she was walking to a therapy session and fell back wards.       Home Medications Prior to Admission medications   Medication Sig Start Date End Date Taking? Authorizing Provider  aspirin EC 81 MG tablet Take 81 mg by mouth daily. Swallow whole.    [provider]  calcium-vitamin D (OSCAL WITH D) 500-200 MG-UNIT tablet Take 1 tablet by mouth 2 (two) times daily.    [provider]  donepezil (ARICEPT) 5 MG tablet Take 5 mg by mouth at bedtime.    [provider]  escitalopram (LEXAPRO) 10 MG tablet Take 15 mg by mouth every morning.    [provider]  levothyroxine (SYNTHROID) 88 MCG tablet Take 88 mcg by mouth daily.    [provider]  LORazepam (ATIVAN) 0.5 MG tablet Take 0.5 mg by mouth every 8 (eight) hours as needed for anxiety. agitation    [provider]  melatonin 1 MG TABS tablet Take 1 mg by mouth at bedtime.    [provider]      Allergies    Patient has no known allergies.    Review of Systems   Review of Systems  Skin:  Positive for wound.    Physical Exam Updated Vital Signs BP (!) 132/56   Pulse 60   Temp 98.3 F (36.8 C) (Oral)   Resp 18   SpO2 95%  Physical Exam Vitals and nursing note reviewed.  Constitutional:      Appearance: Normal appearance.  HENT:     Head: Normocephalic.      Right Ear: External ear normal.     Left Ear: External ear normal.     Nose: Nose normal.     Mouth/Throat:     Mouth: Mucous  membranes are moist.     Pharynx: Oropharynx is clear.  Eyes:     Extraocular Movements: Extraocular movements intact.     Conjunctiva/sclera: Conjunctivae normal.     Pupils: Pupils are equal, round, and reactive to light.  Neck:     Comments: Pt in c-collar Cardiovascular:     Rate and Rhythm: Normal rate and regular rhythm.     Pulses: Normal pulses.     Heart sounds: Normal heart sounds.  Pulmonary:     Effort: Pulmonary effort is normal.     Breath sounds: Normal breath sounds.  Abdominal:     General: Abdomen is flat. Bowel sounds are normal.     Palpations: Abdomen is soft.  Musculoskeletal:        General: Normal range of motion.  Skin:    General: Skin is warm.     Capillary Refill: Capillary refill takes less than 2 seconds.  Neurological:     Mental Status: She is alert. Mental status is at baseline. She is disoriented.  Psychiatric:        Mood and Affect: Mood normal.        Behavior:  Behavior normal.     ED Results / Procedures / Treatments   Labs (all labs ordered are listed, but only abnormal results are displayed) Labs Reviewed - No data to display   EKG None  Radiology DG Chest 2 View  Result Date: 08/10/2022 CLINICAL DATA:  Fall EXAM: CHEST - 2 VIEW COMPARISON:  Chest x-ray dated June 15, 2021 FINDINGS: Unchanged cardiomegaly. Bibasilar atelectasis. Lungs otherwise clear. No evidence of pneumothorax. Old left-sided rib fractures IMPRESSION: Unchanged cardiomegaly.  Bibasilar atelectasis. Electronically Signed   By: Yetta Glassman M.D.   On: 08/10/2022 10:16   DG Pelvis 1-2 Views  Result Date: 08/10/2022 CLINICAL DATA:  Fall. EXAM: PELVIS - 1-2 VIEW COMPARISON:  None Available. FINDINGS: There is diffuse decreased bone mineralization. Mild bilateral femoroacetabular joint space narrowing. Mild bilateral sacroiliac subchondral sclerosis without joint space narrowing. The pubic symphysis joint space is maintained. No definite acute fracture line is  seen. IMPRESSION: 1. No acute fracture is seen. 2. Mild bilateral femoroacetabular osteoarthritis. Electronically Signed   By: Yvonne Kendall M.D.   On: 08/10/2022 10:13   CT Head Wo Contrast  Result Date: 08/10/2022 CLINICAL DATA:  Head trauma, minor (Age >= 65y); Neck trauma (Age >= 65y). Unwitnessed fall, scalp laceration. EXAM: CT HEAD WITHOUT CONTRAST CT CERVICAL SPINE WITHOUT CONTRAST TECHNIQUE: Multidetector CT imaging of the head and cervical spine was performed following the standard protocol without intravenous contrast. Multiplanar CT image reconstructions of the cervical spine were also generated. RADIATION DOSE REDUCTION: This exam was performed according to the departmental dose-optimization program which includes automated exposure control, adjustment of the mA and/or kV according to patient size and/or use of iterative reconstruction technique. COMPARISON:  None Available. FINDINGS: CT HEAD FINDINGS Brain: Normal anatomic configuration. Parenchymal volume loss is commensurate with the patient's age. Stable moderate periventricular white matter changes are present likely reflecting the sequela of small vessel ischemia. Remote lacunar infarct noted within the right thalamus no abnormal intra or extra-axial mass lesion or fluid collection. No abnormal mass effect or midline shift. No evidence of acute intracranial hemorrhage or infarct. Ventricular size is normal. Cerebellum unremarkable. Vascular: No asymmetric hyperdense vasculature at the skull base. Skull: Intact Sinuses/Orbits: Paranasal sinuses are clear. Orbits are unremarkable. Other: Mastoid air cells and middle ear cavities are clear. Mild left occipital scalp soft tissue swelling. CT CERVICAL SPINE FINDINGS Alignment: 3 mm anterolisthesis C4-5 and 3 mm retrolisthesis C5-6 are likely degenerative in nature. Otherwise normal cervical alignment. Skull base and vertebrae: Craniocervical alignment is normal. The atlantodental interval is not  widened. No acute fracture of the cervical spine. Vertebral body height is preserved. There is ankylosis of the right C4-5 facet joint Soft tissues and spinal canal: No prevertebral fluid or swelling. No visible canal hematoma. Disc levels: There is intervertebral disc space narrowing and endplate remodeling at Y1-7 and C6-7 in keeping with changes of advanced degenerative disc disease. Milder degenerative changes are noted throughout the survey nerve the cervical spine. Prevertebral soft tissues are not thickened on sagittal reformats. No high-grade canal stenosis. Multilevel uncovertebral and facet arthrosis results in moderate to severe bilateral neuroforaminal narrowing at C3-C6., Upper chest: Negative. Other: None IMPRESSION: 1. No acute intracranial abnormality. No calvarial fracture. Mild left occipital scalp soft tissue swelling. 2. No acute fracture or listhesis of the cervical spine. Electronically Signed   By: Fidela Salisbury M.D.   On: 08/10/2022 10:11   CT Cervical Spine Wo Contrast  Result Date: 08/10/2022 CLINICAL DATA:  Head trauma, minor (Age >=  65y); Neck trauma (Age >= 65y). Unwitnessed fall, scalp laceration. EXAM: CT HEAD WITHOUT CONTRAST CT CERVICAL SPINE WITHOUT CONTRAST TECHNIQUE: Multidetector CT imaging of the head and cervical spine was performed following the standard protocol without intravenous contrast. Multiplanar CT image reconstructions of the cervical spine were also generated. RADIATION DOSE REDUCTION: This exam was performed according to the departmental dose-optimization program which includes automated exposure control, adjustment of the mA and/or kV according to patient size and/or use of iterative reconstruction technique. COMPARISON:  None Available. FINDINGS: CT HEAD FINDINGS Brain: Normal anatomic configuration. Parenchymal volume loss is commensurate with the patient's age. Stable moderate periventricular white matter changes are present likely reflecting the sequela  of small vessel ischemia. Remote lacunar infarct noted within the right thalamus no abnormal intra or extra-axial mass lesion or fluid collection. No abnormal mass effect or midline shift. No evidence of acute intracranial hemorrhage or infarct. Ventricular size is normal. Cerebellum unremarkable. Vascular: No asymmetric hyperdense vasculature at the skull base. Skull: Intact Sinuses/Orbits: Paranasal sinuses are clear. Orbits are unremarkable. Other: Mastoid air cells and middle ear cavities are clear. Mild left occipital scalp soft tissue swelling. CT CERVICAL SPINE FINDINGS Alignment: 3 mm anterolisthesis C4-5 and 3 mm retrolisthesis C5-6 are likely degenerative in nature. Otherwise normal cervical alignment. Skull base and vertebrae: Craniocervical alignment is normal. The atlantodental interval is not widened. No acute fracture of the cervical spine. Vertebral body height is preserved. There is ankylosis of the right C4-5 facet joint Soft tissues and spinal canal: No prevertebral fluid or swelling. No visible canal hematoma. Disc levels: There is intervertebral disc space narrowing and endplate remodeling at T5-1 and C6-7 in keeping with changes of advanced degenerative disc disease. Milder degenerative changes are noted throughout the survey nerve the cervical spine. Prevertebral soft tissues are not thickened on sagittal reformats. No high-grade canal stenosis. Multilevel uncovertebral and facet arthrosis results in moderate to severe bilateral neuroforaminal narrowing at C3-C6., Upper chest: Negative. Other: None IMPRESSION: 1. No acute intracranial abnormality. No calvarial fracture. Mild left occipital scalp soft tissue swelling. 2. No acute fracture or listhesis of the cervical spine. Electronically Signed   By: Fidela Salisbury M.D.   On: 08/10/2022 10:11    Procedures .Marland KitchenLaceration Repair  Date/Time: 08/10/2022 10:38 AM  Performed by: Isla Pence, MD Authorized by: Isla Pence, MD    Consent:    Consent obtained:  Emergent situation Anesthesia:    Anesthesia method:  Local infiltration   Local anesthetic:  Lidocaine 2% WITH epi Laceration details:    Location:  Scalp   Scalp location:  Occipital   Length (cm):  1 Exploration:    Hemostasis achieved with:  Epinephrine Treatment:    Area cleansed with:  Povidone-iodine and saline   Amount of cleaning:  Standard   Irrigation solution:  Sterile saline Skin repair:    Repair method:  Staples   Number of staples:  3 Approximation:    Approximation:  Close Repair type:    Repair type:  Simple Post-procedure details:    Procedure completion:  Tolerated well, no immediate complications     Medications Ordered in ED Medications  lidocaine-EPINEPHrine (XYLOCAINE W/EPI) 2 %-1:200000 (PF) injection 10 mL (has no administration in time range)    ED Course/ Medical Decision Making/ A&P                           Medical Decision Making Amount and/or Complexity of Data Reviewed Labs: ordered.  Radiology: ordered.  Risk Prescription drug management.   This patient presents to the ED for concern of fall, this involves an extensive number of treatment options, and is a complaint that carries with it a high risk of complications and morbidity.  The differential diagnosis includes multiple trauma   Co morbidities that complicate the patient evaluation  dementia, hypothyroidism, and ambulatory dysfunction   Additional history obtained:  Additional history obtained from epic chart review External records from outside source obtained and reviewed including EMS report, son  Imaging Studies ordered:  I ordered imaging studies including ct head/c-spine/xray pelvis and chest I independently visualized and interpreted imaging which showed  ct head/c-spine: 1. No acute intracranial abnormality. No calvarial fracture. Mild  left occipital scalp soft tissue swelling.  2. No acute fracture or listhesis of the  cervical spine.  Pelvis: No acute fracture is seen.  2. Mild bilateral femoroacetabular osteoarthritis.   CXR: Unchanged cardiomegaly.  Bibasilar atelectasis.  I agree with the radiologist interpretation   Cardiac Monitoring:  The patient was maintained on a cardiac monitor.  I personally viewed and interpreted the cardiac monitored which showed an underlying rhythm of: nsr   Medicines ordered and prescription drug management:  I have reviewed the patients home medicines and have made adjustments as needed   Test Considered:  ct   Critical Interventions:  ct   Problem List / ED Course:  Scalp lac:  lac stapled.  Staples need to be removed in 7-10 days. Fall:  no other injury other than post scalp lac. Pt is able to ambulate with her walker in her room without any pain.  Pt is stable for d/c.  Return if worse.   Reevaluation:  After the interventions noted above, I reevaluated the patient and found that they have :improved   Social Determinants of Health:  Lives in a snf   Dispostion:  After consideration of the diagnostic results and the patients response to treatment, I feel that the patent would benefit from discharge with outpatient f/u.          Final Clinical Impression(s) / ED Diagnoses Final diagnoses:  Fall, initial encounter  Laceration of scalp, initial encounter    Rx / DC Orders ED Discharge Orders     None         Isla Pence, MD 08/10/22 1102

## 2022-08-10 NOTE — ED Triage Notes (Signed)
Pt here from [place] for a  unwitnessed fall , unknown loc , lac to the back of her head , no thinners bleeding controlled , baseline is alert to self per son

## 2022-08-10 NOTE — ED Notes (Signed)
Pt ambulated with a walker with no complications. Pt remained at 92-96% on room air.

## 2022-08-10 NOTE — ED Notes (Signed)
[facility] called 11:05 for transport to [facility]. ABB(NS)

## 2022-08-10 NOTE — Discharge Instructions (Signed)
Staples need to be removed in 7-10 days.

## 2022-08-11 ENCOUNTER — Non-Acute Institutional Stay: Payer: Medicare (Managed Care) | Admitting: Nurse Practitioner

## 2022-08-11 ENCOUNTER — Encounter: Payer: Self-pay | Admitting: Nurse Practitioner

## 2022-08-11 DIAGNOSIS — F0390 Unspecified dementia without behavioral disturbance: Secondary | ICD-10-CM

## 2022-08-11 DIAGNOSIS — F339 Major depressive disorder, recurrent, unspecified: Secondary | ICD-10-CM

## 2022-08-11 DIAGNOSIS — R269 Unspecified abnormalities of gait and mobility: Secondary | ICD-10-CM

## 2022-08-11 DIAGNOSIS — S0101XD Laceration without foreign body of scalp, subsequent encounter: Secondary | ICD-10-CM | POA: Diagnosis not present

## 2022-08-11 DIAGNOSIS — W19XXXA Unspecified fall, initial encounter: Secondary | ICD-10-CM

## 2022-08-11 DIAGNOSIS — E039 Hypothyroidism, unspecified: Secondary | ICD-10-CM | POA: Diagnosis not present

## 2022-08-11 DIAGNOSIS — W19XXXD Unspecified fall, subsequent encounter: Secondary | ICD-10-CM

## 2022-08-11 LAB — HEPATIC FUNCTION PANEL
ALT: 15 U/L (ref 7–35)
AST: 17 (ref 13–35)
Alkaline Phosphatase: 79 (ref 25–125)
Bilirubin, Total: 1.1

## 2022-08-11 LAB — CBC AND DIFFERENTIAL
HCT: 45 (ref 36–46)
Hemoglobin: 16 (ref 12.0–16.0)
Platelets: 185 10*3/uL (ref 150–400)
WBC: 10

## 2022-08-11 LAB — COMPREHENSIVE METABOLIC PANEL
Albumin: 3.7 (ref 3.5–5.0)
Calcium: 9.4 (ref 8.7–10.7)
Globulin: 2.8
eGFR: 74

## 2022-08-11 LAB — BASIC METABOLIC PANEL
BUN: 14 (ref 4–21)
CO2: 24 — AB (ref 13–22)
Chloride: 105 (ref 99–108)
Creatinine: 0.8 (ref 0.5–1.1)
Glucose: 125
Potassium: 3.9 mEq/L (ref 3.5–5.1)
Sodium: 138 (ref 137–147)

## 2022-08-11 LAB — CBC: RBC: 4.68 (ref 3.87–5.11)

## 2022-08-11 NOTE — Assessment & Plan Note (Signed)
Left occiput, staple intact, no active bleeding or s/s of infection, remove staples in 7-10 days.

## 2022-08-11 NOTE — Assessment & Plan Note (Signed)
increased Levothyroxine 06/14/22, TSH 6.09 06/13/22, f/u TSH 12 wks scheduled

## 2022-08-11 NOTE — Assessment & Plan Note (Signed)
2 falls in the past 48 hours, sustained back of head laceration, staple closure placed in ED. The patient fell again when she was found sitting in the doorway of her room after returned from ED. No acute findings of CT head, cervical spine, X-ray chest, pelvis

## 2022-08-11 NOTE — Assessment & Plan Note (Signed)
still anxious at times, on Escitalopram, prn Lorazepam, Na 137 05/11/22, will update CBC/diff, CMP/eGFR, UA C/S in setting of falling and emotional outburst

## 2022-08-11 NOTE — Assessment & Plan Note (Signed)
progressing, lack of safety awareness,  resides in [facility] for safety, care assistance, on Donepezil for memory. MMSE 22/30 08/22/21

## 2022-08-11 NOTE — Assessment & Plan Note (Signed)
uses walker, risk for falling

## 2022-08-11 NOTE — Progress Notes (Signed)
Location:   [facility] Room Number: [id number] Place of Service:  ALF (13) Provider: Lennie Odor Ashaki Frosch NP  [name], MD  Patient Care Team: [name], MD as PCP - General (Internal Medicine) Renji Berwick X, NP as Nurse Practitioner (Internal Medicine)  Extended Emergency Contact Information Primary Emergency Contact: Reny,[name] Address: [address]          [place], [place] [address] [name] of [place] Phone: [phone] Mobile Phone: [phone] Relation: Son Secondary Emergency Contact: Firebaugh,Brett Address: [address]          [place], [place] [address] [name] of [place] Phone: [phone] Mobile Phone: [phone] Relation: Son  Code Status: DNR Goals of care: Advanced Directive information    06/12/2022   10:51 AM  Advanced Directives  Does Patient Have a Medical Advance Directive? Yes  Type of Paramedic of [place];Living will;Out of facility DNR (pink MOST or yellow form)  Does patient want to make changes to medical advance directive? No - Patient declined  Copy of [place] in Chart? Yes - validated most recent copy scanned in chart (See row information)  Pre-existing out of facility DNR order (yellow form or pink MOST form) Pink Most/Yellow Form available - Physician notified to receive inpatient order;Yellow form placed in chart (order not valid for inpatient use)     Chief Complaint  Patient presents with   Acute Visit    Fall, f/u ED eval    HPI:  Pt is a 88 y.o. female seen today for an acute visit for 2 falls in the past 48 hours, sustained back of head laceration, staple closure placed in ED. The patient fell again when she was found sitting in the doorway of her room after returned from ED. No acute findings of CT head, cervical spine, X-ray chest, pelvis  Gait abnormality, uses walker, risk for falling             Dementia, lack of safety awareness,  resides in AL  [facility] for safety, care assistance, on Donepezil for memory. MMSE 22/30 08/22/21             Her mood is stabilizing, but still anxious at times, on Escitalopram, prn Lorazepam, Na 137 05/11/22             Hypothyroidism, increased Levothyroxine 06/14/22, TSH 6.09 06/13/22, f/u TSH 12 wks scheduled  Past Medical History:  Diagnosis Date   Eczema    Hypothyroid    Hypothyroidism    Hypothyroidism    Vitamin D deficiency    Past Surgical History:  Procedure Laterality Date   CHOLECYSTECTOMY N/A 06/24/2013   Procedure: LAPAROSCOPIC CHOLECYSTECTOMY WITH INTRAOPERATIVE CHOLANGIOGRAM;  Surgeon: Ralene Ok, MD;  Location: [facility];  Service: General;  Laterality: N/A;   TONSILLECTOMY      No Known Allergies  Allergies as of 08/11/2022   No Known Allergies      Medication List        Accurate as of August 11, 2022  3:31 PM. If you have any questions, ask your nurse or doctor.          aspirin EC 81 MG tablet Take 81 mg by mouth daily. Swallow whole.   calcium-vitamin D 500-200 MG-UNIT tablet Commonly known as: OSCAL WITH D Take 1 tablet by mouth 2 (two) times daily.   donepezil 5 MG tablet Commonly known as: ARICEPT Take 5 mg by mouth at bedtime.   escitalopram  10 MG tablet Commonly known as: LEXAPRO Take 15 mg by mouth every morning.   levothyroxine 88 MCG tablet Commonly known as: SYNTHROID Take 88 mcg by mouth daily.   LORazepam 0.5 MG tablet Commonly known as: ATIVAN Take 0.5 mg by mouth every 8 (eight) hours as needed for anxiety. agitation   melatonin 1 MG Tabs tablet Take 1 mg by mouth at bedtime.        Review of Systems  Constitutional:  Negative for activity change, appetite change and fever.  HENT:  Positive for hearing loss. Negative for congestion and voice change.   Eyes:  Negative for visual disturbance.  Respiratory:  Negative for shortness of breath.   Gastrointestinal:  Negative for abdominal pain and constipation.  Genitourinary:         Cannot recall if burning sensation upon urination or increased urinary frequency or there is urinary urgency, denied pain in lower abd or back  Musculoskeletal:  Positive for gait problem.  Skin:  Positive for wound. Negative for color change.  Neurological:  Negative for speech difficulty, weakness and light-headedness.       Memory lapses.   Psychiatric/Behavioral:  Positive for confusion. Negative for sleep disturbance. The patient is nervous/anxious.     Immunization History  Administered Date(s) Administered   Influenza Whole 06/15/2018   Influenza,inj,Quad PF,6+ Mos 06/25/2013   Influenza-Unspecified 06/23/2020, 06/30/2021   Moderna Sars-Covid-2 Vaccination 09/13/2019, 10/11/2019, 07/20/2020, 02/08/2021   Pfizer Covid-19 Vaccine Bivalent Booster 26yr & up 05/31/2021, 04/19/2022   Pneumococcal Conjugate-13 03/11/2014   Pneumococcal Polysaccharide-23 09/11/1986, 09/11/2001, 06/25/2013   Tdap 03/16/2011, 12/22/2021   Unspecified SARS-COV-2 Vaccination 05/31/2021   Zoster Recombinat (Shingrix) 09/12/2007   Pertinent  Health Maintenance Due  Topic Date Due   INFLUENZA VACCINE  04/11/2022   DEXA SCAN  Completed      12/22/2021    9:00 AM 01/28/2022    3:26 PM 02/15/2022   11:35 AM 04/07/2022   10:29 AM 08/10/2022    9:16 AM  Fall Risk  Falls in the past year?   1 1   Was there an injury with Fall?   1 1   Fall Risk Category Calculator   3 3   Fall Risk Category   High High   Patient Fall Risk Level _0   Patient at Risk for Falls Due to   History of fall(s) History of fall(s)   Fall risk Follow up   Falls evaluation completed Falls evaluation completed;Education provided    Functional Status Survey:    Vitals:   08/11/22 1513  BP: 120/68  Pulse: 88  Resp: 20  Temp: (!) 97 F (36.1 C)  SpO2: 92%   There is no height or weight on file to calculate BMI. Physical Exam Vitals and nursing note  reviewed.  Constitutional:      Appearance: Normal appearance.  HENT:     Head: Normocephalic and atraumatic.     Nose: No congestion.     Mouth/Throat:     Mouth: Mucous membranes are moist.  Eyes:     Extraocular Movements: Extraocular movements intact.     Conjunctiva/sclera: Conjunctivae normal.     Pupils: Pupils are equal, round, and reactive to light.  Cardiovascular:     Rate and Rhythm: Normal rate and regular rhythm.     Heart sounds: No murmur heard. Pulmonary:     Effort: Pulmonary effort is normal.  Breath sounds: No rales.  Abdominal:     General: Bowel sounds are normal.     Palpations: Abdomen is soft.     Tenderness: There is no abdominal tenderness.     Hernia: A hernia is present.     Comments: Umbilical hernia.   Musculoskeletal:     Cervical back: Normal range of motion and neck supple.     Right lower leg: No edema.     Left lower leg: No edema.  Skin:    General: Skin is warm and dry.     Findings: Bruising present.     Comments: Left occiput scalp laceration, staple closure intact, no active bleeding or s/s of infection.     Neurological:     General: No focal deficit present.     Mental Status: She is alert. Mental status is at baseline.     Gait: Gait abnormal.     Comments: Oriented to person, place.   Psychiatric:        Mood and Affect: Mood normal.        Behavior: Behavior normal.     Labs reviewed: Recent Labs    12/22/21 0913 12/27/21 0000 02/16/22 0000 05/11/22 0000  NA 140 137 139 137  K 3.8 4.1 4.2 3.9  CL 109 102 104 104  CO2 23 22 29* 23*  GLUCOSE 111*  --   --   --   BUN _0 CREATININE 0.68 0.8 0.7 0.8  CALCIUM 8.8* 9.3 9.1 8.8   Recent Labs    12/27/21 0000 02/16/22 0000 05/11/22 0000  AST _1 ALT _2 ALKPHOS 63 68 66  ALBUMIN 3.6 3.5  3.5 3.5   Recent Labs    12/22/21 0913 12/27/21 0000 02/16/22 0000 05/11/22 0000  WBC 7.5 8.4 7.9 7.6  NEUTROABS 4.4 4,183.00 3,934.00  3,891.00  HGB 16.4* 16.6* 15.6  15.6 16.0  HCT 48.4* 49* 46  --   MCV 98.0  --   --   --   PLT 166 208 187 181   Lab Results  Component Value Date   TSH 0.26 (A) 04/04/2022   Lab Results  Component Value Date   HGBA1C 5.7 06/18/2018   Lab Results  Component Value Date   CHOL 188 01/30/2018   HDL 64 01/30/2018   LDLCALC 106 (H) 01/30/2018   TRIG 89 01/30/2018   CHOLHDL 2.9 01/30/2018    Significant Diagnostic Results in last 30 days:  DG Chest 2 View  Result Date: 08/10/2022 CLINICAL DATA:  Fall EXAM: CHEST - 2 VIEW COMPARISON:  Chest x-ray dated June 15, 2021 FINDINGS: Unchanged cardiomegaly. Bibasilar atelectasis. Lungs otherwise clear. No evidence of pneumothorax. Old left-sided rib fractures IMPRESSION: Unchanged cardiomegaly.  Bibasilar atelectasis. Electronically Signed   By: Yetta Glassman M.D.   On: 08/10/2022 10:16   DG Pelvis 1-2 Views  Result Date: 08/10/2022 CLINICAL DATA:  Fall. EXAM: PELVIS - 1-2 VIEW COMPARISON:  None Available. FINDINGS: There is diffuse decreased bone mineralization. Mild bilateral femoroacetabular joint space narrowing. Mild bilateral sacroiliac subchondral sclerosis without joint space narrowing. The pubic symphysis joint space is maintained. No definite acute fracture line is seen. IMPRESSION: 1. No acute fracture is seen. 2. Mild bilateral femoroacetabular osteoarthritis. Electronically Signed   By: Yvonne Kendall M.D.   On: 08/10/2022 10:13   CT Head Wo Contrast  Result Date: 08/10/2022 CLINICAL DATA:  Head trauma, minor (Age >= 65y); Neck trauma (Age >=  65y). Unwitnessed fall, scalp laceration. EXAM: CT HEAD WITHOUT CONTRAST CT CERVICAL SPINE WITHOUT CONTRAST TECHNIQUE: Multidetector CT imaging of the head and cervical spine was performed following the standard protocol without intravenous contrast. Multiplanar CT image reconstructions of the cervical spine were also generated. RADIATION DOSE REDUCTION: This exam was performed according  to the departmental dose-optimization program which includes automated exposure control, adjustment of the mA and/or kV according to patient size and/or use of iterative reconstruction technique. COMPARISON:  None Available. FINDINGS: CT HEAD FINDINGS Brain: Normal anatomic configuration. Parenchymal volume loss is commensurate with the patient's age. Stable moderate periventricular white matter changes are present likely reflecting the sequela of small vessel ischemia. Remote lacunar infarct noted within the right thalamus no abnormal intra or extra-axial mass lesion or fluid collection. No abnormal mass effect or midline shift. No evidence of acute intracranial hemorrhage or infarct. Ventricular size is normal. Cerebellum unremarkable. Vascular: No asymmetric hyperdense vasculature at the skull base. Skull: Intact Sinuses/Orbits: Paranasal sinuses are clear. Orbits are unremarkable. Other: Mastoid air cells and middle ear cavities are clear. Mild left occipital scalp soft tissue swelling. CT CERVICAL SPINE FINDINGS Alignment: 3 mm anterolisthesis C4-5 and 3 mm retrolisthesis C5-6 are likely degenerative in nature. Otherwise normal cervical alignment. Skull base and vertebrae: Craniocervical alignment is normal. The atlantodental interval is not widened. No acute fracture of the cervical spine. Vertebral body height is preserved. There is ankylosis of the right C4-5 facet joint Soft tissues and spinal canal: No prevertebral fluid or swelling. No visible canal hematoma. Disc levels: There is intervertebral disc space narrowing and endplate remodeling at U1-3 and C6-7 in keeping with changes of advanced degenerative disc disease. Milder degenerative changes are noted throughout the survey nerve the cervical spine. Prevertebral soft tissues are not thickened on sagittal reformats. No high-grade canal stenosis. Multilevel uncovertebral and facet arthrosis results in moderate to severe bilateral neuroforaminal narrowing  at C3-C6., Upper chest: Negative. Other: None IMPRESSION: 1. No acute intracranial abnormality. No calvarial fracture. Mild left occipital scalp soft tissue swelling. 2. No acute fracture or listhesis of the cervical spine. Electronically Signed   By: Fidela Salisbury M.D.   On: 08/10/2022 10:11   CT Cervical Spine Wo Contrast  Result Date: 08/10/2022 CLINICAL DATA:  Head trauma, minor (Age >= 65y); Neck trauma (Age >= 65y). Unwitnessed fall, scalp laceration. EXAM: CT HEAD WITHOUT CONTRAST CT CERVICAL SPINE WITHOUT CONTRAST TECHNIQUE: Multidetector CT imaging of the head and cervical spine was performed following the standard protocol without intravenous contrast. Multiplanar CT image reconstructions of the cervical spine were also generated. RADIATION DOSE REDUCTION: This exam was performed according to the departmental dose-optimization program which includes automated exposure control, adjustment of the mA and/or kV according to patient size and/or use of iterative reconstruction technique. COMPARISON:  None Available. FINDINGS: CT HEAD FINDINGS Brain: Normal anatomic configuration. Parenchymal volume loss is commensurate with the patient's age. Stable moderate periventricular white matter changes are present likely reflecting the sequela of small vessel ischemia. Remote lacunar infarct noted within the right thalamus no abnormal intra or extra-axial mass lesion or fluid collection. No abnormal mass effect or midline shift. No evidence of acute intracranial hemorrhage or infarct. Ventricular size is normal. Cerebellum unremarkable. Vascular: No asymmetric hyperdense vasculature at the skull base. Skull: Intact Sinuses/Orbits: Paranasal sinuses are clear. Orbits are unremarkable. Other: Mastoid air cells and middle ear cavities are clear. Mild left occipital scalp soft tissue swelling. CT CERVICAL SPINE FINDINGS Alignment: 3 mm anterolisthesis C4-5 and 3  mm retrolisthesis C5-6 are likely degenerative in  nature. Otherwise normal cervical alignment. Skull base and vertebrae: Craniocervical alignment is normal. The atlantodental interval is not widened. No acute fracture of the cervical spine. Vertebral body height is preserved. There is ankylosis of the right C4-5 facet joint Soft tissues and spinal canal: No prevertebral fluid or swelling. No visible canal hematoma. Disc levels: There is intervertebral disc space narrowing and endplate remodeling at H8-8 and C6-7 in keeping with changes of advanced degenerative disc disease. Milder degenerative changes are noted throughout the survey nerve the cervical spine. Prevertebral soft tissues are not thickened on sagittal reformats. No high-grade canal stenosis. Multilevel uncovertebral and facet arthrosis results in moderate to severe bilateral neuroforaminal narrowing at C3-C6., Upper chest: Negative. Other: None IMPRESSION: 1. No acute intracranial abnormality. No calvarial fracture. Mild left occipital scalp soft tissue swelling. 2. No acute fracture or listhesis of the cervical spine. Electronically Signed   By: Fidela Salisbury M.D.   On: 08/10/2022 10:11    Assessment/Plan: Fall 2 falls in the past 48 hours, sustained back of head laceration, staple closure placed in ED. The patient fell again when she was found sitting in the doorway of her room after returned from ED. No acute findings of CT head, cervical spine, X-ray chest, pelvis  Scalp laceration Left occiput, staple intact, no active bleeding or s/s of infection, remove staples in 7-10 days.   Depression, recurrent ([place]) still anxious at times, on Escitalopram, prn Lorazepam, Na 137 05/11/22, will update CBC/diff, CMP/eGFR, UA C/S in setting of falling and emotional outburst  Dementia without behavioral disturbance ([place])  progressing, lack of safety awareness,  resides in [facility] for safety, care assistance, on Donepezil for memory. MMSE 22/30 08/22/21  Hypothyroidism  increased Levothyroxine  06/14/22, TSH 6.09 06/13/22, f/u TSH 12 wks scheduled    Family/ staff Communication: plan of care reviewed with the patient and charge nurse.   Labs/tests ordered:  CBC/diff, CMP/eGFR, UA C/S  Time spend 40 minutes.

## 2022-08-14 DIAGNOSIS — R41841 Cognitive communication deficit: Secondary | ICD-10-CM | POA: Diagnosis not present

## 2022-08-14 DIAGNOSIS — R1312 Dysphagia, oropharyngeal phase: Secondary | ICD-10-CM | POA: Diagnosis not present

## 2022-08-31 ENCOUNTER — Encounter: Payer: Self-pay | Admitting: Nurse Practitioner

## 2022-08-31 ENCOUNTER — Non-Acute Institutional Stay: Payer: Medicare (Managed Care) | Admitting: Nurse Practitioner

## 2022-08-31 DIAGNOSIS — F339 Major depressive disorder, recurrent, unspecified: Secondary | ICD-10-CM

## 2022-08-31 DIAGNOSIS — R269 Unspecified abnormalities of gait and mobility: Secondary | ICD-10-CM | POA: Diagnosis not present

## 2022-08-31 DIAGNOSIS — F0390 Unspecified dementia without behavioral disturbance: Secondary | ICD-10-CM

## 2022-08-31 NOTE — Assessment & Plan Note (Signed)
uses walker, risk for falling

## 2022-08-31 NOTE — Assessment & Plan Note (Signed)
Her mood is stabilizing, but still anxious at times, on Escitalopram, prn Lorazepam

## 2022-08-31 NOTE — Assessment & Plan Note (Signed)
lack of safety awareness,  resides in AL [facility] for safety, care assistance, on Donepezil for memory. MMSE 22/30 08/22/21

## 2022-08-31 NOTE — Progress Notes (Signed)
Location:   [facility] Room Number: 539 Place of Service:  ALF (13) Provider: Lennie Odor Daxen Lanum NP  [name], MD  Patient Care Team: [name], MD as PCP - General (Internal Medicine) Azaliyah Kennard X, NP as Nurse Practitioner (Internal Medicine)  Extended Emergency Contact Information Primary Emergency Contact: Rivenburg,[name] Address: [address]          [place], [place] [address] [name] of [place] Phone: [phone] Mobile Phone: [phone] Relation: Son Secondary Emergency Contact: Ram,Brett Address: [address]          [place], [place] [address] [name] of [place] Phone: [phone] Mobile Phone: [phone] Relation: Son  Code Status:  DNR Goals of care: Advanced Directive information    06/12/2022   10:51 AM  Advanced Directives  Does Patient Have a Medical Advance Directive? Yes  Type of Paramedic of [place];Living will;Out of facility DNR (pink MOST or yellow form)  Does patient want to make changes to medical advance directive? No - Patient declined  Copy of [place] in Chart? Yes - validated most recent copy scanned in chart (See row information)  Pre-existing out of facility DNR order (yellow form or pink MOST form) Pink Most/Yellow Form available - Physician notified to receive inpatient order;Yellow form placed in chart (order not valid for inpatient use)     Chief Complaint  Patient presents with   Medical Management of Chronic Issues    HPI:  Pt is a 88 y.o. female seen today for medical management of chronic diseases.      Gait abnormality, uses walker, risk for falling             Dementia, lack of safety awareness,  resides in [facility] for safety, care assistance, on Donepezil for memory. MMSE 22/30 08/22/21             Her mood is stabilizing, but still anxious at times, on Escitalopram, prn Lorazepam             Hypothyroidism, increased Levothyroxine  06/14/22, TSH 6.09 06/13/22, f/u TSH 12 wks scheduled   Past Medical History:  Diagnosis Date   Eczema    Hypothyroid    Hypothyroidism    Hypothyroidism    Vitamin D deficiency    Past Surgical History:  Procedure Laterality Date   CHOLECYSTECTOMY N/A 06/24/2013   Procedure: LAPAROSCOPIC CHOLECYSTECTOMY WITH INTRAOPERATIVE CHOLANGIOGRAM;  Surgeon: Ralene Ok, MD;  Location: [facility];  Service: General;  Laterality: N/A;   TONSILLECTOMY      No Known Allergies  Allergies as of 08/31/2022   No Known Allergies      Medication List        Accurate as of August 31, 2022 11:59 PM. If you have any questions, ask your nurse or doctor.          aspirin EC 81 MG tablet Take 81 mg by mouth daily. Swallow whole.   calcium-vitamin D 500-200 MG-UNIT tablet Commonly known as: OSCAL WITH D Take 1 tablet by mouth 2 (two) times daily.   donepezil 5 MG tablet Commonly known as: ARICEPT Take 5 mg by mouth at bedtime.   escitalopram 10 MG tablet Commonly known as: LEXAPRO Take 15 mg by mouth every morning.   levothyroxine 88 MCG tablet Commonly known as: SYNTHROID Take 88 mcg by mouth daily.   LORazepam 0.5 MG tablet Commonly known as: ATIVAN Take 0.5 mg by mouth every 8 (eight)  hours as needed for anxiety. agitation   melatonin 1 MG Tabs tablet Take 1 mg by mouth at bedtime.        Review of Systems  Constitutional:  Negative for activity change, appetite change and fever.  HENT:  Positive for hearing loss. Negative for congestion and voice change.   Eyes:  Negative for visual disturbance.  Respiratory:  Negative for shortness of breath.   Gastrointestinal:  Negative for abdominal pain and constipation.  Genitourinary:  Negative for dysuria, flank pain and urgency.  Musculoskeletal:  Positive for gait problem.  Skin:  Negative for color change.  Neurological:  Negative for speech difficulty, weakness and light-headedness.       Memory lapses.    Psychiatric/Behavioral:  Positive for confusion. Negative for sleep disturbance. The patient is nervous/anxious.     Immunization History  Administered Date(s) Administered   Fluad Quad(high Dose 65+) 07/03/2022   Influenza Whole 06/15/2018   Influenza,inj,Quad PF,6+ Mos 06/25/2013   Influenza-Unspecified 06/23/2020, 06/30/2021   Moderna Sars-Covid-2 Vaccination 09/13/2019, 10/11/2019, 07/20/2020, 02/08/2021   Pfizer Covid-19 Vaccine Bivalent Booster 94yr & up 05/31/2021, 04/19/2022, 07/13/2022   Pneumococcal Conjugate-13 03/11/2014   Pneumococcal Polysaccharide-23 09/11/1986, 09/11/2001, 06/25/2013   Tdap 03/16/2011, 12/22/2021   Unspecified SARS-COV-2 Vaccination 05/31/2021   Zoster Recombinat (Shingrix) 09/12/2007   Pertinent  Health Maintenance Due  Topic Date Due   INFLUENZA VACCINE  Completed   DEXA SCAN  Completed      12/22/2021    9:00 AM 01/28/2022    3:26 PM 02/15/2022   11:35 AM 04/07/2022   10:29 AM 08/10/2022    9:16 AM  Fall Risk  Falls in the past year?   1 1   Was there an injury with Fall?   1 1   Fall Risk Category Calculator   3 3   Fall Risk Category   High High   Patient Fall Risk Level High fall risk High fall risk High fall risk High fall risk High fall risk  Patient at Risk for Falls Due to   History of fall(s) History of fall(s)   Fall risk Follow up   Falls evaluation completed Falls evaluation completed;Education provided    Functional Status Survey:    Vitals:   08/31/22 1509  BP: 138/82  Pulse: 75  Resp: 19  Temp: (!) 96.6 F (35.9 C)  SpO2: 95%  Weight: 157 lb 3.2 oz (71.3 kg)   Body mass index is 35.24 kg/m. Physical Exam Vitals and nursing note reviewed.  Constitutional:      Appearance: Normal appearance.  HENT:     Head: Normocephalic and atraumatic.     Nose: No congestion.     Mouth/Throat:     Mouth: Mucous membranes are moist.  Eyes:     Extraocular Movements: Extraocular movements intact.     Conjunctiva/sclera:  Conjunctivae normal.     Pupils: Pupils are equal, round, and reactive to light.  Cardiovascular:     Rate and Rhythm: Normal rate and regular rhythm.     Heart sounds: No murmur heard. Pulmonary:     Effort: Pulmonary effort is normal.     Breath sounds: No rales.  Abdominal:     General: Bowel sounds are normal.     Palpations: Abdomen is soft.     Tenderness: There is no abdominal tenderness.     Hernia: A hernia is present.     Comments: Umbilical hernia.   Musculoskeletal:     Cervical back: Normal range  of motion and neck supple.     Right lower leg: No edema.     Left lower leg: No edema.  Skin:    General: Skin is warm and dry.     Comments:     Neurological:     General: No focal deficit present.     Mental Status: She is alert. Mental status is at baseline.     Gait: Gait abnormal.     Comments: Oriented to person, place.   Psychiatric:        Mood and Affect: Mood normal.        Behavior: Behavior normal.     Labs reviewed: Recent Labs    12/22/21 0913 12/27/21 0000 02/16/22 0000 05/11/22 0000  NA 140 137 139 137  K 3.8 4.1 4.2 3.9  CL 109 102 104 104  CO2 23 22 29* 23*  GLUCOSE 111*  --   --   --   BUN '10 10 12 10  '$ CREATININE 0.68 0.8 0.7 0.8  CALCIUM 8.8* 9.3 9.1 8.8   Recent Labs    12/27/21 0000 02/16/22 0000 05/11/22 0000  AST '27 14 15  '$ ALT '14 11 12  '$ ALKPHOS 63 68 66  ALBUMIN 3.6 3.5  3.5 3.5   Recent Labs    12/22/21 0913 12/27/21 0000 02/16/22 0000 05/11/22 0000  WBC 7.5 8.4 7.9 7.6  NEUTROABS 4.4 4,183.00 3,934.00 3,891.00  HGB 16.4* 16.6* 15.6  15.6 16.0  HCT 48.4* 49* 46  --   MCV 98.0  --   --   --   PLT 166 208 187 181   Lab Results  Component Value Date   TSH 0.26 (A) 04/04/2022   Lab Results  Component Value Date   HGBA1C 5.7 06/18/2018   Lab Results  Component Value Date   CHOL 188 01/30/2018   HDL 64 01/30/2018   LDLCALC 106 (H) 01/30/2018   TRIG 89 01/30/2018   CHOLHDL 2.9 01/30/2018    Significant  Diagnostic Results in last 30 days:  DG Chest 2 View  Result Date: 08/10/2022 CLINICAL DATA:  Fall EXAM: CHEST - 2 VIEW COMPARISON:  Chest x-ray dated June 15, 2021 FINDINGS: Unchanged cardiomegaly. Bibasilar atelectasis. Lungs otherwise clear. No evidence of pneumothorax. Old left-sided rib fractures IMPRESSION: Unchanged cardiomegaly.  Bibasilar atelectasis. Electronically Signed   By: Yetta Glassman M.D.   On: 08/10/2022 10:16   DG Pelvis 1-2 Views  Result Date: 08/10/2022 CLINICAL DATA:  Fall. EXAM: PELVIS - 1-2 VIEW COMPARISON:  None Available. FINDINGS: There is diffuse decreased bone mineralization. Mild bilateral femoroacetabular joint space narrowing. Mild bilateral sacroiliac subchondral sclerosis without joint space narrowing. The pubic symphysis joint space is maintained. No definite acute fracture line is seen. IMPRESSION: 1. No acute fracture is seen. 2. Mild bilateral femoroacetabular osteoarthritis. Electronically Signed   By: Yvonne Kendall M.D.   On: 08/10/2022 10:13   CT Head Wo Contrast  Result Date: 08/10/2022 CLINICAL DATA:  Head trauma, minor (Age >= 65y); Neck trauma (Age >= 65y). Unwitnessed fall, scalp laceration. EXAM: CT HEAD WITHOUT CONTRAST CT CERVICAL SPINE WITHOUT CONTRAST TECHNIQUE: Multidetector CT imaging of the head and cervical spine was performed following the standard protocol without intravenous contrast. Multiplanar CT image reconstructions of the cervical spine were also generated. RADIATION DOSE REDUCTION: This exam was performed according to the departmental dose-optimization program which includes automated exposure control, adjustment of the mA and/or kV according to patient size and/or use of iterative reconstruction technique. COMPARISON:  None Available. FINDINGS: CT HEAD FINDINGS Brain: Normal anatomic configuration. Parenchymal volume loss is commensurate with the patient's age. Stable moderate periventricular white matter changes are present likely  reflecting the sequela of small vessel ischemia. Remote lacunar infarct noted within the right thalamus no abnormal intra or extra-axial mass lesion or fluid collection. No abnormal mass effect or midline shift. No evidence of acute intracranial hemorrhage or infarct. Ventricular size is normal. Cerebellum unremarkable. Vascular: No asymmetric hyperdense vasculature at the skull base. Skull: Intact Sinuses/Orbits: Paranasal sinuses are clear. Orbits are unremarkable. Other: Mastoid air cells and middle ear cavities are clear. Mild left occipital scalp soft tissue swelling. CT CERVICAL SPINE FINDINGS Alignment: 3 mm anterolisthesis C4-5 and 3 mm retrolisthesis C5-6 are likely degenerative in nature. Otherwise normal cervical alignment. Skull base and vertebrae: Craniocervical alignment is normal. The atlantodental interval is not widened. No acute fracture of the cervical spine. Vertebral body height is preserved. There is ankylosis of the right C4-5 facet joint Soft tissues and spinal canal: No prevertebral fluid or swelling. No visible canal hematoma. Disc levels: There is intervertebral disc space narrowing and endplate remodeling at W2-9 and C6-7 in keeping with changes of advanced degenerative disc disease. Milder degenerative changes are noted throughout the survey nerve the cervical spine. Prevertebral soft tissues are not thickened on sagittal reformats. No high-grade canal stenosis. Multilevel uncovertebral and facet arthrosis results in moderate to severe bilateral neuroforaminal narrowing at C3-C6., Upper chest: Negative. Other: None IMPRESSION: 1. No acute intracranial abnormality. No calvarial fracture. Mild left occipital scalp soft tissue swelling. 2. No acute fracture or listhesis of the cervical spine. Electronically Signed   By: Fidela [facility] M.D.   On: 08/10/2022 10:11   CT Cervical Spine Wo Contrast  Result Date: 08/10/2022 CLINICAL DATA:  Head trauma, minor (Age >= 65y); Neck trauma (Age >=  65y). Unwitnessed fall, scalp laceration. EXAM: CT HEAD WITHOUT CONTRAST CT CERVICAL SPINE WITHOUT CONTRAST TECHNIQUE: Multidetector CT imaging of the head and cervical spine was performed following the standard protocol without intravenous contrast. Multiplanar CT image reconstructions of the cervical spine were also generated. RADIATION DOSE REDUCTION: This exam was performed according to the departmental dose-optimization program which includes automated exposure control, adjustment of the mA and/or kV according to patient size and/or use of iterative reconstruction technique. COMPARISON:  None Available. FINDINGS: CT HEAD FINDINGS Brain: Normal anatomic configuration. Parenchymal volume loss is commensurate with the patient's age. Stable moderate periventricular white matter changes are present likely reflecting the sequela of small vessel ischemia. Remote lacunar infarct noted within the right thalamus no abnormal intra or extra-axial mass lesion or fluid collection. No abnormal mass effect or midline shift. No evidence of acute intracranial hemorrhage or infarct. Ventricular size is normal. Cerebellum unremarkable. Vascular: No asymmetric hyperdense vasculature at the skull base. Skull: Intact Sinuses/Orbits: Paranasal sinuses are clear. Orbits are unremarkable. Other: Mastoid air cells and middle ear cavities are clear. Mild left occipital scalp soft tissue swelling. CT CERVICAL SPINE FINDINGS Alignment: 3 mm anterolisthesis C4-5 and 3 mm retrolisthesis C5-6 are likely degenerative in nature. Otherwise normal cervical alignment. Skull base and vertebrae: Craniocervical alignment is normal. The atlantodental interval is not widened. No acute fracture of the cervical spine. Vertebral body height is preserved. There is ankylosis of the right C4-5 facet joint Soft tissues and spinal canal: No prevertebral fluid or swelling. No visible canal hematoma. Disc levels: There is intervertebral disc space narrowing and  endplate remodeling at F6-2 and C6-7 in keeping with changes  of advanced degenerative disc disease. Milder degenerative changes are noted throughout the survey nerve the cervical spine. Prevertebral soft tissues are not thickened on sagittal reformats. No high-grade canal stenosis. Multilevel uncovertebral and facet arthrosis results in moderate to severe bilateral neuroforaminal narrowing at C3-C6., Upper chest: Negative. Other: None IMPRESSION: 1. No acute intracranial abnormality. No calvarial fracture. Mild left occipital scalp soft tissue swelling. 2. No acute fracture or listhesis of the cervical spine. Electronically Signed   By: Fidela [facility] M.D.   On: 08/10/2022 10:11    Assessment/Plan  Gait abnormality uses walker, risk for falling  Dementia without behavioral disturbance (HCC) lack of safety awareness,  resides in [place] [facility] for safety, care assistance, on Donepezil for memory. MMSE 22/30 08/22/21  Depression, recurrent ([place]) Her mood is stabilizing, but still anxious at times, on Escitalopram, prn Lorazepam  MMSE  Shingrix   Family/ staff Communication: plan of care reviewed with the patient an charge nurse.   Labs/tests ordered: none  Time spend 40 minutes.

## 2022-09-01 DIAGNOSIS — F330 Major depressive disorder, recurrent, mild: Secondary | ICD-10-CM | POA: Diagnosis not present

## 2022-09-01 DIAGNOSIS — F02A0 Dementia in other diseases classified elsewhere, mild, without behavioral disturbance, psychotic disturbance, mood disturbance, and anxiety: Secondary | ICD-10-CM | POA: Diagnosis not present

## 2022-09-01 DIAGNOSIS — F411 Generalized anxiety disorder: Secondary | ICD-10-CM | POA: Diagnosis not present

## 2022-09-12 ENCOUNTER — Encounter: Payer: Self-pay | Admitting: Nurse Practitioner

## 2022-09-12 ENCOUNTER — Non-Acute Institutional Stay: Payer: Medicare (Managed Care) | Admitting: Nurse Practitioner

## 2022-09-12 DIAGNOSIS — F0390 Unspecified dementia without behavioral disturbance: Secondary | ICD-10-CM

## 2022-09-12 DIAGNOSIS — R269 Unspecified abnormalities of gait and mobility: Secondary | ICD-10-CM

## 2022-09-12 DIAGNOSIS — F339 Major depressive disorder, recurrent, unspecified: Secondary | ICD-10-CM

## 2022-09-12 DIAGNOSIS — J209 Acute bronchitis, unspecified: Secondary | ICD-10-CM | POA: Diagnosis not present

## 2022-09-12 LAB — BASIC METABOLIC PANEL
BUN: 12 (ref 4–21)
CO2: 25 — AB (ref 13–22)
Chloride: 105 (ref 99–108)
Creatinine: 0.8 (ref 0.5–1.1)
Glucose: 156
Potassium: 3.9 mEq/L (ref 3.5–5.1)
Sodium: 141 (ref 137–147)

## 2022-09-12 LAB — COMPREHENSIVE METABOLIC PANEL
Calcium: 8.9 (ref 8.7–10.7)
eGFR: 69

## 2022-09-12 NOTE — Assessment & Plan Note (Signed)
uses walker, risk for falling

## 2022-09-12 NOTE — Assessment & Plan Note (Signed)
Her mood is stabilizing, but still anxious at times, on Escitalopram, prn Lorazepam

## 2022-09-12 NOTE — Assessment & Plan Note (Signed)
increased Levothyroxine 06/14/22, TSH 6.09 06/13/22, f/u TSH 12 wks scheduled

## 2022-09-12 NOTE — Assessment & Plan Note (Signed)
lack of safety awareness,  resides in [place] [facility] for safety, care assistance, on Donepezil for memory. MMSE 22/30 08/22/21

## 2022-09-12 NOTE — Assessment & Plan Note (Addendum)
cough, SOB, CXR 09/10/22 showed mild congestive heart failure, Furosemide '40mg'$  x1 09/10/22, pending BMP/BNP, denied chest pain, no O2 desaturation. Diffused coarse rhonchi appreciated R+L lung fields, no s/s of fluid overload. Will Furosemide '20mg'$ /Kcl '20mg'$  qd x 3 days, Mucinex '600mg'$  bid x 3 days, Doxycycline '100mg'$  bid x 7 days. Observe.  09/12/22 Na 141, K 3.9, Bun 12, creat 0.79, BNP 110

## 2022-09-12 NOTE — Progress Notes (Addendum)
Location:  [facility] Room Number: 448-J Place of Service:  [phone]) Provider:  Milton Lions, MD  Patient Care Team: [name], MD as PCP - General (Internal Medicine) Shannon Kirkendall X, NP as Nurse Practitioner (Internal Medicine)  Extended Emergency Contact Information Primary Emergency Contact: Bagent,[name] Address: [address]          [place], [place] [address] [name] of [place] Phone: [phone] Mobile Phone: [phone] Relation: Son Secondary Emergency Contact: Orantes,Brett Address: [address]          [place], [place] [address] [name] of [place] Phone: [phone] Mobile Phone: [phone] Relation: Son  Code Status:  DNR Goals of care: Advanced Directive information    09/12/2022    1:25 PM  Advanced Directives  Does Patient Have a Medical Advance Directive? Yes  Type of Paramedic of [place];Living will;Out of facility DNR (pink MOST or yellow form)  Does patient want to make changes to medical advance directive? No - Patient declined  Copy of [place] in Chart? Yes - validated most recent copy scanned in chart (See row information)  Pre-existing out of facility DNR order (yellow form or pink MOST form) Pink MOST/Yellow Form most recent copy in chart - Physician notified to receive inpatient order     Chief Complaint  Patient presents with   Acute Visit    SOB    HPI:  Pt is a 89 y.o. female seen today for an acute visit for cough, SOB, CXR 09/10/22 showed mild congestive heart failure, Furosemide '40mg'$  x1 09/10/22, pending BMP/BNP, denied chest pain, no O2 desaturation.   Gait abnormality, uses walker, risk for falling             Dementia, lack of safety awareness,  resides in [facility] for [facility], care assistance, on Donepezil for memory. MMSE 22/30 08/22/21             Her mood is stabilizing, but still anxious at times, on  Escitalopram, prn Lorazepam             Hypothyroidism, increased Levothyroxine 06/14/22, TSH 6.09 06/13/22, f/u TSH 12 wks scheduled     Past Medical History:  Diagnosis Date   Eczema    Hypothyroid    Hypothyroidism    Hypothyroidism    Vitamin D deficiency    Past Surgical History:  Procedure Laterality Date   CHOLECYSTECTOMY N/A 06/24/2013   Procedure: LAPAROSCOPIC CHOLECYSTECTOMY WITH INTRAOPERATIVE CHOLANGIOGRAM;  Surgeon: Ralene Ok, MD;  Location: [place];  Service: General;  Laterality: N/A;   TONSILLECTOMY      No Known Allergies  Outpatient Encounter Medications as of 09/12/2022  Medication Sig   aspirin EC 81 MG tablet Take 81 mg by mouth daily. Swallow whole.   calcium-vitamin D (OSCAL WITH D) 500-200 MG-UNIT tablet Take 1 tablet by mouth 2 (two) times daily.   donepezil (ARICEPT) 5 MG tablet Take 5 mg by mouth at bedtime.   Emollient (AQUAPHOR OINTMENT BODY EX) Apply to Back, Chest, BLE, BUE topically one time a day for Dry Skin;Itching;Rash related to DERMATITIS, UNSPECIFIED (L30.9) Apply Aquaphor Ointment to rashes on back chest BUE &BLE daily   escitalopram (LEXAPRO) 20 MG tablet Take 20 mg by mouth daily.   GUAIFENESIN PO Give 10 ml by mouth every 6 hours as needed for Cough for 2 Days Notify MD is continued cough.   levothyroxine (SYNTHROID) 100 MCG tablet  Take 100 mcg by mouth. one time a day every Sat, Sun   levothyroxine (SYNTHROID) 88 MCG tablet Take 88 mcg by mouth daily. Mon, Tue, Wed, Thu, Fr   LORazepam (ATIVAN) 0.5 MG tablet Take 0.5 mg by mouth every 8 (eight) hours as needed for anxiety. agitation   melatonin 1 MG TABS tablet Take 1 mg by mouth at bedtime.   Sodium Fluoride (PREVIDENT 5000 BOOSTER PLUS) 1.1 % PSTE Place 1 application  onto teeth at bedtime. for oral care USE A PEA SIZE AMOUNT DURING NIGHTTIME OAL CARE. SPIT OUT ANY EXCESS. DO NOT RINSE. DO NOT EAT/DRINK  FOR 30 MINS AFTER   [DISCONTINUED] escitalopram (LEXAPRO) 10 MG tablet Take 15 mg  by mouth every morning.   No facility-administered encounter medications on file as of 09/12/2022.    Review of Systems  Constitutional:  Positive for fatigue. Negative for appetite change and fever.  HENT:  Positive for hearing loss. Negative for congestion and voice change.   Eyes:  Negative for visual disturbance.  Respiratory:  Positive for cough and shortness of breath. Negative for chest tightness and wheezing.   Cardiovascular:  Negative for leg swelling.  Gastrointestinal:  Negative for abdominal pain and constipation.  Genitourinary:  Negative for dysuria, flank pain and urgency.  Musculoskeletal:  Positive for gait problem.  Skin:  Negative for color change.  Neurological:  Negative for speech difficulty, weakness and light-headedness.       Memory lapses.   Psychiatric/Behavioral:  Positive for confusion. Negative for sleep disturbance. The patient is nervous/anxious.     Immunization History  Administered Date(s) Administered   Fluad Quad(high Dose 65+) 07/03/2022   Influenza Whole 06/15/2018   Influenza,inj,Quad PF,6+ Mos 06/25/2013   Influenza-Unspecified 06/23/2020, 06/30/2021   Moderna Sars-Covid-2 Vaccination 09/13/2019, 10/11/2019, 07/20/2020, 02/08/2021   Pfizer Covid-19 Vaccine Bivalent Booster 51yr & up 05/31/2021, 04/19/2022, 07/13/2022   Pneumococcal Conjugate-13 03/11/2014   Pneumococcal Polysaccharide-23 09/11/1986, 09/11/2001, 06/25/2013   Tdap 03/16/2011, 12/22/2021   Unspecified SARS-COV-2 Vaccination 05/31/2021   Zoster Recombinat (Shingrix) 09/12/2007   Pertinent  Health Maintenance Due  Topic Date Due   INFLUENZA VACCINE  Completed   DEXA SCAN  Completed      12/22/2021    9:00 AM 01/28/2022    3:26 PM 02/15/2022   11:35 AM 04/07/2022   10:29 AM 08/10/2022    9:16 AM  Fall Risk  Falls in the past year?   1 1   Was there an injury with Fall?   1 1   Fall Risk Category Calculator   3 3   Fall Risk Category   High High   Patient Fall Risk Level  High fall risk High fall risk High fall risk High fall risk High fall risk  Patient at Risk for Falls Due to   History of fall(s) History of fall(s)   Fall risk Follow up   Falls evaluation completed Falls evaluation completed;Education provided    Functional Status Survey:    Vitals:   09/12/22 1314  BP: 110/64  Pulse: 65  Resp: 18  Temp: (!) 97.2 F (36.2 C)  SpO2: 94%  Weight: 158 lb 6.4 oz (71.8 kg)  Height: '4\' 8"'$  (1.422 m)   Body mass index is 35.51 kg/m. Physical Exam Vitals and nursing note reviewed.  Constitutional:      Comments: Tired easily  HENT:     Head: Normocephalic and atraumatic.     Nose: No congestion.     Mouth/Throat:  Mouth: Mucous membranes are moist.  Eyes:     Extraocular Movements: Extraocular movements intact.     Conjunctiva/sclera: Conjunctivae normal.     Pupils: Pupils are equal, round, and reactive to light.  Cardiovascular:     Rate and Rhythm: Normal rate and regular rhythm.     Heart sounds: No murmur heard. Pulmonary:     Effort: Pulmonary effort is normal.     Breath sounds: Rhonchi and rales present. No wheezing.     Comments: Diffused coarse rhonchi R+L lung fields, bibasilar rales.  Chest:     Chest wall: No tenderness.  Abdominal:     General: Bowel sounds are normal.     Palpations: Abdomen is soft.     Tenderness: There is no abdominal tenderness.     Hernia: A hernia is present.     Comments: Umbilical hernia.   Musculoskeletal:     Cervical back: Normal range of motion and neck supple.     Right lower leg: No edema.     Left lower leg: No edema.  Skin:    General: Skin is warm and dry.     Comments:     Neurological:     General: No focal deficit present.     Mental Status: She is alert. Mental status is at baseline.     Gait: Gait abnormal.     Comments: Oriented to person, place.   Psychiatric:        Mood and Affect: Mood normal.        Behavior: Behavior normal.     Labs reviewed: Recent Labs     12/22/21 0913 12/27/21 0000 02/16/22 0000 05/11/22 0000 08/11/22 0000  NA 140   < > 139 137 138  K 3.8   < > 4.2 3.9 3.9  CL 109   < > 104 104 105  CO2 23   < > 29* 23* 24*  GLUCOSE 111*  --   --   --   --   BUN 10   < > '12 10 14  '$ CREATININE 0.68   < > 0.7 0.8 0.8  CALCIUM 8.8*   < > 9.1 8.8 9.4   < > = values in this interval not displayed.   Recent Labs    02/16/22 0000 05/11/22 0000 08/11/22 0000  AST '14 15 17  '$ ALT '11 12 15  '$ ALKPHOS 68 66 79  ALBUMIN 3.5  3.5 3.5 3.7   Recent Labs    12/22/21 0913 12/27/21 0000 02/16/22 0000 05/11/22 0000 08/11/22 0000  WBC 7.5 8.4 7.9 7.6 10.0  NEUTROABS 4.4 4,183.00 3,934.00 3,891.00  --   HGB 16.4* 16.6* 15.6  15.6 16.0 16.0  HCT 48.4* 49* 46  --  45  MCV 98.0  --   --   --   --   PLT 166 208 187 181 185   Lab Results  Component Value Date   TSH 0.26 (A) 04/04/2022   Lab Results  Component Value Date   HGBA1C 5.7 06/18/2018   Lab Results  Component Value Date   CHOL 188 01/30/2018   HDL 64 01/30/2018   LDLCALC 106 (H) 01/30/2018   TRIG 89 01/30/2018   CHOLHDL 2.9 01/30/2018    Significant Diagnostic Results in last 30 days:  No results found.  Assessment/Plan Acute bronchitis cough, SOB, CXR 09/10/22 showed mild congestive heart failure, Furosemide '40mg'$  x1 09/10/22, pending BMP/BNP, denied chest pain, no O2 desaturation. Diffused coarse rhonchi appreciated R+L lung  fields, no s/s of fluid overload. Will Furosemide '20mg'$ /Kcl '20mg'$  qd x 3 days, Mucinex '600mg'$  bid x 3 days, Doxycycline '100mg'$  bid x 7 days. Observe.  09/12/22 Na 141, K 3.9, Bun 12, creat 0.79, BNP 110  Gait abnormality uses walker, risk for falling  Dementia without behavioral disturbance (HCC) lack of safety awareness,  [facility], care assistance, on Donepezil for memory. MMSE 22/30 08/22/21  Depression, recurrent ([place])  Her mood is stabilizing, but still anxious at times, on Escitalopram, prn  Lorazepam  Hypothyroidism increased Levothyroxine 06/14/22, TSH 6.09 06/13/22, f/u TSH 12 wks scheduled     Family/ staff Communication: plan of care reviewed with the patient and charge nurse.   Labs/tests ordered:  pending BMP, BNP  Time spend 40 minutes.

## 2022-09-19 ENCOUNTER — Encounter: Payer: Self-pay | Admitting: Nurse Practitioner

## 2022-09-19 ENCOUNTER — Non-Acute Institutional Stay: Payer: Medicare (Managed Care) | Admitting: Nurse Practitioner

## 2022-09-19 DIAGNOSIS — Z0000 Encounter for general adult medical examination without abnormal findings: Secondary | ICD-10-CM

## 2022-09-19 NOTE — Progress Notes (Signed)
Subjective:   Lisa Barrera is a 89 y.o. female who presents for Medicare Annual (Subsequent) preventive examination @ [place].      Objective:    Today's Vitals   09/19/22 1142  BP: 112/69  Pulse: 68  Resp: 18  Temp: 97.8 F (36.6 C)  SpO2: 96%  Weight: 158 lb 6.4 oz (71.8 kg)  Height: '4\' 8"'$  (1.422 m)   Body mass index is 35.51 kg/m.     09/19/2022   11:39 AM 09/12/2022    1:25 PM 06/12/2022   10:51 AM 04/07/2022   10:29 AM 02/20/2022   10:11 AM 02/15/2022   11:36 AM 01/30/2022    3:34 PM  Advanced Directives  Does Patient Have a Medical Advance Directive? Yes Yes Yes Yes Yes Yes Yes  Type of Paramedic of [place];Living will;Out of facility DNR (pink MOST or yellow form) [place];Living will;Out of facility DNR (pink MOST or yellow form) [place];Living will;Out of facility DNR (pink MOST or yellow form) [place];Living will;Out of facility DNR (pink MOST or yellow form) [place];Living will;Out of facility DNR (pink MOST or yellow form) [place];Out of facility DNR (pink MOST or yellow form) [place];Out of facility DNR (pink MOST or yellow form)  Does patient want to make changes to medical advance directive? No - Patient declined No - Patient declined No - Patient declined No - Patient declined No - Patient declined No - Patient declined No - Patient declined  Copy of [place] in Chart? Yes - validated most recent copy scanned in chart (See row information) Yes - validated most recent copy scanned in chart (See row information) Yes - validated most recent copy scanned in chart (See row information) Yes - validated most recent copy scanned in chart (See row information) Yes - validated most recent copy scanned in chart (See row information) Yes - validated most recent copy scanned in chart (See row  information) Yes - validated most recent copy scanned in chart (See row information)  Pre-existing out of facility DNR order (yellow form or pink MOST form) Pink MOST/Yellow Form most recent copy in chart - Physician notified to receive inpatient order Pink MOST/Yellow Form most recent copy in chart - Physician notified to receive inpatient order Pink Most/Yellow Form available - Physician notified to receive inpatient order;Yellow form placed in chart (order not valid for inpatient use) Pink MOST/Yellow Form most recent copy in chart - Physician notified to receive inpatient order Pink MOST/Yellow Form most recent copy in chart - Physician notified to receive inpatient order Pink MOST form placed in chart (order not valid for inpatient use);Yellow form placed in chart (order not valid for inpatient use) Pink MOST form placed in chart (order not valid for inpatient use);Yellow form placed in chart (order not valid for inpatient use)    Current Medications (verified) Outpatient Encounter Medications as of 09/19/2022  Medication Sig   aspirin EC 81 MG tablet Take 81 mg by mouth daily. Swallow whole.   calcium-vitamin D (OSCAL WITH D) 500-200 MG-UNIT tablet Take 1 tablet by mouth 2 (two) times daily.   donepezil (ARICEPT) 5 MG tablet Take 5 mg by mouth at bedtime.   Emollient (AQUAPHOR OINTMENT BODY EX) Apply to Back, Chest, BLE, BUE topically one time a day for Dry Skin;Itching;Rash related to DERMATITIS, UNSPECIFIED (L30.9) Apply Aquaphor Ointment to rashes on back chest  BUE &BLE daily   escitalopram (LEXAPRO) 20 MG tablet Take 20 mg by mouth daily.   GUAIFENESIN PO Give 10 ml by mouth every 6 hours as needed for Cough for 2 Days Notify MD is continued cough.   levothyroxine (SYNTHROID) 100 MCG tablet Take 100 mcg by mouth. one time a day every Sat, Sun   levothyroxine (SYNTHROID) 88 MCG tablet Take 88 mcg by mouth daily. Mon, Tue, Wed, Thu, Fr   LORazepam (ATIVAN) 0.5 MG tablet Take 0.5 mg by mouth  every 8 (eight) hours as needed for anxiety. agitation   melatonin 1 MG TABS tablet Take 1 mg by mouth at bedtime.   Sodium Fluoride (PREVIDENT 5000 BOOSTER PLUS) 1.1 % PSTE Place 1 application  onto teeth at bedtime. for oral care USE A PEA SIZE AMOUNT DURING NIGHTTIME OAL CARE. SPIT OUT ANY EXCESS. DO NOT RINSE. DO NOT EAT/DRINK  FOR 30 MINS AFTER   No facility-administered encounter medications on file as of 09/19/2022.    Allergies (verified) Patient has no known allergies.   History: Past Medical History:  Diagnosis Date   Eczema    Hypothyroid    Hypothyroidism    Hypothyroidism    Vitamin D deficiency    Past Surgical History:  Procedure Laterality Date   CHOLECYSTECTOMY N/A 06/24/2013   Procedure: LAPAROSCOPIC CHOLECYSTECTOMY WITH INTRAOPERATIVE CHOLANGIOGRAM;  Surgeon: Ralene Ok, MD;  Location: [facility] OR;  Service: General;  Laterality: N/A;   TONSILLECTOMY     Family History  Problem Relation Age of Onset   Multiple myeloma Mother    Prostate cancer Father    Heart disease Father    Social History   Socioeconomic History   Marital status: Widowed    Spouse name: Not on file   Number of children: 3   Years of education: College   Highest education level: Not on file  Occupational History   Occupation: Retired  Tobacco Use   Smoking status: Never   Smokeless tobacco: Never  Vaping Use   Vaping Use: Never used  Substance and Sexual Activity   Alcohol use: No    Alcohol/week: 0.0 standard drinks of alcohol   Drug use: No   Sexual activity: Not on file  Other Topics Concern   Not on file  Social History Narrative   Tobacco use, amount per day now: None      Past tobacco use, amount per day: None      How many years did you use tobacco: Never      Alcohol use (drinks per week): None      Diet: 2 meals a day at [facility]      Do you drink/eat things with caffeine? Coffee      Marital status: Widowed             What year were you  married? 1952      Do you live in a house, apartment, assisted living, condo, trailer? Apartment      Is it one or more stories? 1      How many persons live in your home?      Do you have any pets in your home? 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 Yes           How often? Weekly and daily classes. Does walking on her own.      Do you have a  living will? Yes      Do you have a DNR form?   Yes         If not, do you want to discuss one?      Do you have signed POA/HPOA forms? Yes              Social Determinants of Health   Financial Resource Strain: Low Risk  (10/02/2017)   Overall Financial Resource Strain (CARDIA)    Difficulty of Paying Living Expenses: Not hard at all  Food Insecurity: No Food Insecurity (10/02/2017)   Hunger Vital Sign    Worried About Running Out of Food in the Last Year: Never true    Ran Out of Food in the Last Year: Never true  Transportation Needs: No Transportation Needs (10/02/2017)   PRAPARE - Hydrologist (Medical): No    Lack of Transportation (Non-Medical): No  Physical Activity: Sufficiently Active (10/02/2017)   Exercise Vital Sign    Days of Exercise per Week: 7 days    Minutes of Exercise per Session: 30 min  Stress: No Stress Concern Present (10/02/2017)   [place]    Feeling of Stress : Not at all  Social Connections: Moderately Isolated (10/02/2017)   Social Connection and Isolation Panel [NHANES]    Frequency of Communication with Friends and Family: More than three times a week    Frequency of Social Gatherings with Friends and Family: More than three times a week    Attends Religious Services: Never    Marine scientist or Organizations: No    Attends Archivist Meetings: Never    Marital Status: Widowed    Tobacco Counseling Counseling given: Not Answered   Clinical  Intake:  Pre-visit preparation completed: Yes  Pain : No/denies pain     BMI - recorded: 35.51 Nutritional Status: BMI > 30  Obese Nutritional Risks: None Diabetes: No  How often do you need to have someone help you when you read instructions, pamphlets, or other written materials from your doctor or pharmacy?: 3 - Sometimes What is the last grade level you completed in school?: high school  Diabetic?no  Interpreter Needed?: No  Information entered by :: Anetra Czerwinski Bretta Bang NP   Activities of Daily Living    09/19/2022    4:26 PM  In your present state of health, do you have any difficulty performing the following activities:  Hearing? 1  Comment hearing aids.  Vision? 0  Difficulty concentrating or making decisions? 1  Walking or climbing stairs? 1  Dressing or bathing? 1  Doing errands, shopping? 1  Preparing Food and eating ? N  Using the Toilet? N  In the past six months, have you accidently leaked urine? Y  Do you have problems with loss of bowel control? N  Managing your Medications? Y  Managing your Finances? Y  Housekeeping or managing your Housekeeping? Y    Patient Care Team: [name], MD as PCP - General (Internal Medicine) Harmoney Sienkiewicz X, NP as Nurse Practitioner (Internal Medicine)  Indicate any recent Medical Services you may have received from other than [facility] providers in the past year (date may be approximate).     Assessment:   This is a routine wellness examination for McBride.  Hearing/Vision screen No results found.  Dietary issues and exercise activities discussed: Current Exercise Habits: The patient does not participate in regular exercise at present,  Exercise limited by: orthopedic condition(s)    Depression Screen    10/02/2017    1:52 PM  PHQ 2/9 Scores  PHQ - 2 Score 0    Fall Risk    09/19/2022   11:39 AM 04/07/2022   10:29 AM 02/15/2022   11:35 AM 04/11/2019    1:09 PM 10/02/2017    1:52 PM  [place] in the past  year? 0 1 1 0 Yes  Comment    [facility] Telephone Survey: data to providers prior to load   Number falls in past yr: 0 '1 1  1  '$ Injury with Fall? 0 1 1  No  Risk for fall due to : No Fall Risks History of fall(s) History of fall(s)    Follow up Falls evaluation completed Falls evaluation completed;Education provided Falls evaluation completed      [place]:  Any stairs in or around the home? Yes  If so, are there any without handrails? No  Home free of loose throw rugs in walkways, pet beds, electrical cords, etc? Yes  Adequate lighting in your home to reduce risk of falls? Yes   ASSISTIVE DEVICES UTILIZED TO PREVENT FALLS:  Life alert? No  Use of a cane, walker or w/c? Yes  Grab bars in the bathroom? Yes  Shower chair or bench in shower? Yes  Elevated toilet seat or a handicapped toilet? Yes   TIMED UP AND GO:  Was the test performed? Yes .  Length of time to ambulate 10 feet: 15 sec.   Gait slow and steady with assistive device  Cognitive Function:    09/05/2022   10:30 AM 08/27/2021    9:31 PM 03/19/2018    9:48 AM 06/29/2017    2:06 PM 06/26/2017   11:20 AM  MMSE - Mini Mental State Exam  Orientation to time 0 '1 5 4 4  '$ Orientation to Place '3 4 4 5 5  '$ Registration '3 3 3 3 3  '$ Attention/ Calculation '5 5 5 4 4  '$ Recall 3 0 0 2 2  Language- name 2 objects 2 2 0 2 2  Language- repeat '1 1 1 1 1  '$ Language- follow 3 step command '3 3 3 1 1  '$ Language- read & follow direction '1 1 1 1 1  '$ Write a sentence '1 1 1 1 1  '$ Copy design 0 1 1 0 0  Total score '22 22 24 24 24        '$ Immunizations Immunization History  Administered Date(s) Administered   Fluad Quad(high Dose 65+) 07/03/2022   Influenza Whole 06/15/2018   Influenza,inj,Quad PF,6+ Mos 06/25/2013   Influenza-Unspecified 06/23/2020, 06/30/2021   Moderna Sars-Covid-2 Vaccination 09/13/2019, 10/11/2019, 07/20/2020, 02/08/2021   Pfizer Covid-19 Vaccine Bivalent Booster 15yr & up  05/31/2021, 04/19/2022, 07/13/2022   Pneumococcal Conjugate-13 03/11/2014   Pneumococcal Polysaccharide-23 09/11/1986, 09/11/2001, 06/25/2013   Tdap 03/16/2011, 12/22/2021   Unspecified SARS-COV-2 Vaccination 05/31/2021   Zoster Recombinat (Shingrix) 09/12/2007, 09/02/2022    TDAP status: Up to date  Flu Vaccine status: Up to date  Pneumococcal vaccine status: Up to date  Covid-19 vaccine status: Information provided on how to obtain vaccines.   Qualifies for Shingles Vaccine? Yes   Zostavax completed Yes   Shingrix Completed?: Yes  Screening Tests Health Maintenance  Topic Date Due   COVID-19 Vaccine (9 - 2023-24 season) 09/07/2022   Medicare Annual Wellness (AWV)  09/20/2023   DTaP/Tdap/Td (3 - Td or Tdap)  12/23/2031   Pneumonia Vaccine 1+ Years old  Completed   INFLUENZA VACCINE  Completed   DEXA SCAN  Completed   Zoster Vaccines- Shingrix  Completed   HPV VACCINES  Aged Out  09/08/21 DEXA t score -1.954  Health Maintenance  Health Maintenance Due  Topic Date Due   COVID-19 Vaccine (9 - 2023-24 season) 09/07/2022    Colorectal cancer screening: No longer required.   Mammogram status: No longer required due to aged out.  Bone Density status: Ordered 09/19/22. Pt provided with contact info and advised to call to schedule appt.  Lung Cancer Screening: (Low Dose CT Chest recommended if Age 94-80 years, 30 pack-year currently smoking OR have quit w/in 15years.) does not qualify.   Lung Cancer Screening Referral: no  Additional Screening:  Hepatitis C Screening: does not qualify;   Vision Screening: Recommended annual ophthalmology exams for early detection of glaucoma and other disorders of the eye. Is the patient up to date with their annual eye exam?  No  Who is the provider or what is the name of the office in which the patient attends annual eye exams? HPOA will provide If pt is not established with a provider, would they like to be referred to a provider to  establish care? No .   Dental Screening: Recommended annual dental exams for proper oral hygiene  Community Resource Referral / Chronic Care Management: CRR required this visit?  No   CCM required this visit?  No      Plan:    Eye exam, if HPOA desires.  I have personally reviewed and noted the following in the patient's chart:   Medical and social history Use of alcohol, tobacco or illicit drugs  Current medications and supplements including opioid prescriptions. Patient is not currently taking opioid prescriptions. Functional ability and status Nutritional status Physical activity Advanced directives List of other physicians Hospitalizations, surgeries, and ER visits in previous 12 months Vitals Screenings to include cognitive, depression, and falls Referrals and appointments  In addition, I have reviewed and discussed with patient certain preventive protocols, quality metrics, and best practice recommendations. A written personalized care plan for preventive services as well as general preventive health recommendations were provided to patient.     Brenson Hartman X Sidnee Gambrill, NP   09/19/2022

## 2022-10-01 ENCOUNTER — Telehealth: Payer: Self-pay | Admitting: Orthopedic Surgery

## 2022-10-01 DIAGNOSIS — I501 Left ventricular failure: Secondary | ICD-10-CM | POA: Diagnosis not present

## 2022-10-01 DIAGNOSIS — J9809 Other diseases of bronchus, not elsewhere classified: Secondary | ICD-10-CM | POA: Diagnosis not present

## 2022-10-01 DIAGNOSIS — I517 Cardiomegaly: Secondary | ICD-10-CM | POA: Diagnosis not present

## 2022-10-01 DIAGNOSIS — R918 Other nonspecific abnormal finding of lung field: Secondary | ICD-10-CM | POA: Diagnosis not present

## 2022-10-01 NOTE — Telephone Encounter (Signed)
[facility] nurse, Jiya, states patient does not feel well. C/o cough and malaise. Rapid covid and flu negative. Oxygen sats > 91% on room air, afebrile. Family would like provider to see her 10/02/2022 and avoid sending her to ED per nursing. Stat CXR, cbc/diff and bmp ordered. Nursing states it is too late to send stat labs out. Advised to have labs drawn first thing 10/02/2022. Advised to call on call provider if any changes in condition.

## 2022-10-02 ENCOUNTER — Non-Acute Institutional Stay: Payer: Medicare (Managed Care) | Admitting: Nurse Practitioner

## 2022-10-02 ENCOUNTER — Encounter: Payer: Self-pay | Admitting: Nurse Practitioner

## 2022-10-02 DIAGNOSIS — I509 Heart failure, unspecified: Secondary | ICD-10-CM | POA: Diagnosis not present

## 2022-10-02 DIAGNOSIS — F0390 Unspecified dementia without behavioral disturbance: Secondary | ICD-10-CM

## 2022-10-02 DIAGNOSIS — R269 Unspecified abnormalities of gait and mobility: Secondary | ICD-10-CM | POA: Diagnosis not present

## 2022-10-02 DIAGNOSIS — J189 Pneumonia, unspecified organism: Secondary | ICD-10-CM

## 2022-10-02 DIAGNOSIS — E039 Hypothyroidism, unspecified: Secondary | ICD-10-CM

## 2022-10-02 DIAGNOSIS — F339 Major depressive disorder, recurrent, unspecified: Secondary | ICD-10-CM

## 2022-10-02 NOTE — Assessment & Plan Note (Addendum)
congestive cough, DOE,  on Mucinex bid x 5 days since 09/29/22, 09/29/22 CXR CHF, 10/01/22 CXR moderate CHF, right mid lung field nodular infiltrate, 10/02/22 wbc 13.6, Na 143, K 4.0, Bun 16, creat 0.58. Doxycycline 142m bid x 2 week, may consider CT chest 4-6 weeks if CXR shows right mid lung field nodular infiltrate again.   10/05/22 Prednisone 236mqd, DuoNeb q8hr x 5 days.  10/31/22 CXR mild CHF, no PNA, mass, or adenopathy, delay CT chest.

## 2022-10-02 NOTE — Assessment & Plan Note (Signed)
uses walker, risk for falling

## 2022-10-02 NOTE — Assessment & Plan Note (Signed)
lack of safety awareness,  resides in [place] [facility] for safety, care assistance, on Donepezil for memory. MMSE 22/30 08/22/21

## 2022-10-02 NOTE — Assessment & Plan Note (Signed)
congestive cough, DOE,  on Mucinex bid x 5 days since 09/29/22, 09/29/22 CXR CHF, 10/01/22 CXR moderate CHF, right mid lung field nodular infiltrate, 10/02/22 wbc 13.6, Na 143, K 4.0, Bun 16, creat 0.58. BNP 110 09/12/22 Will start Furosemide '20mg'$ , Kcl 39mq qd, BNP, BMP one week. Echocardiogram.

## 2022-10-02 NOTE — Progress Notes (Addendum)
Location:   [facility] [place] Room Number: [place] of Service:  ALF (13) Provider: Lennie Odor Vada Swift NP  [name], MD  Patient Care Team: [name], MD as PCP - General (Internal Medicine) Kali Ambler X, NP as Nurse Practitioner (Internal Medicine)  Extended Emergency Contact Information Primary Emergency Contact: Welsch,[name] Address: [address]          [place], [place] [address] [name] of [place] Phone: [phone] Mobile Phone: [phone] Relation: Son Secondary Emergency Contact: Wordell,Brett Address: [address]          [place], [place] [address] [name] of [place] Phone: [phone] Mobile Phone: [phone] Relation: Son  Code Status: DNR Goals of care: Advanced Directive information    09/19/2022   11:39 AM  Advanced Directives  Does Patient Have a Medical Advance Directive? Yes  Type of Paramedic of [place];Living will;Out of facility DNR (pink MOST or yellow form)  Does patient want to make changes to medical advance directive? No - Patient declined  Copy of [place] in Chart? Yes - validated most recent copy scanned in chart (See row information)  Pre-existing out of facility DNR order (yellow form or pink MOST form) Pink MOST/Yellow Form most recent copy in chart - Physician notified to receive inpatient order     Chief Complaint  Patient presents with   Acute Visit    Congestive cough    HPI:  Pt is a 89 y.o. female seen today for an acute visit for congestive cough, DOE,  on Mucinex bid x 5 days since 09/29/22, 09/29/22 CXR CHF, 10/01/22 CXR moderate CHF, right mid lung field nodular infiltrate, 10/02/22 wbc 13.6, Na 143, K 4.0, Bun 16, creat 0.58.   Gait abnormality, uses walker, risk for falling             Dementia, lack of safety awareness,  resides in [facility] for safety, care assistance, on Donepezil for memory. MMSE 22/30 08/22/21             Her mood is  stabilizing, but still anxious at times, on Escitalopram, prn Lorazepam             Hypothyroidism, increased Levothyroxine 06/14/22, TSH 6.09 06/13/22, f/u TSH 12 wks scheduled       Past Medical History:  Diagnosis Date   Eczema    Hypothyroid    Hypothyroidism    Hypothyroidism    Vitamin D deficiency    Past Surgical History:  Procedure Laterality Date   CHOLECYSTECTOMY N/A 06/24/2013   Procedure: LAPAROSCOPIC CHOLECYSTECTOMY WITH INTRAOPERATIVE CHOLANGIOGRAM;  Surgeon: Ralene Ok, MD;  Location: [place];  Service: General;  Laterality: N/A;   TONSILLECTOMY      No Known Allergies  Allergies as of 10/02/2022   No Known Allergies      Medication List        Accurate as of October 02, 2022 11:59 PM. If you have any questions, ask your nurse or doctor.          AQUAPHOR OINTMENT BODY EX Apply to Back, Chest, BLE, BUE topically one time a day for Dry Skin;Itching;Rash related to DERMATITIS, UNSPECIFIED (L30.9) Apply Aquaphor Ointment to rashes on back chest BUE &BLE daily   aspirin EC 81 MG tablet Take 81 mg by mouth daily. Swallow whole.   calcium-vitamin D 500-200 MG-UNIT tablet Commonly known as: OSCAL WITH D Take 1 tablet by mouth 2 (two) times daily.  donepezil 5 MG tablet Commonly known as: ARICEPT Take 5 mg by mouth at bedtime.   DOXYCYCLINE HYCLATE PO Take 100 mg by mouth 2 (two) times daily.   escitalopram 20 MG tablet Commonly known as: LEXAPRO Take 20 mg by mouth daily.   furosemide 20 MG tablet Commonly known as: LASIX Take 20 mg by mouth daily.   furosemide 20 MG tablet Commonly known as: LASIX Take 20 mg by mouth once.   GUAIFENESIN PO Give 10 ml by mouth every 6 hours as needed for Cough for 2 Days Notify MD is continued cough.   levothyroxine 88 MCG tablet Commonly known as: SYNTHROID Take 88 mcg by mouth daily. Mon, Tue, Wed, Thu, Fr   levothyroxine 100 MCG tablet Commonly known as: SYNTHROID Take 100 mcg by mouth. one time  a day every Sat, Sun   LORazepam 0.5 MG tablet Commonly known as: ATIVAN Take 0.5 mg by mouth every 8 (eight) hours as needed for anxiety. agitation   melatonin 1 MG Tabs tablet Take 1 mg by mouth at bedtime.   potassium chloride 10 MEQ CR capsule Commonly known as: MICRO-K Take 10 mEq by mouth daily.   potassium chloride 10 MEQ CR capsule Commonly known as: MICRO-K Take 20 mEq by mouth daily.   PreviDent 5000 Booster Plus 1.1 % Pste Generic drug: Sodium Fluoride Place 1 application  onto teeth at bedtime. for oral care USE A PEA SIZE AMOUNT DURING NIGHTTIME OAL CARE. SPIT OUT ANY EXCESS. DO NOT RINSE. DO NOT EAT/DRINK  FOR 30 MINS AFTER        Review of Systems  Constitutional:  Positive for fatigue. Negative for appetite change and fever.  HENT:  Positive for hearing loss. Negative for congestion and voice change.   Eyes:  Negative for visual disturbance.  Respiratory:  Positive for cough and shortness of breath. Negative for chest tightness and wheezing.   Cardiovascular:  Negative for leg swelling.  Gastrointestinal:  Negative for abdominal pain and constipation.  Genitourinary:  Negative for dysuria, flank pain and urgency.  Musculoskeletal:  Positive for gait problem.  Skin:  Negative for color change.  Neurological:  Negative for speech difficulty, weakness and light-headedness.       Memory lapses.   Psychiatric/Behavioral:  Positive for confusion. Negative for sleep disturbance. The patient is nervous/anxious.     Immunization History  Administered Date(s) Administered   Fluad Quad(high Dose 65+) 07/03/2022   Influenza Whole 06/15/2018   Influenza,inj,Quad PF,6+ Mos 06/25/2013   Influenza-Unspecified 06/23/2020, 06/30/2021   Moderna Sars-Covid-2 Vaccination 09/13/2019, 10/11/2019, 07/20/2020, 02/08/2021   Pfizer Covid-19 Vaccine Bivalent Booster 6yr & up 05/31/2021, 04/19/2022, 07/13/2022   Pneumococcal Conjugate-13 03/11/2014   Pneumococcal  Polysaccharide-23 09/11/1986, 09/11/2001, 06/25/2013   Tdap 03/16/2011, 12/22/2021   Unspecified SARS-COV-2 Vaccination 05/31/2021   Zoster Recombinat (Shingrix) 09/12/2007, 09/02/2022   Pertinent  Health Maintenance Due  Topic Date Due   INFLUENZA VACCINE  Completed   DEXA SCAN  Completed      01/28/2022    3:26 PM 02/15/2022   11:35 AM 04/07/2022   10:29 AM 08/10/2022    9:16 AM 09/19/2022   11:39 AM  Fall Risk  Falls in the past year?  1 1  0  Was there an injury with Fall?  1 1  0  Fall Risk Category Calculator  3 3  0  Fall Risk Category (Retired)  [employer] Low  (RETIRED) Patient Fall Risk Level High fall risk High fall risk High  fall risk High fall risk Low fall risk  Patient at Risk for Falls Due to  History of fall(s) History of fall(s)  No Fall Risks  Fall risk Follow up  Falls evaluation completed Falls evaluation completed;Education provided  Falls evaluation completed   Functional Status Survey:    Vitals:   10/02/22 1506  BP: (!) 140/78  Pulse: 76  Resp: (!) 22  Temp: 97.7 F (36.5 C)  SpO2: 90%  Weight: 158 lb 6.4 oz (71.8 kg)  Height: 4' 8"$  (1.422 m)   Body mass index is 35.51 kg/m. Physical Exam Vitals and nursing note reviewed.  Constitutional:      Comments: Tired easily  HENT:     Head: Normocephalic and atraumatic.     Nose: No congestion.     Mouth/Throat:     Mouth: Mucous membranes are moist.  Eyes:     Extraocular Movements: Extraocular movements intact.     Conjunctiva/sclera: Conjunctivae normal.     Pupils: Pupils are equal, round, and reactive to light.  Cardiovascular:     Rate and Rhythm: Normal rate and regular rhythm.     Heart sounds: No murmur heard. Pulmonary:     Effort: Pulmonary effort is normal.     Breath sounds: Rales present. No wheezing or rhonchi.     Comments: Moist rales posterior R mid to lower lung fields, left base.  Chest:     Chest wall: No tenderness.  Abdominal:     General: Bowel sounds are normal.      Palpations: Abdomen is soft.     Tenderness: There is no abdominal tenderness.     Hernia: A hernia is present.     Comments: Umbilical hernia.   Musculoskeletal:     Cervical back: Normal range of motion and neck supple.     Right lower leg: No edema.     Left lower leg: No edema.  Skin:    General: Skin is warm and dry.     Comments:     Neurological:     General: No focal deficit present.     Mental Status: She is alert. Mental status is at baseline.     Gait: Gait abnormal.     Comments: Oriented to person, place.   Psychiatric:        Mood and Affect: Mood normal.        Behavior: Behavior normal.     Labs reviewed: Recent Labs    12/22/21 0913 12/27/21 0000 02/16/22 0000 05/11/22 0000 08/11/22 0000  NA 140   < > 139 137 138  K 3.8   < > 4.2 3.9 3.9  CL 109   < > 104 104 105  CO2 23   < > 29* 23* 24*  GLUCOSE 111*  --   --   --   --   BUN 10   < > 12 10 14  $ CREATININE 0.68   < > 0.7 0.8 0.8  CALCIUM 8.8*   < > 9.1 8.8 9.4   < > = values in this interval not displayed.   Recent Labs    02/16/22 0000 05/11/22 0000 08/11/22 0000  AST 14 15 17  $ ALT 11 12 15  $ ALKPHOS 68 66 79  ALBUMIN 3.5  3.5 3.5 3.7   Recent Labs    12/22/21 0913 12/27/21 0000 02/16/22 0000 05/11/22 0000 08/11/22 0000  WBC 7.5 8.4 7.9 7.6 10.0  NEUTROABS 4.4 4,183.00 3,934.00 3,891.00  --  HGB 16.4* 16.6* 15.6  15.6 16.0 16.0  HCT 48.4* 49* 46  --  45  MCV 98.0  --   --   --   --   PLT 166 208 187 181 185   Lab Results  Component Value Date   TSH 0.26 (A) 04/04/2022   Lab Results  Component Value Date   HGBA1C 5.7 06/18/2018   Lab Results  Component Value Date   CHOL 188 01/30/2018   HDL 64 01/30/2018   LDLCALC 106 (H) 01/30/2018   TRIG 89 01/30/2018   CHOLHDL 2.9 01/30/2018    Significant Diagnostic Results in last 30 days:  No results found.  Assessment/Plan: CHF (congestive heart failure) (HCC) congestive cough, DOE,  on Mucinex bid x 5 days since  09/29/22, 09/29/22 CXR CHF, 10/01/22 CXR moderate CHF, right mid lung field nodular infiltrate, 10/02/22 wbc 13.6, Na 143, K 4.0, Bun 16, creat 0.58. BNP 110 09/12/22 Will start Furosemide 9m, Kcl 141m qd, BNP, BMP one week. Echocardiogram.   Pneumonia congestive cough, DOE,  on Mucinex bid x 5 days since 09/29/22, 09/29/22 CXR CHF, 10/01/22 CXR moderate CHF, right mid lung field nodular infiltrate, 10/02/22 wbc 13.6, Na 143, K 4.0, Bun 16, creat 0.58. Doxycycline 10019mid x 2 week, may consider CT chest 4-6 weeks if CXR shows right mid lung field nodular infiltrate again.   10/05/22 Prednisone 3m24m, DuoNeb q8hr x 5 days.  10/31/22 CXR mild CHF, no PNA, mass, or adenopathy, delay CT chest.   Gait abnormality  uses walker, risk for falling  Dementia without behavioral disturbance (HCC) lack of safety awareness,  resides in [facility] safety, care assistance, on Donepezil for memory. MMSE 22/30 08/22/21  Depression, recurrent (HCC)[place] mood is stabilizing, but still anxious at times, on Escitalopram, prn Lorazepam  Hypothyroidism  increased Levothyroxine 06/14/22, TSH 6.09 06/13/22, f/u TSH 12 wks scheduled    Family/ staff Communication: plan of care reviewed with the patient and charge nurse.   Labs/tests ordered:  BMP, TSH one week, CXR 4 weeks, may CT chest if CXR still shows R mid lung nodular infiltrate.   Time spend 40 minutes.

## 2022-10-02 NOTE — Assessment & Plan Note (Signed)
Her mood is stabilizing, but still anxious at times, on Escitalopram, prn Lorazepam

## 2022-10-02 NOTE — Assessment & Plan Note (Signed)
increased Levothyroxine 06/14/22, TSH 6.09 06/13/22, f/u TSH 12 wks scheduled

## 2022-10-03 ENCOUNTER — Encounter: Payer: Self-pay | Admitting: Nurse Practitioner

## 2022-10-03 DIAGNOSIS — F02A0 Dementia in other diseases classified elsewhere, mild, without behavioral disturbance, psychotic disturbance, mood disturbance, and anxiety: Secondary | ICD-10-CM | POA: Diagnosis not present

## 2022-10-03 DIAGNOSIS — F411 Generalized anxiety disorder: Secondary | ICD-10-CM | POA: Diagnosis not present

## 2022-10-03 DIAGNOSIS — F330 Major depressive disorder, recurrent, mild: Secondary | ICD-10-CM | POA: Diagnosis not present

## 2022-10-31 DIAGNOSIS — F330 Major depressive disorder, recurrent, mild: Secondary | ICD-10-CM | POA: Diagnosis not present

## 2022-10-31 DIAGNOSIS — F02A0 Dementia in other diseases classified elsewhere, mild, without behavioral disturbance, psychotic disturbance, mood disturbance, and anxiety: Secondary | ICD-10-CM | POA: Diagnosis not present

## 2022-10-31 DIAGNOSIS — F411 Generalized anxiety disorder: Secondary | ICD-10-CM | POA: Diagnosis not present

## 2022-12-22 ENCOUNTER — Encounter: Payer: Self-pay | Admitting: Nurse Practitioner

## 2022-12-22 ENCOUNTER — Non-Acute Institutional Stay: Payer: Medicare (Managed Care) | Admitting: Nurse Practitioner

## 2022-12-22 DIAGNOSIS — R21 Rash and other nonspecific skin eruption: Secondary | ICD-10-CM

## 2022-12-22 DIAGNOSIS — F0390 Unspecified dementia without behavioral disturbance: Secondary | ICD-10-CM

## 2022-12-22 DIAGNOSIS — R269 Unspecified abnormalities of gait and mobility: Secondary | ICD-10-CM

## 2022-12-22 DIAGNOSIS — E039 Hypothyroidism, unspecified: Secondary | ICD-10-CM

## 2022-12-22 DIAGNOSIS — I509 Heart failure, unspecified: Secondary | ICD-10-CM | POA: Diagnosis not present

## 2022-12-22 DIAGNOSIS — F339 Major depressive disorder, recurrent, unspecified: Secondary | ICD-10-CM

## 2022-12-22 NOTE — Assessment & Plan Note (Signed)
uses walker, risk for falling 

## 2022-12-22 NOTE — Assessment & Plan Note (Signed)
neck, upper chest collar line papular rash, slightly rash, no drainage. Highly suspect irritation from collar, will try 1% Hydrocortisone cream bid x 10 days, may repeat x1

## 2022-12-22 NOTE — Assessment & Plan Note (Signed)
lack of safety awareness,  resides in [facility] for safety, care assistance, on Donepezil for memory. MMSE 19/30 11/02/22

## 2022-12-22 NOTE — Assessment & Plan Note (Signed)
on Levothyroxine, TSH 4.69 10/10/22

## 2022-12-22 NOTE — Assessment & Plan Note (Signed)
Her mood is stabilizing, but still anxious at times, on Escitalopram, prn Lorazepam 

## 2022-12-22 NOTE — Assessment & Plan Note (Signed)
09/29/22 CXR CHF, 10/01/22 CXR moderate CHF, on Furosemide daily, BNP 151, Bun/creat 14/0.84 10/10/22. 10/06/22 Echo EF 75%

## 2022-12-22 NOTE — Progress Notes (Signed)
[facility] Room Number: 821-[address] Place of Service:  ALF [phone]) Provider:  Rhyatt Muska Dicie Beam    Patient Care Team: Mahlon Gammon, MD as PCP - General (Internal Medicine) Kao Conry X, NP as Nurse Practitioner (Internal Medicine)  Extended Emergency Contact Information Primary Emergency Contact: Gunia,[name] Address: [address]          [facility] [place], [place] [address] Darden Amber of [place] Home Phone: [phone] Mobile Phone: [phone] Relation: Son Secondary Emergency Contact: Spittler,Brett Address: [address]          [place], [place] [address] Darden Amber of [place] Home Phone: [phone] Mobile Phone: [phone] Relation: Son  Code Status:  DNR Goals of care: Advanced Directive information    12/22/2022   11:20 AM  Advanced Directives  Does Patient Have a Medical Advance Directive? Yes  Type of Estate agent of [place];Living will;Out of facility DNR (pink MOST or yellow form)  Does patient want to make changes to medical advance directive? No - Patient declined  Copy of Healthcare Power of Attorney in Chart? Yes - validated most recent copy scanned in chart (See row information)  Pre-existing out of facility DNR order (yellow form or pink MOST form) Pink MOST/Yellow Form most recent copy in chart - Physician notified to receive inpatient order     Chief Complaint  Patient presents with   Acute Visit    Upper chest rash    HPI:  Pt is a 89 y.o. female seen today for an acute visit for neck, upper chest collar line papular rash, slightly rash, no drainage.    09/29/22 CXR CHF, 10/01/22 CXR moderate CHF, on Furosemide daily, BNP 151, Bun/creat 14/0.84 10/10/22. 10/06/22 Echo EF 75%             Gait abnormality, uses walker, risk for falling             Dementia, lack of safety awareness,  resides in [facility] for safety, care assistance, on Donepezil for memory. MMSE 19/30 11/02/22             Her mood is  stabilizing, but still anxious at times, on Escitalopram, prn Lorazepam             Hypothyroidism, on Levothyroxine, TSH 4.69 10/10/22         Past Medical History:  Diagnosis Date   Eczema    Hypothyroid    Hypothyroidism    Hypothyroidism    Vitamin D deficiency    Past Surgical History:  Procedure Laterality Date   CHOLECYSTECTOMY N/A 06/24/2013   Procedure: LAPAROSCOPIC CHOLECYSTECTOMY WITH INTRAOPERATIVE CHOLANGIOGRAM;  Surgeon: Axel Filler, MD;  Location: [facility];  Service: General;  Laterality: N/A;   TONSILLECTOMY      No Known Allergies  Outpatient Encounter Medications as of 12/22/2022  Medication Sig   aspirin EC 81 MG tablet Take 81 mg by mouth daily. Swallow whole.   calcium-vitamin D (OSCAL WITH D) 500-200 MG-UNIT tablet Take 1 tablet by mouth 2 (two) times daily.   donepezil (ARICEPT) 5 MG tablet Take 5 mg by mouth at bedtime.   Emollient (AQUAPHOR OINTMENT BODY EX) Apply to Back, Chest, BLE, BUE topically one time a day for Dry Skin;Itching;Rash related to DERMATITIS, UNSPECIFIED (L30.9) Apply Aquaphor Ointment to rashes on back chest BUE &BLE daily   escitalopram (LEXAPRO) 20 MG tablet Take 20 mg by mouth daily.   furosemide (LASIX) 20 MG tablet Take 20 mg by mouth  daily.   levothyroxine (SYNTHROID) 100 MCG tablet Take 100 mcg by mouth. one time a day every Sat, Sun   levothyroxine (SYNTHROID) 88 MCG tablet Take 88 mcg by mouth daily. Mon, Tue, Wed, Thu, Fr   LORazepam (ATIVAN) 0.5 MG tablet Take 0.5 mg by mouth every 8 (eight) hours as needed for anxiety. agitation   melatonin 1 MG TABS tablet Take 1 mg by mouth at bedtime.   potassium chloride (MICRO-K) 10 MEQ CR capsule Take 10 mEq by mouth daily.   Sodium Fluoride (PREVIDENT 5000 BOOSTER PLUS) 1.1 % PSTE Place 1 application  onto teeth at bedtime. for oral care USE A PEA SIZE AMOUNT DURING NIGHTTIME OAL CARE. SPIT OUT ANY EXCESS. DO NOT RINSE. DO NOT EAT/DRINK  FOR 30 MINS AFTER   GUAIFENESIN PO Give 10 ml  by mouth every 6 hours as needed for Cough for 2 Days Notify MD is continued cough. (Patient not taking: Reported on 12/22/2022)   [DISCONTINUED] DOXYCYCLINE HYCLATE PO Take 100 mg by mouth 2 (two) times daily.   [DISCONTINUED] furosemide (LASIX) 20 MG tablet Take 20 mg by mouth once.   [DISCONTINUED] potassium chloride (MICRO-K) 10 MEQ CR capsule Take 20 mEq by mouth daily.   No facility-administered encounter medications on file as of 12/22/2022.    Review of Systems  Constitutional:  Negative for appetite change, fatigue and fever.  HENT:  Positive for hearing loss. Negative for congestion and voice change.   Eyes:  Negative for visual disturbance.  Respiratory:  Negative for cough, shortness of breath and wheezing.   Cardiovascular:  Negative for leg swelling.  Gastrointestinal:  Negative for abdominal pain and constipation.  Genitourinary:  Negative for dysuria, flank pain and urgency.  Musculoskeletal:  Positive for gait problem.  Skin:  Positive for rash.  Neurological:  Negative for speech difficulty, weakness and light-headedness.       Memory lapses.   Psychiatric/Behavioral:  Positive for confusion. Negative for sleep disturbance. The patient is nervous/anxious.     Immunization History  Administered Date(s) Administered   Fluad Quad(high Dose 65+) 07/03/2022   Influenza Whole 06/15/2018   Influenza,inj,Quad PF,6+ Mos 06/25/2013   Influenza-Unspecified 06/23/2020, 06/30/2021   Moderna Sars-Covid-2 Vaccination 09/13/2019, 10/11/2019, 07/20/2020, 02/08/2021   Pfizer Covid-19 Vaccine Bivalent Booster 58yrs & up 05/31/2021, 04/19/2022, 07/13/2022   Pneumococcal Conjugate-13 03/11/2014   Pneumococcal Polysaccharide-23 09/11/1986, 09/11/2001, 06/25/2013   Tdap 03/16/2011, 12/22/2021   Unspecified SARS-COV-2 Vaccination 05/31/2021   Zoster Recombinat (Shingrix) 09/12/2007, 09/02/2022   Pertinent  Health Maintenance Due  Topic Date Due   INFLUENZA VACCINE  04/12/2023   DEXA  SCAN  Completed      01/28/2022    3:26 PM 02/15/2022   11:35 AM 04/07/2022   10:29 AM 08/10/2022    9:16 AM 09/19/2022   11:39 AM  Fall Risk  Falls in the past year?  1 1  0  Was there an injury with Fall?  1 1  0  Fall Risk Category Calculator  3 3  0  Fall Risk Category (Retired)  [employer]  Low  (RETIRED) Patient Fall Risk Level High fall risk High fall risk High fall risk High fall risk Low fall risk  Patient at Risk for Falls Due to  History of fall(s) History of fall(s)  No Fall Risks  Fall risk Follow up  Falls evaluation completed Falls evaluation completed;Education provided  Falls evaluation completed   Functional Status Survey:    Vitals:   12/22/22 1109  BP: 126/72  Pulse: 80  Resp: 20  Temp: (!) 97.2 F (36.2 C)  SpO2: 95%  Weight: 153 lb 12.8 oz (69.8 kg)  Height: 4\' 8"  (1.422 m)   Body mass index is 34.48 kg/m. Physical Exam Vitals and nursing note reviewed.  Constitutional:      Appearance: Normal appearance.  HENT:     Head: Normocephalic and atraumatic.     Nose: No congestion.     Mouth/Throat:     Mouth: Mucous membranes are moist.  Eyes:     Extraocular Movements: Extraocular movements intact.     Conjunctiva/sclera: Conjunctivae normal.     Pupils: Pupils are equal, round, and reactive to light.  Cardiovascular:     Rate and Rhythm: Normal rate and regular rhythm.     Heart sounds: No murmur heard. Pulmonary:     Effort: Pulmonary effort is normal.     Breath sounds: No rales.  Abdominal:     General: Bowel sounds are normal.     Palpations: Abdomen is soft.     Tenderness: There is no abdominal tenderness.     Hernia: A hernia is present.     Comments: Umbilical hernia.   Musculoskeletal:     Cervical back: Normal range of motion and neck supple.     Right lower leg: No edema.     Left lower leg: No edema.  Skin:    General: Skin is warm and dry.     Findings: Rash present.     Comments: Red papular rash neck, upper chest, collar  line.     Neurological:     General: No focal deficit present.     Mental Status: She is alert. Mental status is at baseline.     Gait: Gait abnormal.     Comments: Oriented to person, place.   Psychiatric:        Mood and Affect: Mood normal.        Behavior: Behavior normal.     Labs reviewed: Recent Labs    05/11/22 0000 08/11/22 0000 09/12/22 0000  NA 137 138 141  K 3.9 3.9 3.9  CL 104 105 105  CO2 23* 24* 25*  BUN 10 14 12   CREATININE 0.8 0.8 0.8  CALCIUM 8.8 9.4 8.9   Recent Labs    02/16/22 0000 05/11/22 0000 08/11/22 0000  AST 14 15 17   ALT 11 12 15   ALKPHOS 68 66 79  ALBUMIN 3.5  3.5 3.5 3.7   Recent Labs    12/27/21 0000 02/16/22 0000 05/11/22 0000 08/11/22 0000  WBC 8.4 7.9 7.6 10.0  NEUTROABS 4,183.00 3,934.00 3,891.00  --   HGB 16.6* 15.6  15.6 16.0 16.0  HCT 49* 46  --  45  PLT 208 187 181 185   Lab Results  Component Value Date   TSH 0.26 (A) 04/04/2022   Lab Results  Component Value Date   HGBA1C 5.7 06/18/2018   Lab Results  Component Value Date   CHOL 188 01/30/2018   HDL 64 01/30/2018   LDLCALC 106 (H) 01/30/2018   TRIG 89 01/30/2018   CHOLHDL 2.9 01/30/2018    Significant Diagnostic Results in last 30 days:  No results found.  Assessment/Plan Rash neck, upper chest collar line papular rash, slightly rash, no drainage. Highly suspect irritation from collar, will try 1% Hydrocortisone cream bid x 10 days, may repeat x1  CHF (congestive heart failure) (HCC) 09/29/22 CXR CHF, 10/01/22 CXR moderate CHF, on Furosemide daily,  BNP 151, Bun/creat 14/0.84 10/10/22. 10/06/22 Echo EF 75%  Gait abnormality uses walker, risk for falling  Dementia without behavioral disturbance (HCC)  lack of safety awareness,  resides in [facility] for safety, care assistance, on Donepezil for memory. MMSE 19/30 11/02/22  Depression, recurrent (HCC)  Her mood is stabilizing, but still anxious at times, on Escitalopram, prn  Lorazepam  Hypothyroidism  on Levothyroxine, TSH 4.69 10/10/22     Family/ staff Communication: plan of care reviewed with the patient and charge nurse.   Labs/tests ordered:  none  Time spend 40 minutes.

## 2023-01-02 ENCOUNTER — Non-Acute Institutional Stay: Payer: Medicare (Managed Care) | Admitting: Family Medicine

## 2023-01-02 DIAGNOSIS — R269 Unspecified abnormalities of gait and mobility: Secondary | ICD-10-CM

## 2023-01-02 DIAGNOSIS — F0390 Unspecified dementia without behavioral disturbance: Secondary | ICD-10-CM

## 2023-01-02 DIAGNOSIS — E039 Hypothyroidism, unspecified: Secondary | ICD-10-CM | POA: Diagnosis not present

## 2023-01-02 DIAGNOSIS — I509 Heart failure, unspecified: Secondary | ICD-10-CM | POA: Diagnosis not present

## 2023-01-02 NOTE — Progress Notes (Signed)
Provider:  Jacalyn Lefevre, MD Location:      Place of Service:     PCP: Mahlon Gammon, MD Patient Care Team: Mahlon Gammon, MD as PCP - General (Internal Medicine) Mast, Man X, NP as Nurse Practitioner (Internal Medicine)  Extended Emergency Contact Information Primary Emergency Contact: Al,[name] Address: [address]          [facility] [place], [place] [address] Darden Amber of [place] Home Phone: [phone] Mobile Phone: [phone] Relation: Son Secondary Emergency Contact: Haji,Brett Address: [address]          [place], [place] [address] Darden Amber of [place] Home Phone: [phone] Mobile Phone: [phone] Relation: Son  Code Status:  Goals of Care: Advanced Directive information    12/22/2022   11:20 AM  Advanced Directives  Does Patient Have a Medical Advance Directive? Yes  Type of Estate agent of [place];Living will;Out of facility DNR (pink MOST or yellow form)  Does patient want to make changes to medical advance directive? No - Patient declined  Copy of Healthcare Power of Attorney in Chart? Yes - validated most recent copy scanned in chart (See row information)  Pre-existing out of facility DNR order (yellow form or pink MOST form) Pink MOST/Yellow Form most recent copy in chart - Physician notified to receive inpatient order      No chief complaint on file.   HPI: Patient is a 89 y.o. female seen today for medical management of chronic problems including dementia without behavioral, history of heart failure, hypothyroidism.  She currently resides in assisted living and this seems appropriate for her.  Nursing report no issues or problems.  Due to her dementia she is not a very good historian, but does deny no symptoms or complaints today.  She uses a walker for ambulation and falls have decreased.  Weight and appetite are stable.  Past Medical History:  Diagnosis Date   Eczema    Hypothyroid    Hypothyroidism     Hypothyroidism    Vitamin D deficiency    Past Surgical History:  Procedure Laterality Date   CHOLECYSTECTOMY N/A 06/24/2013   Procedure: LAPAROSCOPIC CHOLECYSTECTOMY WITH INTRAOPERATIVE CHOLANGIOGRAM;  Surgeon: Axel Filler, MD;  Location: [facility] OR;  Service: General;  Laterality: N/A;   TONSILLECTOMY      reports that she has never smoked. She has never used smokeless tobacco. She reports that she does not drink alcohol and does not use drugs. Social History   Socioeconomic History   Marital status: Widowed    Spouse name: Not on file   Number of children: 3   Years of education: College   Highest education level: Not on file  Occupational History   Occupation: Retired  Tobacco Use   Smoking status: Never   Smokeless tobacco: Never  Vaping Use   Vaping Use: Never used  Substance and Sexual Activity   Alcohol use: No    Alcohol/week: 0.0 standard drinks of alcohol   Drug use: No   Sexual activity: Not on file  Other Topics Concern   Not on file  Social History Narrative   Tobacco use, amount per day now: None      Past tobacco use, amount per day: None      How many years did you use tobacco: Never      Alcohol use (drinks per week): None      Diet: 2 meals a day at [facility]      Do you drink/eat things  with caffeine? Coffee      Marital status: Widowed             What year were you married? 1952      Do you live in a house, apartment, assisted living, condo, trailer? Apartment      Is it one or more stories? 1      How many persons live in your home?      Do you have any pets in your home? 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 Yes           How often? Weekly and daily classes. Does walking on her own.      Do you have a living will? Yes      Do you have a DNR form?   Yes         If not, do you want to discuss one?      Do you have signed POA/HPOA forms? Yes              Social Determinants  of Health   Financial Resource Strain: Low Risk  (10/02/2017)   Overall Financial Resource Strain (CARDIA)    Difficulty of Paying Living Expenses: Not hard at all  Food Insecurity: No Food Insecurity (10/02/2017)   Hunger Vital Sign    Worried About Running Out of Food in the Last Year: Never true    Ran Out of Food in the Last Year: Never true  Transportation Needs: No Transportation Needs (10/02/2017)   PRAPARE - Administrator, Civil Service (Medical): No    Lack of Transportation (Non-Medical): No  Physical Activity: Sufficiently Active (10/02/2017)   Exercise Vital Sign    Days of Exercise per Week: 7 days    Minutes of Exercise per Session: 30 min  Stress: No Stress Concern Present (10/02/2017)   [employer] of Occupational Health - Occupational Stress Questionnaire    Feeling of Stress : Not at all  Social Connections: Moderately Isolated (10/02/2017)   Social Connection and Isolation Panel [NHANES]    Frequency of Communication with Friends and Family: More than three times a week    Frequency of Social Gatherings with Friends and Family: More than three times a week    Attends Religious Services: Never    Database administrator or Organizations: No    Attends Banker Meetings: Never    Marital Status: Widowed  Intimate Partner Violence: Not At Risk (10/02/2017)   Humiliation, Afraid, Rape, and Kick questionnaire    Fear of Current or Ex-Partner: No    Emotionally Abused: No    Physically Abused: No    Sexually Abused: No    Functional Status Survey:    Family History  Problem Relation Age of Onset   Multiple myeloma Mother    Prostate cancer Father    Heart disease Father     Health Maintenance  Topic Date Due   COVID-19 Vaccine (9 - 2023-24 season) 09/07/2022   INFLUENZA VACCINE  04/12/2023   Medicare Annual Wellness (AWV)  09/20/2023   DTaP/Tdap/Td (3 - Td or Tdap) 12/23/2031   Pneumonia Vaccine 46+ Years old  Completed   DEXA  SCAN  Completed   Zoster Vaccines- Shingrix  Completed   HPV VACCINES  Aged Out    No Known Allergies  Outpatient Encounter Medications as of 01/02/2023  Medication Sig  aspirin EC 81 MG tablet Take 81 mg by mouth daily. Swallow whole.   calcium-vitamin D (OSCAL WITH D) 500-200 MG-UNIT tablet Take 1 tablet by mouth 2 (two) times daily.   donepezil (ARICEPT) 5 MG tablet Take 5 mg by mouth at bedtime.   Emollient (AQUAPHOR OINTMENT BODY EX) Apply to Back, Chest, BLE, BUE topically one time a day for Dry Skin;Itching;Rash related to DERMATITIS, UNSPECIFIED (L30.9) Apply Aquaphor Ointment to rashes on back chest BUE &BLE daily   escitalopram (LEXAPRO) 20 MG tablet Take 20 mg by mouth daily.   furosemide (LASIX) 20 MG tablet Take 20 mg by mouth daily.   GUAIFENESIN PO Give 10 ml by mouth every 6 hours as needed for Cough for 2 Days Notify MD is continued cough. (Patient not taking: Reported on 12/22/2022)   levothyroxine (SYNTHROID) 100 MCG tablet Take 100 mcg by mouth. one time a day every Sat, Sun   levothyroxine (SYNTHROID) 88 MCG tablet Take 88 mcg by mouth daily. Mon, Tue, Wed, Thu, Fr   LORazepam (ATIVAN) 0.5 MG tablet Take 0.5 mg by mouth every 8 (eight) hours as needed for anxiety. agitation   melatonin 1 MG TABS tablet Take 1 mg by mouth at bedtime.   potassium chloride (MICRO-K) 10 MEQ CR capsule Take 10 mEq by mouth daily.   Sodium Fluoride (PREVIDENT 5000 BOOSTER PLUS) 1.1 % PSTE Place 1 application  onto teeth at bedtime. for oral care USE A PEA SIZE AMOUNT DURING NIGHTTIME OAL CARE. SPIT OUT ANY EXCESS. DO NOT RINSE. DO NOT EAT/DRINK  FOR 30 MINS AFTER   No facility-administered encounter medications on file as of 01/02/2023.    Review of Systems  Unable to perform ROS: Dementia    There were no vitals filed for this visit. There is no height or weight on file to calculate BMI. Physical Exam Vitals and nursing note reviewed.  Constitutional:      Appearance: Normal  appearance.  HENT:     Mouth/Throat:     Mouth: Mucous membranes are moist.     Pharynx: Oropharynx is clear.  Cardiovascular:     Rate and Rhythm: Normal rate and regular rhythm.  Pulmonary:     Effort: Pulmonary effort is normal.     Breath sounds: Normal breath sounds.  Abdominal:     General: Bowel sounds are normal.     Palpations: Abdomen is soft.  Musculoskeletal:     Comments: Uses walker for ambulation  Neurological:     General: No focal deficit present.     Mental Status: She is alert.     Comments: Oriented to self     Labs reviewed: Basic Metabolic Panel: Recent Labs    05/11/22 0000 08/11/22 0000 09/12/22 0000  NA 137 138 141  K 3.9 3.9 3.9  CL 104 105 105  CO2 23* 24* 25*  BUN CREATININE 0.8 0.8 0.8  CALCIUM 8.8 9.4 8.9   Liver Function Tests: Recent Labs    02/16/22 0000 05/11/22 0000 08/11/22 0000  AST ALT ALKPHOS 68 66 79  ALBUMIN 3.5  3.5 3.5 3.7   No results for input(s): "LIPASE", "AMYLASE" in the last 8760 hours. No results for input(s): "AMMONIA" in the last 8760 hours. CBC: Recent Labs    02/16/22 0000 05/11/22 0000 08/11/22 0000  WBC 7.9 7.6 10.0  NEUTROABS 3,934.00 3,891.00  --   HGB 15.6  15.6 16.0 16.0  HCT  46  --  45  PLT 187 181 185   Cardiac Enzymes: No results for input(s): "CKTOTAL", "CKMB", "CKMBINDEX", "TROPONINI" in the last 8760 hours. BNP: Invalid input(s): "POCBNP" Lab Results  Component Value Date   HGBA1C 5.7 06/18/2018   Lab Results  Component Value Date   TSH 0.26 (A) 04/04/2022   Lab Results  Component Value Date   VITAMINB12 352 10/28/2014   No results found for: "FOLATE" No results found for: "IRON", "TIBC", "FERRITIN"  Imaging and Procedures obtained prior to SNF admission: DG Chest 2 View  Result Date: 08/10/2022 CLINICAL DATA:  Fall EXAM: CHEST - 2 VIEW COMPARISON:  Chest x-ray dated June 15, 2021 FINDINGS: Unchanged cardiomegaly. Bibasilar  atelectasis. Lungs otherwise clear. No evidence of pneumothorax. Old left-sided rib fractures IMPRESSION: Unchanged cardiomegaly.  Bibasilar atelectasis. Electronically Signed   By: Allegra Lai M.D.   On: 08/10/2022 10:16   DG Pelvis 1-2 Views  Result Date: 08/10/2022 CLINICAL DATA:  Fall. EXAM: PELVIS - 1-2 VIEW COMPARISON:  None Available. FINDINGS: There is diffuse decreased bone mineralization. Mild bilateral femoroacetabular joint space narrowing. Mild bilateral sacroiliac subchondral sclerosis without joint space narrowing. The pubic symphysis joint space is maintained. No definite acute fracture line is seen. IMPRESSION: 1. No acute fracture is seen. 2. Mild bilateral femoroacetabular osteoarthritis. Electronically Signed   By: Neita Garnet M.D.   On: 08/10/2022 10:13   CT Head Wo Contrast  Result Date: 08/10/2022 CLINICAL DATA:  Head trauma, minor (Age >= 65y); Neck trauma (Age >= 65y). Unwitnessed fall, scalp laceration. EXAM: CT HEAD WITHOUT CONTRAST CT CERVICAL SPINE WITHOUT CONTRAST TECHNIQUE: Multidetector CT imaging of the head and cervical spine was performed following the standard protocol without intravenous contrast. Multiplanar CT image reconstructions of the cervical spine were also generated. RADIATION DOSE REDUCTION: This exam was performed according to the departmental dose-optimization program which includes automated exposure control, adjustment of the mA and/or kV according to patient size and/or use of iterative reconstruction technique. COMPARISON:  None Available. FINDINGS: CT HEAD FINDINGS Brain: Normal anatomic configuration. Parenchymal volume loss is commensurate with the patient's age. Stable moderate periventricular white matter changes are present likely reflecting the sequela of small vessel ischemia. Remote lacunar infarct noted within the right thalamus no abnormal intra or extra-axial mass lesion or fluid collection. No abnormal mass effect or midline shift. No  evidence of acute intracranial hemorrhage or infarct. Ventricular size is normal. Cerebellum unremarkable. Vascular: No asymmetric hyperdense vasculature at the skull base. Skull: Intact Sinuses/Orbits: Paranasal sinuses are clear. Orbits are unremarkable. Other: Mastoid air cells and middle ear cavities are clear. Mild left occipital scalp soft tissue swelling. CT CERVICAL SPINE FINDINGS Alignment: 3 mm anterolisthesis C4-5 and 3 mm retrolisthesis C5-6 are likely degenerative in nature. Otherwise normal cervical alignment. Skull base and vertebrae: Craniocervical alignment is normal. The atlantodental interval is not widened. No acute fracture of the cervical spine. Vertebral body height is preserved. There is ankylosis of the right C4-5 facet joint Soft tissues and spinal canal: No prevertebral fluid or swelling. No visible canal hematoma. Disc levels: There is intervertebral disc space narrowing and endplate remodeling at C5-6 and C6-7 in keeping with changes of advanced degenerative disc disease. Milder degenerative changes are noted throughout the survey nerve the cervical spine. Prevertebral soft tissues are not thickened on sagittal reformats. No high-grade canal stenosis. Multilevel uncovertebral and facet arthrosis results in moderate to severe bilateral neuroforaminal narrowing at C3-C6., Upper chest: Negative. Other: None IMPRESSION: 1. No acute intracranial  abnormality. No calvarial fracture. Mild left occipital scalp soft tissue swelling. 2. No acute fracture or listhesis of the cervical spine. Electronically Signed   By: Helyn Numbers M.D.   On: 08/10/2022 10:11   CT Cervical Spine Wo Contrast  Result Date: 08/10/2022 CLINICAL DATA:  Head trauma, minor (Age >= 65y); Neck trauma (Age >= 65y). Unwitnessed fall, scalp laceration. EXAM: CT HEAD WITHOUT CONTRAST CT CERVICAL SPINE WITHOUT CONTRAST TECHNIQUE: Multidetector CT imaging of the head and cervical spine was performed following the standard  protocol without intravenous contrast. Multiplanar CT image reconstructions of the cervical spine were also generated. RADIATION DOSE REDUCTION: This exam was performed according to the departmental dose-optimization program which includes automated exposure control, adjustment of the mA and/or kV according to patient size and/or use of iterative reconstruction technique. COMPARISON:  None Available. FINDINGS: CT HEAD FINDINGS Brain: Normal anatomic configuration. Parenchymal volume loss is commensurate with the patient's age. Stable moderate periventricular white matter changes are present likely reflecting the sequela of small vessel ischemia. Remote lacunar infarct noted within the right thalamus no abnormal intra or extra-axial mass lesion or fluid collection. No abnormal mass effect or midline shift. No evidence of acute intracranial hemorrhage or infarct. Ventricular size is normal. Cerebellum unremarkable. Vascular: No asymmetric hyperdense vasculature at the skull base. Skull: Intact Sinuses/Orbits: Paranasal sinuses are clear. Orbits are unremarkable. Other: Mastoid air cells and middle ear cavities are clear. Mild left occipital scalp soft tissue swelling. CT CERVICAL SPINE FINDINGS Alignment: 3 mm anterolisthesis C4-5 and 3 mm retrolisthesis C5-6 are likely degenerative in nature. Otherwise normal cervical alignment. Skull base and vertebrae: Craniocervical alignment is normal. The atlantodental interval is not widened. No acute fracture of the cervical spine. Vertebral body height is preserved. There is ankylosis of the right C4-5 facet joint Soft tissues and spinal canal: No prevertebral fluid or swelling. No visible canal hematoma. Disc levels: There is intervertebral disc space narrowing and endplate remodeling at C5-6 and C6-7 in keeping with changes of advanced degenerative disc disease. Milder degenerative changes are noted throughout the survey nerve the cervical spine. Prevertebral soft tissues  are not thickened on sagittal reformats. No high-grade canal stenosis. Multilevel uncovertebral and facet arthrosis results in moderate to severe bilateral neuroforaminal narrowing at C3-C6., Upper chest: Negative. Other: None IMPRESSION: 1. No acute intracranial abnormality. No calvarial fracture. Mild left occipital scalp soft tissue swelling. 2. No acute fracture or listhesis of the cervical spine. Electronically Signed   By: Helyn Numbers M.D.   On: 08/10/2022 10:11    Assessment/Plan 1. Congestive heart failure, unspecified HF chronicity, unspecified heart failure type No shortness of breath or edema noted; on furosemide and potassium supplement  2. Dementia without behavioral disturbance Significant changes since last visit.  Continues on donepezil.  Last MMSE 22 over 31-1/2 years ago  3. Gait abnormality 's have improved.  Patient does use walker  4. Acquired hypothyroid Adjustments have been made to thyroid replacement.  Most recent TSH showed over replacement    Family/ staff Communication:   Labs/tests ordered: no  Bertram Millard. Hyacinth Meeker, MD [facility] [address] [place], [place] [address] Office [phone]

## 2023-01-05 DIAGNOSIS — F02A0 Dementia in other diseases classified elsewhere, mild, without behavioral disturbance, psychotic disturbance, mood disturbance, and anxiety: Secondary | ICD-10-CM | POA: Diagnosis not present

## 2023-01-05 DIAGNOSIS — F411 Generalized anxiety disorder: Secondary | ICD-10-CM | POA: Diagnosis not present

## 2023-01-05 DIAGNOSIS — F330 Major depressive disorder, recurrent, mild: Secondary | ICD-10-CM | POA: Diagnosis not present

## 2023-02-09 DIAGNOSIS — F411 Generalized anxiety disorder: Secondary | ICD-10-CM | POA: Diagnosis not present

## 2023-02-09 DIAGNOSIS — F02A0 Dementia in other diseases classified elsewhere, mild, without behavioral disturbance, psychotic disturbance, mood disturbance, and anxiety: Secondary | ICD-10-CM | POA: Diagnosis not present

## 2023-02-09 DIAGNOSIS — F330 Major depressive disorder, recurrent, mild: Secondary | ICD-10-CM | POA: Diagnosis not present

## 2023-02-14 ENCOUNTER — Encounter: Payer: Self-pay | Admitting: Adult Health

## 2023-02-14 ENCOUNTER — Non-Acute Institutional Stay: Payer: Medicare (Managed Care) | Admitting: Adult Health

## 2023-02-14 DIAGNOSIS — E039 Hypothyroidism, unspecified: Secondary | ICD-10-CM

## 2023-02-14 DIAGNOSIS — F0390 Unspecified dementia without behavioral disturbance: Secondary | ICD-10-CM

## 2023-02-14 DIAGNOSIS — L299 Pruritus, unspecified: Secondary | ICD-10-CM | POA: Diagnosis not present

## 2023-02-14 NOTE — Progress Notes (Signed)
Location:  [facility] Room Number: 821-A Place of Service:  ALF [phone]) Provider:  Kenard Gower, DNP, FNP-BC  Patient Care Team: Mahlon Gammon, MD as PCP - General (Internal Medicine) Mast, Man X, NP as Nurse Practitioner (Internal Medicine)  Extended Emergency Contact Information Primary Emergency Contact: Lowery,[name] Address: [address]          [facility] [place], [place] [address] Darden Amber of [place] Home Phone: [phone] Mobile Phone: [phone] Relation: Son Secondary Emergency Contact: Sales,Brett Address: [address]          [place], [place] [address] Darden Amber of [place] Home Phone: [phone] Mobile Phone: [phone] Relation: Son  Code Status:  DNR  Goals of care: Advanced Directive information    02/14/2023    9:56 AM  Advanced Directives  Does Patient Have a Medical Advance Directive? Yes  Type of Estate agent of [place];Living will;Out of facility DNR (pink MOST or yellow form)  Does patient want to make changes to medical advance directive? No - Patient declined  Copy of Healthcare Power of Attorney in Chart? Yes - validated most recent copy scanned in chart (See row information)  Pre-existing out of facility DNR order (yellow form or pink MOST form) Pink MOST/Yellow Form most recent copy in chart - Physician notified to receive inpatient order     Chief Complaint  Patient presents with   Acute Visit    Itching skin    HPI:  Pt is a 89 y.o. female seen today for an acute visit regarding itching of skin. She is a resident of [facility]. Staff has been applying Aquaphor ointment to to her skin but she continues to scratch causing erythema and abrasion to skin. She takes Levothyroxine for hypothyroidism. Last tsh 0.26, 04/04/22.    Past Medical History:  Diagnosis Date   Eczema    Hypothyroid    Hypothyroidism    Hypothyroidism    Vitamin D deficiency    Past  Surgical History:  Procedure Laterality Date   CHOLECYSTECTOMY N/A 06/24/2013   Procedure: LAPAROSCOPIC CHOLECYSTECTOMY WITH INTRAOPERATIVE CHOLANGIOGRAM;  Surgeon: Axel Filler, MD;  Location: [facility] OR;  Service: General;  Laterality: N/A;   TONSILLECTOMY      No Known Allergies  Outpatient Encounter Medications as of 02/14/2023  Medication Sig   acetaminophen (TYLENOL) 325 MG tablet Take 650 mg by mouth every 4 (four) hours as needed for fever.   acetaminophen (TYLENOL) 325 MG tablet Take 650 mg by mouth every 4 (four) hours as needed for moderate pain.   aspirin EC 81 MG tablet Take 81 mg by mouth daily. Swallow whole.   calcium-vitamin D (OSCAL WITH D) 500-200 MG-UNIT tablet Take 1 tablet by mouth 2 (two) times daily.   donepezil (ARICEPT) 5 MG tablet Take 5 mg by mouth at bedtime.   Emollient (AQUAPHOR OINTMENT BODY EX) Apply to Back, Chest, BLE, BUE topically one time a day for Dry Skin;Itching;Rash related to DERMATITIS, UNSPECIFIED (L30.9) Apply Aquaphor Ointment to rashes on back chest BUE &BLE daily   escitalopram (LEXAPRO) 20 MG tablet Take 20 mg by mouth daily.   furosemide (LASIX) 20 MG tablet Take 20 mg by mouth daily.   lactose free nutrition (BOOST) LIQD Take by mouth daily. nourishment give 1 bottle with 1 cup vanilla ice cream at 10 am   levothyroxine (SYNTHROID) 100 MCG tablet Take 100 mcg by mouth. one time a day every Sat, Sun   levothyroxine (SYNTHROID) 88  MCG tablet Take 88 mcg by mouth daily. Mon, Tue, Wed, Thu, Fr   LORazepam (ATIVAN) 0.5 MG tablet Take 0.5 mg by mouth every 8 (eight) hours as needed for anxiety. agitation   melatonin 1 MG TABS tablet Take 1 mg by mouth at bedtime.   potassium chloride (MICRO-K) 10 MEQ CR capsule Take 10 mEq by mouth daily.   Sodium Fluoride (PREVIDENT 5000 BOOSTER PLUS) 1.1 % PSTE Place 1 application  onto teeth at bedtime. for oral care USE A PEA SIZE AMOUNT DURING NIGHTTIME OAL CARE. SPIT OUT ANY EXCESS. DO NOT RINSE. DO NOT  EAT/DRINK  FOR 30 MINS AFTER   [DISCONTINUED] GUAIFENESIN PO Give 10 ml by mouth every 6 hours as needed for Cough for 2 Days Notify MD is continued cough. (Patient not taking: Reported on 12/22/2022)   No facility-administered encounter medications on file as of 02/14/2023.    Review of Systems  Unable to obtain due to dementia.   Immunization History  Administered Date(s) Administered   Fluad Quad(high Dose 65+) 07/03/2022   Influenza Whole 06/15/2018   Influenza,inj,Quad PF,6+ Mos 06/25/2013   Influenza-Unspecified 06/23/2020, 06/30/2021   Moderna Sars-Covid-2 Vaccination 09/13/2019, 10/11/2019, 07/20/2020, 02/08/2021   Pfizer Covid-19 Vaccine Bivalent Booster 57yrs & up 05/31/2021, 04/19/2022, 07/13/2022   Pneumococcal Conjugate-13 03/11/2014   Pneumococcal Polysaccharide-23 09/11/1986, 09/11/2001, 06/25/2013   Tdap 03/16/2011, 12/22/2021   Unspecified SARS-COV-2 Vaccination 05/31/2021   Zoster Recombinat (Shingrix) 09/12/2007, 09/02/2022   Pertinent  Health Maintenance Due  Topic Date Due   INFLUENZA VACCINE  04/12/2023   DEXA SCAN  Completed      01/28/2022    3:26 PM 02/15/2022   11:35 AM 04/07/2022   10:29 AM 08/10/2022    9:16 AM 09/19/2022   11:39 AM  Fall Risk  Falls in the past year?  1 1  0  Was there an injury with Fall?  1 1  0  Fall Risk Category Calculator  3 3  0  Fall Risk Category (Retired)  [employer]  Low  (RETIRED) Patient Fall Risk Level High fall risk High fall risk High fall risk High fall risk Low fall risk  Patient at Risk for Falls Due to  History of fall(s) History of fall(s)  No Fall Risks  Fall risk Follow up  Falls evaluation completed Falls evaluation completed;Education provided  Falls evaluation completed     Vitals:   02/14/23 0955  BP: (!) 158/82  Pulse: 67  Resp: 18  Temp: (!) 97.3 F (36.3 C)  SpO2: 93%  Weight: 155 lb 6.4 oz (70.5 kg)  Height: 4\' 8"  (1.422 m)   Body mass index is 34.84 kg/m.  Physical Exam Constitutional:       General: She is not in acute distress.    Appearance: She is obese.  HENT:     Head: Normocephalic and atraumatic.     Nose: Nose normal.     Mouth/Throat:     Mouth: Mucous membranes are moist.  Eyes:     Conjunctiva/sclera: Conjunctivae normal.  Cardiovascular:     Rate and Rhythm: Normal rate and regular rhythm.  Pulmonary:     Effort: Pulmonary effort is normal.     Breath sounds: Normal breath sounds.  Abdominal:     General: Bowel sounds are normal.     Palpations: Abdomen is soft.  Musculoskeletal:        General: Normal range of motion.     Cervical back: Normal range of motion.  Skin:  General: Skin is warm and dry.  Neurological:     Mental Status: She is alert. Mental status is at baseline. She is disoriented.  Psychiatric:        Mood and Affect: Mood normal.        Behavior: Behavior normal.        Thought Content: Thought content normal.        Judgment: Judgment normal.      Labs reviewed: Recent Labs    05/11/22 0000 08/11/22 0000 09/12/22 0000  NA 137 138 141  K 3.9 3.9 3.9  CL 104 105 105  CO2 23* 24* 25*  BUN 10 14 12   CREATININE 0.8 0.8 0.8  CALCIUM 8.8 9.4 8.9   Recent Labs    02/16/22 0000 05/11/22 0000 08/11/22 0000  AST 14 15 17   ALT 11 12 15   ALKPHOS 68 66 79  ALBUMIN 3.5  3.5 3.5 3.7   Recent Labs    02/16/22 0000 05/11/22 0000 08/11/22 0000  WBC 7.9 7.6 10.0  NEUTROABS 3,934.00 3,891.00  --   HGB 15.6  15.6 16.0 16.0  HCT 46  --  45  PLT 187 181 185   Lab Results  Component Value Date   TSH 0.26 (A) 04/04/2022   Lab Results  Component Value Date   HGBA1C 5.7 06/18/2018   Lab Results  Component Value Date   CHOL 188 01/30/2018   HDL 64 01/30/2018   LDLCALC 106 (H) 01/30/2018   TRIG 89 01/30/2018   CHOLHDL 2.9 01/30/2018    Significant Diagnostic Results in last 30 days:  No results found.  Assessment/Plan  1. Pruritus -  will start on Sarna lotion apply to neck, abdomen and extremities (upper and  lower) topically BID  2. Acquired hypothyroidism Lab Results  Component Value Date   TSH 0.26 (A) 04/04/2022   -  will re-check tsh -  continue Levothyroxine  3. Dementia without behavioral disturbance (HCC) -  continue Donepezil -  continue supportive care and fall precautions    Family/ staff Communication: Discussed plan of care with resident and charge nurse  Labs/tests ordered: tsh    Kenard Gower, DNP, MSN, FNP-BC [facility] [phone] (Monday-Friday 8:00 a.m. - 5:00 p.m.) [phone] (after hours)

## 2023-02-15 DIAGNOSIS — E039 Hypothyroidism, unspecified: Secondary | ICD-10-CM | POA: Diagnosis not present

## 2023-02-20 DIAGNOSIS — L820 Inflamed seborrheic keratosis: Secondary | ICD-10-CM | POA: Diagnosis not present

## 2023-02-20 DIAGNOSIS — L853 Xerosis cutis: Secondary | ICD-10-CM | POA: Diagnosis not present

## 2023-02-28 ENCOUNTER — Encounter: Payer: Self-pay | Admitting: Nurse Practitioner

## 2023-02-28 NOTE — Progress Notes (Signed)
This encounter was created in error - please disregard.

## 2023-03-06 DIAGNOSIS — F330 Major depressive disorder, recurrent, mild: Secondary | ICD-10-CM | POA: Diagnosis not present

## 2023-03-06 DIAGNOSIS — F02A0 Dementia in other diseases classified elsewhere, mild, without behavioral disturbance, psychotic disturbance, mood disturbance, and anxiety: Secondary | ICD-10-CM | POA: Diagnosis not present

## 2023-03-06 DIAGNOSIS — F411 Generalized anxiety disorder: Secondary | ICD-10-CM | POA: Diagnosis not present

## 2023-03-13 ENCOUNTER — Non-Acute Institutional Stay: Payer: Medicare (Managed Care) | Admitting: Nurse Practitioner

## 2023-03-13 ENCOUNTER — Encounter: Payer: Self-pay | Admitting: Nurse Practitioner

## 2023-03-13 DIAGNOSIS — F0390 Unspecified dementia without behavioral disturbance: Secondary | ICD-10-CM

## 2023-03-13 DIAGNOSIS — L309 Dermatitis, unspecified: Secondary | ICD-10-CM | POA: Diagnosis not present

## 2023-03-13 DIAGNOSIS — F339 Major depressive disorder, recurrent, unspecified: Secondary | ICD-10-CM

## 2023-03-13 DIAGNOSIS — R269 Unspecified abnormalities of gait and mobility: Secondary | ICD-10-CM | POA: Diagnosis not present

## 2023-03-13 DIAGNOSIS — I509 Heart failure, unspecified: Secondary | ICD-10-CM | POA: Diagnosis not present

## 2023-03-13 NOTE — Assessment & Plan Note (Signed)
on Levothyroxine, TSH 4.69 10/10/22 

## 2023-03-13 NOTE — Progress Notes (Signed)
Location:   [facility] Room Number: AL/821/A Place of Service:  ALF (13) Provider: Arna Snipe Levelle Edelen NP  Mahlon Gammon, MD  Patient Care Team: Mahlon Gammon, MD as PCP - General (Internal Medicine) Tremar Wickens X, NP as Nurse Practitioner (Internal Medicine)  Extended Emergency Contact Information Primary Emergency Contact: Boerema,[name] Address: [address]          [facility] [place], [place] [address] Darden Amber of [place] Home Phone: [phone] Mobile Phone: [phone] Relation: Son Secondary Emergency Contact: Demos,Brett Address: [address]          [place], [place] [address] Darden Amber of [place] Home Phone: [phone] Mobile Phone: [phone] Relation: Son  Code Status: DNR Goals of care: Advanced Directive information    02/14/2023    9:56 AM  Advanced Directives  Does Patient Have a Medical Advance Directive? Yes  Type of Estate agent of [place];Living will;Out of facility DNR (pink MOST or yellow form)  Does patient want to make changes to medical advance directive? No - Patient declined  Copy of Healthcare Power of Attorney in Chart? Yes - validated most recent copy scanned in chart (See row information)  Pre-existing out of facility DNR order (yellow form or pink MOST form) Pink MOST/Yellow Form most recent copy in chart - Physician notified to receive inpatient order     Chief Complaint  Patient presents with   Acute Visit    Patient is being seen for rash    HPI:  Pt is a 89 y.o. female seen today for an acute visit for diffused itching lightly scaly redness/rash with satellite pattern margin upper chest and back, no rash noted in skin folds of under breast and arm or in groins, noted a few around umbilical button, onset and duration are uncertain 2/2 the patient's poor historian.      09/29/22 CXR CHF, 10/01/22 CXR moderate CHF, on Furosemide daily, BNP 151, Bun/creat 14/0.84 10/10/22. 10/06/22 Echo EF 75%              Gait abnormality, uses walker, risk for falling             Dementia, lack of safety awareness,  resides in [facility] for safety, care assistance, on Donepezil for memory. MMSE 19/30 11/02/22             Her mood is stabilizing, but still anxious at times, on Escitalopram, prn Lorazepam             Hypothyroidism, on Levothyroxine, TSH 4.69 10/10/22      Past Medical History:  Diagnosis Date   Eczema    Hypothyroid    Hypothyroidism    Hypothyroidism    Vitamin D deficiency    Past Surgical History:  Procedure Laterality Date   CHOLECYSTECTOMY N/A 06/24/2013   Procedure: LAPAROSCOPIC CHOLECYSTECTOMY WITH INTRAOPERATIVE CHOLANGIOGRAM;  Surgeon: Axel Filler, MD;  Location: [facility] OR;  Service: General;  Laterality: N/A;   TONSILLECTOMY      No Known Allergies  Allergies as of 03/13/2023   No Known Allergies      Medication List        Accurate as of March 13, 2023  3:54 PM. If you have any questions, ask your nurse or doctor.          acetaminophen 325 MG tablet Commonly known as: TYLENOL Take 650 mg by mouth every 4 (four) hours as needed for fever.   acetaminophen 325 MG tablet Commonly known as: TYLENOL Take  650 mg by mouth every 4 (four) hours as needed for moderate pain.   AQUAPHOR OINTMENT BODY EX Apply to Back, Chest, BLE, BUE topically one time a day for Dry Skin;Itching;Rash related to DERMATITIS, UNSPECIFIED (L30.9) Apply Aquaphor Ointment to rashes on back chest BUE &BLE daily   aspirin EC 81 MG tablet Take 81 mg by mouth daily. Swallow whole.   calcium-vitamin D 500-200 MG-UNIT tablet Commonly known as: OSCAL WITH D Take 1 tablet by mouth 2 (two) times daily.   donepezil 5 MG tablet Commonly known as: ARICEPT Take 5 mg by mouth at bedtime.   escitalopram 20 MG tablet Commonly known as: LEXAPRO Take 20 mg by mouth daily.   furosemide 20 MG tablet Commonly known as: LASIX Take 20 mg by mouth daily.   lactose free nutrition Liqd Take by mouth daily.  nourishment give 1 bottle with 1 cup vanilla ice cream at 10 am   levothyroxine 88 MCG tablet Commonly known as: SYNTHROID Take 88 mcg by mouth daily. Mon, Tue, Wed, Thu, Fr   levothyroxine 100 MCG tablet Commonly known as: SYNTHROID Take 100 mcg by mouth. one time a day every Sat, Sun   LORazepam 0.5 MG tablet Commonly known as: ATIVAN Take 0.5 mg by mouth every 8 (eight) hours as needed for anxiety. agitation   melatonin 1 MG Tabs tablet Take 1 mg by mouth at bedtime.   potassium chloride 10 MEQ CR capsule Commonly known as: MICRO-K Take 10 mEq by mouth daily.   PreviDent 5000 Booster Plus 1.1 % Pste Generic drug: Sodium Fluoride Place 1 application  onto teeth at bedtime. for oral care USE A PEA SIZE AMOUNT DURING NIGHTTIME OAL CARE. SPIT OUT ANY EXCESS. DO NOT RINSE. DO NOT EAT/DRINK  FOR 30 MINS AFTER        Review of Systems  Constitutional:  Negative for appetite change, fatigue and fever.  HENT:  Positive for hearing loss. Negative for congestion and voice change.   Eyes:  Negative for visual disturbance.  Respiratory:  Negative for cough, shortness of breath and wheezing.   Cardiovascular:  Negative for leg swelling.  Gastrointestinal:  Negative for abdominal pain and constipation.  Genitourinary:  Negative for dysuria, flank pain and urgency.  Musculoskeletal:  Positive for gait problem.  Skin:  Positive for rash.  Neurological:  Negative for speech difficulty, weakness and light-headedness.       Memory lapses.   Psychiatric/Behavioral:  Positive for confusion. Negative for sleep disturbance. The patient is nervous/anxious.     Immunization History  Administered Date(s) Administered   Fluad Quad(high Dose 65+) 07/03/2022   Influenza Whole 06/15/2018   Influenza,inj,Quad PF,6+ Mos 06/25/2013   Influenza-Unspecified 06/23/2020, 06/30/2021   Moderna Sars-Covid-2 Vaccination 09/13/2019, 10/11/2019, 07/20/2020, 02/08/2021   Pfizer Covid-19 Vaccine Bivalent  Booster 71yrs & up 05/31/2021, 04/19/2022, 07/13/2022   Pneumococcal Conjugate-13 03/11/2014   Pneumococcal Polysaccharide-23 09/11/1986, 09/11/2001, 06/25/2013   Tdap 03/16/2011, 12/22/2021   Unspecified SARS-COV-2 Vaccination 05/31/2021   Zoster Recombinant(Shingrix) 09/12/2007, 09/02/2022   Pertinent  Health Maintenance Due  Topic Date Due   INFLUENZA VACCINE  04/12/2023   DEXA SCAN  Completed      01/28/2022    3:26 PM 02/15/2022   11:35 AM 04/07/2022   10:29 AM 08/10/2022    9:16 AM 09/19/2022   11:39 AM  Fall Risk  Falls in the past year?  1 1  0  Was there an injury with Fall?  1 1  0  Fall Risk  Category Calculator  3 3  0  Fall Risk Category (Retired)  [employer]  Low  (RETIRED) Patient Fall Risk Level High fall risk High fall risk High fall risk High fall risk Low fall risk  Patient at Risk for Falls Due to  History of fall(s) History of fall(s)  No Fall Risks  Fall risk Follow up  Falls evaluation completed Falls evaluation completed;Education provided  Falls evaluation completed   Functional Status Survey:    Vitals:   03/13/23 1307  BP: 122/64  Pulse: 68  Resp: 18  Temp: (!) 97.1 F (36.2 C)  SpO2: 92%  Weight: 155 lb 6.4 oz (70.5 kg)  Height: 4\' 10"  (1.473 m)   Body mass index is 32.48 kg/m. Physical Exam Vitals and nursing note reviewed.  Constitutional:      Appearance: Normal appearance.  HENT:     Head: Normocephalic and atraumatic.     Nose: No congestion.     Mouth/Throat:     Mouth: Mucous membranes are moist.  Eyes:     Extraocular Movements: Extraocular movements intact.     Conjunctiva/sclera: Conjunctivae normal.     Pupils: Pupils are equal, round, and reactive to light.  Cardiovascular:     Rate and Rhythm: Normal rate and regular rhythm.     Heart sounds: No murmur heard. Pulmonary:     Effort: Pulmonary effort is normal.     Breath sounds: No rales.  Abdominal:     General: Bowel sounds are normal.     Palpations: Abdomen is soft.      Tenderness: There is no abdominal tenderness.     Hernia: A hernia is present.     Comments: Umbilical hernia.   Musculoskeletal:     Cervical back: Normal range of motion and neck supple.     Right lower leg: No edema.     Left lower leg: No edema.  Skin:    General: Skin is warm and dry.     Findings: Rash present.     Comments:   diffused itching lightly scaly redness/rash with satellite pattern margin upper chest and back, no rash noted in skin folds of under breast and arm or in groins, noted a few around umbilical button, onset and duration are uncertain 2/2 the patient's poor historian.   Neurological:     General: No focal deficit present.     Mental Status: She is alert. Mental status is at baseline.     Gait: Gait abnormal.     Comments: Oriented to person, place.   Psychiatric:        Mood and Affect: Mood normal.        Behavior: Behavior normal.     Labs reviewed: Recent Labs    05/11/22 0000 08/11/22 0000 09/12/22 0000  NA 137 138 141  K 3.9 3.9 3.9  CL 104 105 105  CO2 23* 24* 25*  BUN 10 14 12   CREATININE 0.8 0.8 0.8  CALCIUM 8.8 9.4 8.9   Recent Labs    05/11/22 0000 08/11/22 0000  AST 15 17  ALT 12 15  ALKPHOS 66 79  ALBUMIN 3.5 3.7   Recent Labs    05/11/22 0000 08/11/22 0000  WBC 7.6 10.0  NEUTROABS 3,891.00  --   HGB 16.0 16.0  HCT  --  45  PLT 181 185   Lab Results  Component Value Date   TSH 0.26 (A) 04/04/2022   Lab Results  Component Value Date  HGBA1C 5.7 06/18/2018   Lab Results  Component Value Date   CHOL 188 01/30/2018   HDL 64 01/30/2018   LDLCALC 106 (H) 01/30/2018   TRIG 89 01/30/2018   CHOLHDL 2.9 01/30/2018    Significant Diagnostic Results in last 30 days:  No results found.  Assessment/Plan: Dermatitis diffused itching lightly scaly redness/rash with satellite pattern margin upper chest and back, no rash noted in skin folds of under breast and arm or in groins, noted a few around umbilical  button, onset and duration are uncertain 2/2 the patient's poor historian. Will try Nystatin/1%Hydrocortisone cream bid x 2weeks, keep skin dry and clean.   CHF (congestive heart failure) (HCC) 09/29/22 CXR CHF, 10/01/22 CXR moderate CHF, on Furosemide daily, BNP 151, Bun/creat 14/0.84 10/10/22. 10/06/22 Echo EF 75%  Gait abnormality  uses walker, risk for falling  Dementia without behavioral disturbance (HCC)  lack of safety awareness,  resides in [facility] for safety, care assistance, on Donepezil for memory. MMSE 19/30 11/02/22  Depression, recurrent (HCC)  but still anxious at times, on Escitalopram, prn Lorazepam  Hypothyroidism on Levothyroxine, TSH 4.69 10/10/22    Family/ staff Communication: plan of care reviewed with the patient and charge nurse.   Labs/tests ordered:  none  Time spend 40 minutes.

## 2023-03-13 NOTE — Assessment & Plan Note (Signed)
uses walker, risk for falling 

## 2023-03-13 NOTE — Assessment & Plan Note (Signed)
lack of safety awareness,  resides in [facility] for safety, care assistance, on Donepezil for memory. MMSE 19/30 11/02/22 

## 2023-03-13 NOTE — Assessment & Plan Note (Signed)
but still anxious at times, on Escitalopram, prn Lorazepam

## 2023-03-13 NOTE — Assessment & Plan Note (Signed)
diffused itching lightly scaly redness/rash with satellite pattern margin upper chest and back, no rash noted in skin folds of under breast and arm or in groins, noted a few around umbilical button, onset and duration are uncertain 2/2 the patient's poor historian. Will try Nystatin/1%Hydrocortisone cream bid x 2weeks, keep skin dry and clean.

## 2023-03-13 NOTE — Assessment & Plan Note (Signed)
09/29/22 CXR CHF, 10/01/22 CXR moderate CHF, on Furosemide daily, BNP 151, Bun/creat 14/0.84 10/10/22. 10/06/22 Echo EF 75% 

## 2023-03-20 ENCOUNTER — Encounter: Payer: Self-pay | Admitting: Nurse Practitioner

## 2023-03-20 ENCOUNTER — Non-Acute Institutional Stay: Payer: Medicare (Managed Care) | Admitting: Nurse Practitioner

## 2023-03-20 DIAGNOSIS — E039 Hypothyroidism, unspecified: Secondary | ICD-10-CM | POA: Diagnosis not present

## 2023-03-20 DIAGNOSIS — I509 Heart failure, unspecified: Secondary | ICD-10-CM | POA: Diagnosis not present

## 2023-03-20 DIAGNOSIS — F339 Major depressive disorder, recurrent, unspecified: Secondary | ICD-10-CM | POA: Diagnosis not present

## 2023-03-20 DIAGNOSIS — L853 Xerosis cutis: Secondary | ICD-10-CM | POA: Diagnosis not present

## 2023-03-20 DIAGNOSIS — L821 Other seborrheic keratosis: Secondary | ICD-10-CM | POA: Diagnosis not present

## 2023-03-20 DIAGNOSIS — R269 Unspecified abnormalities of gait and mobility: Secondary | ICD-10-CM

## 2023-03-20 DIAGNOSIS — L299 Pruritus, unspecified: Secondary | ICD-10-CM | POA: Diagnosis not present

## 2023-03-20 DIAGNOSIS — F0390 Unspecified dementia without behavioral disturbance: Secondary | ICD-10-CM | POA: Diagnosis not present

## 2023-03-20 NOTE — Assessment & Plan Note (Signed)
Compensated clinically, 09/29/22 CXR CHF, 10/01/22 CXR moderate CHF, on Furosemide daily, BNP 151, Bun/creat 14/0.84 10/10/22. 10/06/22 Echo EF 75%

## 2023-03-20 NOTE — Assessment & Plan Note (Signed)
lack of safety awareness,  resides in [facility] for safety, care assistance, on Donepezil for memory. MMSE 19/30 11/02/22 

## 2023-03-20 NOTE — Assessment & Plan Note (Signed)
uses walker, risk for falling 

## 2023-03-20 NOTE — Assessment & Plan Note (Signed)
Her mood is stabilizing, but still anxious at times, on Escitalopram, prn Lorazepam 

## 2023-03-20 NOTE — Progress Notes (Signed)
Location:  [facility] Room Number: AL/[id number]/A Place of Service:  ALF (13) Provider:  Emilly Lavey X, NP    Patient Care Team: Mahlon Gammon, MD as PCP - General (Internal Medicine) Sharai Overbay X, NP as Nurse Practitioner (Internal Medicine)  Extended Emergency Contact Information Primary Emergency Contact: Ashby,[name] Address: [address]          [facility] [place], [place] [address] Darden Amber of [place] Home Phone: [phone] Mobile Phone: [phone] Relation: Son Secondary Emergency Contact: Guard,Brett Address: [address]          [place], [place] [address] Darden Amber of [place] Home Phone: [phone] Mobile Phone: [phone] Relation: Son  Code Status:  DNR Goals of care: Advanced Directive information    03/20/2023   11:55 AM  Advanced Directives  Does Patient Have a Medical Advance Directive? Yes  Type of Estate agent of [place];Living will;Out of facility DNR (pink MOST or yellow form)  Does patient want to make changes to medical advance directive? No - Patient declined  Copy of Healthcare Power of Attorney in Chart? Yes - validated most recent copy scanned in chart (See row information)  Pre-existing out of facility DNR order (yellow form or pink MOST form) Pink MOST/Yellow Form most recent copy in chart - Physician notified to receive inpatient order     Chief Complaint  Patient presents with   Medical Management of Chronic Issues    Routine Visit   Quality Metric Gaps    Needs to discuss Covid vaccine    HPI:  Pt is a 89 y.o. female seen today for medical management of chronic diseases.    09/29/22 CXR CHF, 10/01/22 CXR moderate CHF, on Furosemide daily, BNP 151, Bun/creat 14/0.84 10/10/22. 10/06/22 Echo EF 75%             Gait abnormality, uses walker, risk for falling             Dementia, lack of safety awareness,  [facility] for [facility], care assistance, on Donepezil for memory. MMSE 19/30  11/02/22             Her mood is stabilizing, but still anxious at times, on Escitalopram, prn Lorazepam             Hypothyroidism, on Levothyroxine, TSH 4.69 10/10/22   Past Medical History:  Diagnosis Date   Eczema    Hypothyroid    Hypothyroidism    Hypothyroidism    Vitamin D deficiency    Past Surgical History:  Procedure Laterality Date   CHOLECYSTECTOMY N/A 06/24/2013   Procedure: LAPAROSCOPIC CHOLECYSTECTOMY WITH INTRAOPERATIVE CHOLANGIOGRAM;  Surgeon: Axel Filler, MD;  Location: [facility];  Service: General;  Laterality: N/A;   TONSILLECTOMY      No Known Allergies  Outpatient Encounter Medications as of 03/20/2023  Medication Sig   acetaminophen (TYLENOL) 325 MG tablet Take 650 mg by mouth every 4 (four) hours as needed for fever.   acetaminophen (TYLENOL) 325 MG tablet Take 650 mg by mouth every 4 (four) hours as needed for moderate pain.   aspirin EC 81 MG tablet Take 81 mg by mouth daily. Swallow whole.   calcium-vitamin D (OSCAL WITH D) 500-200 MG-UNIT tablet Take 1 tablet by mouth 2 (two) times daily.   donepezil (ARICEPT) 5 MG tablet Take 5 mg by mouth at bedtime.   Emollient (AQUAPHOR OINTMENT BODY EX) Apply to Back, Chest, BLE, BUE topically one time a day for Dry Skin;Itching;Rash  related to DERMATITIS, UNSPECIFIED (L30.9) Apply Aquaphor Ointment to rashes on back chest BUE &BLE daily   escitalopram (LEXAPRO) 20 MG tablet Take 20 mg by mouth daily.   furosemide (LASIX) 20 MG tablet Take 20 mg by mouth daily.   lactose free nutrition (BOOST) LIQD Take by mouth daily. nourishment give 1 bottle with 1 cup vanilla ice cream at 10 am   levothyroxine (SYNTHROID) 100 MCG tablet Take 100 mcg by mouth. one time a day every Sat, Sun   levothyroxine (SYNTHROID) 88 MCG tablet Take 88 mcg by mouth daily. Mon, Tue, Wed, Thu, Fr   LORazepam (ATIVAN) 0.5 MG tablet Take 0.5 mg by mouth every 8 (eight) hours as needed for anxiety. agitation   melatonin 1 MG TABS tablet Take 1 mg by  mouth at bedtime.   potassium chloride (MICRO-K) 10 MEQ CR capsule Take 10 mEq by mouth daily.   Sodium Fluoride (PREVIDENT 5000 BOOSTER PLUS) 1.1 % PSTE Place 1 application  onto teeth at bedtime. for oral care USE A PEA SIZE AMOUNT DURING NIGHTTIME OAL CARE. SPIT OUT ANY EXCESS. DO NOT RINSE. DO NOT EAT/DRINK  FOR 30 MINS AFTER   No facility-administered encounter medications on file as of 03/20/2023.    Review of Systems  Constitutional:  Negative for appetite change, fatigue and fever.  HENT:  Positive for hearing loss. Negative for congestion and voice change.   Eyes:  Negative for visual disturbance.  Respiratory:  Negative for cough, shortness of breath and wheezing.   Cardiovascular:  Negative for leg swelling.  Gastrointestinal:  Negative for abdominal pain and constipation.  Genitourinary:  Negative for dysuria, flank pain and urgency.  Musculoskeletal:  Positive for gait problem.  Skin:  Negative for rash.  Neurological:  Negative for speech difficulty, weakness and light-headedness.       Memory lapses.   Psychiatric/Behavioral:  Positive for confusion. Negative for sleep disturbance. The patient is nervous/anxious.     Immunization History  Administered Date(s) Administered   Fluad Quad(high Dose 65+) 07/03/2022   Influenza Whole 06/15/2018   Influenza,inj,Quad PF,6+ Mos 06/25/2013   Influenza-Unspecified 06/23/2020, 06/30/2021   Moderna Sars-Covid-2 Vaccination 09/13/2019, 10/11/2019, 07/20/2020, 02/08/2021   Pfizer Covid-19 Vaccine Bivalent Booster 38yrs & up 05/31/2021, 04/19/2022, 07/13/2022   Pneumococcal Conjugate-13 03/11/2014   Pneumococcal Polysaccharide-23 09/11/1986, 09/11/2001, 06/25/2013   Tdap 03/16/2011, 12/22/2021   Unspecified SARS-COV-2 Vaccination 05/31/2021   Zoster Recombinant(Shingrix) 09/12/2007, 09/02/2022   Pertinent  Health Maintenance Due  Topic Date Due   INFLUENZA VACCINE  04/12/2023   DEXA SCAN  Completed      01/28/2022    3:26 PM  02/15/2022   11:35 AM 04/07/2022   10:29 AM 08/10/2022    9:16 AM 09/19/2022   11:39 AM  Fall Risk  Falls in the past year?  1 1  0  Was there an injury with Fall?  1 1  0  Fall Risk Category Calculator  3 3  0  Fall Risk Category (Retired)  [employer]  Low  (RETIRED) Patient Fall Risk Level High fall risk High fall risk High fall risk High fall risk Low fall risk  Patient at Risk for Falls Due to  History of fall(s) History of fall(s)  No Fall Risks  Fall risk Follow up  Falls evaluation completed Falls evaluation completed;Education provided  Falls evaluation completed   Functional Status Survey:    Vitals:   03/20/23 1151  BP: 127/62  Pulse: 70  Resp: 18  Temp: 97.9  F (36.6 C)  SpO2: 90%  Weight: 155 lb 6.4 oz (70.5 kg)  Height: 4\' 10"  (1.473 m)   Body mass index is 32.48 kg/m. Physical Exam Vitals and nursing note reviewed.  Constitutional:      Appearance: Normal appearance.  HENT:     Head: Normocephalic and atraumatic.     Nose: No congestion.     Mouth/Throat:     Mouth: Mucous membranes are moist.  Eyes:     Extraocular Movements: Extraocular movements intact.     Conjunctiva/sclera: Conjunctivae normal.     Pupils: Pupils are equal, round, and reactive to light.  Cardiovascular:     Rate and Rhythm: Normal rate and regular rhythm.     Heart sounds: No murmur heard. Pulmonary:     Effort: Pulmonary effort is normal.     Breath sounds: No rales.  Abdominal:     General: Bowel sounds are normal.     Palpations: Abdomen is soft.     Tenderness: There is no abdominal tenderness.     Hernia: A hernia is present.     Comments: Umbilical hernia.   Musculoskeletal:     Cervical back: Normal range of motion and neck supple.     Right lower leg: No edema.     Left lower leg: No edema.  Skin:    General: Skin is warm and dry.     Findings: No rash.  Neurological:     General: No focal deficit present.     Mental Status: She is alert. Mental status is at  baseline.     Gait: Gait abnormal.     Comments: Oriented to person, place.   Psychiatric:        Mood and Affect: Mood normal.        Behavior: Behavior normal.     Labs reviewed: Recent Labs    05/11/22 0000 08/11/22 0000 09/12/22 0000  NA 137 138 141  K 3.9 3.9 3.9  CL 104 105 105  CO2 23* 24* 25*  BUN 10 14 12   CREATININE 0.8 0.8 0.8  CALCIUM 8.8 9.4 8.9   Recent Labs    05/11/22 0000 08/11/22 0000  AST 15 17  ALT 12 15  ALKPHOS 66 79  ALBUMIN 3.5 3.7   Recent Labs    05/11/22 0000 08/11/22 0000  WBC 7.6 10.0  NEUTROABS 3,891.00  --   HGB 16.0 16.0  HCT  --  45  PLT 181 185   Lab Results  Component Value Date   TSH 0.26 (A) 04/04/2022   Lab Results  Component Value Date   HGBA1C 5.7 06/18/2018   Lab Results  Component Value Date   CHOL 188 01/30/2018   HDL 64 01/30/2018   LDLCALC 106 (H) 01/30/2018   TRIG 89 01/30/2018   CHOLHDL 2.9 01/30/2018    Significant Diagnostic Results in last 30 days:  No results found.  Assessment/Plan Depression, recurrent (HCC) Her mood is stabilizing, but still anxious at times, on Escitalopram, prn Lorazepam  Hypothyroidism on Levothyroxine, TSH 4.69 10/10/22  Dementia without behavioral disturbance (HCC) lack of safety awareness,  resides in [facility] for safety, care assistance, on Donepezil for memory. MMSE 19/30 11/02/22  Gait abnormality uses walker, risk for falling  CHF (congestive heart failure) (HCC) Compensated clinically, 09/29/22 CXR CHF, 10/01/22 CXR moderate CHF, on Furosemide daily, BNP 151, Bun/creat 14/0.84 10/10/22. 10/06/22 Echo EF 75%     Family/ staff Communication: plan of care reviewed with the  patient, the patient's son/daughter, and charge nurse.   Labs/tests ordered:  none  Time spend 40 minutes.

## 2023-03-20 NOTE — Assessment & Plan Note (Signed)
on Levothyroxine, TSH 4.69 10/10/22 

## 2023-03-27 DIAGNOSIS — B351 Tinea unguium: Secondary | ICD-10-CM | POA: Diagnosis not present

## 2023-03-27 DIAGNOSIS — M79671 Pain in right foot: Secondary | ICD-10-CM | POA: Diagnosis not present

## 2023-03-27 DIAGNOSIS — M79672 Pain in left foot: Secondary | ICD-10-CM | POA: Diagnosis not present

## 2023-04-03 ENCOUNTER — Encounter: Payer: Self-pay | Admitting: Nurse Practitioner

## 2023-04-03 ENCOUNTER — Non-Acute Institutional Stay: Payer: Medicare (Managed Care) | Admitting: Nurse Practitioner

## 2023-04-03 DIAGNOSIS — F0390 Unspecified dementia without behavioral disturbance: Secondary | ICD-10-CM | POA: Diagnosis not present

## 2023-04-03 DIAGNOSIS — R269 Unspecified abnormalities of gait and mobility: Secondary | ICD-10-CM

## 2023-04-03 DIAGNOSIS — I509 Heart failure, unspecified: Secondary | ICD-10-CM

## 2023-04-03 DIAGNOSIS — F339 Major depressive disorder, recurrent, unspecified: Secondary | ICD-10-CM

## 2023-04-03 DIAGNOSIS — I959 Hypotension, unspecified: Secondary | ICD-10-CM

## 2023-04-03 NOTE — Assessment & Plan Note (Signed)
uses walker, risk for falling

## 2023-04-03 NOTE — Progress Notes (Unsigned)
[facility] Room Number: [phone] [id number] Place of Service:  ALF (13) Provider: [facility] Karston Hyland NP  Mahlon Gammon, MD  Patient Care Team: Mahlon Gammon, MD as PCP - General (Internal Medicine) Wenona Mayville X, NP as Nurse Practitioner (Internal Medicine)  Extended Emergency Contact Information Primary Emergency Contact: Difabio,[name] Address: [address]          [facility] [place], [place] [address] Darden Amber of [place] Home Phone: [phone] Mobile Phone: [phone] Relation: Son Secondary Emergency Contact: Castrejon,Brett Address: [address]          [place], [place] [address] Darden Amber of [place] Home Phone: [phone] Mobile Phone: [phone] Relation: Son  Code Status: DNR Goals of care: Advanced Directive information    04/03/2023    3:31 PM  Advanced Directives  Does Patient Have a Medical Advance Directive? Yes  Type of Estate agent of [place];Living will;Out of facility DNR (pink MOST or yellow form)  Does patient want to make changes to medical advance directive? No - Patient declined  Copy of Healthcare Power of Attorney in Chart? Yes - validated most recent copy scanned in chart (See row information)  Pre-existing out of facility DNR order (yellow form or pink MOST form) Pink MOST form placed in chart (order not valid for inpatient use);Yellow form placed in chart (order not valid for inpatient use)     Chief Complaint  Patient presents with   Acute Visit    Roof of mouth sore    HPI:  Pt is a 89 y.o. female seen today for an acute visit for reported soreness in the roof of mouth 04/02/23, multiple teeth removed last week. No pain, redness, lesion, soreness in her gum, roof of mouth, or the rest of oral linings upon my examination today.    09/29/22 CXR CHF, 10/01/22 CXR moderate CHF, on Furosemide daily, BNP 151, Bun/creat 14/0.84 10/10/22. 10/06/22 Echo EF 75%             Gait abnormality, uses walker, risk for falling              Dementia, lack of safety awareness,  [facility]ides in [facility] for [facility], care assistance, on Donepezil for memory. MMSE 19/30 11/02/22             Her mood is stabilizing, but still anxious at times, on Escitalopram, prn Lorazepam             Hypothyroidism, on Levothyroxine, TSH 4.69 10/10/22         Past Medical History:  Diagnosis Date   Eczema    Hypothyroid    Hypothyroidism    Hypothyroidism    Vitamin D deficiency    Past Surgical History:  Procedure Laterality Date   CHOLECYSTECTOMY N/A 06/24/2013   Procedure: LAPAROSCOPIC CHOLECYSTECTOMY WITH INTRAOPERATIVE CHOLANGIOGRAM;  Surgeon: Axel Filler, MD;  Location: [facility] OR;  Service: General;  Laterality: N/A;   TONSILLECTOMY      No Known Allergies  Allergies as of 04/03/2023   No Known Allergies      Medication List        Accurate as of April 03, 2023 11:59 PM. If you have any questions, ask your nurse or doctor.          acetaminophen 325 MG tablet Commonly known as: TYLENOL Take 650 mg by mouth every 4 (four) hours as needed for fever.   acetaminophen 325 MG tablet Commonly known as: TYLENOL Take 650 mg by  mouth every 4 (four) hours as needed for moderate pain.   AQUAPHOR OINTMENT BODY EX Apply to Back, Chest, BLE, BUE topically one time a day for Dry Skin;Itching;Rash related to DERMATITIS, UNSPECIFIED (L30.9) Apply Aquaphor Ointment to rashes on back chest BUE &BLE daily   aspirin EC 81 MG tablet Take 81 mg by mouth daily. Swallow whole.   calcium-vitamin D 500-200 MG-UNIT tablet Commonly known as: OSCAL WITH D Take 1 tablet by mouth 2 (two) times daily.   donepezil 5 MG tablet Commonly known as: ARICEPT Take 5 mg by mouth at bedtime.   escitalopram 20 MG tablet Commonly known as: LEXAPRO Take 20 mg by mouth daily.   furosemide 20 MG tablet Commonly known as: LASIX Take 20 mg by mouth daily.   lactose free nutrition Liqd Take by mouth daily. nourishment give 1 bottle with 1 cup  vanilla ice cream at 10 am   levothyroxine 88 MCG tablet Commonly known as: SYNTHROID Take 88 mcg by mouth daily. Mon, Tue, Wed, Thu, Fr   levothyroxine 100 MCG tablet Commonly known as: SYNTHROID Take 100 mcg by mouth. one time a day every Sat, Sun   Lidocaine & Camphor-Men Lotn 5 & 0.5-0.5 % Kit Apply topically. Apply to neck, abdomen,extremities topically two times a day for Itching   LORazepam 0.5 MG tablet Commonly known as: ATIVAN Take 0.5 mg by mouth every 8 (eight) hours as needed for anxiety. agitation   melatonin 1 MG Tabs tablet Take 1 mg by mouth at bedtime.   potassium chloride 10 MEQ CR capsule Commonly known as: MICRO-K Take 10 mEq by mouth daily.   PreviDent 5000 Booster Plus 1.1 % Pste Generic drug: Sodium Fluoride Place 1 application  onto teeth at bedtime. for oral care USE A PEA SIZE AMOUNT DURING NIGHTTIME OAL CARE. SPIT OUT ANY EXCESS. DO NOT RINSE. DO NOT EAT/DRINK  FOR 30 MINS AFTER        Review of Systems  Constitutional:  Negative for appetite change, fatigue and fever.  HENT:  Positive for hearing loss. Negative for congestion and voice change.   Eyes:  Negative for visual disturbance.  Respiratory:  Negative for cough, shortness of breath and wheezing.   Cardiovascular:  Negative for leg swelling.  Gastrointestinal:  Negative for abdominal pain and constipation.  Genitourinary:  Negative for dysuria, flank pain and urgency.  Musculoskeletal:  Positive for gait problem.  Skin:  Negative for rash.  Neurological:  Negative for speech difficulty, weakness and light-headedness.       Memory lapses.   Psychiatric/Behavioral:  Positive for confusion. Negative for sleep disturbance. The patient is nervous/anxious.     Immunization History  Administered Date(s) Administered   Fluad Quad(high Dose 65+) 07/03/2022   Influenza Whole 06/15/2018   Influenza,inj,Quad PF,6+ Mos 06/25/2013   Influenza-Unspecified 06/23/2020, 06/30/2021   Moderna  Sars-Covid-2 Vaccination 09/13/2019, 10/11/2019, 07/20/2020, 02/08/2021   Pfizer Covid-19 Vaccine Bivalent Booster 62yrs & up 05/31/2021, 04/19/2022, 07/13/2022   Pneumococcal Conjugate-13 03/11/2014   Pneumococcal Polysaccharide-23 09/11/1986, 09/11/2001, 06/25/2013   Tdap 03/16/2011, 12/22/2021   Unspecified SARS-COV-2 Vaccination 05/31/2021   Zoster Recombinant(Shingrix) 09/12/2007, 09/02/2022   Pertinent  Health Maintenance Due  Topic Date Due   INFLUENZA VACCINE  04/12/2023   DEXA SCAN  Completed      01/28/2022    3:26 PM 02/15/2022   11:35 AM 04/07/2022   10:29 AM 08/10/2022    9:16 AM 09/19/2022   11:39 AM  Fall Risk  Falls in the past  year?  1 1  0  Was there an injury with Fall?  1 1  0  Fall Risk Category Calculator  3 3  0  Fall Risk Category (Retired)  [employer]  Low  (RETIRED) Patient Fall Risk Level High fall risk High fall risk High fall risk High fall risk Low fall risk  Patient at Risk for Falls Due to  History of fall(s) History of fall(s)  No Fall Risks  Fall risk Follow up  Falls evaluation completed Falls evaluation completed;Education provided  Falls evaluation completed   Functional Status Survey:    Vitals:   04/03/23 1529 04/04/23 1235  BP: (!) 114/50 (!) 114/50  Pulse: 64   Resp: 18   Temp: 97.6 F (36.4 C)   SpO2: 90%   Weight: 155 lb 6.4 oz (70.5 kg)   Height: 4\' 10"  (1.473 m)    Body mass index is 32.48 kg/m. Physical Exam Vitals and nursing note reviewed.  Constitutional:      Appearance: Normal appearance.  HENT:     Head: Normocephalic and atraumatic.     Nose: No congestion.     Mouth/Throat:     Mouth: Mucous membranes are moist.  Eyes:     Extraocular Movements: Extraocular movements intact.     Conjunctiva/sclera: Conjunctivae normal.     Pupils: Pupils are equal, round, and reactive to light.  Cardiovascular:     Rate and Rhythm: Normal rate and regular rhythm.     Heart sounds: No murmur heard. Pulmonary:     Effort:  Pulmonary effort is normal.     Breath sounds: No rales.  Abdominal:     General: Bowel sounds are normal.     Palpations: Abdomen is soft.     Tenderness: There is no abdominal tenderness.     Hernia: A hernia is present.     Comments: Umbilical hernia.   Musculoskeletal:     Cervical back: Normal range of motion and neck supple.     Right lower leg: No edema.     Left lower leg: No edema.  Skin:    General: Skin is warm and dry.     Findings: No rash.  Neurological:     General: No focal deficit present.     Mental Status: She is alert. Mental status is at baseline.     Gait: Gait abnormal.     Comments: Oriented to person, place.   Psychiatric:        Mood and Affect: Mood normal.        Behavior: Behavior normal.     Labs reviewed: Recent Labs    05/11/22 0000 08/11/22 0000 09/12/22 0000  NA 137 138 141  K 3.9 3.9 3.9  CL 104 105 105  CO2 23* 24* 25*  BUN 10 14 12   CREATININE 0.8 0.8 0.8  CALCIUM 8.8 9.4 8.9   Recent Labs    05/11/22 0000 08/11/22 0000  AST 15 17  ALT 12 15  ALKPHOS 66 79  ALBUMIN 3.5 3.7   Recent Labs    05/11/22 0000 08/11/22 0000  WBC 7.6 10.0  NEUTROABS 3,891.00  --   HGB 16.0 16.0  HCT  --  45  PLT 181 185   Lab Results  Component Value Date   TSH 0.26 (A) 04/04/2022   Lab Results  Component Value Date   HGBA1C 5.7 06/18/2018   Lab Results  Component Value Date   CHOL 188 01/30/2018   HDL  64 01/30/2018   LDLCALC 106 (H) 01/30/2018   TRIG 89 01/30/2018   CHOLHDL 2.9 01/30/2018    Significant Diagnostic Results in last 30 days:  No results found.  Assessment/Plan: CHF (congestive heart failure) (HCC) 09/29/22 CXR CHF, 10/01/22 CXR moderate CHF, on Furosemide daily, BNP 151, Bun/creat 14/0.84 10/10/22. 10/06/22 Echo EF 75%  Gait abnormality  uses walker, risk for falling  Dementia without behavioral disturbance (HCC) lack of safety awareness,  resides in [facility] for safety, care assistance, on Donepezil for  memory. MMSE 19/30 11/02/22  Depression, recurrent (HCC) Her mood is stabilizing, but still anxious at times, on Escitalopram, prn Lorazepam  Hypothyroidism on Levothyroxine, TSH 4.69 10/10/22  Hypotension Bp 114/50, asymptomatic, Furosemide is contributory, observe.     Family/ staff Communication: plan of care reviewed with the patient and charge nurse.   Labs/tests ordered:  none  Time spend 40 minutes

## 2023-04-03 NOTE — Assessment & Plan Note (Signed)
09/29/22 CXR CHF, 10/01/22 CXR moderate CHF, on Furosemide daily, BNP 151, Bun/creat 14/0.84 10/10/22. 10/06/22 Echo EF 75% 

## 2023-04-03 NOTE — Assessment & Plan Note (Signed)
lack of safety awareness,  resides in [facility] for safety, care assistance, on Donepezil for memory. MMSE 19/30 11/02/22

## 2023-04-03 NOTE — Assessment & Plan Note (Signed)
on Levothyroxine, TSH 4.69 10/10/22 

## 2023-04-03 NOTE — Assessment & Plan Note (Signed)
Her mood is stabilizing, but still anxious at times, on Escitalopram, prn Lorazepam 

## 2023-04-04 ENCOUNTER — Encounter: Payer: Self-pay | Admitting: Nurse Practitioner

## 2023-04-04 DIAGNOSIS — I959 Hypotension, unspecified: Secondary | ICD-10-CM | POA: Insufficient documentation

## 2023-04-04 NOTE — Assessment & Plan Note (Signed)
Bp 114/50, asymptomatic, Furosemide is contributory, observe.

## 2023-04-24 DIAGNOSIS — L820 Inflamed seborrheic keratosis: Secondary | ICD-10-CM | POA: Diagnosis not present

## 2023-04-24 DIAGNOSIS — L2081 Atopic neurodermatitis: Secondary | ICD-10-CM | POA: Diagnosis not present

## 2023-05-04 DIAGNOSIS — F02A0 Dementia in other diseases classified elsewhere, mild, without behavioral disturbance, psychotic disturbance, mood disturbance, and anxiety: Secondary | ICD-10-CM | POA: Diagnosis not present

## 2023-05-04 DIAGNOSIS — F330 Major depressive disorder, recurrent, mild: Secondary | ICD-10-CM | POA: Diagnosis not present

## 2023-05-04 DIAGNOSIS — F411 Generalized anxiety disorder: Secondary | ICD-10-CM | POA: Diagnosis not present

## 2023-05-23 ENCOUNTER — Non-Acute Institutional Stay: Payer: Self-pay | Admitting: Adult Health

## 2023-05-23 ENCOUNTER — Encounter: Payer: Self-pay | Admitting: Adult Health

## 2023-05-23 DIAGNOSIS — U071 COVID-19: Secondary | ICD-10-CM

## 2023-05-23 MED ORDER — NIRMATRELVIR/RITONAVIR (PAXLOVID)TABLET: 3.0000 | ORAL_TABLET | Freq: Two times a day (BID) | ORAL | 0 refills | Status: AC

## 2023-05-23 MED ORDER — GUAIFENESIN ER 600 MG PO TB12: 600.0000 mg | ORAL_TABLET | Freq: Two times a day (BID) | ORAL | 0 refills | Status: AC

## 2023-05-23 NOTE — Progress Notes (Signed)
Location:  [facility] Room Number: [phone] Place of Service:  ALF [phone]) Provider:  Kenard Gower, DNP, FNP-BC  Patient Care Team: Mahlon Gammon, MD as PCP - General (Internal Medicine) Mast, Man X, NP as Nurse Practitioner (Internal Medicine)  Extended Emergency Contact Information Primary Emergency Contact: Bruins,[name] Address: [address]          [facility], [place] [address] Darden Amber of [place] Home Phone: [phone] Mobile Phone: [phone] Relation: Son Secondary Emergency Contact: Pacha,Brett Address: [address]          [place], [place] [address] Darden Amber of [place] Home Phone: [phone] Mobile Phone: [phone] Relation: Son  Code Status:  DNR  Goals of care: Advanced Directive information    04/03/2023    3:31 PM  Advanced Directives  Does Patient Have a Medical Advance Directive? Yes  Type of Estate agent of [place];Living will;Out of facility DNR (pink MOST or yellow form)  Does patient want to make changes to medical advance directive? No - Patient declined  Copy of Healthcare Power of Attorney in Chart? Yes - validated most recent copy scanned in chart (See row information)  Pre-existing out of facility DNR order (yellow form or pink MOST form) Pink MOST form placed in chart (order not valid for inpatient use);Yellow form placed in chart (order not valid for inpatient use)     Chief Complaint  Patient presents with   Acute Visit    +COVID-19 test    HPI:  Pt is a 89 y.o. female seen today for an acute visit due to testing + for COVID-19. She is a resident of [facility]. COVID-19 testing was done due to a routine check. She was earlier seen walking with a walker and then sat down on the lounge. She has productive cough, no fever, nor shortness of breath. She was reported to have good appetite.   Past Medical History:  Diagnosis Date   Eczema    Hypothyroid     Hypothyroidism    Hypothyroidism    Vitamin D deficiency    Past Surgical History:  Procedure Laterality Date   CHOLECYSTECTOMY N/A 06/24/2013   Procedure: LAPAROSCOPIC CHOLECYSTECTOMY WITH INTRAOPERATIVE CHOLANGIOGRAM;  Surgeon: Axel Filler, MD;  Location: [facility] OR;  Service: General;  Laterality: N/A;   TONSILLECTOMY      No Known Allergies  Outpatient Encounter Medications as of 05/23/2023  Medication Sig   nirmatrelvir/ritonavir (PAXLOVID) 20 x 150 MG & 10 x 100MG  TABS Take 3 tablets by mouth 2 (two) times daily for 5 days. (Take nirmatrelvir 150 mg two tablets twice daily for 5 days and ritonavir 100 mg one tablet twice daily for 5 days) Patient GFR is 64 , 10/10/22   acetaminophen (TYLENOL) 325 MG tablet Take 650 mg by mouth every 4 (four) hours as needed for fever.   acetaminophen (TYLENOL) 325 MG tablet Take 650 mg by mouth every 4 (four) hours as needed for moderate pain.   aspirin EC 81 MG tablet Take 81 mg by mouth daily. Swallow whole.   calcium-vitamin D (OSCAL WITH D) 500-200 MG-UNIT tablet Take 1 tablet by mouth 2 (two) times daily.   donepezil (ARICEPT) 5 MG tablet Take 5 mg by mouth at bedtime.   Emollient (AQUAPHOR OINTMENT BODY EX) Apply to Back, Chest, BLE, BUE topically one time a day for Dry Skin;Itching;Rash related to DERMATITIS, UNSPECIFIED (L30.9) Apply Aquaphor Ointment to rashes on back chest BUE &BLE daily  escitalopram (LEXAPRO) 20 MG tablet Take 20 mg by mouth daily.   furosemide (LASIX) 20 MG tablet Take 20 mg by mouth daily.   lactose free nutrition (BOOST) LIQD Take by mouth daily. nourishment give 1 bottle with 1 cup vanilla ice cream at 10 am   levothyroxine (SYNTHROID) 100 MCG tablet Take 100 mcg by mouth. one time a day every Sat, Sun   levothyroxine (SYNTHROID) 88 MCG tablet Take 88 mcg by mouth daily. Mon, Tue, Wed, Thu, Fr   Lidocaine & Camphor-Men Lotn 5 & 0.5-0.5 % KIT Apply topically. Apply to neck, abdomen,extremities topically two times a day  for Itching   LORazepam (ATIVAN) 0.5 MG tablet Take 0.5 mg by mouth every 8 (eight) hours as needed for anxiety. agitation   melatonin 1 MG TABS tablet Take 1 mg by mouth at bedtime.   potassium chloride (MICRO-K) 10 MEQ CR capsule Take 10 mEq by mouth daily.   Sodium Fluoride (PREVIDENT 5000 BOOSTER PLUS) 1.1 % PSTE Place 1 application  onto teeth at bedtime. for oral care USE A PEA SIZE AMOUNT DURING NIGHTTIME OAL CARE. SPIT OUT ANY EXCESS. DO NOT RINSE. DO NOT EAT/DRINK  FOR 30 MINS AFTER   No facility-administered encounter medications on file as of 05/23/2023.    Review of Systems  Unable to obtain due to dementia.    Immunization History  Administered Date(s) Administered   Fluad Quad(high Dose 65+) 07/03/2022   Influenza Whole 06/15/2018   Influenza,inj,Quad PF,6+ Mos 06/25/2013   Influenza-Unspecified 06/23/2020, 06/30/2021   Moderna Sars-Covid-2 Vaccination 09/13/2019, 10/11/2019, 07/20/2020, 02/08/2021   Pfizer Covid-19 Vaccine Bivalent Booster 34yrs & up 05/31/2021, 04/19/2022, 07/13/2022   Pneumococcal Conjugate-13 03/11/2014   Pneumococcal Polysaccharide-23 09/11/1986, 09/11/2001, 06/25/2013   Tdap 03/16/2011, 12/22/2021   Unspecified SARS-COV-2 Vaccination 05/31/2021   Zoster Recombinant(Shingrix) 09/12/2007, 09/02/2022   Pertinent  Health Maintenance Due  Topic Date Due   INFLUENZA VACCINE  04/12/2023   DEXA SCAN  Completed      01/28/2022    3:26 PM 02/15/2022   11:35 AM 04/07/2022   10:29 AM 08/10/2022    9:16 AM 09/19/2022   11:39 AM  Fall Risk  Falls in the past year?  1 1  0  Was there an injury with Fall?  1 1  0  Fall Risk Category Calculator  3 3  0  Fall Risk Category (Retired)  [employer]  Low  (RETIRED) Patient Fall Risk Level High fall risk High fall risk High fall risk High fall risk Low fall risk  Patient at Risk for Falls Due to  History of fall(s) History of fall(s)  No Fall Risks  Fall risk Follow up  Falls evaluation completed Falls evaluation  completed;Education provided  Falls evaluation completed     Vitals:   05/23/23 1431  BP: 126/81  Pulse: 73  Resp: (!) 24  Temp: 98.1 F (36.7 C)  SpO2: 90%  Weight: 159 lb 3.2 oz (72.2 kg)  Height: 4\' 10"  (1.473 m)   Body mass index is 33.27 kg/m.  Physical Exam Constitutional:      General: She is not in acute distress.    Appearance: She is obese.  HENT:     Head: Normocephalic and atraumatic.     Nose: Nose normal.     Mouth/Throat:     Mouth: Mucous membranes are moist.  Eyes:     Conjunctiva/sclera: Conjunctivae normal.  Cardiovascular:     Rate and Rhythm: Normal rate and regular rhythm.  Pulmonary:     Effort: Pulmonary effort is normal.     Breath sounds: Normal breath sounds.  Abdominal:     General: Bowel sounds are normal.     Palpations: Abdomen is soft.  Musculoskeletal:        General: Normal range of motion.     Cervical back: Normal range of motion.  Skin:    General: Skin is warm and dry.  Neurological:     Mental Status: She is alert. Mental status is at baseline.  Psychiatric:        Mood and Affect: Mood normal.        Behavior: Behavior normal.      Labs reviewed: Recent Labs    08/11/22 0000 09/12/22 0000  NA 138 141  K 3.9 3.9  CL 105 105  CO2 24* 25*  BUN 14 12  CREATININE 0.8 0.8  CALCIUM 9.4 8.9   Recent Labs    08/11/22 0000  AST 17  ALT 15  ALKPHOS 79  ALBUMIN 3.7   Recent Labs    08/11/22 0000  WBC 10.0  HGB 16.0  HCT 45  PLT 185   Lab Results  Component Value Date   TSH 0.26 (A) 04/04/2022   Lab Results  Component Value Date   HGBA1C 5.7 06/18/2018   Lab Results  Component Value Date   CHOL 188 01/30/2018   HDL 64 01/30/2018   LDLCALC 106 (H) 01/30/2018   TRIG 89 01/30/2018   CHOLHDL 2.9 01/30/2018    Significant Diagnostic Results in last 30 days:  No results found.  Assessment/Plan  1. COVID-19 virus infection -  will need to be on droplet isolation X 10 days -  nirmatrelvir/ritonavir (PAXLOVID) 20 x 150 MG & 10 x 100MG  TABS; Take 3 tablets by mouth 2 (two) times daily for 5 days. (Take nirmatrelvir 150 mg two tablets twice daily for 5 days and ritonavir 100 mg one tablet twice daily for 5 days) Patient GFR is 64 , 10/10/22  Dispense: 30 tablet; Refill: 0 - guaiFENesin (MUCINEX) 600 MG 12 hr tablet; Take 1 tablet (600 mg total) by mouth 2 (two) times daily for 7 days.  Dispense: 14 tablet; Refill: 0   Family/ staff Communication: Discussed plan of care with resident and charge nurse.  Labs/tests ordered:  None    Kenard Gower, DNP, MSN, FNP-BC [facility] [phone] (Monday-Friday 8:00 a.m. - 5:00 p.m.) [phone] (after hours)

## 2023-06-08 ENCOUNTER — Non-Acute Institutional Stay: Payer: Medicare (Managed Care) | Admitting: Nurse Practitioner

## 2023-06-08 ENCOUNTER — Encounter: Payer: Self-pay | Admitting: Nurse Practitioner

## 2023-06-08 DIAGNOSIS — R269 Unspecified abnormalities of gait and mobility: Secondary | ICD-10-CM

## 2023-06-08 DIAGNOSIS — F339 Major depressive disorder, recurrent, unspecified: Secondary | ICD-10-CM

## 2023-06-08 DIAGNOSIS — S0003XA Contusion of scalp, initial encounter: Secondary | ICD-10-CM | POA: Insufficient documentation

## 2023-06-08 DIAGNOSIS — E039 Hypothyroidism, unspecified: Secondary | ICD-10-CM

## 2023-06-08 DIAGNOSIS — I509 Heart failure, unspecified: Secondary | ICD-10-CM

## 2023-06-08 DIAGNOSIS — F0390 Unspecified dementia without behavioral disturbance: Secondary | ICD-10-CM

## 2023-06-08 DIAGNOSIS — W19XXXD Unspecified fall, subsequent encounter: Secondary | ICD-10-CM

## 2023-06-08 NOTE — Progress Notes (Signed)
Location:   [facility] Room Number: [phone] Place of Service:  ALF (13) Provider: Arna Snipe Aryeh Butterfield NP  Mahlon Gammon, MD  Patient Care Team: Mahlon Gammon, MD as PCP - General (Internal Medicine) Celeste Tavenner X, NP as Nurse Practitioner (Internal Medicine)  Extended Emergency Contact Information Primary Emergency Contact: Ransford,[name] Address: [address]          [facility] [place], [place] [address] Darden Amber of [place] Home Phone: [phone] Mobile Phone: [phone] Relation: Son Secondary Emergency Contact: Steger,Brett Address: [address]          [place], [place] [address] Darden Amber of [place] Home Phone: [phone] Mobile Phone: [phone] Relation: Son  Code Status: DNR Goals of care: Advanced Directive information    04/03/2023    3:31 PM  Advanced Directives  Does Patient Have a Medical Advance Directive? Yes  Type of Estate agent of [place];Living will;Out of facility DNR (pink MOST or yellow form)  Does patient want to make changes to medical advance directive? No - Patient declined  Copy of Healthcare Power of Attorney in Chart? Yes - validated most recent copy scanned in chart (See row information)  Pre-existing out of facility DNR order (yellow form or pink MOST form) Pink MOST form placed in chart (order not valid for inpatient use);Yellow form placed in chart (order not valid for inpatient use)     Chief Complaint  Patient presents with   Acute Visit    Fall, hit head.     HPI:  Pt is a 89 y.o. female seen today for an acute visit for on elevator with CNA, stepped back with walker and fell against the wall of elevator and sat on bottom with body against back wall, noted a hematoma left occiput, mild pain when palpated, no s/s of bleeding, no noted focal neurological symptoms.    09/29/22 CXR CHF, 10/01/22 CXR moderate CHF, on Furosemide daily, BNP 151, Bun/creat 14/0.84 10/10/22. 10/06/22 Echo EF 75%             Gait  abnormality, uses walker, risk for falling             Dementia, lack of safety awareness,  resides in [facility] for safety, care assistance, on Donepezil for memory. MMSE 19/30 11/02/22             Her mood is stabilizing, but still anxious at times, on Escitalopram, prn Lorazepam             Hypothyroidism, on Levothyroxine, TSH 4.69 10/10/22  Past Medical History:  Diagnosis Date   Eczema    Hypothyroid    Hypothyroidism    Hypothyroidism    Vitamin D deficiency    Past Surgical History:  Procedure Laterality Date   CHOLECYSTECTOMY N/A 06/24/2013   Procedure: LAPAROSCOPIC CHOLECYSTECTOMY WITH INTRAOPERATIVE CHOLANGIOGRAM;  Surgeon: Axel Filler, MD;  Location: [facility] OR;  Service: General;  Laterality: N/A;   TONSILLECTOMY      No Known Allergies  Allergies as of 06/08/2023   No Known Allergies      Medication List        Accurate as of June 08, 2023 11:09 AM. If you have any questions, ask your nurse or doctor.          acetaminophen 325 MG tablet Commonly known as: TYLENOL Take 650 mg by mouth every 4 (four) hours as needed for fever.   acetaminophen 325 MG tablet Commonly known as: TYLENOL Take 650 mg by  mouth every 4 (four) hours as needed for moderate pain.   AQUAPHOR OINTMENT BODY EX Apply to Back, Chest, BLE, BUE topically one time a day for Dry Skin;Itching;Rash related to DERMATITIS, UNSPECIFIED (L30.9) Apply Aquaphor Ointment to rashes on back chest BUE &BLE daily   aspirin EC 81 MG tablet Take 81 mg by mouth daily. Swallow whole.   calcium-vitamin D 500-200 MG-UNIT tablet Commonly known as: OSCAL WITH D Take 1 tablet by mouth 2 (two) times daily.   donepezil 5 MG tablet Commonly known as: ARICEPT Take 5 mg by mouth at bedtime.   escitalopram 20 MG tablet Commonly known as: LEXAPRO Take 20 mg by mouth daily.   furosemide 20 MG tablet Commonly known as: LASIX Take 20 mg by mouth daily.   lactose free nutrition Liqd Take by mouth daily.  nourishment give 1 bottle with 1 cup vanilla ice cream at 10 am   levothyroxine 88 MCG tablet Commonly known as: SYNTHROID Take 88 mcg by mouth daily. Mon, Tue, Wed, Thu, Fr   levothyroxine 100 MCG tablet Commonly known as: SYNTHROID Take 100 mcg by mouth. one time a day every Sat, Sun   Lidocaine & Camphor-Men Lotn 5 & 0.5-0.5 % Kit Apply topically. Apply to neck, abdomen,extremities topically two times a day for Itching   LORazepam 0.5 MG tablet Commonly known as: ATIVAN Take 0.5 mg by mouth every 8 (eight) hours as needed for anxiety. agitation   melatonin 1 MG Tabs tablet Take 1 mg by mouth at bedtime.   potassium chloride 10 MEQ CR capsule Commonly known as: MICRO-K Take 10 mEq by mouth daily.   PreviDent 5000 Booster Plus 1.1 % Pste Generic drug: Sodium Fluoride Place 1 application  onto teeth at bedtime. for oral care USE A PEA SIZE AMOUNT DURING NIGHTTIME OAL CARE. SPIT OUT ANY EXCESS. DO NOT RINSE. DO NOT EAT/DRINK  FOR 30 MINS AFTER        Review of Systems  Constitutional:  Negative for appetite change, fatigue and fever.  HENT:  Positive for hearing loss. Negative for congestion and voice change.   Eyes:  Negative for visual disturbance.  Respiratory:  Negative for cough, shortness of breath and wheezing.   Cardiovascular:  Negative for leg swelling.  Gastrointestinal:  Negative for abdominal pain and constipation.  Genitourinary:  Negative for dysuria, flank pain and urgency.  Musculoskeletal:  Positive for gait problem.  Skin:        Left occiput hematoma, no s/s of bleeding  Neurological:  Negative for speech difficulty, weakness and light-headedness.       Memory lapses.   Psychiatric/Behavioral:  Positive for confusion. Negative for sleep disturbance. The patient is nervous/anxious.     Immunization History  Administered Date(s) Administered   Fluad Quad(high Dose 65+) 07/03/2022   Influenza Whole 06/15/2018   Influenza,inj,Quad PF,6+ Mos  06/25/2013   Influenza-Unspecified 06/23/2020, 06/30/2021   Moderna Sars-Covid-2 Vaccination 09/13/2019, 10/11/2019, 07/20/2020, 02/08/2021   Pfizer Covid-19 Vaccine Bivalent Booster 7yrs & up 05/31/2021, 04/19/2022, 07/13/2022   Pneumococcal Conjugate-13 03/11/2014   Pneumococcal Polysaccharide-23 09/11/1986, 09/11/2001, 06/25/2013   Tdap 03/16/2011, 12/22/2021   Unspecified SARS-COV-2 Vaccination 05/31/2021   Zoster Recombinant(Shingrix) 09/12/2007, 09/02/2022   Pertinent  Health Maintenance Due  Topic Date Due   INFLUENZA VACCINE  04/12/2023   DEXA SCAN  Completed      01/28/2022    3:26 PM 02/15/2022   11:35 AM 04/07/2022   10:29 AM 08/10/2022    9:16 AM 09/19/2022  11:39 AM  Fall Risk  Falls in the past year?  1 1  0  Was there an injury with Fall?  1 1  0  Fall Risk Category Calculator  3 3  0  Fall Risk Category (Retired)  [employer]  Low  (RETIRED) Patient Fall Risk Level High fall risk High fall risk High fall risk High fall risk Low fall risk  Patient at Risk for Falls Due to  History of fall(s) History of fall(s)  No Fall Risks  Fall risk Follow up  Falls evaluation completed Falls evaluation completed;Education provided  Falls evaluation completed   Functional Status Survey:    Vitals:   06/08/23 1059  BP: 107/67  Pulse: 87  Resp: 17  Temp: (!) 97.5 F (36.4 C)  SpO2: 93%   There is no height or weight on file to calculate BMI. Physical Exam Vitals and nursing note reviewed.  Constitutional:      Appearance: Normal appearance.  HENT:     Head: Normocephalic and atraumatic.     Nose: No congestion.     Mouth/Throat:     Mouth: Mucous membranes are moist.  Eyes:     Extraocular Movements: Extraocular movements intact.     Conjunctiva/sclera: Conjunctivae normal.     Pupils: Pupils are equal, round, and reactive to light.  Cardiovascular:     Rate and Rhythm: Normal rate and regular rhythm.     Heart sounds: No murmur heard. Pulmonary:     Effort:  Pulmonary effort is normal.     Breath sounds: No rales.  Abdominal:     General: Bowel sounds are normal.     Palpations: Abdomen is soft.     Tenderness: There is no abdominal tenderness.     Hernia: A hernia is present.     Comments: Umbilical hernia.   Musculoskeletal:     Cervical back: Normal range of motion and neck supple.     Right lower leg: No edema.     Left lower leg: No edema.  Skin:    General: Skin is warm and dry.     Findings: Bruising present.     Comments: Left occiput hematoma, no s/s of bleeding  Neurological:     General: No focal deficit present.     Mental Status: She is alert. Mental status is at baseline.     Gait: Gait abnormal.     Comments: Oriented to person, place.   Psychiatric:        Mood and Affect: Mood normal.        Behavior: Behavior normal.     Labs reviewed: Recent Labs    08/11/22 0000 09/12/22 0000  NA 138 141  K 3.9 3.9  CL 105 105  CO2 24* 25*  BUN 14 12  CREATININE 0.8 0.8  CALCIUM 9.4 8.9   Recent Labs    08/11/22 0000  AST 17  ALT 15  ALKPHOS 79  ALBUMIN 3.7   Recent Labs    08/11/22 0000  WBC 10.0  HGB 16.0  HCT 45  PLT 185   Lab Results  Component Value Date   TSH 0.26 (A) 04/04/2022   Lab Results  Component Value Date   HGBA1C 5.7 06/18/2018   Lab Results  Component Value Date   CHOL 188 01/30/2018   HDL 64 01/30/2018   LDLCALC 106 (H) 01/30/2018   TRIG 89 01/30/2018   CHOLHDL 2.9 01/30/2018    Significant Diagnostic Results in last  30 days:  No results found.  Assessment/Plan: Scalp hematoma, initial encounter  on elevator with CNA, stepped back with walker and fell against the wall of elevator and sat on bottom with body against back wall, noted a hematoma left occiput, mild pain when palpated, no s/s of bleeding, no noted focal neurological symptoms. Continue to observe  Fall Frequent falls, unsteady gait, increased frailty, limited safety awareness are contributory, close  supervision, assistance for safety.   CHF (congestive heart failure) (HCC) 09/29/22 CXR CHF, 10/01/22 CXR moderate CHF, on Furosemide daily, BNP 151, Bun/creat 14/0.84 10/10/22. 10/06/22 Echo EF 75% Compensated clinically, update CBC/diff, CMP/eGFR, TSH>   Gait abnormality uses walker, risk for falling  Dementia without behavioral disturbance (HCC) lack of safety awareness,  resides in [facility] for safety, care assistance, on Donepezil for memory. MMSE 19/30 11/02/22  Depression, recurrent (HCC) Her mood is stabilizing, but still anxious at times, on Escitalopram, prn Lorazepam  Hypothyroidism on Levothyroxine, TSH 4.69 10/10/22    Family/ staff Communication: plan of care reviewed with the patient and charge nurse.   Labs/tests ordered:  CBC/diff, CMP/eGFR, TSH  Time spend 40 minutes.

## 2023-06-08 NOTE — Assessment & Plan Note (Signed)
Frequent falls, unsteady gait, increased frailty, limited safety awareness are contributory, close supervision, assistance for safety.

## 2023-06-08 NOTE — Assessment & Plan Note (Signed)
Her mood is stabilizing, but still anxious at times, on Escitalopram, prn Lorazepam 

## 2023-06-08 NOTE — Assessment & Plan Note (Signed)
uses walker, risk for falling

## 2023-06-08 NOTE — Assessment & Plan Note (Signed)
lack of safety awareness,  resides in [facility] for safety, care assistance, on Donepezil for memory. MMSE 19/30 11/02/22

## 2023-06-08 NOTE — Assessment & Plan Note (Signed)
on elevator with CNA, stepped back with walker and fell against the wall of elevator and sat on bottom with body against back wall, noted a hematoma left occiput, mild pain when palpated, no s/s of bleeding, no noted focal neurological symptoms. Continue to observe

## 2023-06-08 NOTE — Assessment & Plan Note (Signed)
09/29/22 CXR CHF, 10/01/22 CXR moderate CHF, on Furosemide daily, BNP 151, Bun/creat 14/0.84 10/10/22. 10/06/22 Echo EF 75% Compensated clinically, update CBC/diff, CMP/eGFR, TSH>

## 2023-06-08 NOTE — Assessment & Plan Note (Signed)
on Levothyroxine, TSH 4.69 10/10/22 

## 2023-06-12 ENCOUNTER — Encounter: Payer: Self-pay | Admitting: Nurse Practitioner

## 2023-06-12 LAB — CBC AND DIFFERENTIAL
HCT: 48 — AB (ref 36–46)
Hemoglobin: 15.6 (ref 12.0–16.0)
Neutrophils Absolute: 5004
Platelets: 182 10*3/uL (ref 150–400)
WBC: 9

## 2023-06-12 LAB — CBC: RBC: 4.79 (ref 3.87–5.11)

## 2023-06-12 LAB — BASIC METABOLIC PANEL
BUN: 14 (ref 4–21)
CO2: 27 — AB (ref 13–22)
Chloride: 103 (ref 99–108)
Creatinine: 0.9 (ref 0.5–1.1)
Glucose: 104
Potassium: 4 meq/L (ref 3.5–5.1)
Sodium: 139 (ref 137–147)

## 2023-06-12 LAB — HEPATIC FUNCTION PANEL
ALT: 19 U/L (ref 7–35)
AST: 18 (ref 13–35)
Alkaline Phosphatase: 70 (ref 25–125)
Bilirubin, Total: 1.2

## 2023-06-12 LAB — COMPREHENSIVE METABOLIC PANEL
Albumin: 3.6 (ref 3.5–5.0)
Calcium: 9.1 (ref 8.7–10.7)
Globulin: 2.7
eGFR: 59

## 2023-06-12 NOTE — Progress Notes (Signed)
This encounter was created in error - please disregard.

## 2023-06-19 DIAGNOSIS — L2081 Atopic neurodermatitis: Secondary | ICD-10-CM | POA: Diagnosis not present

## 2023-07-06 ENCOUNTER — Encounter: Payer: Self-pay | Admitting: Sports Medicine

## 2023-07-06 ENCOUNTER — Non-Acute Institutional Stay: Payer: Medicare (Managed Care) | Admitting: Sports Medicine

## 2023-07-06 DIAGNOSIS — F0390 Unspecified dementia without behavioral disturbance: Secondary | ICD-10-CM

## 2023-07-06 DIAGNOSIS — L309 Dermatitis, unspecified: Secondary | ICD-10-CM

## 2023-07-06 DIAGNOSIS — E039 Hypothyroidism, unspecified: Secondary | ICD-10-CM

## 2023-07-06 DIAGNOSIS — I509 Heart failure, unspecified: Secondary | ICD-10-CM

## 2023-07-06 DIAGNOSIS — F339 Major depressive disorder, recurrent, unspecified: Secondary | ICD-10-CM | POA: Diagnosis not present

## 2023-07-06 NOTE — Addendum Note (Signed)
Addended by: Venita Sheffield on: 07/06/2023 02:00 PM   Modules accepted: Level of Service

## 2023-07-06 NOTE — Progress Notes (Addendum)
Provider:  Venita Sheffield MD Location:   [facility]   Place of Service:   Assisted living   PCP: Mahlon Gammon, MD Patient Care Team: Mahlon Gammon, MD as PCP - General (Internal Medicine) Mast, Man X, NP as Nurse Practitioner (Internal Medicine)  Extended Emergency Contact Information Primary Emergency Contact: Leamer,[name] Address: [address]          [facility] [place], [place] [address] Darden [place] of [place] Home Phone: [phone] Mobile Phone: [phone] Relation: Son Secondary Emergency Contact: Panameno,Brett Address: [address]          [place], [place] [address] Darden Amber of [place] Home Phone: [phone] Mobile Phone: [phone] Relation: Son  Code Status:  Goals of Care: Advanced Directive information    04/03/2023    3:31 PM  Advanced Directives  Does Patient Have a Medical Advance Directive? Yes  Type of Estate agent of [place];Living will;Out of facility DNR (pink MOST or yellow form)  Does patient want to make changes to medical advance directive? No - Patient declined  Copy of Healthcare Power of Attorney in Chart? Yes - validated most recent copy scanned in chart (See row information)  Pre-existing out of facility DNR order (yellow form or pink MOST form) Pink MOST form placed in chart (order not valid for inpatient use);Yellow form placed in chart (order not valid for inpatient use)      No chief complaint on file.   HPI: Patient is a 89 y.o. female seen today for routine visit for chronic disease management. Pt seen and examined,daughter available for the visit  today. Pt seems pleasant and comfortable. She knows her name, cannot recognize her daughter. Pt is able to ambulate with a walker. She walks to dining hall to eat her meals.  No coughing or chocking while eating As per nursing staff she has required ativan only twice this month.  Pt does participates in some activities.  Had a fall 6  weeks ago  Poor appetite, weight stable  Eczema - no recent flare ups Doing better as per nursing staff  She is on aquaphor and triamcinolone cream  Past Medical History:  Diagnosis Date   Eczema    Hypothyroid    Hypothyroidism    Hypothyroidism    Vitamin D deficiency    Past Surgical History:  Procedure Laterality Date   CHOLECYSTECTOMY N/A 06/24/2013   Procedure: LAPAROSCOPIC CHOLECYSTECTOMY WITH INTRAOPERATIVE CHOLANGIOGRAM;  Surgeon: Axel Filler, MD;  Location: [facility] OR;  Service: General;  Laterality: N/A;   TONSILLECTOMY      reports that she has never smoked. She has never used smokeless tobacco. She reports that she does not drink alcohol and does not use drugs. Social History   Socioeconomic History   Marital status: Widowed    Spouse name: Not on file   Number of children: 3   Years of education: College   Highest education level: Not on file  Occupational History   Occupation: Retired  Tobacco Use   Smoking status: Never   Smokeless tobacco: Never  Vaping Use   Vaping status: Never Used  Substance and Sexual Activity   Alcohol use: No    Alcohol/week: 0.0 standard drinks of alcohol   Drug use: No   Sexual activity: Not on file  Other Topics Concern   Not on file  Social History Narrative   Tobacco use, amount per day now: None      Past tobacco use, amount per day: None  How many years did you use tobacco: Never      Alcohol use (drinks per week): None      Diet: 2 meals a day at [facility]      Do you drink/eat things with caffeine? Coffee      Marital status: Widowed             What year were you married? 1952      Do you live in a house, apartment, assisted living, condo, trailer? Apartment      Is it one or more stories? 1      How many persons live in your home?      Do you have any pets in your home? 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 Yes           How often?  Weekly and daily classes. Does walking on her own.      Do you have a living will? Yes      Do you have a DNR form?   Yes         If not, do you want to discuss one?      Do you have signed POA/HPOA forms? Yes              Social Determinants of Health   Financial Resource Strain: Low Risk  (10/02/2017)   Overall Financial Resource Strain (CARDIA)    Difficulty of Paying Living Expenses: Not hard at all  Food Insecurity: No Food Insecurity (10/02/2017)   Hunger Vital Sign    Worried About Running Out of Food in the Last Year: Never true    Ran Out of Food in the Last Year: Never true  Transportation Needs: No Transportation Needs (10/02/2017)   PRAPARE - Administrator, Civil Service (Medical): No    Lack of Transportation (Non-Medical): No  Physical Activity: Sufficiently Active (10/02/2017)   Exercise Vital Sign    Days of Exercise per Week: 7 days    Minutes of Exercise per Session: 30 min  Stress: No Stress Concern Present (10/02/2017)   [employer] of Occupational Health - Occupational Stress Questionnaire    Feeling of Stress : Not at all  Social Connections: Moderately Isolated (10/02/2017)   Social Connection and Isolation Panel [NHANES]    Frequency of Communication with Friends and Family: More than three times a week    Frequency of Social Gatherings with Friends and Family: More than three times a week    Attends Religious Services: Never    Database administrator or Organizations: No    Attends Banker Meetings: Never    Marital Status: Widowed  Intimate Partner Violence: Not At Risk (10/02/2017)   Humiliation, Afraid, Rape, and Kick questionnaire    Fear of Current or Ex-Partner: No    Emotionally Abused: No    Physically Abused: No    Sexually Abused: No    Functional Status Survey:    Family History  Problem Relation Age of Onset   Multiple myeloma Mother    Prostate cancer Father    Heart disease Father     Health  Maintenance  Topic Date Due   INFLUENZA VACCINE  04/12/2023   COVID-19 Vaccine (9 - 2023-24 season) 05/13/2023   Medicare Annual Wellness (AWV)  09/20/2023   DTaP/Tdap/Td (3 - Td or Tdap) 12/23/2031   Pneumonia Vaccine 65+  Years old  Completed   DEXA SCAN  Completed   Zoster Vaccines- Shingrix  Completed   HPV VACCINES  Aged Out    No Known Allergies  Outpatient Encounter Medications as of 07/06/2023  Medication Sig   acetaminophen (TYLENOL) 325 MG tablet Take 650 mg by mouth every 4 (four) hours as needed for fever.   acetaminophen (TYLENOL) 325 MG tablet Take 650 mg by mouth every 4 (four) hours as needed for moderate pain.   aspirin EC 81 MG tablet Take 81 mg by mouth daily. Swallow whole.   calcium-vitamin D (OSCAL WITH D) 500-200 MG-UNIT tablet Take 1 tablet by mouth 2 (two) times daily.   donepezil (ARICEPT) 5 MG tablet Take 5 mg by mouth at bedtime.   Emollient (AQUAPHOR OINTMENT BODY EX) Apply to Back, Chest, BLE, BUE topically one time a day for Dry Skin;Itching;Rash related to DERMATITIS, UNSPECIFIED (L30.9) Apply Aquaphor Ointment to rashes on back chest BUE &BLE daily   escitalopram (LEXAPRO) 20 MG tablet Take 20 mg by mouth daily.   furosemide (LASIX) 20 MG tablet Take 20 mg by mouth daily.   lactose free nutrition (BOOST) LIQD Take by mouth daily. nourishment give 1 bottle with 1 cup vanilla ice cream at 10 am   levothyroxine (SYNTHROID) 100 MCG tablet Take 100 mcg by mouth. one time a day every Sat, Sun   levothyroxine (SYNTHROID) 88 MCG tablet Take 88 mcg by mouth daily. Mon, Tue, Wed, Thu, Fr   Lidocaine & Camphor-Men Lotn 5 & 0.5-0.5 % KIT Apply topically. Apply to neck, abdomen,extremities topically two times a day for Itching   LORazepam (ATIVAN) 0.5 MG tablet Take 0.5 mg by mouth every 8 (eight) hours as needed for anxiety. agitation   melatonin 1 MG TABS tablet Take 1 mg by mouth at bedtime.   potassium chloride (MICRO-K) 10 MEQ CR capsule Take 10 mEq by mouth  daily.   Sodium Fluoride (PREVIDENT 5000 BOOSTER PLUS) 1.1 % PSTE Place 1 application  onto teeth at bedtime. for oral care USE A PEA SIZE AMOUNT DURING NIGHTTIME OAL CARE. SPIT OUT ANY EXCESS. DO NOT RINSE. DO NOT EAT/DRINK  FOR 30 MINS AFTER   No facility-administered encounter medications on file as of 07/06/2023.    Review of Systems  Unable to perform ROS: Dementia  Constitutional:  Negative for fever.  HENT:  Negative for sore throat.   Respiratory:  Negative for cough, shortness of breath and wheezing.   Cardiovascular:  Negative for chest pain, palpitations and leg swelling.  Gastrointestinal:  Negative for abdominal distention, abdominal pain, blood in stool, constipation, diarrhea, nausea and vomiting.  Genitourinary:  Negative for dysuria, frequency and urgency.  Neurological:  Negative for dizziness.  Psychiatric/Behavioral:  Positive for confusion.     There were no vitals filed for this visit. There is no height or weight on file to calculate BMI. Physical Exam Constitutional:      Appearance: Normal appearance.  HENT:     Head: Normocephalic and atraumatic.  Cardiovascular:     Rate and Rhythm: Normal rate and regular rhythm.  Pulmonary:     Effort: Pulmonary effort is normal. No respiratory distress.     Breath sounds: Normal breath sounds. No wheezing.  Abdominal:     General: Bowel sounds are normal. There is no distension.     Tenderness: There is no abdominal tenderness. There is no guarding or rebound.  Musculoskeletal:        General: No swelling or tenderness.  Neurological:     Mental Status: She is alert. Mental status is at baseline.     Sensory: No sensory deficit.     Motor: No weakness.     Labs reviewed: Basic Metabolic Panel: Recent Labs    08/11/22 0000 09/12/22 0000  NA 138 141  K 3.9 3.9  CL 105 105  CO2 24* 25*  BUN 14 12  CREATININE 0.8 0.8  CALCIUM 9.4 8.9   Liver Function Tests: Recent Labs    08/11/22 0000  AST 17   ALT 15  ALKPHOS 79  ALBUMIN 3.7   No results for input(s): "LIPASE", "AMYLASE" in the last 8760 hours. No results for input(s): "AMMONIA" in the last 8760 hours. CBC: Recent Labs    08/11/22 0000  WBC 10.0  HGB 16.0  HCT 45  PLT 185   Cardiac Enzymes: No results for input(s): "CKTOTAL", "CKMB", "CKMBINDEX", "TROPONINI" in the last 8760 hours. BNP: Invalid input(s): "POCBNP" Lab Results  Component Value Date   HGBA1C 5.7 06/18/2018   Lab Results  Component Value Date   TSH 0.26 (A) 04/04/2022   Lab Results  Component Value Date   VITAMINB12 352 10/28/2014   No results found for: "FOLATE" No results found for: "IRON", "TIBC", "FERRITIN"  Imaging and Procedures obtained prior to SNF admission: DG Chest 2 View  Result Date: 08/10/2022 CLINICAL DATA:  Fall EXAM: CHEST - 2 VIEW COMPARISON:  Chest x-ray dated June 15, 2021 FINDINGS: Unchanged cardiomegaly. Bibasilar atelectasis. Lungs otherwise clear. No evidence of pneumothorax. Old left-sided rib fractures IMPRESSION: Unchanged cardiomegaly.  Bibasilar atelectasis. Electronically Signed   By: Allegra Lai M.D.   On: 08/10/2022 10:16   DG Pelvis 1-2 Views  Result Date: 08/10/2022 CLINICAL DATA:  Fall. EXAM: PELVIS - 1-2 VIEW COMPARISON:  None Available. FINDINGS: There is diffuse decreased bone mineralization. Mild bilateral femoroacetabular joint space narrowing. Mild bilateral sacroiliac subchondral sclerosis without joint space narrowing. The pubic symphysis joint space is maintained. No definite acute fracture line is seen. IMPRESSION: 1. No acute fracture is seen. 2. Mild bilateral femoroacetabular osteoarthritis. Electronically Signed   By: Neita Garnet M.D.   On: 08/10/2022 10:13   CT Head Wo Contrast  Result Date: 08/10/2022 CLINICAL DATA:  Head trauma, minor (Age >= 65y); Neck trauma (Age >= 65y). Unwitnessed fall, scalp laceration. EXAM: CT HEAD WITHOUT CONTRAST CT CERVICAL SPINE WITHOUT CONTRAST  TECHNIQUE: Multidetector CT imaging of the head and cervical spine was performed following the standard protocol without intravenous contrast. Multiplanar CT image reconstructions of the cervical spine were also generated. RADIATION DOSE REDUCTION: This exam was performed according to the departmental dose-optimization program which includes automated exposure control, adjustment of the mA and/or kV according to patient size and/or use of iterative reconstruction technique. COMPARISON:  None Available. FINDINGS: CT HEAD FINDINGS Brain: Normal anatomic configuration. Parenchymal volume loss is commensurate with the patient's age. Stable moderate periventricular white matter changes are present likely reflecting the sequela of small vessel ischemia. Remote lacunar infarct noted within the right thalamus no abnormal intra or extra-axial mass lesion or fluid collection. No abnormal mass effect or midline shift. No evidence of acute intracranial hemorrhage or infarct. Ventricular size is normal. Cerebellum unremarkable. Vascular: No asymmetric hyperdense vasculature at the skull base. Skull: Intact Sinuses/Orbits: Paranasal sinuses are clear. Orbits are unremarkable. Other: Mastoid air cells and middle ear cavities are clear. Mild left occipital scalp soft tissue swelling. CT CERVICAL SPINE FINDINGS Alignment: 3 mm anterolisthesis C4-5 and 3 mm retrolisthesis  C5-6 are likely degenerative in nature. Otherwise normal cervical alignment. Skull base and vertebrae: Craniocervical alignment is normal. The atlantodental interval is not widened. No acute fracture of the cervical spine. Vertebral body height is preserved. There is ankylosis of the right C4-5 facet joint Soft tissues and spinal canal: No prevertebral fluid or swelling. No visible canal hematoma. Disc levels: There is intervertebral disc space narrowing and endplate remodeling at C5-6 and C6-7 in keeping with changes of advanced degenerative disc disease. Milder  degenerative changes are noted throughout the survey nerve the cervical spine. Prevertebral soft tissues are not thickened on sagittal reformats. No high-grade canal stenosis. Multilevel uncovertebral and facet arthrosis results in moderate to severe bilateral neuroforaminal narrowing at C3-C6., Upper chest: Negative. Other: None IMPRESSION: 1. No acute intracranial abnormality. No calvarial fracture. Mild left occipital scalp soft tissue swelling. 2. No acute fracture or listhesis of the cervical spine. Electronically Signed   By: Helyn Numbers M.D.   On: 08/10/2022 10:11   CT Cervical Spine Wo Contrast  Result Date: 08/10/2022 CLINICAL DATA:  Head trauma, minor (Age >= 65y); Neck trauma (Age >= 65y). Unwitnessed fall, scalp laceration. EXAM: CT HEAD WITHOUT CONTRAST CT CERVICAL SPINE WITHOUT CONTRAST TECHNIQUE: Multidetector CT imaging of the head and cervical spine was performed following the standard protocol without intravenous contrast. Multiplanar CT image reconstructions of the cervical spine were also generated. RADIATION DOSE REDUCTION: This exam was performed according to the departmental dose-optimization program which includes automated exposure control, adjustment of the mA and/or kV according to patient size and/or use of iterative reconstruction technique. COMPARISON:  None Available. FINDINGS: CT HEAD FINDINGS Brain: Normal anatomic configuration. Parenchymal volume loss is commensurate with the patient's age. Stable moderate periventricular white matter changes are present likely reflecting the sequela of small vessel ischemia. Remote lacunar infarct noted within the right thalamus no abnormal intra or extra-axial mass lesion or fluid collection. No abnormal mass effect or midline shift. No evidence of acute intracranial hemorrhage or infarct. Ventricular size is normal. Cerebellum unremarkable. Vascular: No asymmetric hyperdense vasculature at the skull base. Skull: Intact Sinuses/Orbits:  Paranasal sinuses are clear. Orbits are unremarkable. Other: Mastoid air cells and middle ear cavities are clear. Mild left occipital scalp soft tissue swelling. CT CERVICAL SPINE FINDINGS Alignment: 3 mm anterolisthesis C4-5 and 3 mm retrolisthesis C5-6 are likely degenerative in nature. Otherwise normal cervical alignment. Skull base and vertebrae: Craniocervical alignment is normal. The atlantodental interval is not widened. No acute fracture of the cervical spine. Vertebral body height is preserved. There is ankylosis of the right C4-5 facet joint Soft tissues and spinal canal: No prevertebral fluid or swelling. No visible canal hematoma. Disc levels: There is intervertebral disc space narrowing and endplate remodeling at C5-6 and C6-7 in keeping with changes of advanced degenerative disc disease. Milder degenerative changes are noted throughout the survey nerve the cervical spine. Prevertebral soft tissues are not thickened on sagittal reformats. No high-grade canal stenosis. Multilevel uncovertebral and facet arthrosis results in moderate to severe bilateral neuroforaminal narrowing at C3-C6., Upper chest: Negative. Other: None IMPRESSION: 1. No acute intracranial abnormality. No calvarial fracture. Mild left occipital scalp soft tissue swelling. 2. No acute fracture or listhesis of the cervical spine. Electronically Signed   By: Helyn Numbers M.D.   On: 08/10/2022 10:11    Assessment/Plan   1. Major neurocognitive disorder (HCC) No recent agitation as per nursing staff  Cont with aricept  Will monitor weight and falls Increase physical activity and cognitively engaging  activities  2. Depression, recurrent (HCC) Better Cont with lexapro  3. Dermatitis Instructed staff to apply aquaphor and triamcinolone  cream  4. Congestive heart failure, unspecified HF chronicity, unspecified heart failure type (HCC) Euvolemic on exam  Cont with lasix,  Cont with potassium supplements  5. Acquired  hypothyroidism  will check TSH  Cont with synthyroid     Family/ staff Communication: care plan discussed with the daughter, nurse  Labs/tests ordered: cbc, bmp, TSH   I spent greater than  40  minutes for the care of this patient in face to face time, chart review, clinical documentation, patient education.

## 2023-07-09 ENCOUNTER — Non-Acute Institutional Stay (INDEPENDENT_AMBULATORY_CARE_PROVIDER_SITE_OTHER): Payer: Medicare (Managed Care) | Admitting: Nurse Practitioner

## 2023-07-09 DIAGNOSIS — Z0000 Encounter for general adult medical examination without abnormal findings: Secondary | ICD-10-CM | POA: Diagnosis not present

## 2023-07-10 ENCOUNTER — Other Ambulatory Visit: Payer: Self-pay | Admitting: Nurse Practitioner

## 2023-07-10 ENCOUNTER — Encounter: Payer: Self-pay | Admitting: Nurse Practitioner

## 2023-07-10 DIAGNOSIS — M8580 Other specified disorders of bone density and structure, unspecified site: Secondary | ICD-10-CM

## 2023-07-10 LAB — BASIC METABOLIC PANEL
BUN: 16 (ref 4–21)
CO2: 30 — AB (ref 13–22)
Chloride: 103 (ref 99–108)
Creatinine: 0.9 (ref 0.5–1.1)
Glucose: 123
Potassium: 4.2 meq/L (ref 3.5–5.1)
Sodium: 141 (ref 137–147)

## 2023-07-10 LAB — CBC AND DIFFERENTIAL
HCT: 46 (ref 36–46)
Hemoglobin: 15.4 (ref 12.0–16.0)
Neutrophils Absolute: 4045
Platelets: 203 10*3/uL (ref 150–400)
WBC: 7.9

## 2023-07-10 LAB — CBC: RBC: 4.73 (ref 3.87–5.11)

## 2023-07-10 LAB — TSH: TSH: 1.41 (ref 0.41–5.90)

## 2023-07-10 LAB — COMPREHENSIVE METABOLIC PANEL: Calcium: 1.4 — AB (ref 8.7–10.7)

## 2023-07-10 NOTE — Progress Notes (Signed)
Subjective:   Lisa Barrera is a 89 y.o. female who presents for Medicare Annual (Subsequent) preventive examination.  Visit Complete: In person  Patient Medicare AWV questionnaire was completed by the patient on 07/09/23; I have confirmed that all information answered by patient is correct and no changes since this date.  Cardiac Risk Factors include: advanced age (>12men, >67 women);hypertension     Objective:    Today's Vitals   07/09/23 1429  BP: 125/65  Pulse: 86  Resp: 18  Temp: 98.4 F (36.9 C)  SpO2: 98%  Weight: 152 lb 9.6 oz (69.2 kg)   Body mass index is 31.89 kg/m.     04/03/2023    3:31 PM 03/20/2023   11:55 AM 02/14/2023    9:56 AM 12/22/2022   11:20 AM 09/19/2022   11:39 AM 09/12/2022    1:25 PM 06/12/2022   10:51 AM  Advanced Directives  Does Patient Have a Medical Advance Directive? Yes Yes Yes Yes Yes Yes Yes  Type of Estate agent of [place];Living will;Out of facility DNR (pink MOST or yellow form) Healthcare Power of [place];Living will;Out of facility DNR (pink MOST or yellow form) Healthcare Power of [place];Living will;Out of facility DNR (pink MOST or yellow form) Healthcare Power of [place];Living will;Out of facility DNR (pink MOST or yellow form) Healthcare Power of [place];Living will;Out of facility DNR (pink MOST or yellow form) Healthcare Power of [place];Living will;Out of facility DNR (pink MOST or yellow form) Healthcare Power of [place];Living will;Out of facility DNR (pink MOST or yellow form)  Does patient want to make changes to medical advance directive? No - Patient declined No - Patient declined No - Patient declined No - Patient declined No - Patient declined No - Patient declined No - Patient declined  Copy of Healthcare Power of Attorney in Chart? Yes - validated most recent copy scanned in chart (See row information) Yes - validated most recent copy scanned in chart (See row information) Yes - validated most  recent copy scanned in chart (See row information) Yes - validated most recent copy scanned in chart (See row information) Yes - validated most recent copy scanned in chart (See row information) Yes - validated most recent copy scanned in chart (See row information) Yes - validated most recent copy scanned in chart (See row information)  Pre-existing out of facility DNR order (yellow form or pink MOST form) Pink MOST form placed in chart (order not valid for inpatient use);Yellow form placed in chart (order not valid for inpatient use) Pink MOST/Yellow Form most recent copy in chart - Physician notified to receive inpatient order Pink MOST/Yellow Form most recent copy in chart - Physician notified to receive inpatient order Pink MOST/Yellow Form most recent copy in chart - Physician notified to receive inpatient order Pink MOST/Yellow Form most recent copy in chart - Physician notified to receive inpatient order Pink MOST/Yellow Form most recent copy in chart - Physician notified to receive inpatient order Pink Most/Yellow Form available - Physician notified to receive inpatient order;Yellow form placed in chart (order not valid for inpatient use)    Current Medications (verified) Outpatient Encounter Medications as of 07/09/2023  Medication Sig   acetaminophen (TYLENOL) 325 MG tablet Take 650 mg by mouth every 4 (four) hours as needed for fever.   acetaminophen (TYLENOL) 325 MG tablet Take 650 mg by mouth every 4 (four) hours as needed for moderate pain.   aspirin EC 81 MG tablet Take 81 mg by mouth daily.  Swallow whole.   calcium-vitamin D (OSCAL WITH D) 500-200 MG-UNIT tablet Take 1 tablet by mouth 2 (two) times daily.   donepezil (ARICEPT) 5 MG tablet Take 5 mg by mouth at bedtime.   Emollient (AQUAPHOR OINTMENT BODY EX) Apply to Back, Chest, BLE, BUE topically one time a day for Dry Skin;Itching;Rash related to DERMATITIS, UNSPECIFIED (L30.9) Apply Aquaphor Ointment to rashes on back chest BUE &BLE  daily   escitalopram (LEXAPRO) 20 MG tablet Take 20 mg by mouth daily.   furosemide (LASIX) 20 MG tablet Take 20 mg by mouth daily.   lactose free nutrition (BOOST) LIQD Take by mouth daily. nourishment give 1 bottle with 1 cup vanilla ice cream at 10 am   levothyroxine (SYNTHROID) 100 MCG tablet Take 100 mcg by mouth. one time a day every Sat, Sun   levothyroxine (SYNTHROID) 88 MCG tablet Take 88 mcg by mouth daily. Mon, Tue, Wed, Thu, Fr   Lidocaine & Camphor-Men Lotn 5 & 0.5-0.5 % KIT Apply topically. Apply to neck, abdomen,extremities topically two times a day for Itching   LORazepam (ATIVAN) 0.5 MG tablet Take 0.5 mg by mouth every 8 (eight) hours as needed for anxiety. agitation   melatonin 1 MG TABS tablet Take 1 mg by mouth at bedtime.   potassium chloride (MICRO-K) 10 MEQ CR capsule Take 10 mEq by mouth daily.   Sodium Fluoride (PREVIDENT 5000 BOOSTER PLUS) 1.1 % PSTE Place 1 application  onto teeth at bedtime. for oral care USE A PEA SIZE AMOUNT DURING NIGHTTIME OAL CARE. SPIT OUT ANY EXCESS. DO NOT RINSE. DO NOT EAT/DRINK  FOR 30 MINS AFTER   triamcinolone cream (KENALOG) 0.1 % 2 (two) times daily.   No facility-administered encounter medications on file as of 07/09/2023.    Allergies (verified) Patient has no known allergies.   History: Past Medical History:  Diagnosis Date   Eczema    Hypothyroid    Hypothyroidism    Hypothyroidism    Vitamin D deficiency    Past Surgical History:  Procedure Laterality Date   CHOLECYSTECTOMY N/A 06/24/2013   Procedure: LAPAROSCOPIC CHOLECYSTECTOMY WITH INTRAOPERATIVE CHOLANGIOGRAM;  Surgeon: Axel Filler, MD;  Location: [facility] OR;  Service: General;  Laterality: N/A;   TONSILLECTOMY     Family History  Problem Relation Age of Onset   Multiple myeloma Mother    Prostate cancer Father    Heart disease Father    Social History   Socioeconomic History   Marital status: Widowed    Spouse name: Not on file   Number of children: 3    Years of education: College   Highest education level: Not on file  Occupational History   Occupation: Retired  Tobacco Use   Smoking status: Never   Smokeless tobacco: Never  Vaping Use   Vaping status: Never Used  Substance and Sexual Activity   Alcohol use: No    Alcohol/week: 0.0 standard drinks of alcohol   Drug use: No   Sexual activity: Not on file  Other Topics Concern   Not on file  Social History Narrative   Tobacco use, amount per day now: None      Past tobacco use, amount per day: None      How many years did you use tobacco: Never      Alcohol use (drinks per week): None      Diet: 2 meals a day at [facility]      Do you drink/eat things with caffeine? Coffee  Marital status: Widowed             What year were you married? 1952      Do you live in a house, apartment, assisted living, condo, trailer? Apartment      Is it one or more stories? 1      How many persons live in your home?      Do you have any pets in your home? 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 Yes           How often? Weekly and daily classes. Does walking on her own.      Do you have a living will? Yes      Do you have a DNR form?   Yes         If not, do you want to discuss one?      Do you have signed POA/HPOA forms? Yes              Social Determinants of Health   Financial Resource Strain: Low Risk  (10/02/2017)   Overall Financial Resource Strain (CARDIA)    Difficulty of Paying Living Expenses: Not hard at all  Food Insecurity: No Food Insecurity (10/02/2017)   Hunger Vital Sign    Worried About Running Out of Food in the Last Year: Never true    Ran Out of Food in the Last Year: Never true  Transportation Needs: No Transportation Needs (10/02/2017)   PRAPARE - Administrator, Civil Service (Medical): No    Lack of Transportation (Non-Medical): No  Physical Activity: Sufficiently Active (10/02/2017)    Exercise Vital Sign    Days of Exercise per Week: 7 days    Minutes of Exercise per Session: 30 min  Stress: No Stress Concern Present (10/02/2017)   [employer] of Occupational Health - Occupational Stress Questionnaire    Feeling of Stress : Not at all  Social Connections: Moderately Isolated (10/02/2017)   Social Connection and Isolation Panel [NHANES]    Frequency of Communication with Friends and Family: More than three times a week    Frequency of Social Gatherings with Friends and Family: More than three times a week    Attends Religious Services: Never    Database administrator or Organizations: No    Attends Banker Meetings: Never    Marital Status: Widowed    Tobacco Counseling Counseling given: Not Answered   Clinical Intake:  Pre-visit preparation completed: Yes  Pain : No/denies pain     BMI - recorded: 31.89 Nutritional Status: BMI > 30  Obese Nutritional Risks: None Diabetes: No  How often do you need to have someone help you when you read instructions, pamphlets, or other written materials from your doctor or pharmacy?: 5 - Always What is the last grade level you completed in school?: college  Interpreter Needed?: No  Information entered by :: Manna Gose Nedra Hai NP   Activities of Daily Living    07/10/2023   11:42 AM 09/19/2022    4:26 PM  In your present state of health, do you have any difficulty performing the following activities:  Hearing? 1 1  Comment  hearing aids.  Vision? 1 0  Difficulty concentrating or making decisions? 1 1  Walking or climbing stairs? 1 1  Dressing or bathing? 1 1  Doing errands, shopping? 1 1  Preparing Food and  eating ? N N  Using the Toilet? N N  In the past six months, have you accidently leaked urine? Y Y  Do you have problems with loss of bowel control? N N  Managing your Medications? Y Y  Managing your Finances? Malvin Johns  Housekeeping or managing your Housekeeping? Malvin Johns    Patient Care  Team: Venita Sheffield, MD as PCP - General (Internal Medicine) Danelle Curiale X, NP as Nurse Practitioner (Internal Medicine)  Indicate any recent Medical Services you may have received from other than [facility] providers in the past year (date may be approximate).     Assessment:   This is a routine wellness examination for [place].  Hearing/Vision screen No results found.   Goals Addressed             This Visit's Progress    LIFESTYLE - DECREASE FALLS RISK         Depression Screen    07/10/2023   11:43 AM 10/02/2017    1:52 PM  PHQ 2/9 Scores  PHQ - 2 Score 0 0    Fall Risk    09/19/2022   11:39 AM 04/07/2022   10:29 AM 02/15/2022   11:35 AM 04/11/2019    1:09 PM 10/02/2017    1:52 PM  Fall Risk   Falls in the past year? 0 1 1 0 Yes  Comment    Emmi Telephone Survey: data to providers prior to load   Number falls in past yr: 0 1 1  1   Injury with Fall? 0 1 1  No  Risk for fall due to : No Fall Risks History of fall(s) History of fall(s)    Follow up Falls evaluation completed Falls evaluation completed;Education provided Falls evaluation completed      MEDICARE RISK AT HOME: Medicare Risk at Home Any stairs in or around the home?: Yes If so, are there any without handrails?: No Home free of loose throw rugs in walkways, pet beds, electrical cords, etc?: Yes Adequate lighting in your home to reduce risk of falls?: Yes Life alert?: No Use of a cane, walker or w/c?: Yes Grab bars in the bathroom?: Yes Shower chair or bench in shower?: Yes Elevated toilet seat or a handicapped toilet?: Yes  TIMED UP AND GO:  Was the test performed?  Yes  Length of time to ambulate 10 feet: 12 sec Gait slow and steady with assistive device    Cognitive Function:    09/05/2022   10:30 AM 08/27/2021    9:31 PM 03/19/2018    9:48 AM 06/29/2017    2:06 PM 06/26/2017   11:20 AM  MMSE - Mini Mental State Exam  Not completed:   --  --  Orientation to time 0 1 5 4 4    Orientation to Place 3 4 4 5 5   Registration 3 3 3 3 3   Attention/ Calculation 5 5 5 4 4   Recall 3 0 0 2 2  Language- name 2 objects 2 2 0 2 2  Language- repeat 1 1 1 1 1   Language- follow 3 step command 3 3 3 1 1   Language- read & follow direction 1 1 1 1 1   Write a sentence 1 1 1 1 1   Copy design 0 1 1 0 0  Total score 22 22 24 24 24         Immunizations Immunization History  Administered Date(s) Administered   Fluad Quad(high Dose 65+) 07/03/2022, 06/13/2023   Influenza Whole  06/15/2018   Influenza,inj,Quad PF,6+ Mos 06/25/2013   Influenza-Unspecified 06/23/2020, 06/30/2021   Moderna SARS-COV2 Booster Vaccination 06/27/2023   Moderna Sars-Covid-2 Vaccination 09/13/2019, 10/11/2019, 07/20/2020, 02/08/2021   Pfizer Covid-19 Vaccine Bivalent Booster 39yrs & up 05/31/2021, 04/19/2022, 07/13/2022   Pneumococcal Conjugate-13 03/11/2014   Pneumococcal Polysaccharide-23 09/11/1986, 09/11/2001, 06/25/2013   Tdap 03/16/2011, 12/22/2021   Unspecified SARS-COV-2 Vaccination 05/31/2021   Zoster Recombinant(Shingrix) 09/12/2007, 09/02/2022    TDAP status: Up to date  Flu Vaccine status: Up to date  Pneumococcal vaccine status: Up to date  Covid-19 vaccine status: Completed vaccines  Qualifies for Shingles Vaccine? Yes   Zostavax completed Yes   Shingrix Completed?: Yes  Screening Tests Health Maintenance  Topic Date Due   COVID-19 Vaccine (10 - 2023-24 season) 08/22/2023   Medicare Annual Wellness (AWV)  07/08/2024   DTaP/Tdap/Td (3 - Td or Tdap) 12/23/2031   Pneumonia Vaccine 1+ Years old  Completed   INFLUENZA VACCINE  Completed   DEXA SCAN  Completed   Zoster Vaccines- Shingrix  Completed   HPV VACCINES  Aged Out    Health Maintenance  There are no preventive care reminders to display for this patient.   Colorectal cancer screening: No longer required.   Mammogram status: No longer required due to aged out.  Bone Density status: Completed 02/23/11. Results  reflect: Bone density results: OSTEOPENIA. Repeat every 2 years.  Lung Cancer Screening: (Low Dose CT Chest recommended if Age 45-80 years, 20 pack-year currently smoking OR have quit w/in 15years.) does not qualify.     Additional Screening:  Hepatitis C Screening: does not qualify;   Vision Screening: Recommended annual ophthalmology exams for early detection of glaucoma and other disorders of the eye. Is the patient up to date with their annual eye exam?  No  Who is the provider or what is the name of the office in which the patient attends annual eye exams? HPOA will provide If pt is not established with a provider, would they like to be referred to a provider to establish care? No .   Dental Screening: Recommended annual dental exams for proper oral hygiene  Diabetic Foot Exam: NA  Community Resource Referral / Chronic Care Management: CRR required this visit?  No   CCM required this visit?  No     Plan:     I have personally reviewed and noted the following in the patient's chart:   Medical and social history Use of alcohol, tobacco or illicit drugs  Current medications and supplements including opioid prescriptions. Patient is not currently taking opioid prescriptions. Functional ability and status Nutritional status Physical activity Advanced directives List of other physicians Hospitalizations, surgeries, and ER visits in previous 12 months Vitals Screenings to include cognitive, depression, and falls Referrals and appointments  In addition, I have reviewed and discussed with patient certain preventive protocols, quality metrics, and best practice recommendations. A written personalized care plan for preventive services as well as general preventive health recommendations were provided to patient.     Kadon Andrus X Pancho Rushing, NP   07/10/2023   After Visit Summary: (In Person-Declined) Patient declined AVS at this time.

## 2023-07-13 ENCOUNTER — Encounter: Payer: Self-pay | Admitting: Sports Medicine

## 2023-07-13 ENCOUNTER — Non-Acute Institutional Stay (SKILLED_NURSING_FACILITY): Payer: Medicare (Managed Care) | Admitting: Sports Medicine

## 2023-07-13 DIAGNOSIS — F0150 Vascular dementia without behavioral disturbance: Secondary | ICD-10-CM

## 2023-07-13 DIAGNOSIS — R296 Repeated falls: Secondary | ICD-10-CM | POA: Diagnosis not present

## 2023-07-13 NOTE — Progress Notes (Unsigned)
Provider:  Oletta Lamas  Location:  [facility] Room Number: 821-A Place of Service:  ALF (13)  PCP: Venita Sheffield, MD Patient Care Team: Venita Sheffield, MD as PCP - General (Internal Medicine) Mast, Man X, NP as Nurse Practitioner (Internal Medicine)  Extended Emergency Contact Information Primary Emergency Contact: Demasi,[name] Address: [address] [place], [place] [address] [place] Home Phone: [phone] Mobile Phone: [phone] Relation: Son Secondary Emergency Contact: Sluder,Brett Address: [address]          [place], [place] [address] [place] Home Phone: [phone] Mobile Phone: [phone] Relation: Son  Code Status:  Goals of Care: Advanced Directive information    07/13/2023    3:26 PM  Advanced Directives  Does Patient Have a Medical Advance Directive? Yes  Type of Estate agent of [place];Living will;Out of facility DNR (pink MOST or yellow form)  Does patient want to make changes to medical advance directive? No - Patient declined  Copy of Healthcare Power of Attorney in Chart? Yes - validated most recent copy scanned in chart (See row information)      Chief Complaint  Patient presents with   Acute Visit    Fall    HPI: Patient is a 89 y.o. female seen today for acute visit for a recent fall  Pt had unwitnessed fall Pt at baseline has confusion and ambulates with a walker. Nurse saw her sitting in the doorway  Neuro checks initiated as per protocol Pt knows her name, not oriented to time or place  Does not remember about the fall  Denies pain and seems comfortable sitting in the living room Ambulating with a walker She went to dining hall to eat her meals this afternoon. No change in mentation as per nursing staff  Past Medical History:  Diagnosis Date   Eczema    Hypothyroid    Hypothyroidism    Hypothyroidism     Vitamin D deficiency    Past Surgical History:  Procedure Laterality Date   CHOLECYSTECTOMY N/A 06/24/2013   Procedure: LAPAROSCOPIC CHOLECYSTECTOMY WITH INTRAOPERATIVE CHOLANGIOGRAM;  Surgeon: Axel Filler, MD;  Location: [facility] OR;  Service: General;  Laterality: N/A;   TONSILLECTOMY      reports that she has never smoked. She has never used smokeless tobacco. She reports that she does not drink alcohol and does not use drugs. Social History   Socioeconomic History   Marital status: Widowed    Spouse name: Not on file   Number of children: 3   Years of education: College   Highest education level: Not on file  Occupational History   Occupation: Retired  Tobacco Use   Smoking status: Never   Smokeless tobacco: Never  Vaping Use   Vaping status: Never Used  Substance and Sexual Activity   Alcohol use: No    Alcohol/week: 0.0 standard drinks of alcohol   Drug use: No   Sexual activity: Not on file  Other Topics Concern   Not on file  Social History Narrative   Tobacco use, amount per day now: None      Past tobacco use, amount per day: None      How many years did you use tobacco: Never      Alcohol use (drinks per week): None      Diet: 2 meals a day at [facility]      Do you drink/eat things  with caffeine? Coffee      Marital status: Widowed             What year were you married? 1952      Do you live in a house, apartment, assisted living, condo, trailer? Apartment      Is it one or more stories? 1      How many persons live in your home?      Do you have any pets in your home? 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 Yes           How often? Weekly and daily classes. Does walking on her own.      Do you have a living will? Yes      Do you have a DNR form?   Yes         If not, do you want to discuss one?      Do you have signed POA/HPOA forms? Yes              Social Determinants of Health    Financial Resource Strain: Low Risk  (10/02/2017)   Overall Financial Resource Strain (CARDIA)    Difficulty of Paying Living Expenses: Not hard at all  Food Insecurity: No Food Insecurity (10/02/2017)   Hunger Vital Sign    Worried About Running Out of Food in the Last Year: Never true    Ran Out of Food in the Last Year: Never true  Transportation Needs: No Transportation Needs (10/02/2017)   PRAPARE - Administrator, Civil Service (Medical): No    Lack of Transportation (Non-Medical): No  Physical Activity: Sufficiently Active (10/02/2017)   Exercise Vital Sign    Days of Exercise per Week: 7 days    Minutes of Exercise per Session: 30 min  Stress: No Stress Concern Present (10/02/2017)   [employer] of Occupational Health - Occupational Stress Questionnaire    Feeling of Stress : Not at all  Social Connections: Moderately Isolated (10/02/2017)   Social Connection and Isolation Panel [NHANES]    Frequency of Communication with Friends and Family: More than three times a week    Frequency of Social Gatherings with Friends and Family: More than three times a week    Attends Religious Services: Never    Database administrator or Organizations: No    Attends Banker Meetings: Never    Marital Status: Widowed  Intimate Partner Violence: Not At Risk (10/02/2017)   Humiliation, Afraid, Rape, and Kick questionnaire    Fear of Current or Ex-Partner: No    Emotionally Abused: No    Physically Abused: No    Sexually Abused: No    Functional Status Survey:    Family History  Problem Relation Age of Onset   Multiple myeloma Mother    Prostate cancer Father    Heart disease Father     Health Maintenance  Topic Date Due   COVID-19 Vaccine (10 - 2023-24 season) 08/22/2023   Medicare Annual Wellness (AWV)  07/08/2024   DTaP/Tdap/Td (3 - Td or Tdap) 12/23/2031   Pneumonia Vaccine 40+ Years old  Completed   INFLUENZA VACCINE  Completed   DEXA SCAN   Completed   Zoster Vaccines- Shingrix  Completed   HPV VACCINES  Aged Out    No Known Allergies  Outpatient Encounter Medications as of 07/13/2023  Medication Sig  acetaminophen (TYLENOL) 325 MG tablet Take 650 mg by mouth every 4 (four) hours as needed for fever.   acetaminophen (TYLENOL) 325 MG tablet Take 650 mg by mouth every 4 (four) hours as needed for moderate pain.   aspirin EC 81 MG tablet Take 81 mg by mouth daily. Swallow whole.   calcium-vitamin D (OSCAL WITH D) 500-200 MG-UNIT tablet Take 1 tablet by mouth 2 (two) times daily.   Emollient (AQUAPHOR OINTMENT BODY EX) Apply to Back, Chest, BLE, BUE topically one time a day for Dry Skin;Itching;Rash related to DERMATITIS, UNSPECIFIED (L30.9) Apply Aquaphor Ointment to rashes on back chest BUE &BLE daily   escitalopram (LEXAPRO) 20 MG tablet Take 20 mg by mouth daily.   furosemide (LASIX) 20 MG tablet Take 20 mg by mouth daily.   lactose free nutrition (BOOST) LIQD Take by mouth daily. nourishment give 1 bottle with 1 cup vanilla ice cream at 10 am   levothyroxine (SYNTHROID) 100 MCG tablet Take 100 mcg by mouth. one time a day every Sat, Sun   levothyroxine (SYNTHROID) 88 MCG tablet Take 88 mcg by mouth daily. Mon, Tue, Wed, Thu, Fr   Lidocaine & Camphor-Men Lotn 5 & 0.5-0.5 % KIT Apply topically. Apply to neck, abdomen,extremities topically two times a day for Itching   LORazepam (ATIVAN) 0.5 MG tablet Take 0.5 mg by mouth every 8 (eight) hours as needed for anxiety. agitation   melatonin 1 MG TABS tablet Take 1 mg by mouth at bedtime.   potassium chloride (MICRO-K) 10 MEQ CR capsule Take 10 mEq by mouth daily.   Sodium Fluoride (PREVIDENT 5000 BOOSTER PLUS) 1.1 % PSTE Place 1 application  onto teeth at bedtime. for oral care USE A PEA SIZE AMOUNT DURING NIGHTTIME OAL CARE. SPIT OUT ANY EXCESS. DO NOT RINSE. DO NOT EAT/DRINK  FOR 30 MINS AFTER   triamcinolone cream (KENALOG) 0.1 % as needed.   [DISCONTINUED] donepezil (ARICEPT) 5  MG tablet Take 5 mg by mouth at bedtime.   No facility-administered encounter medications on file as of 07/13/2023.    Review of Systems  Unable to perform ROS: Dementia  Constitutional:  Negative for fever.  Respiratory:  Negative for cough, shortness of breath and wheezing.   Cardiovascular:  Negative for chest pain, palpitations and leg swelling.  Gastrointestinal:  Negative for abdominal distention, abdominal pain, blood in stool, constipation, diarrhea, nausea and vomiting.  Genitourinary:  Negative for dysuria, frequency and urgency.  Neurological:  Negative for dizziness, weakness and numbness.    Vitals:   07/13/23 1513  BP: 125/65  Pulse: 86  Resp: 18  Temp: 98.4 F (36.9 C)  SpO2: 98%  Weight: 157 lb 6.4 oz (71.4 kg)  Height: 4\' 10"  (1.473 m)   Body mass index is 32.9 kg/m. Physical Exam Constitutional:      Appearance: Normal appearance.  HENT:     Head: Normocephalic and atraumatic.  Cardiovascular:     Rate and Rhythm: Normal rate and regular rhythm.  Pulmonary:     Effort: Pulmonary effort is normal. No respiratory distress.     Breath sounds: Normal breath sounds. No wheezing.  Abdominal:     General: Bowel sounds are normal. There is no distension.     Tenderness: There is no abdominal tenderness. There is no guarding or rebound.  Musculoskeletal:        General: No swelling or tenderness.     Comments: Strength intact   Neurological:     Mental Status: She is  alert. Mental status is at baseline.     Labs reviewed: Basic Metabolic Panel: Recent Labs    08/11/22 0000 09/12/22 0000  NA 138 141  K 3.9 3.9  CL 105 105  CO2 24* 25*  BUN 14 12  CREATININE 0.8 0.8  CALCIUM 9.4 8.9   Liver Function Tests: Recent Labs    08/11/22 0000  AST 17  ALT 15  ALKPHOS 79  ALBUMIN 3.7   No results for input(s): "LIPASE", "AMYLASE" in the last 8760 hours. No results for input(s): "AMMONIA" in the last 8760 hours. CBC: Recent Labs     08/11/22 0000  WBC 10.0  HGB 16.0  HCT 45  PLT 185   Cardiac Enzymes: No results for input(s): "CKTOTAL", "CKMB", "CKMBINDEX", "TROPONINI" in the last 8760 hours. BNP: Invalid input(s): "POCBNP" Lab Results  Component Value Date   HGBA1C 5.7 06/18/2018   Lab Results  Component Value Date   TSH 0.26 (A) 04/04/2022   Lab Results  Component Value Date   VITAMINB12 352 10/28/2014   No results found for: "FOLATE" No results found for: "IRON", "TIBC", "FERRITIN"  Imaging and Procedures obtained prior to SNF admission: DG Chest 2 View  Result Date: 08/10/2022 CLINICAL DATA:  Fall EXAM: CHEST - 2 VIEW COMPARISON:  Chest x-ray dated June 15, 2021 FINDINGS: Unchanged cardiomegaly. Bibasilar atelectasis. Lungs otherwise clear. No evidence of pneumothorax. Old left-sided rib fractures IMPRESSION: Unchanged cardiomegaly.  Bibasilar atelectasis. Electronically Signed   By: Allegra Lai M.D.   On: 08/10/2022 10:16   DG Pelvis 1-2 Views  Result Date: 08/10/2022 CLINICAL DATA:  Fall. EXAM: PELVIS - 1-2 VIEW COMPARISON:  None Available. FINDINGS: There is diffuse decreased bone mineralization. Mild bilateral femoroacetabular joint space narrowing. Mild bilateral sacroiliac subchondral sclerosis without joint space narrowing. The pubic symphysis joint space is maintained. No definite acute fracture line is seen. IMPRESSION: 1. No acute fracture is seen. 2. Mild bilateral femoroacetabular osteoarthritis. Electronically Signed   By: Neita Garnet M.D.   On: 08/10/2022 10:13   CT Head Wo Contrast  Result Date: 08/10/2022 CLINICAL DATA:  Head trauma, minor (Age >= 65y); Neck trauma (Age >= 65y). Unwitnessed fall, scalp laceration. EXAM: CT HEAD WITHOUT CONTRAST CT CERVICAL SPINE WITHOUT CONTRAST TECHNIQUE: Multidetector CT imaging of the head and cervical spine was performed following the standard protocol without intravenous contrast. Multiplanar CT image reconstructions of the cervical spine  were also generated. RADIATION DOSE REDUCTION: This exam was performed according to the departmental dose-optimization program which includes automated exposure control, adjustment of the mA and/or kV according to patient size and/or use of iterative reconstruction technique. COMPARISON:  None Available. FINDINGS: CT HEAD FINDINGS Brain: Normal anatomic configuration. Parenchymal volume loss is commensurate with the patient's age. Stable moderate periventricular white matter changes are present likely reflecting the sequela of small vessel ischemia. Remote lacunar infarct noted within the right thalamus no abnormal intra or extra-axial mass lesion or fluid collection. No abnormal mass effect or midline shift. No evidence of acute intracranial hemorrhage or infarct. Ventricular size is normal. Cerebellum unremarkable. Vascular: No asymmetric hyperdense vasculature at the skull base. Skull: Intact Sinuses/Orbits: Paranasal sinuses are clear. Orbits are unremarkable. Other: Mastoid air cells and middle ear cavities are clear. Mild left occipital scalp soft tissue swelling. CT CERVICAL SPINE FINDINGS Alignment: 3 mm anterolisthesis C4-5 and 3 mm retrolisthesis C5-6 are likely degenerative in nature. Otherwise normal cervical alignment. Skull base and vertebrae: Craniocervical alignment is normal. The atlantodental interval is not widened.  No acute fracture of the cervical spine. Vertebral body height is preserved. There is ankylosis of the right C4-5 facet joint Soft tissues and spinal canal: No prevertebral fluid or swelling. No visible canal hematoma. Disc levels: There is intervertebral disc space narrowing and endplate remodeling at C5-6 and C6-7 in keeping with changes of advanced degenerative disc disease. Milder degenerative changes are noted throughout the survey nerve the cervical spine. Prevertebral soft tissues are not thickened on sagittal reformats. No high-grade canal stenosis. Multilevel uncovertebral and  facet arthrosis results in moderate to severe bilateral neuroforaminal narrowing at C3-C6., Upper chest: Negative. Other: None IMPRESSION: 1. No acute intracranial abnormality. No calvarial fracture. Mild left occipital scalp soft tissue swelling. 2. No acute fracture or listhesis of the cervical spine. Electronically Signed   By: Helyn Numbers M.D.   On: 08/10/2022 10:11   CT Cervical Spine Wo Contrast  Result Date: 08/10/2022 CLINICAL DATA:  Head trauma, minor (Age >= 65y); Neck trauma (Age >= 65y). Unwitnessed fall, scalp laceration. EXAM: CT HEAD WITHOUT CONTRAST CT CERVICAL SPINE WITHOUT CONTRAST TECHNIQUE: Multidetector CT imaging of the head and cervical spine was performed following the standard protocol without intravenous contrast. Multiplanar CT image reconstructions of the cervical spine were also generated. RADIATION DOSE REDUCTION: This exam was performed according to the departmental dose-optimization program which includes automated exposure control, adjustment of the mA and/or kV according to patient size and/or use of iterative reconstruction technique. COMPARISON:  None Available. FINDINGS: CT HEAD FINDINGS Brain: Normal anatomic configuration. Parenchymal volume loss is commensurate with the patient's age. Stable moderate periventricular white matter changes are present likely reflecting the sequela of small vessel ischemia. Remote lacunar infarct noted within the right thalamus no abnormal intra or extra-axial mass lesion or fluid collection. No abnormal mass effect or midline shift. No evidence of acute intracranial hemorrhage or infarct. Ventricular size is normal. Cerebellum unremarkable. Vascular: No asymmetric hyperdense vasculature at the skull base. Skull: Intact Sinuses/Orbits: Paranasal sinuses are clear. Orbits are unremarkable. Other: Mastoid air cells and middle ear cavities are clear. Mild left occipital scalp soft tissue swelling. CT CERVICAL SPINE FINDINGS Alignment: 3 mm  anterolisthesis C4-5 and 3 mm retrolisthesis C5-6 are likely degenerative in nature. Otherwise normal cervical alignment. Skull base and vertebrae: Craniocervical alignment is normal. The atlantodental interval is not widened. No acute fracture of the cervical spine. Vertebral body height is preserved. There is ankylosis of the right C4-5 facet joint Soft tissues and spinal canal: No prevertebral fluid or swelling. No visible canal hematoma. Disc levels: There is intervertebral disc space narrowing and endplate remodeling at C5-6 and C6-7 in keeping with changes of advanced degenerative disc disease. Milder degenerative changes are noted throughout the survey nerve the cervical spine. Prevertebral soft tissues are not thickened on sagittal reformats. No high-grade canal stenosis. Multilevel uncovertebral and facet arthrosis results in moderate to severe bilateral neuroforaminal narrowing at C3-C6., Upper chest: Negative. Other: None IMPRESSION: 1. No acute intracranial abnormality. No calvarial fracture. Mild left occipital scalp soft tissue swelling. 2. No acute fracture or listhesis of the cervical spine. Electronically Signed   By: Helyn Numbers M.D.   On: 08/10/2022 10:11    Assessment/Plan  1. Unwitnessed fall Pt had unwitnessed fall Does not seem to be in pain  Able to ambulate with a walker Cont with neuro checks as per protocol    2. Vascular dementia without behavioral disturbance (HCC) Cont with supportive care  Encourage patient to use walker all the time  Family/ staff Communication:  care plan discussed with the nursing staff  Labs/tests ordered: none

## 2023-07-16 ENCOUNTER — Encounter: Payer: Self-pay | Admitting: Sports Medicine

## 2023-07-24 ENCOUNTER — Non-Acute Institutional Stay: Payer: Medicare (Managed Care) | Admitting: Nurse Practitioner

## 2023-07-24 ENCOUNTER — Encounter: Payer: Self-pay | Admitting: Nurse Practitioner

## 2023-07-24 DIAGNOSIS — F0390 Unspecified dementia without behavioral disturbance: Secondary | ICD-10-CM | POA: Diagnosis not present

## 2023-07-24 DIAGNOSIS — Z66 Do not resuscitate: Secondary | ICD-10-CM

## 2023-07-24 DIAGNOSIS — R269 Unspecified abnormalities of gait and mobility: Secondary | ICD-10-CM

## 2023-07-24 DIAGNOSIS — F339 Major depressive disorder, recurrent, unspecified: Secondary | ICD-10-CM

## 2023-07-24 DIAGNOSIS — I509 Heart failure, unspecified: Secondary | ICD-10-CM | POA: Diagnosis not present

## 2023-07-24 NOTE — Progress Notes (Signed)
Location:  [facility] Room Number: 821-A Place of Service:  ALF [phone]) Provider:  Helen Hashimoto, MD  Patient Care Team: Venita Sheffield, MD as PCP - General (Internal Medicine) Lajean Boese X, NP as Nurse Practitioner (Internal Medicine)  Extended Emergency Contact Information Primary Emergency Contact: Erekson,[name] Address: [address]          [place], [place] [address] Darden Amber of [place] Home Phone: [phone] Mobile Phone: [phone] Relation: Son Secondary Emergency Contact: Carriero,Brett Address: [address]          [place], [place] [address] Darden Amber of [place] Home Phone: [phone] Mobile Phone: [phone] Relation: Son  Code Status:  DNR Goals of care: Advanced Directive information    07/24/2023    4:23 PM  Advanced Directives  Does Patient Have a Medical Advance Directive? Yes  Type of Estate agent of [place];Living will;Out of facility DNR (pink MOST or yellow form)  Does patient want to make changes to medical advance directive? No - Patient declined  Copy of Healthcare Power of Attorney in Chart? Yes - validated most recent copy scanned in chart (See row information)     Chief Complaint  Patient presents with  . Acute Visit    Worsening cognition    HPI:  Pt is a 89 y.o. female seen today for an acute visit for HPOA desires to restart [employer](stopped in Oct 2024) since the patient's memory declined rapidly, forgetting to use walker more frequently, high risk falling, fall and resulted in  hematoma left forehead 07/23/23.   09/29/22 CXR CHF, 10/01/22 CXR moderate CHF, on Furosemide daily, BNP 151, Bun/creat 16/0.9 07/10/23,  10/06/22 Echo EF 75%             Gait abnormality, uses walker, risk for falling             Dementia, lack of safety awareness,  resides in [facility] for [facility], care assistance, off Donepezil for memory since 06/2023, MMSE 19/30 11/02/22              Her mood is stabilizing, but still anxious at times, on Escitalopram, prn Lorazepam             Hypothyroidism, on Levothyroxine, TSH 1.41 07/10/23   Past Medical History:  Diagnosis Date  . Eczema   . Hypothyroid   . Hypothyroidism   . Hypothyroidism   . Vitamin D deficiency    Past Surgical History:  Procedure Laterality Date  . CHOLECYSTECTOMY N/A 06/24/2013   Procedure: LAPAROSCOPIC CHOLECYSTECTOMY WITH INTRAOPERATIVE CHOLANGIOGRAM;  Surgeon: Axel Filler, MD;  Location: [facility] OR;  Service: General;  Laterality: N/A;  . TONSILLECTOMY      No Known Allergies  Outpatient Encounter Medications as of 07/24/2023  Medication Sig  . acetaminophen (TYLENOL) 325 MG tablet Take 650 mg by mouth every 4 (four) hours as needed for fever.  Marland Kitchen acetaminophen (TYLENOL) 325 MG tablet Take 650 mg by mouth every 4 (four) hours as needed for moderate pain.  Marland Kitchen aspirin EC 81 MG tablet Take 81 mg by mouth daily. Swallow whole.  . calcium-vitamin D (OSCAL WITH D) 500-200 MG-UNIT tablet Take 1 tablet by mouth 2 (two) times daily.  . Emollient (AQUAPHOR OINTMENT BODY EX) Apply to Back, Chest, BLE, BUE topically one time a day for Dry Skin;Itching;Rash related to DERMATITIS, UNSPECIFIED (L30.9) Apply Aquaphor Ointment to rashes on back chest BUE &BLE daily  . escitalopram (LEXAPRO) 20 MG tablet Take 20 mg  by mouth daily.  . furosemide (LASIX) 20 MG tablet Take 20 mg by mouth daily.  Marland Kitchen lactose free nutrition (BOOST) LIQD Take by mouth daily. nourishment give 1 bottle with 1 cup vanilla ice cream at 10 am  . levothyroxine (SYNTHROID) 100 MCG tablet Take 100 mcg by mouth. one time a day every Sat, Sun  . levothyroxine (SYNTHROID) 88 MCG tablet Take 88 mcg by mouth daily. Mon, Tue, Wed, Thu, Fr  . Lidocaine & Camphor-Men Lotn 5 & 0.5-0.5 % KIT Apply topically. Apply to neck, abdomen,extremities topically two times a day for Itching  . LORazepam (ATIVAN) 0.5 MG tablet Take 0.5 mg by mouth every 8 (eight)  hours as needed for anxiety. agitation  . melatonin 1 MG TABS tablet Take 1 mg by mouth at bedtime.  . potassium chloride (MICRO-K) 10 MEQ CR capsule Take 10 mEq by mouth daily.  . Sodium Fluoride (PREVIDENT 5000 BOOSTER PLUS) 1.1 % PSTE Place 1 application  onto teeth at bedtime. for oral care USE A PEA SIZE AMOUNT DURING NIGHTTIME OAL CARE. SPIT OUT ANY EXCESS. DO NOT RINSE. DO NOT EAT/DRINK  FOR 30 MINS AFTER  . triamcinolone cream (KENALOG) 0.1 % as needed.   No facility-administered encounter medications on file as of 07/24/2023.    Review of Systems  Constitutional:  Negative for appetite change, fatigue and fever.  HENT:  Positive for hearing loss. Negative for congestion and voice change.   Eyes:  Negative for visual disturbance.  Respiratory:  Negative for cough, shortness of breath and wheezing.   Cardiovascular:  Negative for leg swelling.  Gastrointestinal:  Negative for abdominal pain and constipation.  Genitourinary:  Negative for dysuria, flank pain and urgency.  Musculoskeletal:  Positive for gait problem.  Skin:        Left forehead bruise.   Neurological:  Negative for speech difficulty, weakness and light-headedness.       Memory lapses.   Psychiatric/Behavioral:  Positive for confusion. Negative for sleep disturbance. The patient is nervous/anxious.     Immunization History  Administered Date(s) Administered  . Fluad Quad(high Dose 65+) 07/03/2022, 06/13/2023  . Influenza Whole 06/15/2018  . Influenza,inj,Quad PF,6+ Mos 06/25/2013  . Influenza-Unspecified 06/23/2020, 06/30/2021  . Moderna SARS-COV2 Booster Vaccination 06/27/2023  . Moderna Sars-Covid-2 Vaccination 09/13/2019, 10/11/2019, 07/20/2020, 02/08/2021  . 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 Pneumococcal Conjugate-13 03/11/2014  . Pneumococcal Polysaccharide-23 09/11/1986, 09/11/2001, 06/25/2013  . Tdap 03/16/2011, 12/22/2021  . Unspecified  SARS-COV-2 Vaccination 05/31/2021  . Zoster Recombinant(Shingrix) 09/12/2007, 09/02/2022   Pertinent  Health Maintenance Due  Topic Date Due  . INFLUENZA VACCINE  Completed  . DEXA SCAN  Completed      01/28/2022    3:26 PM 02/15/2022   11:35 AM 04/07/2022   10:29 AM 08/10/2022    9:16 AM 09/19/2022   11:39 AM  Fall Risk  Falls in the past year?  1 1  0  Was there an injury with Fall?  1 1  0  Fall Risk Category Calculator  3 3  0  Fall Risk Category (Retired)  [employer]  Low  (RETIRED) Patient Fall Risk Level High fall risk High fall risk High fall risk High fall risk Low fall risk  Patient at Risk for Falls Due to  History of fall(s) History of fall(s)  No Fall Risks  Fall risk Follow up  Falls evaluation completed Falls evaluation completed;Education provided  Falls evaluation completed   Functional  Status Survey:    Vitals:   07/24/23 1620 07/27/23 0948  BP: (!) 112/59 (!) 112/59  Pulse: 64   Resp: 10   Temp: (!) 97.3 F (36.3 C)   SpO2: 93%   Weight: 157 lb 6.4 oz (71.4 kg)   Height: 4\' 10"  (1.473 m)    Body mass index is 32.9 kg/m. Physical Exam Vitals and nursing note reviewed.  Constitutional:      Appearance: Normal appearance.  HENT:     Head: Normocephalic and atraumatic.     Nose: No congestion.     Mouth/Throat:     Mouth: Mucous membranes are moist.  Eyes:     Extraocular Movements: Extraocular movements intact.     Conjunctiva/sclera: Conjunctivae normal.     Pupils: Pupils are equal, round, and reactive to light.  Cardiovascular:     Rate and Rhythm: Normal rate and regular rhythm.     Heart sounds: No murmur heard. Pulmonary:     Effort: Pulmonary effort is normal.     Breath sounds: No rales.  Abdominal:     General: Bowel sounds are normal.     Palpations: Abdomen is soft.     Tenderness: There is no abdominal tenderness.     Hernia: A hernia is present.     Comments: Umbilical hernia.   Musculoskeletal:     Cervical back: Normal range of  motion and neck supple.     Right lower leg: No edema.     Left lower leg: No edema.  Skin:    General: Skin is warm and dry.     Findings: Bruising present.     Comments: Left forehead contusion.   Neurological:     General: No focal deficit present.     Mental Status: She is alert. Mental status is at baseline.     Gait: Gait abnormal.     Comments: Oriented to person  Psychiatric:        Mood and Affect: Mood normal.        Behavior: Behavior normal.    Labs reviewed: Recent Labs    09/12/22 0000 06/12/23 0000 07/10/23 0000  NA 141 139 141  K 3.9 4.0 4.2  CL 105 103 103  CO2 25* 27* 30*  BUN 12 14 16   CREATININE 0.8 0.9 0.9  CALCIUM 8.9 9.1 1.4*   Recent Labs    08/11/22 0000 06/12/23 0000  AST 17 18  ALT 15 19  ALKPHOS 79 70  ALBUMIN 3.7 3.6   Recent Labs    08/11/22 0000 06/12/23 0000 07/10/23 0000  WBC 10.0 9.0 7.9  NEUTROABS  --  5,004.00 4,045.00  HGB 16.0 15.6 15.4  HCT 45 48* 46  PLT 185 182 203   Lab Results  Component Value Date   TSH 1.41 07/10/2023   Lab Results  Component Value Date   HGBA1C 5.7 06/18/2018   Lab Results  Component Value Date   CHOL 188 01/30/2018   HDL 64 01/30/2018   LDLCALC 106 (H) 01/30/2018   TRIG 89 01/30/2018   CHOLHDL 2.9 01/30/2018    Significant Diagnostic Results in last 30 days:  No results found.  Assessment/Plan Dementia without behavioral disturbance [facility]) HPOA desires to restart [employer](stopped in Oct 2024) since the patient's memory declined rapidly, forgetting to use walker more frequently, high risk falling, fall and resulted in  hematoma left forehead 07/23/23.  CHF (congestive heart failure) (HCC) 09/29/22 CXR CHF, 10/01/22 CXR moderate CHF, on Furosemide daily,  BNP 151, Bun/creat 16/0.9 07/10/23,  10/06/22 Echo EF 75%  Gait abnormality  uses walker, risk for falling  Depression, recurrent (HCC) Her mood is stabilizing, but still anxious at times, on Escitalopram, prn  Lorazepam  Hypothyroidism  on Levothyroxine, TSH 1.41 07/10/23      Family/ staff Communication: plan of care reviewed with the patient and charge nurse.   Labs/tests ordered:  none  Time spend 40 minutes.

## 2023-07-25 ENCOUNTER — Encounter: Payer: Self-pay | Admitting: Nurse Practitioner

## 2023-07-25 NOTE — Assessment & Plan Note (Signed)
Her mood is stabilizing, but still anxious at times, on Escitalopram, prn Lorazepam 

## 2023-07-25 NOTE — Assessment & Plan Note (Signed)
uses walker, risk for falling

## 2023-07-25 NOTE — Assessment & Plan Note (Signed)
HPOA desires to restart [employer](stopped in Oct 2024) since the patient's memory declined rapidly, forgetting to use walker more frequently, high risk falling, fall and resulted in  hematoma left forehead 07/23/23.

## 2023-07-25 NOTE — Assessment & Plan Note (Signed)
09/29/22 CXR CHF, 10/01/22 CXR moderate CHF, on Furosemide daily, BNP 151, Bun/creat 16/0.9 07/10/23,  10/06/22 Echo EF 75%

## 2023-07-25 NOTE — Assessment & Plan Note (Signed)
on Levothyroxine, TSH 1.41 07/10/23

## 2023-07-30 ENCOUNTER — Telehealth: Payer: Medicare (Managed Care) | Admitting: Nurse Practitioner

## 2023-07-30 NOTE — Telephone Encounter (Signed)
Can the Bone density order be canceled?   This is the message that was on the order   [facility], PT FAMILY DECLINED EXAM

## 2023-08-02 NOTE — Telephone Encounter (Signed)
Orders have been D/C'd for this patient.

## 2023-08-17 ENCOUNTER — Encounter: Payer: Self-pay | Admitting: Sports Medicine

## 2023-08-17 ENCOUNTER — Non-Acute Institutional Stay (SKILLED_NURSING_FACILITY): Payer: Medicare (Managed Care) | Admitting: Sports Medicine

## 2023-08-17 DIAGNOSIS — E039 Hypothyroidism, unspecified: Secondary | ICD-10-CM | POA: Diagnosis not present

## 2023-08-17 DIAGNOSIS — Z9181 History of falling: Secondary | ICD-10-CM

## 2023-08-17 DIAGNOSIS — F0390 Unspecified dementia without behavioral disturbance: Secondary | ICD-10-CM

## 2023-08-17 DIAGNOSIS — I509 Heart failure, unspecified: Secondary | ICD-10-CM

## 2023-08-17 DIAGNOSIS — F339 Major depressive disorder, recurrent, unspecified: Secondary | ICD-10-CM

## 2023-08-17 NOTE — Progress Notes (Unsigned)
Provider:  Venita Sheffield , MD Location:  [facility] Room Number: [id number] Place of Service:  SNF (31)Skilled care   PCP: Venita Sheffield, MD Patient Care Team: Venita Sheffield, MD as PCP - General (Internal Medicine) Mast, Man X, NP as Nurse Practitioner (Internal Medicine)  Extended Emergency Contact Information Primary Emergency Contact: Akkerman,[name] Address: [address]          [facility] [place], [place] [address] Darden Amber of [place] Home Phone: [phone] Mobile Phone: [phone] Relation: Son Secondary Emergency Contact: Alvis,Brett Address: [address]          [place], [place] [address] Darden Amber of [place] Home Phone: [phone] Mobile Phone: [phone] Relation: Son  Code Status: DNR Goals of Care: Advanced Directive information    08/17/2023    3:19 PM  Advanced Directives  Does Patient Have a Medical Advance Directive? Yes  Type of Estate agent of [place];Living will;Out of facility DNR (pink MOST or yellow form)  Does patient want to make changes to medical advance directive? No - Patient declined  Copy of Healthcare Power of Attorney in Chart? Yes - validated most recent copy scanned in chart (See row information)  Pre-existing out of facility DNR order (yellow form or pink MOST form) Yellow form placed in chart (order not valid for inpatient use);Pink MOST form placed in chart (order not valid for inpatient use)      Chief Complaint  Patient presents with   New Admit To SNF    New admission    HPI: Patient is a 89 y.o. female seen today for  admission to skilled care from assisted living.  As per nurse pt was agitated yesterday but today she is doing better  Pt had recurrent falls, ambulates with a walker Needs 1 person assistance with her ADLS  Has baseline confusion   Pt seen and examined in the living room  She is sleeping in the recliner chair She opened her eyes on  calling her name Can tell me her name , could not remember that she had her lunch  Denies pain  Seems comfortable       Latest Ref Rng & Units 07/10/2023   12:00 AM 06/12/2023   12:00 AM 08/11/2022   12:00 AM  CBC  WBC  7.9     9.0     10.0      Hemoglobin 12.0 - 16.0 15.4     15.6     16.0      Hematocrit 36 - 46 46     48     45      Platelets 150 - 400 K/uL 203     182     185         This result is from an external source.        Latest Ref Rng & Units 07/10/2023   12:00 AM 06/12/2023   12:00 AM 09/12/2022   12:00 AM  BMP  BUN 4 - 21 16     14     12       Creatinine 0.5 - 1.1 0.9     0.9     0.8      Sodium 137 - 147 141     139     141      Potassium 3.5 - 5.1 mEq/L 4.2     4.0     3.9      Chloride 99 - 108 103  103     105      CO2 13 - 22 30     27     25       Calcium 8.7 - 10.7 1.4     9.1     8.9         This result is from an external source.       Past Medical History:  Diagnosis Date   Eczema    Hypothyroid    Hypothyroidism    Hypothyroidism    Vitamin D deficiency    Past Surgical History:  Procedure Laterality Date   CHOLECYSTECTOMY N/A 06/24/2013   Procedure: LAPAROSCOPIC CHOLECYSTECTOMY WITH INTRAOPERATIVE CHOLANGIOGRAM;  Surgeon: Axel Filler, MD;  Location: [facility] OR;  Service: General;  Laterality: N/A;   TONSILLECTOMY      reports that she has never smoked. She has never used smokeless tobacco. She reports that she does not drink alcohol and does not use drugs. Social History   Socioeconomic History   Marital status: Widowed    Spouse name: Not on file   Number of children: 3   Years of education: College   Highest education level: Not on file  Occupational History   Occupation: Retired  Tobacco Use   Smoking status: Never   Smokeless tobacco: Never  Vaping Use   Vaping status: Never Used  Substance and Sexual Activity   Alcohol use: No    Alcohol/week: 0.0 standard drinks of alcohol   Drug use: No   Sexual activity: Not on  file  Other Topics Concern   Not on file  Social History Narrative   Tobacco use, amount per day now: None      Past tobacco use, amount per day: None      How many years did you use tobacco: Never      Alcohol use (drinks per week): None      Diet: 2 meals a day at [facility]      Do you drink/eat things with caffeine? Coffee      Marital status: Widowed             What year were you married? 1952      Do you live in a house, apartment, assisted living, condo, trailer? Apartment      Is it one or more stories? 1      How many persons live in your home?      Do you have any pets in your home? 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 Yes           How often? Weekly and daily classes. Does walking on her own.      Do you have a living will? Yes      Do you have a DNR form?   Yes         If not, do you want to discuss one?      Do you have signed POA/HPOA forms? Yes              Social Determinants of Health   Financial Resource Strain: Low Risk  (10/02/2017)   Overall Financial Resource Strain (CARDIA)    Difficulty of Paying Living Expenses: Not hard at all  Food Insecurity: No Food Insecurity (10/02/2017)   Hunger Vital Sign    Worried About Running Out of Food in the Last Year: Never true  Ran Out of Food in the Last Year: Never true  Transportation Needs: No Transportation Needs (10/02/2017)   PRAPARE - Administrator, Civil Service (Medical): No    Lack of Transportation (Non-Medical): No  Physical Activity: Sufficiently Active (10/02/2017)   Exercise Vital Sign    Days of Exercise per Week: 7 days    Minutes of Exercise per Session: 30 min  Stress: No Stress Concern Present (10/02/2017)   [employer] of Occupational Health - Occupational Stress Questionnaire    Feeling of Stress : Not at all  Social Connections: Moderately Isolated (10/02/2017)   Social Connection and Isolation Panel  [NHANES]    Frequency of Communication with Friends and Family: More than three times a week    Frequency of Social Gatherings with Friends and Family: More than three times a week    Attends Religious Services: Never    Database administrator or Organizations: No    Attends Banker Meetings: Never    Marital Status: Widowed  Intimate Partner Violence: Not At Risk (10/02/2017)   Humiliation, Afraid, Rape, and Kick questionnaire    Fear of Current or Ex-Partner: No    Emotionally Abused: No    Physically Abused: No    Sexually Abused: No    Functional Status Survey:    Family History  Problem Relation Age of Onset   Multiple myeloma Mother    Prostate cancer Father    Heart disease Father     Health Maintenance  Topic Date Due   COVID-19 Vaccine (10 - 2023-24 season) 08/22/2023   Medicare Annual Wellness (AWV)  07/08/2024   DTaP/Tdap/Td (3 - Td or Tdap) 12/23/2031   Pneumonia Vaccine 64+ Years old  Completed   INFLUENZA VACCINE  Completed   DEXA SCAN  Completed   Zoster Vaccines- Shingrix  Completed   HPV VACCINES  Aged Out    No Known Allergies  Outpatient Encounter Medications as of 08/17/2023  Medication Sig   acetaminophen (TYLENOL) 325 MG tablet Take 650 mg by mouth every 4 (four) hours as needed for fever.   acetaminophen (TYLENOL) 325 MG tablet Take 650 mg by mouth every 4 (four) hours as needed for moderate pain.   aspirin EC 81 MG tablet Take 81 mg by mouth daily. Swallow whole.   calcium-vitamin D (OSCAL WITH D) 500-200 MG-UNIT tablet Take 1 tablet by mouth 2 (two) times daily.   donepezil (ARICEPT) 5 MG tablet Take 5 mg by mouth at bedtime.   Emollient (AQUAPHOR OINTMENT BODY EX) Apply to Back, Chest, BLE, BUE topically one time a day for Dry Skin;Itching;Rash related to DERMATITIS, UNSPECIFIED (L30.9) Apply Aquaphor Ointment to rashes on back chest BUE &BLE daily   escitalopram (LEXAPRO) 20 MG tablet Take 20 mg by mouth daily.   furosemide (LASIX)  20 MG tablet Take 20 mg by mouth daily.   lactose free nutrition (BOOST) LIQD Take by mouth daily. nourishment give 1 bottle with 1 cup vanilla ice cream at 10 am   levothyroxine (SYNTHROID) 100 MCG tablet Take 100 mcg by mouth. one time a day every Sat, Sun   levothyroxine (SYNTHROID) 88 MCG tablet Take 88 mcg by mouth daily. Mon, Tue, Wed, Thu, Fr   Lidocaine & Camphor-Men Lotn 5 & 0.5-0.5 % KIT Apply topically. Apply to neck, abdomen,extremities topically two times a day for Itching   LORazepam (ATIVAN) 0.5 MG tablet Take 0.5 mg by mouth every 8 (eight) hours as needed for anxiety. agitation  melatonin 1 MG TABS tablet Take 1 mg by mouth at bedtime.   potassium chloride (MICRO-K) 10 MEQ CR capsule Take 10 mEq by mouth daily.   Sodium Fluoride (PREVIDENT 5000 BOOSTER PLUS) 1.1 % PSTE Place 1 application  onto teeth at bedtime. for oral care USE A PEA SIZE AMOUNT DURING NIGHTTIME OAL CARE. SPIT OUT ANY EXCESS. DO NOT RINSE. DO NOT EAT/DRINK  FOR 30 MINS AFTER   triamcinolone cream (KENALOG) 0.1 % as needed.   No facility-administered encounter medications on file as of 08/17/2023.    Review of Systems  Unable to perform ROS: Dementia  Constitutional:  Negative for fever.  Respiratory:  Negative for cough and shortness of breath.   Cardiovascular:  Negative for chest pain and leg swelling.  Gastrointestinal:  Negative for abdominal distention, abdominal pain, blood in stool, constipation, diarrhea, nausea and vomiting.  Genitourinary:  Negative for dysuria, frequency and urgency.  Neurological:  Negative for dizziness.  Psychiatric/Behavioral:  Positive for behavioral problems and confusion.     Vitals:   08/17/23 1507  BP: 102/65  Pulse: (!) 56  Resp: 20  Temp: 97.6 F (36.4 C)  SpO2: (!) 88%  Weight: 155 lb 14.4 oz (70.7 kg)  Height: 5' (1.524 m)   Body mass index is 30.45 kg/m. Physical Exam Constitutional:      Appearance: Normal appearance.  HENT:     Head:  Normocephalic and atraumatic.  Cardiovascular:     Rate and Rhythm: Normal rate and regular rhythm.  Pulmonary:     Effort: Pulmonary effort is normal. No respiratory distress.     Breath sounds: Normal breath sounds. No wheezing.  Abdominal:     General: Bowel sounds are normal. There is no distension.     Tenderness: There is no abdominal tenderness. There is no guarding or rebound.     Comments:    Musculoskeletal:        General: No swelling or tenderness.  Neurological:     Mental Status: She is alert. Mental status is at baseline.     Sensory: No sensory deficit.     Motor: No weakness.     Labs reviewed: Basic Metabolic Panel: Recent Labs    09/12/22 0000 06/12/23 0000 07/10/23 0000  NA 141 139 141  K 3.9 4.0 4.2  CL 105 103 103  CO2 25* 27* 30*  BUN 12 14 16   CREATININE 0.8 0.9 0.9  CALCIUM 8.9 9.1 1.4*   Liver Function Tests: Recent Labs    06/12/23 0000  AST 18  ALT 19  ALKPHOS 70  ALBUMIN 3.6   No results for input(s): "LIPASE", "AMYLASE" in the last 8760 hours. No results for input(s): "AMMONIA" in the last 8760 hours. CBC: Recent Labs    06/12/23 0000 07/10/23 0000  WBC 9.0 7.9  NEUTROABS 5,004.00 4,045.00  HGB 15.6 15.4  HCT 48* 46  PLT 182 203   Cardiac Enzymes: No results for input(s): "CKTOTAL", "CKMB", "CKMBINDEX", "TROPONINI" in the last 8760 hours. BNP: Invalid input(s): "POCBNP" Lab Results  Component Value Date   HGBA1C 5.7 06/18/2018   Lab Results  Component Value Date   TSH 1.41 07/10/2023   Lab Results  Component Value Date   VITAMINB12 352 10/28/2014   No results found for: "FOLATE" No results found for: "IRON", "TIBC", "FERRITIN"  Imaging and Procedures obtained prior to SNF admission: DG Chest 2 View  Result Date: 08/10/2022 CLINICAL DATA:  Fall EXAM: CHEST - 2 VIEW COMPARISON:  Chest  x-ray dated June 15, 2021 FINDINGS: Unchanged cardiomegaly. Bibasilar atelectasis. Lungs otherwise clear. No evidence of  pneumothorax. Old left-sided rib fractures IMPRESSION: Unchanged cardiomegaly.  Bibasilar atelectasis. Electronically Signed   By: Allegra Lai M.D.   On: 08/10/2022 10:16   DG Pelvis 1-2 Views  Result Date: 08/10/2022 CLINICAL DATA:  Fall. EXAM: PELVIS - 1-2 VIEW COMPARISON:  None Available. FINDINGS: There is diffuse decreased bone mineralization. Mild bilateral femoroacetabular joint space narrowing. Mild bilateral sacroiliac subchondral sclerosis without joint space narrowing. The pubic symphysis joint space is maintained. No definite acute fracture line is seen. IMPRESSION: 1. No acute fracture is seen. 2. Mild bilateral femoroacetabular osteoarthritis. Electronically Signed   By: Neita Garnet M.D.   On: 08/10/2022 10:13   CT Head Wo Contrast  Result Date: 08/10/2022 CLINICAL DATA:  Head trauma, minor (Age >= 65y); Neck trauma (Age >= 65y). Unwitnessed fall, scalp laceration. EXAM: CT HEAD WITHOUT CONTRAST CT CERVICAL SPINE WITHOUT CONTRAST TECHNIQUE: Multidetector CT imaging of the head and cervical spine was performed following the standard protocol without intravenous contrast. Multiplanar CT image reconstructions of the cervical spine were also generated. RADIATION DOSE REDUCTION: This exam was performed according to the departmental dose-optimization program which includes automated exposure control, adjustment of the mA and/or kV according to patient size and/or use of iterative reconstruction technique. COMPARISON:  None Available. FINDINGS: CT HEAD FINDINGS Brain: Normal anatomic configuration. Parenchymal volume loss is commensurate with the patient's age. Stable moderate periventricular white matter changes are present likely reflecting the sequela of small vessel ischemia. Remote lacunar infarct noted within the right thalamus no abnormal intra or extra-axial mass lesion or fluid collection. No abnormal mass effect or midline shift. No evidence of acute intracranial hemorrhage or  infarct. Ventricular size is normal. Cerebellum unremarkable. Vascular: No asymmetric hyperdense vasculature at the skull base. Skull: Intact Sinuses/Orbits: Paranasal sinuses are clear. Orbits are unremarkable. Other: Mastoid air cells and middle ear cavities are clear. Mild left occipital scalp soft tissue swelling. CT CERVICAL SPINE FINDINGS Alignment: 3 mm anterolisthesis C4-5 and 3 mm retrolisthesis C5-6 are likely degenerative in nature. Otherwise normal cervical alignment. Skull base and vertebrae: Craniocervical alignment is normal. The atlantodental interval is not widened. No acute fracture of the cervical spine. Vertebral body height is preserved. There is ankylosis of the right C4-5 facet joint Soft tissues and spinal canal: No prevertebral fluid or swelling. No visible canal hematoma. Disc levels: There is intervertebral disc space narrowing and endplate remodeling at C5-6 and C6-7 in keeping with changes of advanced degenerative disc disease. Milder degenerative changes are noted throughout the survey nerve the cervical spine. Prevertebral soft tissues are not thickened on sagittal reformats. No high-grade canal stenosis. Multilevel uncovertebral and facet arthrosis results in moderate to severe bilateral neuroforaminal narrowing at C3-C6., Upper chest: Negative. Other: None IMPRESSION: 1. No acute intracranial abnormality. No calvarial fracture. Mild left occipital scalp soft tissue swelling. 2. No acute fracture or listhesis of the cervical spine. Electronically Signed   By: Helyn Numbers M.D.   On: 08/10/2022 10:11   CT Cervical Spine Wo Contrast  Result Date: 08/10/2022 CLINICAL DATA:  Head trauma, minor (Age >= 65y); Neck trauma (Age >= 65y). Unwitnessed fall, scalp laceration. EXAM: CT HEAD WITHOUT CONTRAST CT CERVICAL SPINE WITHOUT CONTRAST TECHNIQUE: Multidetector CT imaging of the head and cervical spine was performed following the standard protocol without intravenous contrast.  Multiplanar CT image reconstructions of the cervical spine were also generated. RADIATION DOSE REDUCTION: This exam was performed  according to the departmental dose-optimization program which includes automated exposure control, adjustment of the mA and/or kV according to patient size and/or use of iterative reconstruction technique. COMPARISON:  None Available. FINDINGS: CT HEAD FINDINGS Brain: Normal anatomic configuration. Parenchymal volume loss is commensurate with the patient's age. Stable moderate periventricular white matter changes are present likely reflecting the sequela of small vessel ischemia. Remote lacunar infarct noted within the right thalamus no abnormal intra or extra-axial mass lesion or fluid collection. No abnormal mass effect or midline shift. No evidence of acute intracranial hemorrhage or infarct. Ventricular size is normal. Cerebellum unremarkable. Vascular: No asymmetric hyperdense vasculature at the skull base. Skull: Intact Sinuses/Orbits: Paranasal sinuses are clear. Orbits are unremarkable. Other: Mastoid air cells and middle ear cavities are clear. Mild left occipital scalp soft tissue swelling. CT CERVICAL SPINE FINDINGS Alignment: 3 mm anterolisthesis C4-5 and 3 mm retrolisthesis C5-6 are likely degenerative in nature. Otherwise normal cervical alignment. Skull base and vertebrae: Craniocervical alignment is normal. The atlantodental interval is not widened. No acute fracture of the cervical spine. Vertebral body height is preserved. There is ankylosis of the right C4-5 facet joint Soft tissues and spinal canal: No prevertebral fluid or swelling. No visible canal hematoma. Disc levels: There is intervertebral disc space narrowing and endplate remodeling at C5-6 and C6-7 in keeping with changes of advanced degenerative disc disease. Milder degenerative changes are noted throughout the survey nerve the cervical spine. Prevertebral soft tissues are not thickened on sagittal reformats.  No high-grade canal stenosis. Multilevel uncovertebral and facet arthrosis results in moderate to severe bilateral neuroforaminal narrowing at C3-C6., Upper chest: Negative. Other: None IMPRESSION: 1. No acute intracranial abnormality. No calvarial fracture. Mild left occipital scalp soft tissue swelling. 2. No acute fracture or listhesis of the cervical spine. Electronically Signed   By: Helyn Numbers M.D.   On: 08/10/2022 10:11    Assessment/Plan  1. Major neurocognitive disorder (HCC) Will stop aricept due to recurrent falls Cont with supportive care Increase physical activity and cognitively engaging activities  2. At risk for falling Bed alarm in place Bed in lowest position  Supervision and enforcement to use walker at all times   3. Acquired hypothyroidism   Lab Results  Component Value Date   TSH 1.41 07/10/2023   Cont with synthyroid  4. Congestive heart failure, unspecified HF chronicity, unspecified heart failure type (HCC) Cont with lasix, potassium supplements  5. Depression, recurrent (HCC) Pt moved to skilled care from assisted living  Will monitor for delirium and worsening agitation  Cont with lexapro, lorazepam prn     Family/ staff Communication:  care plan discussed with the nursing staff  Labs/tests ordered: none   35 Total time spent for obtaining history,  performing a medically appropriate examination and evaluation, reviewing the tests,ordering  tests,  documenting clinical information in the electronic or other health record, independently interpreting results ,care coordination (not separately reported)

## 2023-08-31 ENCOUNTER — Encounter: Payer: Self-pay | Admitting: Sports Medicine

## 2023-08-31 NOTE — Progress Notes (Unsigned)
  This encounter was created in error - please disregard.

## 2023-09-10 ENCOUNTER — Emergency Department (HOSPITAL_COMMUNITY)
Admission: EM | Admit: 2023-09-10 | Discharge: 2023-09-10 | Disposition: A | Discharge status: Home / Self Care | Payer: Medicare (Managed Care) | Attending: Emergency Medicine | Admitting: Emergency Medicine

## 2023-09-10 ENCOUNTER — Non-Acute Institutional Stay (SKILLED_NURSING_FACILITY): Payer: Medicare (Managed Care) | Admitting: Sports Medicine

## 2023-09-10 ENCOUNTER — Other Ambulatory Visit: Payer: Self-pay

## 2023-09-10 ENCOUNTER — Encounter: Payer: Self-pay | Admitting: Sports Medicine

## 2023-09-10 DIAGNOSIS — I10 Essential (primary) hypertension: Secondary | ICD-10-CM | POA: Diagnosis not present

## 2023-09-10 DIAGNOSIS — R0902 Hypoxemia: Secondary | ICD-10-CM | POA: Diagnosis not present

## 2023-09-10 DIAGNOSIS — F0390 Unspecified dementia without behavioral disturbance: Secondary | ICD-10-CM | POA: Insufficient documentation

## 2023-09-10 DIAGNOSIS — J111 Influenza due to unidentified influenza virus with other respiratory manifestations: Secondary | ICD-10-CM

## 2023-09-10 DIAGNOSIS — S0101XA Laceration without foreign body of scalp, initial encounter: Secondary | ICD-10-CM | POA: Insufficient documentation

## 2023-09-10 DIAGNOSIS — W19XXXA Unspecified fall, initial encounter: Secondary | ICD-10-CM | POA: Diagnosis not present

## 2023-09-10 DIAGNOSIS — Z7982 Long term (current) use of aspirin: Secondary | ICD-10-CM | POA: Diagnosis not present

## 2023-09-10 DIAGNOSIS — S0990XA Unspecified injury of head, initial encounter: Secondary | ICD-10-CM | POA: Diagnosis present

## 2023-09-10 DIAGNOSIS — I959 Hypotension, unspecified: Secondary | ICD-10-CM | POA: Diagnosis not present

## 2023-09-10 DIAGNOSIS — M549 Dorsalgia, unspecified: Secondary | ICD-10-CM | POA: Diagnosis not present

## 2023-09-10 NOTE — ED Triage Notes (Addendum)
[facility], hit back of head causing unk size lac per ems d/t dried blood at site, "soft spot noted" tachypnea and hypoxia 84% which is abnormal, baseline dementia   Pt flu +

## 2023-09-10 NOTE — ED Provider Notes (Signed)
[place] EMERGENCY DEPARTMENT AT [facility] Provider Note   CSN: [id number] Arrival date & time: 09/10/23  1816     History  Chief Complaint  Patient presents with   Lisa Barrera    Lisa Barrera is a 89 y.o. female w/ advanced dementia, DNR/DNI, presenting by EMS with a fall and head laceration.  Per outpatient external records and EMS report, pt tested positive for influenza this week.  Started on tamiflu by PCP.  Patient status present at the bedside reports her mental status is at baseline.  Patient son expresses concern that the patient "really does not do well" in unfamiliar environments, tends to get extremely agitated and hospitals.  His family member is preferring a very minimalist workup, states that the patient has had borderline hypoxia and low oxygen levels into the 80s for "about a year".  She does not and will not wear oxygen.  He reports that she was eating fine today, he says he wants to drink an entire Ensure, and he does not feel that she is dehydrated.  He does identify is her power of attorney and legal guardian  HPI     Home Medications Prior to Admission medications   Medication Sig Start Date End Date Taking? Authorizing Provider  acetaminophen (TYLENOL) 325 MG tablet Take 650 mg by mouth every 4 (four) hours as needed for fever.    [provider]  aspirin EC 81 MG tablet Take 81 mg by mouth daily. Swallow whole.    [provider]  benzonatate (TESSALON) 100 MG capsule Take 200 mg by mouth 2 (two) times daily.    [provider]  calcium-vitamin D (OSCAL WITH D) 500-200 MG-UNIT tablet Take 1 tablet by mouth 2 (two) times daily.    [provider]  donepezil (ARICEPT) 5 MG tablet Take 5 mg by mouth at bedtime. Patient not taking: Reported on 08/31/2023    [provider]  Emollient (AQUAPHOR OINTMENT BODY EX) Apply to Back, Chest, BLE, BUE topically one time a day for Dry Skin;Itching;Rash related to  DERMATITIS, UNSPECIFIED (L30.9) Apply Aquaphor Ointment to rashes on back chest BUE &BLE daily    [provider]  escitalopram (LEXAPRO) 20 MG tablet Take 20 mg by mouth daily.    [provider]  furosemide (LASIX) 20 MG tablet Take 20 mg by mouth daily. Patient not taking: Reported on 09/11/2023    [provider]  lactose free nutrition (BOOST) LIQD Take by mouth daily. nourishment give 1 bottle with 1 cup vanilla ice cream at 10 am Patient not taking: Reported on 09/11/2023    [provider]  levothyroxine (SYNTHROID) 100 MCG tablet Take 100 mcg by mouth. one time a day every Sat, Sun    [provider]  levothyroxine (SYNTHROID) 88 MCG tablet Take 88 mcg by mouth daily. Mon, Tue, Wed, Thu, Fr    [provider]  Lidocaine & Camphor-Men Lotn 5 & 0.5-0.5 % KIT Apply topically. Apply to neck, abdomen,extremities topically two times a day for Itching    [provider]  loratadine (CLARITIN) 10 MG tablet Take 10 mg by mouth daily.    [provider]  LORazepam (ATIVAN) 0.5 MG tablet Take 0.5 mg by mouth every 8 (eight) hours as needed for anxiety. Agitation.  Give 1 tablet by mouth every 8 hours as needed for agitation for 14 Days    [provider]  melatonin 1 MG TABS tablet Take 1 mg by mouth at  bedtime.    [provider]  oseltamivir (TAMIFLU) 75 MG capsule Take 75 mg by mouth 2 (two) times daily.    [provider]  potassium chloride (MICRO-K) 10 MEQ CR capsule Take 10 mEq by mouth daily.    [provider]  Sodium Fluoride (PREVIDENT 5000 BOOSTER PLUS) 1.1 % PSTE Place 1 application  onto teeth at bedtime. for oral care USE A PEA SIZE AMOUNT DURING NIGHTTIME OAL CARE. SPIT OUT ANY EXCESS. DO NOT RINSE. DO NOT EAT/DRINK  FOR 30 MINS AFTER    [provider]  triamcinolone cream (KENALOG) 0.1 % as needed. 06/19/23   [provider]      Allergies    Patient has no  known allergies.    Review of Systems   Review of Systems  Physical Exam Updated Vital Signs BP (!) 132/57 (BP Location: Right Arm)   Pulse 60   Temp 98.9 F (37.2 C) (Oral)   Resp 16   SpO2 93%  Physical Exam Constitutional:      General: She is not in acute distress. HENT:     Head: Normocephalic.     Comments: 1 cm laceration over the occiput without active bleeding, surrounding maceration Eyes:     Conjunctiva/sclera: Conjunctivae normal.     Pupils: Pupils are equal, round, and reactive to light.  Cardiovascular:     Rate and Rhythm: Normal rate and regular rhythm.  Pulmonary:     Effort: Pulmonary effort is normal. No respiratory distress.  Abdominal:     General: There is no distension.     Tenderness: There is no abdominal tenderness.  Skin:    General: Skin is warm and dry.  Neurological:     General: No focal deficit present.     Mental Status: She is alert. Mental status is at baseline.     ED Results / Procedures / Treatments   Labs (all labs ordered are listed, but only abnormal results are displayed) Labs Reviewed - No data to display  EKG None  Radiology No results found.  Procedures .Laceration Repair  Date/Time: 09/11/2023 2:34 PM  Performed by: Terald Sleeper, MD Authorized by: Terald Sleeper, MD   Consent:    Consent obtained:  Verbal   Consent given by:  Guardian   Risks, benefits, and alternatives were discussed: yes     Risks discussed:  Poor cosmetic result and pain Universal protocol:    Procedure explained and questions answered to patient or proxy's satisfaction: yes     Relevant documents present and verified: yes     Test results available: yes     Imaging studies available: yes     Required blood products, implants, devices, and special equipment available: yes     Site/side marked: yes     Immediately prior to procedure, a time out was called: yes     Patient identity confirmed:  Arm band Anesthesia:     Anesthesia method:  None Laceration details:    Location:  Scalp   Scalp location:  Occipital   Length (cm):  2 Treatment:    Area cleansed with:  Saline   Amount of cleaning:  Standard   Irrigation solution:  Sterile saline Skin repair:    Repair method:  Staples   Number of staples:  2 Approximation:    Approximation:  Close Repair type:    Repair type:  Simple Post-procedure details:    Dressing:  Open (no dressing)   Procedure  completion:  Tolerated well, no immediate complications     Medications Ordered in ED Medications - No data to display  ED Course/ Medical Decision Making/ A&P                                 Medical Decision Making  Patient is here with a reported mechanical fall and has a small laceration on the back of her scalp.  Agreeable to irrigating cleaned this and closed this using 2 staples, which her facility can remove in 10 days.  Patient was recently diagnosed with influenza.  He discussed this with her son.  At her age, with her dementia and frailty, there is a high risk morbidity mortality, including worsening hypoxia, dehydration, weakness and falls.  However, her goals of care with her advanced dementia per my discussion with her son is to minimize hospitalization and prolonged stay in the ER is much as possible.  He understands that her condition may worsen over the next several days, at which point he may be asked to consider hospice and end of life care or return to the hospital.  He understands this.  He does not want CT imaging at this time.  He understands there is a risk of potential brain bleed but does not feel that she would be a good operative candidate, which I agree with given her clinical status, and would shift towards palliative care.    I reviewed her external records.  She was evaluated by her provider at the facility today he was started her on Tamiflu, and ordered blood counts and a chest x-ray.  Her son did prefer to have this  workup done again at her facility where she is more comfortable.  Given these goals of care we will discontinue any further workup in the ED, the patient will be transferred back to her facility.  I did reach out to [facility] at 7 pm and left a secure voicemail at the nursing station but have not heard back from their facility.  Written discharge instructions will be provided.        Final Clinical Impression(s) / ED Diagnoses Final diagnoses:  Laceration of scalp, initial encounter    Rx / DC Orders ED Discharge Orders     None         Merrianne Mccumbers, Kermit Balo, MD 09/11/23 1435

## 2023-09-10 NOTE — Discharge Instructions (Addendum)
Lisa Barrera had 2 staples placed in a 1 cm laceration over her back of her scalp.  These staples need to be removed in 10 days ([name] 8th or 9th).  She can shower normally.  The wound may ooze a little bit for the next 24 hours, but the bleeding should stop.  I had a goals of care discussion with her son, who is her medical decision maker, here in the emergency department.  He preferred to minimize any workup and time spent in the emergency department or hospital.  The patient is DNR/DNI with an emphasis on palliative care and minimal hospital visits.   He deferred CT imaging for head trauma and blood testing for dehydration.  Instead we agreed to give her 500 cc IV fluids.  He would prefer all testing to be done through [facility].  He feels that the patient gets very agitated and delirious the longer she is out of her familiar environment.  He understands that [name] is acutely ill with influenza.  This may affect her breathing, may lead to dehydration, weakness, and death.  I would recommend a goals of care discussion and consideration of hospice services if this is the case.    He also understands her oxygen level abnormally low.  In the hospital today, it was 85-93%.  He reports she has had chronic hypoxia, and that she does not wear oxygen voluntarily, and therefore would prefer no further workup from that standpoint.    I attempted to reach [facility] and left a voicemail with RN staff but have not heard back.  Thank you, Alvester Chou, MD Emergency Department

## 2023-09-10 NOTE — Progress Notes (Signed)
Provider:  Venita Sheffield MD [facility] Room Number: 2-B Place of Service:  SNF (31)Skilled care   PCP: Venita Sheffield, MD Patient Care Team: Venita Sheffield, MD as PCP - General (Internal Medicine) Mast, Man X, NP as Nurse Practitioner (Internal Medicine)  Extended Emergency Contact Information Primary Emergency Contact: Kernes,[name] Address: [address]          [facility] [place], [place] [address] Darden Amber of [place] Home Phone: [phone] Mobile Phone: [phone] Relation: Son Secondary Emergency Contact: Dieckman,Brett Address: [address]          [place], [place] [address] Darden Amber of [place] Home Phone: [phone] Mobile Phone: [phone] Relation: Son  Code Status:  Goals of Care: Advanced Directive information    09/10/2023    4:48 PM  Advanced Directives  Does Patient Have a Medical Advance Directive? Yes  Type of Estate agent of [place];Living will;Out of facility DNR (pink MOST or yellow form)  Does patient want to make changes to medical advance directive? No - Patient declined  Copy of Healthcare Power of Attorney in Chart? Yes - validated most recent copy scanned in chart (See row information)  Pre-existing out of facility DNR order (yellow form or pink MOST form) Pink MOST/Yellow Form most recent copy in chart - Physician notified to receive inpatient order      Chief Complaint  Patient presents with   Acute Visit    Flu    HPI: Patient is a 89 y.o. female seen today for acute visit for flu positive As per nursing staff pt is having for few days. Tested negative for covid Pt seen and examined in her room, she is laying on her bed,  She is not  able to comprehend  She is a poor historian and majority of the history is obtained from the nursing staff  Staff reports that pt is talking to her self . Did not eat much for lunch   Past Medical  History:  Diagnosis Date   Eczema    Hypothyroid    Hypothyroidism    Hypothyroidism    Vitamin D deficiency    Past Surgical History:  Procedure Laterality Date   CHOLECYSTECTOMY N/A 06/24/2013   Procedure: LAPAROSCOPIC CHOLECYSTECTOMY WITH INTRAOPERATIVE CHOLANGIOGRAM;  Surgeon: Axel Filler, MD;  Location: [facility] OR;  Service: General;  Laterality: N/A;   TONSILLECTOMY      reports that she has never smoked. She has never used smokeless tobacco. She reports that she does not drink alcohol and does not use drugs. Social History   Socioeconomic History   Marital status: Widowed    Spouse name: Not on file   Number of children: 3   Years of education: College   Highest education level: Not on file  Occupational History   Occupation: Retired  Tobacco Use   Smoking status: Never   Smokeless tobacco: Never  Vaping Use   Vaping status: Never Used  Substance and Sexual Activity   Alcohol use: No    Alcohol/week: 0.0 standard drinks of alcohol   Drug use: No   Sexual activity: Not on file  Other Topics Concern   Not on file  Social History Narrative   Tobacco use, amount per day now: None      Past tobacco use, amount per day: None      How many years did you use tobacco: Never      Alcohol use (drinks per week): None  Diet: 2 meals a day at [facility]      Do you drink/eat things with caffeine? Coffee      Marital status: Widowed             What year were you married? 1952      Do you live in a house, apartment, assisted living, condo, trailer? Apartment      Is it one or more stories? 1      How many persons live in your home?      Do you have any pets in your home? 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 Yes           How often? Weekly and daily classes. Does walking on her own.      Do you have a living will? Yes      Do you have a DNR form?   Yes         If not, do you want to discuss one?       Do you have signed POA/HPOA forms? Yes              Social Drivers of Corporate investment banker Strain: Low Risk  (10/02/2017)   Overall Financial Resource Strain (CARDIA)    Difficulty of Paying Living Expenses: Not hard at all  Food Insecurity: No Food Insecurity (10/02/2017)   Hunger Vital Sign    Worried About Running Out of Food in the Last Year: Never true    Ran Out of Food in the Last Year: Never true  Transportation Needs: No Transportation Needs (10/02/2017)   PRAPARE - Administrator, Civil Service (Medical): No    Lack of Transportation (Non-Medical): No  Physical Activity: Sufficiently Active (10/02/2017)   Exercise Vital Sign    Days of Exercise per Week: 7 days    Minutes of Exercise per Session: 30 min  Stress: No Stress Concern Present (10/02/2017)   [employer] of Occupational Health - Occupational Stress Questionnaire    Feeling of Stress : Not at all  Social Connections: Moderately Isolated (10/02/2017)   Social Connection and Isolation Panel [NHANES]    Frequency of Communication with Friends and Family: More than three times a week    Frequency of Social Gatherings with Friends and Family: More than three times a week    Attends Religious Services: Never    Database administrator or Organizations: No    Attends Banker Meetings: Never    Marital Status: Widowed  Intimate Partner Violence: Not At Risk (10/02/2017)   Humiliation, Afraid, Rape, and Kick questionnaire    Fear of Current or Ex-Partner: No    Emotionally Abused: No    Physically Abused: No    Sexually Abused: No    Functional Status Survey:    Family History  Problem Relation Age of Onset   Multiple myeloma Mother    Prostate cancer Father    Heart disease Father     Health Maintenance  Topic Date Due   COVID-19 Vaccine (10 - 2024-25 season) 08/22/2023   Medicare Annual Wellness (AWV)  07/08/2024   DTaP/Tdap/Td (3 - Td or Tdap) 12/23/2031   Pneumonia  Vaccine 27+ Years old  Completed   INFLUENZA VACCINE  Completed   DEXA SCAN  Completed   Zoster Vaccines- Shingrix  Completed   HPV VACCINES  Aged Out  No Known Allergies  Outpatient Encounter Medications as of 09/10/2023  Medication Sig   acetaminophen (TYLENOL) 325 MG tablet Take 650 mg by mouth every 4 (four) hours as needed for fever.   aspirin EC 81 MG tablet Take 81 mg by mouth daily. Swallow whole.   benzonatate (TESSALON) 100 MG capsule Take 200 mg by mouth 2 (two) times daily.   calcium-vitamin D (OSCAL WITH D) 500-200 MG-UNIT tablet Take 1 tablet by mouth 2 (two) times daily.   Emollient (AQUAPHOR OINTMENT BODY EX) Apply to Back, Chest, BLE, BUE topically one time a day for Dry Skin;Itching;Rash related to DERMATITIS, UNSPECIFIED (L30.9) Apply Aquaphor Ointment to rashes on back chest BUE &BLE daily   escitalopram (LEXAPRO) 20 MG tablet Take 20 mg by mouth daily.   furosemide (LASIX) 20 MG tablet Take 20 mg by mouth daily.   levothyroxine (SYNTHROID) 100 MCG tablet Take 100 mcg by mouth. one time a day every Sat, Sun   levothyroxine (SYNTHROID) 88 MCG tablet Take 88 mcg by mouth daily. Mon, Tue, Wed, Thu, Fr   Lidocaine & Camphor-Men Lotn 5 & 0.5-0.5 % KIT Apply topically. Apply to neck, abdomen,extremities topically two times a day for Itching   LORazepam (ATIVAN) 0.5 MG tablet Take 0.5 mg by mouth every 8 (eight) hours as needed for anxiety. Agitation.  Give 1 tablet by mouth every 8 hours as needed for agitation for 14 Days   melatonin 1 MG TABS tablet Take 1 mg by mouth at bedtime.   oseltamivir (TAMIFLU) 75 MG capsule Take 75 mg by mouth 2 (two) times daily.   potassium chloride (MICRO-K) 10 MEQ CR capsule Take 10 mEq by mouth daily.   Sodium Fluoride (PREVIDENT 5000 BOOSTER PLUS) 1.1 % PSTE Place 1 application  onto teeth at bedtime. for oral care USE A PEA SIZE AMOUNT DURING NIGHTTIME OAL CARE. SPIT OUT ANY EXCESS. DO NOT RINSE. DO NOT EAT/DRINK  FOR 30 MINS AFTER    triamcinolone cream (KENALOG) 0.1 % as needed.   acetaminophen (TYLENOL) 325 MG tablet Take 650 mg by mouth every 4 (four) hours as needed for moderate pain. (Patient not taking: Reported on 09/10/2023)   donepezil (ARICEPT) 5 MG tablet Take 5 mg by mouth at bedtime. (Patient not taking: Reported on 09/10/2023)   lactose free nutrition (BOOST) LIQD Take by mouth daily. nourishment give 1 bottle with 1 cup vanilla ice cream at 10 am (Patient not taking: Reported on 09/10/2023)   No facility-administered encounter medications on file as of 09/10/2023.    Review of Systems  Unable to perform ROS: Dementia  Constitutional:  Negative for fever.  Respiratory:  Positive for cough. Negative for shortness of breath.   Cardiovascular:  Negative for chest pain, palpitations and leg swelling.  Gastrointestinal:  Negative for abdominal distention, abdominal pain, blood in stool, constipation, diarrhea, nausea and vomiting.  Genitourinary:  Negative for dysuria, frequency and urgency.  Neurological:  Negative for dizziness, weakness and numbness.  Psychiatric/Behavioral:  Positive for confusion and hallucinations.     Vitals:   09/10/23 1554  BP: (!) 118/57  Pulse: 61  Resp: 20  Temp: 97.6 F (36.4 C)  SpO2: (!) 88%  Weight: 156 lb 6.4 oz (70.9 kg)  Height: 5' (1.524 m)   Body mass index is 30.54 kg/m. Physical Exam Constitutional:      Appearance: Normal appearance.  Cardiovascular:     Rate and Rhythm: Normal rate and regular rhythm.  Pulmonary:     Effort: Pulmonary  effort is normal. No respiratory distress.     Breath sounds: Normal breath sounds. No wheezing.  Abdominal:     General: Bowel sounds are normal. There is no distension.     Tenderness: There is no abdominal tenderness. There is no guarding or rebound.     Comments:    Neurological:     Mental Status: She is alert. Mental status is at baseline.     Labs reviewed: Basic Metabolic Panel: Recent Labs     09/12/22 0000 06/12/23 0000 07/10/23 0000  NA 141 139 141  K 3.9 4.0 4.2  CL 105 103 103  CO2 25* 27* 30*  BUN 12 14 16   CREATININE 0.8 0.9 0.9  CALCIUM 8.9 9.1 1.4*   Liver Function Tests: Recent Labs    06/12/23 0000  AST 18  ALT 19  ALKPHOS 70  ALBUMIN 3.6   No results for input(s): "LIPASE", "AMYLASE" in the last 8760 hours. No results for input(s): "AMMONIA" in the last 8760 hours. CBC: Recent Labs    06/12/23 0000 07/10/23 0000  WBC 9.0 7.9  NEUTROABS 5,004.00 4,045.00  HGB 15.6 15.4  HCT 48* 46  PLT 182 203   Cardiac Enzymes: No results for input(s): "CKTOTAL", "CKMB", "CKMBINDEX", "TROPONINI" in the last 8760 hours. BNP: Invalid input(s): "POCBNP" Lab Results  Component Value Date   HGBA1C 5.7 06/18/2018   Lab Results  Component Value Date   TSH 1.41 07/10/2023   Lab Results  Component Value Date   VITAMINB12 352 10/28/2014   No results found for: "FOLATE" No results found for: "IRON", "TIBC", "FERRITIN"  Imaging and Procedures obtained prior to SNF admission: DG Chest 2 View Result Date: 08/10/2022 CLINICAL DATA:  Fall EXAM: CHEST - 2 VIEW COMPARISON:  Chest x-ray dated June 15, 2021 FINDINGS: Unchanged cardiomegaly. Bibasilar atelectasis. Lungs otherwise clear. No evidence of pneumothorax. Old left-sided rib fractures IMPRESSION: Unchanged cardiomegaly.  Bibasilar atelectasis. Electronically Signed   By: Allegra Lai M.D.   On: 08/10/2022 10:16   DG Pelvis 1-2 Views Result Date: 08/10/2022 CLINICAL DATA:  Fall. EXAM: PELVIS - 1-2 VIEW COMPARISON:  None Available. FINDINGS: There is diffuse decreased bone mineralization. Mild bilateral femoroacetabular joint space narrowing. Mild bilateral sacroiliac subchondral sclerosis without joint space narrowing. The pubic symphysis joint space is maintained. No definite acute fracture line is seen. IMPRESSION: 1. No acute fracture is seen. 2. Mild bilateral femoroacetabular osteoarthritis.  Electronically Signed   By: Neita Garnet M.D.   On: 08/10/2022 10:13   CT Head Wo Contrast Result Date: 08/10/2022 CLINICAL DATA:  Head trauma, minor (Age >= 65y); Neck trauma (Age >= 65y). Unwitnessed fall, scalp laceration. EXAM: CT HEAD WITHOUT CONTRAST CT CERVICAL SPINE WITHOUT CONTRAST TECHNIQUE: Multidetector CT imaging of the head and cervical spine was performed following the standard protocol without intravenous contrast. Multiplanar CT image reconstructions of the cervical spine were also generated. RADIATION DOSE REDUCTION: This exam was performed according to the departmental dose-optimization program which includes automated exposure control, adjustment of the mA and/or kV according to patient size and/or use of iterative reconstruction technique. COMPARISON:  None Available. FINDINGS: CT HEAD FINDINGS Brain: Normal anatomic configuration. Parenchymal volume loss is commensurate with the patient's age. Stable moderate periventricular white matter changes are present likely reflecting the sequela of small vessel ischemia. Remote lacunar infarct noted within the right thalamus no abnormal intra or extra-axial mass lesion or fluid collection. No abnormal mass effect or midline shift. No evidence of acute intracranial hemorrhage or  infarct. Ventricular size is normal. Cerebellum unremarkable. Vascular: No asymmetric hyperdense vasculature at the skull base. Skull: Intact Sinuses/Orbits: Paranasal sinuses are clear. Orbits are unremarkable. Other: Mastoid air cells and middle ear cavities are clear. Mild left occipital scalp soft tissue swelling. CT CERVICAL SPINE FINDINGS Alignment: 3 mm anterolisthesis C4-5 and 3 mm retrolisthesis C5-6 are likely degenerative in nature. Otherwise normal cervical alignment. Skull base and vertebrae: Craniocervical alignment is normal. The atlantodental interval is not widened. No acute fracture of the cervical spine. Vertebral body height is preserved. There is  ankylosis of the right C4-5 facet joint Soft tissues and spinal canal: No prevertebral fluid or swelling. No visible canal hematoma. Disc levels: There is intervertebral disc space narrowing and endplate remodeling at C5-6 and C6-7 in keeping with changes of advanced degenerative disc disease. Milder degenerative changes are noted throughout the survey nerve the cervical spine. Prevertebral soft tissues are not thickened on sagittal reformats. No high-grade canal stenosis. Multilevel uncovertebral and facet arthrosis results in moderate to severe bilateral neuroforaminal narrowing at C3-C6., Upper chest: Negative. Other: None IMPRESSION: 1. No acute intracranial abnormality. No calvarial fracture. Mild left occipital scalp soft tissue swelling. 2. No acute fracture or listhesis of the cervical spine. Electronically Signed   By: Helyn Numbers M.D.   On: 08/10/2022 10:11   CT Cervical Spine Wo Contrast Result Date: 08/10/2022 CLINICAL DATA:  Head trauma, minor (Age >= 65y); Neck trauma (Age >= 65y). Unwitnessed fall, scalp laceration. EXAM: CT HEAD WITHOUT CONTRAST CT CERVICAL SPINE WITHOUT CONTRAST TECHNIQUE: Multidetector CT imaging of the head and cervical spine was performed following the standard protocol without intravenous contrast. Multiplanar CT image reconstructions of the cervical spine were also generated. RADIATION DOSE REDUCTION: This exam was performed according to the departmental dose-optimization program which includes automated exposure control, adjustment of the mA and/or kV according to patient size and/or use of iterative reconstruction technique. COMPARISON:  None Available. FINDINGS: CT HEAD FINDINGS Brain: Normal anatomic configuration. Parenchymal volume loss is commensurate with the patient's age. Stable moderate periventricular white matter changes are present likely reflecting the sequela of small vessel ischemia. Remote lacunar infarct noted within the right thalamus no abnormal  intra or extra-axial mass lesion or fluid collection. No abnormal mass effect or midline shift. No evidence of acute intracranial hemorrhage or infarct. Ventricular size is normal. Cerebellum unremarkable. Vascular: No asymmetric hyperdense vasculature at the skull base. Skull: Intact Sinuses/Orbits: Paranasal sinuses are clear. Orbits are unremarkable. Other: Mastoid air cells and middle ear cavities are clear. Mild left occipital scalp soft tissue swelling. CT CERVICAL SPINE FINDINGS Alignment: 3 mm anterolisthesis C4-5 and 3 mm retrolisthesis C5-6 are likely degenerative in nature. Otherwise normal cervical alignment. Skull base and vertebrae: Craniocervical alignment is normal. The atlantodental interval is not widened. No acute fracture of the cervical spine. Vertebral body height is preserved. There is ankylosis of the right C4-5 facet joint Soft tissues and spinal canal: No prevertebral fluid or swelling. No visible canal hematoma. Disc levels: There is intervertebral disc space narrowing and endplate remodeling at C5-6 and C6-7 in keeping with changes of advanced degenerative disc disease. Milder degenerative changes are noted throughout the survey nerve the cervical spine. Prevertebral soft tissues are not thickened on sagittal reformats. No high-grade canal stenosis. Multilevel uncovertebral and facet arthrosis results in moderate to severe bilateral neuroforaminal narrowing at C3-C6., Upper chest: Negative. Other: None IMPRESSION: 1. No acute intracranial abnormality. No calvarial fracture. Mild left occipital scalp soft tissue swelling. 2. No acute  fracture or listhesis of the cervical spine. Electronically Signed   By: Helyn Numbers M.D.   On: 08/10/2022 10:11    Assessment/Plan   1. Flu (Primary) Will start tamiflu 75mg  twice daily for5 days Will start tessalon for cough  Chest x ray  Cbc, bmp  Start claritin 10 mg daily  Monitor vitals  every shift    Care plan discussed with the nursing  staff   30 min Total time spent for obtaining history,  performing a medically appropriate examination and evaluation, reviewing the tests,ordering  tests,  documenting clinical information in the electronic or other health record, independently interpreting results ,care coordination (not separately reported)

## 2023-09-11 ENCOUNTER — Encounter: Payer: Self-pay | Admitting: Nurse Practitioner

## 2023-09-11 ENCOUNTER — Encounter: Payer: Self-pay | Admitting: Sports Medicine

## 2023-09-11 ENCOUNTER — Non-Acute Institutional Stay (SKILLED_NURSING_FACILITY): Payer: Self-pay | Admitting: Nurse Practitioner

## 2023-09-11 DIAGNOSIS — R062 Wheezing: Secondary | ICD-10-CM | POA: Diagnosis not present

## 2023-09-11 DIAGNOSIS — S2242XD Multiple fractures of ribs, left side, subsequent encounter for fracture with routine healing: Secondary | ICD-10-CM | POA: Diagnosis not present

## 2023-09-11 DIAGNOSIS — R051 Acute cough: Secondary | ICD-10-CM | POA: Diagnosis not present

## 2023-09-11 DIAGNOSIS — R269 Unspecified abnormalities of gait and mobility: Secondary | ICD-10-CM | POA: Diagnosis not present

## 2023-09-11 DIAGNOSIS — F0390 Unspecified dementia without behavioral disturbance: Secondary | ICD-10-CM | POA: Diagnosis not present

## 2023-09-11 DIAGNOSIS — J09X2 Influenza due to identified novel influenza A virus with other respiratory manifestations: Secondary | ICD-10-CM | POA: Diagnosis not present

## 2023-09-11 DIAGNOSIS — F339 Major depressive disorder, recurrent, unspecified: Secondary | ICD-10-CM

## 2023-09-11 DIAGNOSIS — I509 Heart failure, unspecified: Secondary | ICD-10-CM

## 2023-09-11 DIAGNOSIS — E039 Hypothyroidism, unspecified: Secondary | ICD-10-CM

## 2023-09-11 DIAGNOSIS — J9811 Atelectasis: Secondary | ICD-10-CM | POA: Diagnosis not present

## 2023-09-11 LAB — CBC AND DIFFERENTIAL
HCT: 40 (ref 36–46)
Hemoglobin: 13.4 (ref 12.0–16.0)
Platelets: 136 10*3/uL — AB (ref 150–400)
WBC: 6.7

## 2023-09-11 LAB — BASIC METABOLIC PANEL
BUN: 25 — AB (ref 4–21)
CO2: 26 — AB (ref 13–22)
Chloride: 106 (ref 99–108)
Creatinine: 1 (ref 0.5–1.1)
Glucose: 85
Potassium: 4.1 meq/L (ref 3.5–5.1)
Sodium: 142 (ref 137–147)

## 2023-09-11 LAB — COMPREHENSIVE METABOLIC PANEL
Calcium: 8.4 — AB (ref 8.7–10.7)
eGFR: 53

## 2023-09-11 LAB — CBC: RBC: 4.1 (ref 3.87–5.11)

## 2023-09-11 NOTE — Assessment & Plan Note (Addendum)
 Gait abnormality, uses walker, risk for falling  f/u ED visit last night for occiput scalp laceration sustained from fall, 2 staples intact and no active bleeding/sign of infection. The patient was seen in bed, smiled and followed directions during my examination. Denied pain, focal weakness, afebrile. Remove staples 10 days.

## 2023-09-11 NOTE — Assessment & Plan Note (Signed)
 09/29/22 CXR CHF, 10/01/22 CXR moderate CHF, on Furosemide daily, BNP 151, Bun/creat 16/0.9 07/10/23,  10/06/22 Echo EF 75%

## 2023-09-11 NOTE — Assessment & Plan Note (Signed)
 on Levothyroxine, TSH 1.41 07/10/23

## 2023-09-11 NOTE — Assessment & Plan Note (Signed)
 lack of safety awareness,  resides in SNF [facility] for safety, care assistance, failed discontinuation of  Donepezil

## 2023-09-11 NOTE — Assessment & Plan Note (Signed)
Her mood is stabilizing, but still anxious at times, on Escitalopram, prn Lorazepam 

## 2023-09-11 NOTE — Assessment & Plan Note (Signed)
 The patient was seen in bed, smiled and followed directions during my examination. Denied pain, focal weakness, afebrile. She was noted to have cough, wheezing, and O2 desaturation. Pending CXR, CBC, BMP.   Flu 09/10/23 started Tamiflu, Claritin, Tessalon  Will add Prednisone 20mg  every day x3 days, DuoNeb q8hr x 72hrs, O2 2lpm via [place] to maintain Sat O2>90%

## 2023-09-11 NOTE — Progress Notes (Signed)
 Location:  [facility] Room Number: 2-B Place of Service:  SNF (31) Provider:  Cinzia Devos Lorenda Adline CARROLYN Sherlynn Jackalyn, MD  Patient Care Team: Sherlynn Jackalyn, MD as PCP - General (Internal Medicine) Emory Gallentine X, NP as Nurse Practitioner (Internal Medicine)  Extended Emergency Contact Information Primary Emergency Contact: Barsamian,[name] Address: [address]          [facility] [place], [place] [address] [place] Home Phone: [phone] Mobile Phone: [phone] Relation: Son Secondary Emergency Contact: Bon,Brett Address: [address]          [place], [place] [address] [place] Home Phone: [phone] Mobile Phone: [phone] Relation: Son  Code Status:  DNR Goals of care: Advanced Directive information    09/11/2023   11:21 AM  Advanced Directives  Does Patient Have a Medical Advance Directive? Yes  Type of Estate Agent of [place];Living will;Out of facility DNR (pink MOST or yellow form)  Copy of Healthcare Power of Attorney in Chart? Yes - validated most recent copy scanned in chart (See row information)  Pre-existing out of facility DNR order (yellow form or pink MOST form) Pink MOST/Yellow Form most recent copy in chart - Physician notified to receive inpatient order     Chief Complaint  Patient presents with   Acute Visit    F/u ED    HPI:  Pt is a 89 y.o. female seen today for an acute visit for f/u ED visit last night for occiput scalp laceration sustained from fall, 2 staples intact and no active bleeding/sign of infection. The patient was seen in bed, smiled and followed directions during my examination. Denied pain, focal weakness, afebrile. She was noted to have cough, wheezing, and O2 desaturation. Pending CXR, CBC, BMP.   Flu 09/10/23 started Tamiflu, Claritin, Tessalon   09/29/22 CXR CHF, 10/01/22 CXR moderate CHF, on Furosemide daily, BNP 151, Bun/creat 16/0.9 07/10/23,   10/06/22 Echo EF 75%             Gait abnormality, uses walker, risk for falling             Dementia, lack of safety awareness,  resides in SNF [facility] for safety, care assistance, failed discontinuation of  Donepezil               Her mood is stabilizing, but still anxious at times, on Escitalopram , prn Lorazepam              Hypothyroidism, on Levothyroxine , TSH 1.41 07/10/23   Past Medical History:  Diagnosis Date   Eczema    Hypothyroid    Hypothyroidism    Hypothyroidism    Vitamin D  deficiency    Past Surgical History:  Procedure Laterality Date   CHOLECYSTECTOMY N/A 06/24/2013   Procedure: LAPAROSCOPIC CHOLECYSTECTOMY WITH INTRAOPERATIVE CHOLANGIOGRAM;  Surgeon: Lynda Leos, MD;  Location: [facility] OR;  Service: General;  Laterality: N/A;   TONSILLECTOMY      No Known Allergies  Outpatient Encounter Medications as of 09/11/2023  Medication Sig   acetaminophen  (TYLENOL ) 325 MG tablet Take 650 mg by mouth every 4 (four) hours as needed for fever.   aspirin  EC 81 MG tablet Take 81 mg by mouth daily. Swallow whole.   benzonatate (TESSALON) 100 MG capsule Take 200 mg by mouth 2 (two) times daily.   calcium -vitamin D  (OSCAL WITH D) 500-200 MG-UNIT tablet Take 1 tablet by mouth 2 (two) times daily.   Emollient (AQUAPHOR OINTMENT BODY EX) Apply to Back, Chest, BLE,  BUE topically one time a day for Dry Skin;Itching;Rash related to DERMATITIS, UNSPECIFIED (L30.9) Apply Aquaphor Ointment to rashes on back chest BUE &BLE daily   escitalopram  (LEXAPRO ) 20 MG tablet Take 20 mg by mouth daily.   levothyroxine  (SYNTHROID ) 100 MCG tablet Take 100 mcg by mouth. one time a day every Sat, Sun   levothyroxine  (SYNTHROID ) 88 MCG tablet Take 88 mcg by mouth daily. Mon, Tue, Wed, Thu, Fr   Lidocaine  & Camphor-Men Lotn 5 & 0.5-0.5 % KIT Apply topically. Apply to neck, abdomen,extremities topically two times a day for Itching   loratadine (CLARITIN) 10 MG tablet Take 10 mg by mouth daily.   LORazepam   (ATIVAN ) 0.5 MG tablet Take 0.5 mg by mouth every 8 (eight) hours as needed for anxiety. Agitation.  Give 1 tablet by mouth every 8 hours as needed for agitation for 14 Days   melatonin 1 MG TABS tablet Take 1 mg by mouth at bedtime.   oseltamivir (TAMIFLU) 75 MG capsule Take 75 mg by mouth 2 (two) times daily.   potassium chloride  (MICRO-K ) 10 MEQ CR capsule Take 10 mEq by mouth daily.   Sodium Fluoride (PREVIDENT 5000 BOOSTER PLUS) 1.1 % PSTE Place 1 application  onto teeth at bedtime. for oral care USE A PEA SIZE AMOUNT DURING NIGHTTIME OAL CARE. SPIT OUT ANY EXCESS. DO NOT RINSE. DO NOT EAT/DRINK  FOR 30 MINS AFTER   triamcinolone cream (KENALOG) 0.1 % as needed.   donepezil  (ARICEPT ) 5 MG tablet Take 5 mg by mouth at bedtime. (Patient not taking: Reported on 08/31/2023)   furosemide (LASIX) 20 MG tablet Take 20 mg by mouth daily. (Patient not taking: Reported on 09/11/2023)   lactose free nutrition (BOOST) LIQD Take by mouth daily. nourishment give 1 bottle with 1 cup vanilla ice cream at 10 am (Patient not taking: Reported on 09/11/2023)   [DISCONTINUED] acetaminophen  (TYLENOL ) 325 MG tablet Take 650 mg by mouth every 4 (four) hours as needed for moderate pain. (Patient not taking: Reported on 09/10/2023)   No facility-administered encounter medications on file as of 09/11/2023.    Review of Systems  Constitutional:  Positive for fatigue. Negative for appetite change and fever.  HENT:  Positive for congestion and hearing loss. Negative for voice change.   Eyes:  Negative for visual disturbance.  Respiratory:  Positive for cough and wheezing. Negative for shortness of breath.   Cardiovascular:  Negative for leg swelling.  Gastrointestinal:  Negative for abdominal pain and constipation.  Genitourinary:  Negative for dysuria, flank pain and urgency.  Musculoskeletal:  Positive for gait problem.  Skin:  Positive for wound.  Neurological:  Negative for speech difficulty, weakness and  light-headedness.       Memory lapses.   Psychiatric/Behavioral:  Positive for confusion. Negative for sleep disturbance. The patient is nervous/anxious.     Immunization History  Administered Date(s) Administered   Fluad Quad(high Dose 65+) 07/03/2022, 06/13/2023   Influenza Whole 06/15/2018   Influenza,inj,Quad PF,6+ Mos 06/25/2013   Influenza-Unspecified 06/23/2020, 06/30/2021   Moderna SARS-COV2 Booster Vaccination 06/27/2023   Moderna Sars-Covid-2 Vaccination 09/13/2019, 10/11/2019, 07/20/2020, 02/08/2021   Pfizer Covid-19 Vaccine Bivalent Booster 39yrs & up 05/31/2021, 04/19/2022, 07/13/2022   Pneumococcal Conjugate-13 03/11/2014   Pneumococcal Polysaccharide-23 09/11/1986, 09/11/2001, 06/25/2013   Tdap 03/16/2011, 12/22/2021   Unspecified SARS-COV-2 Vaccination 05/31/2021   Zoster Recombinant(Shingrix) 09/12/2007, 09/02/2022   Pertinent  Health Maintenance Due  Topic Date Due   INFLUENZA VACCINE  Completed   DEXA SCAN  Completed  01/28/2022    3:26 PM 02/15/2022   11:35 AM 04/07/2022   10:29 AM 08/10/2022    9:16 AM 09/19/2022   11:39 AM  Fall Risk  Falls in the past year?  1 1  0  Was there an injury with Fall?  1 1  0  Fall Risk Category Calculator  3 3  0  Fall Risk Category (Retired)  [employer]  Low  (RETIRED) Patient Fall Risk Level High fall risk High fall risk High fall risk High fall risk Low fall risk  Patient at Risk for Falls Due to  History of fall(s) History of fall(s)  No Fall Risks  Fall risk Follow up  Falls evaluation completed Falls evaluation completed;Education provided  Falls evaluation completed   Functional Status Survey:    Vitals:   09/11/23 1113  BP: (!) 118/57  Pulse: 61  Resp: 20  Temp: 97.6 F (36.4 C)  SpO2: (!) 88%  Weight: 156 lb 6.4 oz (70.9 kg)  Height: 5' (1.524 m)   Body mass index is 30.54 kg/m. Physical Exam Vitals and nursing note reviewed.  Constitutional:      Comments: Seen in bed, smiled, followed directions,  answered simple questions.   HENT:     Head: Normocephalic and atraumatic.     Nose: Congestion present.     Mouth/Throat:     Mouth: Mucous membranes are moist.  Eyes:     Extraocular Movements: Extraocular movements intact.     Conjunctiva/sclera: Conjunctivae normal.     Pupils: Pupils are equal, round, and reactive to light.  Cardiovascular:     Rate and Rhythm: Normal rate and regular rhythm.     Heart sounds: No murmur heard. Pulmonary:     Breath sounds: Wheezing, rhonchi and rales present.  Abdominal:     General: Bowel sounds are normal.     Palpations: Abdomen is soft.     Tenderness: There is no abdominal tenderness.     Hernia: A hernia is present.     Comments: Umbilical hernia.   Musculoskeletal:     Cervical back: Normal range of motion and neck supple.     Right lower leg: No edema.     Left lower leg: No edema.  Skin:    General: Skin is warm and dry.     Comments: Occiput scalp laceration repair with 2 staples, intact, no active bleeding or s/s of infection.   Neurological:     General: No focal deficit present.     Mental Status: She is alert. Mental status is at baseline.     Gait: Gait abnormal.     Comments: Oriented to person  Psychiatric:        Mood and Affect: Mood normal.        Behavior: Behavior normal.     Labs reviewed: Recent Labs    09/12/22 0000 06/12/23 0000 07/10/23 0000  NA 141 139 141  K 3.9 4.0 4.2  CL 105 103 103  CO2 25* 27* 30*  BUN 12 14 16   CREATININE 0.8 0.9 0.9  CALCIUM  8.9 9.1 1.4*   Recent Labs    06/12/23 0000  AST 18  ALT 19  ALKPHOS 70  ALBUMIN 3.6   Recent Labs    06/12/23 0000 07/10/23 0000  WBC 9.0 7.9  NEUTROABS 5,004.00 4,045.00  HGB 15.6 15.4  HCT 48* 46  PLT 182 203   Lab Results  Component Value Date   TSH 1.41 07/10/2023  Lab Results  Component Value Date   HGBA1C 5.7 06/18/2018   Lab Results  Component Value Date   CHOL 188 01/30/2018   HDL 64 01/30/2018   LDLCALC 106 (H)  01/30/2018   TRIG 89 01/30/2018   CHOLHDL 2.9 01/30/2018    Significant Diagnostic Results in last 30 days:  No results found.  Assessment/Plan There are no diagnoses linked to this encounter.   Family/ staff Communication: plan of care reviewed with the patient and charge nurse.   Labs/tests ordered:  pending CXR, CBC, BMP  Time spend 30 minutes.

## 2023-09-18 ENCOUNTER — Encounter: Payer: Self-pay | Admitting: Nurse Practitioner

## 2023-09-18 ENCOUNTER — Non-Acute Institutional Stay (SKILLED_NURSING_FACILITY): Payer: Medicare Other | Admitting: Nurse Practitioner

## 2023-09-18 DIAGNOSIS — E039 Hypothyroidism, unspecified: Secondary | ICD-10-CM

## 2023-09-18 DIAGNOSIS — R269 Unspecified abnormalities of gait and mobility: Secondary | ICD-10-CM

## 2023-09-18 DIAGNOSIS — F0390 Unspecified dementia without behavioral disturbance: Secondary | ICD-10-CM | POA: Diagnosis not present

## 2023-09-18 DIAGNOSIS — Z66 Do not resuscitate: Secondary | ICD-10-CM

## 2023-09-18 DIAGNOSIS — F339 Major depressive disorder, recurrent, unspecified: Secondary | ICD-10-CM

## 2023-09-18 DIAGNOSIS — I509 Heart failure, unspecified: Secondary | ICD-10-CM

## 2023-09-18 NOTE — Assessment & Plan Note (Signed)
Her mood is stabilizing, but still anxious at times, on Escitalopram, prn Lorazepam 

## 2023-09-18 NOTE — Assessment & Plan Note (Signed)
 lack of safety awareness,  resides in SNF [facility] for safety, care assistance, failed discontinuation of  Donepezil

## 2023-09-18 NOTE — Assessment & Plan Note (Signed)
 on Levothyroxine, TSH 1.41 07/10/23

## 2023-09-18 NOTE — Assessment & Plan Note (Signed)
 09/29/22 CXR CHF, 10/01/22 CXR moderate CHF, on Furosemide daily, Bun/creat 25/1.0 09/10/24,  10/06/22 Echo EF 75%

## 2023-09-18 NOTE — Assessment & Plan Note (Signed)
 uses walker, risk for falling ED visit 09/10/23 for occiput scalp laceration sustained from fall, 2 staples closure, healing

## 2023-09-18 NOTE — Progress Notes (Signed)
 Location:  [facility] Room Number: 002-B Place of Service:  SNF (31) Provider:  Lorette Peterkin X.NP  Sherlynn Madden, MD  Patient Care Team: Sherlynn Madden, MD as PCP - General (Internal Medicine) Malayja Freund X, NP as Nurse Practitioner (Internal Medicine)  Extended Emergency Contact Information Primary Emergency Contact: Faller,[name] Address: [address]          [facility] [place], [place] [address] [place] Home Phone: [phone] Mobile Phone: [phone] Relation: Son Secondary Emergency Contact: Aikey,Brett Address: [address]          [place], [place] [address] [place] Home Phone: [phone] Mobile Phone: [phone] Relation: Son  Code Status:  DNR Goals of care: Advanced Directive information    09/18/2023    8:52 AM  Advanced Directives  Does Patient Have a Medical Advance Directive? Yes  Type of Estate Agent of [place];Living will;Out of facility DNR (pink MOST or yellow form)  Does patient want to make changes to medical advance directive? No - Patient declined  Copy of Healthcare Power of Attorney in Chart? Yes - validated most recent copy scanned in chart (See row information)  Pre-existing out of facility DNR order (yellow form or pink MOST form) Pink MOST/Yellow Form most recent copy in chart - Physician notified to receive inpatient order     Chief Complaint  Patient presents with   Medical Management of Chronic Issues    Routine visit     HPI:  Pt is a 89 y.o. female seen today for medical management of chronic diseases.     ED visit 09/10/23 for occiput scalp laceration sustained from fall, 2 staples closure, healing             Flu 09/10/23 started Tamiflu, Claritin, Tessalon, resolved. CXR 09/11/23 no acute findings.               09/29/22 CXR CHF, 10/01/22 CXR moderate CHF, on Furosemide daily, Bun/creat 25/1.0 09/10/24,  10/06/22 Echo EF 75%             Gait  abnormality, uses walker, risk for falling             Dementia, lack of safety awareness,  resides in SNF [facility] for safety, care assistance, failed discontinuation of  Donepezil               Her mood is stabilizing, but still anxious at times, on Escitalopram , prn Lorazepam              Hypothyroidism, on Levothyroxine , TSH 1.41 07/10/23     Past Medical History:  Diagnosis Date   Eczema    Hypothyroid    Hypothyroidism    Hypothyroidism    Vitamin D  deficiency    Past Surgical History:  Procedure Laterality Date   CHOLECYSTECTOMY N/A 06/24/2013   Procedure: LAPAROSCOPIC CHOLECYSTECTOMY WITH INTRAOPERATIVE CHOLANGIOGRAM;  Surgeon: Lynda Leos, MD;  Location: [facility] OR;  Service: General;  Laterality: N/A;   TONSILLECTOMY      No Known Allergies  Outpatient Encounter Medications as of 09/18/2023  Medication Sig   acetaminophen  (TYLENOL ) 325 MG tablet Take 650 mg by mouth every 4 (four) hours as needed for fever.   aspirin  EC 81 MG tablet Take 81 mg by mouth daily. Swallow whole.   calcium -vitamin D  (OSCAL WITH D) 500-200 MG-UNIT tablet Take 1 tablet by mouth 2 (two) times daily.   Emollient (AQUAPHOR OINTMENT BODY EX) Apply to Back, Chest, BLE, BUE topically one  time a day for Dry Skin;Itching;Rash related to DERMATITIS, UNSPECIFIED (L30.9) Apply Aquaphor Ointment to rashes on back chest BUE &BLE daily   escitalopram  (LEXAPRO ) 20 MG tablet Take 20 mg by mouth daily.   furosemide (LASIX) 20 MG tablet Take 20 mg by mouth daily.   lactose free nutrition (BOOST) LIQD Take by mouth daily. nourishment give 1 bottle with 1 cup vanilla ice cream at 10 am   levothyroxine  (SYNTHROID ) 100 MCG tablet Take 100 mcg by mouth. one time a day every Sat, Sun   levothyroxine  (SYNTHROID ) 88 MCG tablet Take 88 mcg by mouth daily. Mon, Tue, Wed, Thu, Fr   Lidocaine  & Camphor-Men Lotn 5 & 0.5-0.5 % KIT Apply topically. Apply to neck, abdomen,extremities topically two times a day for Itching   LORazepam   (ATIVAN ) 0.5 MG tablet Take 0.5 mg by mouth every 8 (eight) hours as needed for anxiety. Agitation.  Give 1 tablet by mouth every 8 hours as needed for agitation for 14 Days   melatonin 1 MG TABS tablet Take 1 mg by mouth at bedtime.   potassium chloride  (MICRO-K ) 10 MEQ CR capsule Take 10 mEq by mouth daily.   Sodium Fluoride (PREVIDENT 5000 BOOSTER PLUS) 1.1 % PSTE Place 1 application  onto teeth at bedtime. for oral care USE A PEA SIZE AMOUNT DURING NIGHTTIME OAL CARE. SPIT OUT ANY EXCESS. DO NOT RINSE. DO NOT EAT/DRINK  FOR 30 MINS AFTER   triamcinolone cream (KENALOG) 0.1 % as needed.   benzonatate (TESSALON) 100 MG capsule Take 200 mg by mouth 2 (two) times daily. (Patient not taking: Reported on 09/18/2023)   donepezil  (ARICEPT ) 5 MG tablet Take 5 mg by mouth at bedtime. (Patient not taking: Reported on 09/18/2023)   loratadine (CLARITIN) 10 MG tablet Take 10 mg by mouth daily. (Patient not taking: Reported on 09/18/2023)   oseltamivir (TAMIFLU) 75 MG capsule Take 75 mg by mouth 2 (two) times daily. (Patient not taking: Reported on 09/18/2023)   No facility-administered encounter medications on file as of 09/18/2023.    Review of Systems  Constitutional:  Negative for appetite change, fatigue and fever.  HENT:  Positive for hearing loss. Negative for congestion and voice change.   Eyes:  Negative for visual disturbance.  Respiratory:  Negative for cough, shortness of breath and wheezing.   Cardiovascular:  Negative for leg swelling.  Gastrointestinal:  Negative for abdominal pain and constipation.  Genitourinary:  Negative for dysuria and urgency.  Musculoskeletal:  Positive for gait problem.  Skin:  Positive for wound.  Neurological:  Negative for speech difficulty, weakness and light-headedness.       Memory lapses.   Psychiatric/Behavioral:  Positive for confusion. Negative for sleep disturbance. The patient is nervous/anxious.     Immunization History  Administered Date(s) Administered    Fluad Quad(high Dose 65+) 07/03/2022, 06/13/2023   Influenza Whole 06/15/2018   Influenza,inj,Quad PF,6+ Mos 06/25/2013   Influenza-Unspecified 06/23/2020, 06/30/2021   Moderna SARS-COV2 Booster Vaccination 06/27/2023   Moderna Sars-Covid-2 Vaccination 09/13/2019, 10/11/2019, 07/20/2020, 02/08/2021   Pfizer Covid-19 Vaccine Bivalent Booster 65yrs & up 05/31/2021, 04/19/2022, 07/13/2022   Pneumococcal Conjugate-13 03/11/2014   Pneumococcal Polysaccharide-23 09/11/1986, 09/11/2001, 06/25/2013   Tdap 03/16/2011, 12/22/2021   Unspecified SARS-COV-2 Vaccination 05/31/2021   Zoster Recombinant(Shingrix) 09/12/2007, 09/02/2022   Pertinent  Health Maintenance Due  Topic Date Due   INFLUENZA VACCINE  Completed   DEXA SCAN  Completed      01/28/2022    3:26 PM 02/15/2022   11:35 AM  04/07/2022   10:29 AM 08/10/2022    9:16 AM 09/19/2022   11:39 AM  Fall Risk  Falls in the past year?  1 1  0  Was there an injury with Fall?  1 1  0  Fall Risk Category Calculator  3 3  0  Fall Risk Category (Retired)  [employer]  Low  (RETIRED) Patient Fall Risk Level High fall risk High fall risk High fall risk High fall risk Low fall risk  Patient at Risk for Falls Due to  History of fall(s) History of fall(s)  No Fall Risks  Fall risk Follow up  Falls evaluation completed Falls evaluation completed;Education provided  Falls evaluation completed   Functional Status Survey:    Vitals:   09/18/23 0850  BP: 108/71  Pulse: 62  Resp: 16  Temp: (!) 97.2 F (36.2 C)  SpO2: 93%  Weight: 156 lb 6.4 oz (70.9 kg)  Height: 5' (1.524 m)   Body mass index is 30.54 kg/m. Physical Exam Vitals and nursing note reviewed.  Constitutional:      Appearance: Normal appearance.  HENT:     Head: Normocephalic and atraumatic.     Nose: Nose normal.     Mouth/Throat:     Mouth: Mucous membranes are moist.  Eyes:     Extraocular Movements: Extraocular movements intact.     Conjunctiva/sclera: Conjunctivae normal.      Pupils: Pupils are equal, round, and reactive to light.  Cardiovascular:     Rate and Rhythm: Normal rate and regular rhythm.     Heart sounds: No murmur heard. Pulmonary:     Breath sounds: Rales present. No wheezing.     Comments: Bibasilar rales. Abdominal:     General: Bowel sounds are normal.     Palpations: Abdomen is soft.     Tenderness: There is no abdominal tenderness.     Hernia: A hernia is present.     Comments: Umbilical hernia.   Musculoskeletal:     Cervical back: Normal range of motion and neck supple.     Right lower leg: No edema.     Left lower leg: No edema.  Skin:    General: Skin is warm and dry.     Comments: Occiput scalp laceration repair with 2 staples, healing nicely.   Neurological:     General: No focal deficit present.     Mental Status: She is alert. Mental status is at baseline.     Gait: Gait abnormal.     Comments: Oriented to person  Psychiatric:        Mood and Affect: Mood normal.        Behavior: Behavior normal.     Labs reviewed: Recent Labs    06/12/23 0000 07/10/23 0000 09/11/23 0000  NA 139 141 142  K 4.0 4.2 4.1  CL 103 103 106  CO2 27* 30* 26*  BUN 14 16 25*  CREATININE 0.9 0.9 1.0  CALCIUM  9.1 1.4* 8.4*   Recent Labs    06/12/23 0000  AST 18  ALT 19  ALKPHOS 70  ALBUMIN 3.6   Recent Labs    06/12/23 0000 07/10/23 0000 09/11/23 0000  WBC 9.0 7.9 6.7  NEUTROABS 5,004.00 4,045.00  --   HGB 15.6 15.4 13.4  HCT 48* 46 40  PLT 182 203 136*   Lab Results  Component Value Date   TSH 1.41 07/10/2023   Lab Results  Component Value Date   HGBA1C 5.7 06/18/2018  Lab Results  Component Value Date   CHOL 188 01/30/2018   HDL 64 01/30/2018   LDLCALC 106 (H) 01/30/2018   TRIG 89 01/30/2018   CHOLHDL 2.9 01/30/2018    Significant Diagnostic Results in last 30 days:  No results found.  Assessment/Plan CHF (congestive heart failure) (HCC) 09/29/22 CXR CHF, 10/01/22 CXR moderate CHF, on Furosemide  daily, Bun/creat 25/1.0 09/10/24,  10/06/22 Echo EF 75%  Gait abnormality  uses walker, risk for falling ED visit 09/10/23 for occiput scalp laceration sustained from fall, 2 staples closure, healing  Dementia without behavioral disturbance (HCC)  lack of safety awareness,  resides in SN[facility] for safety, care assistance, failed discontinuation of  Donepezil    Depression, recurrent (HCC) Her mood is stabilizing, but still anxious at times, on Escitalopram , prn Lorazepam   Hypothyroidism on Levothyroxine , TSH 1.41 07/10/23     Family/ staff Communication: plan of care reviewed with the patient and charge nurse.   Labs/tests ordered:  none  Time spend 30 minutes.

## 2023-09-27 ENCOUNTER — Encounter: Payer: Self-pay | Admitting: Nurse Practitioner

## 2023-09-27 ENCOUNTER — Non-Acute Institutional Stay (SKILLED_NURSING_FACILITY): Payer: Medicare Other | Admitting: Nurse Practitioner

## 2023-09-27 DIAGNOSIS — I509 Heart failure, unspecified: Secondary | ICD-10-CM

## 2023-09-27 DIAGNOSIS — F339 Major depressive disorder, recurrent, unspecified: Secondary | ICD-10-CM

## 2023-09-27 DIAGNOSIS — E039 Hypothyroidism, unspecified: Secondary | ICD-10-CM

## 2023-09-27 DIAGNOSIS — F0390 Unspecified dementia without behavioral disturbance: Secondary | ICD-10-CM

## 2023-09-27 DIAGNOSIS — K1120 Sialoadenitis, unspecified: Secondary | ICD-10-CM

## 2023-09-27 DIAGNOSIS — R269 Unspecified abnormalities of gait and mobility: Secondary | ICD-10-CM

## 2023-09-27 DIAGNOSIS — Z66 Do not resuscitate: Secondary | ICD-10-CM | POA: Diagnosis not present

## 2023-09-27 NOTE — Assessment & Plan Note (Signed)
uses walker, risk for falling

## 2023-09-27 NOTE — Assessment & Plan Note (Signed)
Her mood is stabilizing, but still anxious at times, on Escitalopram, prn Lorazepam 

## 2023-09-27 NOTE — Assessment & Plan Note (Signed)
lack of safety awareness,  resides in SNF [facility] for safety, care assistance, failed discontinuation of  Donepezil

## 2023-09-27 NOTE — Progress Notes (Signed)
Location:  [facility] Room Number: [id number] Place of Service:  SNF (31) Provider:  Deatra Robinson, NP  Patient Care Team: Venita Sheffield, MD as PCP - General (Internal Medicine) Lekeisha Arenas X, NP as Nurse Practitioner (Internal Medicine)  Extended Emergency Contact Information Primary Emergency Contact: Raborn,[name] Address: [address]          [facility] [place], [place] [address] Darden Amber of [place] Home Phone: [phone] Mobile Phone: [phone] Relation: Son Secondary Emergency Contact: Gibbins,Brett Address: [address]          [place], [place] [address] Darden Amber of [place] Home Phone: [phone] Mobile Phone: [phone] Relation: Son  Code Status:  DNR Goals of care: Advanced Directive information    09/18/2023    8:52 AM  Advanced Directives  Does Patient Have a Medical Advance Directive? Yes  Type of Estate agent of [place];Living will;Out of facility DNR (pink MOST or yellow form)  Does patient want to make changes to medical advance directive? No - Patient declined  Copy of Healthcare Power of Attorney in Chart? Yes - validated most recent copy scanned in chart (See row information)  Pre-existing out of facility DNR order (yellow form or pink MOST form) Pink MOST/Yellow Form most recent copy in chart - Physician notified to receive inpatient order     Chief Complaint  Patient presents with   Acute Visit    Right jaw pain and swelling.    HPI:  Pt is a 89 y.o. female seen today for an acute visit for redness, swelling, tenderness, and warmth right jaw, no pain when the R ear moved and no noted dental abscess. Afebrile, decreased appetite.   ED visit 09/10/23 for occiput scalp laceration healed.              Flu 09/10/23 started Tamiflu, Claritin, Tessalon, resolved. CXR 09/11/23 no acute findings.               09/29/22 CXR CHF, 10/01/22 CXR moderate CHF, on Furosemide daily, Bun/creat 25/1.0 09/10/24,   10/06/22 Echo EF 75%             Gait abnormality, uses walker, risk for falling             Dementia, lack of safety awareness,  resides in SNF [facility] for safety, care assistance, failed discontinuation of  Donepezil              Her mood is stabilizing, but still anxious at times, on Escitalopram, prn Lorazepam             Hypothyroidism, on Levothyroxine, TSH 1.41 07/10/23       Past Medical History:  Diagnosis Date   Eczema    Hypothyroid    Hypothyroidism    Hypothyroidism    Vitamin D deficiency    Past Surgical History:  Procedure Laterality Date   CHOLECYSTECTOMY N/A 06/24/2013   Procedure: LAPAROSCOPIC CHOLECYSTECTOMY WITH INTRAOPERATIVE CHOLANGIOGRAM;  Surgeon: Axel Filler, MD;  Location: [facility] OR;  Service: General;  Laterality: N/A;   TONSILLECTOMY      No Known Allergies  Outpatient Encounter Medications as of 09/27/2023  Medication Sig   acetaminophen (TYLENOL) 650 MG CR tablet Take 650 mg by mouth every 4 (four) hours as needed for pain.   aspirin EC 81 MG tablet Take 81 mg by mouth daily. Swallow whole.   calcium-vitamin D (OSCAL WITH D) 500-200 MG-UNIT tablet Take 1 tablet by mouth 2 (two) times daily.  camphor-menthol (SARNA) lotion Apply 1 Application topically as directed. Apply to neck, abdomen, BLE/BUE topically two times   donepezil (ARICEPT) 5 MG tablet Take 5 mg by mouth at bedtime.   Emollient (AQUAPHOR OINTMENT BODY EX) Apply to Back, Chest, BLE, BUE topically one time a day for Dry Skin;Itching;Rash related to DERMATITIS, UNSPECIFIED (L30.9) Apply Aquaphor Ointment to rashes on back chest BUE &BLE daily   escitalopram (LEXAPRO) 20 MG tablet Take 20 mg by mouth daily.   furosemide (LASIX) 20 MG tablet Take 20 mg by mouth daily.   lactose free nutrition (BOOST) LIQD Take by mouth daily. nourishment give 1 bottle with 1 cup vanilla ice cream at 10 am   levothyroxine (SYNTHROID) 100 MCG tablet Take 100 mcg by mouth. one time a day every Sat, Sun    levothyroxine (SYNTHROID) 88 MCG tablet Take 88 mcg by mouth daily. Mon, Tue, Wed, Thu, Fr   loratadine (CLARITIN) 10 MG tablet Take 10 mg by mouth daily.   LORazepam (ATIVAN) 0.5 MG tablet Take 0.5 mg by mouth every 8 (eight) hours as needed for anxiety. Agitation.  Give 1 tablet by mouth every 8 hours as needed for agitation for 14 Days   melatonin 1 MG TABS tablet Take 1 mg by mouth at bedtime.   potassium chloride (MICRO-K) 10 MEQ CR capsule Take 10 mEq by mouth daily.   Sodium Fluoride (PREVIDENT 5000 BOOSTER PLUS) 1.1 % PSTE Place 1 application  onto teeth at bedtime. for oral care USE A PEA SIZE AMOUNT DURING NIGHTTIME OAL CARE. SPIT OUT ANY EXCESS. DO NOT RINSE. DO NOT EAT/DRINK  FOR 30 MINS AFTER   triamcinolone cream (KENALOG) 0.1 % 1 Application 2 (two) times daily as needed (Apply to chest). X 2 week   acetaminophen (TYLENOL) 325 MG tablet Take 650 mg by mouth every 4 (four) hours as needed for fever. (Patient not taking: Reported on 09/27/2023)   benzonatate (TESSALON) 100 MG capsule Take 200 mg by mouth 2 (two) times daily. (Patient not taking: Reported on 09/18/2023)   Lidocaine & Camphor-Men Lotn 5 & 0.5-0.5 % KIT Apply topically. Apply to neck, abdomen,extremities topically two times a day for Itching (Patient not taking: Reported on 09/27/2023)   oseltamivir (TAMIFLU) 75 MG capsule Take 75 mg by mouth 2 (two) times daily. (Patient not taking: Reported on 09/18/2023)   No facility-administered encounter medications on file as of 09/27/2023.    Review of Systems  Unable to perform ROS: Dementia    Immunization History  Administered Date(s) Administered   Fluad Quad(high Dose 65+) 07/03/2022, 06/13/2023   Influenza Whole 06/15/2018   Influenza,inj,Quad PF,6+ Mos 06/25/2013   Influenza-Unspecified 06/23/2020, 06/30/2021   Moderna SARS-COV2 Booster Vaccination 06/27/2023   Moderna Sars-Covid-2 Vaccination 09/13/2019, 10/11/2019, 07/20/2020, 02/08/2021   Pfizer Covid-19 Vaccine  Bivalent Booster 1yrs & up 05/31/2021, 04/19/2022, 07/13/2022   Pneumococcal Conjugate-13 03/11/2014   Pneumococcal Polysaccharide-23 09/11/1986, 09/11/2001, 06/25/2013   Tdap 03/16/2011, 12/22/2021   Unspecified SARS-COV-2 Vaccination 05/31/2021   Zoster Recombinant(Shingrix) 09/12/2007, 09/02/2022   Pertinent  Health Maintenance Due  Topic Date Due   INFLUENZA VACCINE  Completed   DEXA SCAN  Completed      01/28/2022    3:26 PM 02/15/2022   11:35 AM 04/07/2022   10:29 AM 08/10/2022    9:16 AM 09/19/2022   11:39 AM  Fall Risk  Falls in the past year?  1 1  0  Was there an injury with Fall?  1 1  0  Fall  Risk Category Calculator  3 3  0  Fall Risk Category (Retired)  [employer]  Low  (RETIRED) Patient Fall Risk Level High fall risk High fall risk High fall risk High fall risk Low fall risk  Patient at Risk for Falls Due to  History of fall(s) History of fall(s)  No Fall Risks  Fall risk Follow up  Falls evaluation completed Falls evaluation completed;Education provided  Falls evaluation completed   Functional Status Survey:    Vitals:   09/27/23 1042  BP: 108/61  Pulse: 64  Temp: (!) 97.4 F (36.3 C)  SpO2: 92%  Weight: 149 lb 6.4 oz (67.8 kg)  Height: 5' (1.524 m)   Body mass index is 29.18 kg/m. Physical Exam Vitals and nursing note reviewed.  Constitutional:      Comments: tired  HENT:     Head: Normocephalic and atraumatic.     Comments: R jaw tenderness, warmth, swelling, redness    Nose: Nose normal.     Mouth/Throat:     Mouth: Mucous membranes are dry.     Comments: No dental abscess noted.  Eyes:     Extraocular Movements: Extraocular movements intact.     Conjunctiva/sclera: Conjunctivae normal.     Pupils: Pupils are equal, round, and reactive to light.  Cardiovascular:     Rate and Rhythm: Normal rate and regular rhythm.     Heart sounds: No murmur heard. Pulmonary:     Breath sounds: Rales present. No wheezing.     Comments: Bibasilar  rales. Abdominal:     General: Bowel sounds are normal.     Palpations: Abdomen is soft.     Tenderness: There is no abdominal tenderness.     Hernia: A hernia is present.     Comments: Umbilical hernia.   Musculoskeletal:     Cervical back: Normal range of motion and neck supple.     Right lower leg: No edema.     Left lower leg: No edema.  Skin:    General: Skin is warm and dry.     Comments: Occiput scalp laceration repair with 2 staples, healing nicely.   Neurological:     General: No focal deficit present.     Mental Status: She is alert. Mental status is at baseline.     Gait: Gait abnormal.     Comments: Oriented to person  Psychiatric:        Mood and Affect: Mood normal.        Behavior: Behavior normal.     Labs reviewed: Recent Labs    06/12/23 0000 07/10/23 0000 09/11/23 0000  NA 139 141 142  K 4.0 4.2 4.1  CL 103 103 106  CO2 27* 30* 26*  BUN 14 16 25*  CREATININE 0.9 0.9 1.0  CALCIUM 9.1 1.4* 8.4*   Recent Labs    06/12/23 0000  AST 18  ALT 19  ALKPHOS 70  ALBUMIN 3.6   Recent Labs    06/12/23 0000 07/10/23 0000 09/11/23 0000  WBC 9.0 7.9 6.7  NEUTROABS 5,004.00 4,045.00  --   HGB 15.6 15.4 13.4  HCT 48* 46 40  PLT 182 203 136*   Lab Results  Component Value Date   TSH 1.41 07/10/2023   Lab Results  Component Value Date   HGBA1C 5.7 06/18/2018   Lab Results  Component Value Date   CHOL 188 01/30/2018   HDL 64 01/30/2018   LDLCALC 106 (H) 01/30/2018   TRIG 89 01/30/2018  CHOLHDL 2.9 01/30/2018    Significant Diagnostic Results in last 30 days:  No results found.  Assessment/Plan Parotiditis  redness, swelling, tenderness, and warmth right jaw, no pain when the R ear moved and no noted dental abscess. Afebrile, decreased appetite.  Augmentin 875/125mg  bid x 7 days, hold Furosemide/Kcl x 7 days Update CBC/diff, CMP/eGFR  CHF (congestive heart failure) (HCC)  09/29/22 CXR CHF, 10/01/22 CXR moderate CHF, hold Furosemide x  7days, Bun/creat 25/1.0 09/10/24,  10/06/22 Echo EF 75%  Gait abnormality uses walker, risk for falling  Dementia without behavioral disturbance (HCC) lack of safety awareness,  resides in SNF [facility] for safety, care assistance, failed discontinuation of  Donepezil   Depression, recurrent (HCC) Her mood is stabilizing, but still anxious at times, on Escitalopram, prn Lorazepam  Hypothyroidism on Levothyroxine, TSH 1.41 07/10/23    Family/ staff Communication: plan of care reviewed with the patient and charge nurse.   Labs/tests ordered:  CBC/diff, CMP/eGFR  Time spend 30 minutes.

## 2023-09-27 NOTE — Assessment & Plan Note (Signed)
redness, swelling, tenderness, and warmth right jaw, no pain when the R ear moved and no noted dental abscess. Afebrile, decreased appetite.  Augmentin 875/125mg  bid x 7 days, hold Furosemide/Kcl x 7 days Update CBC/diff, CMP/eGFR

## 2023-09-27 NOTE — Assessment & Plan Note (Signed)
 on Levothyroxine, TSH 1.41 07/10/23

## 2023-09-27 NOTE — Assessment & Plan Note (Signed)
09/29/22 CXR CHF, 10/01/22 CXR moderate CHF, hold Furosemide x 7days, Bun/creat 25/1.0 09/10/24,  10/06/22 Echo EF 75%

## 2023-09-28 LAB — BASIC METABOLIC PANEL WITH GFR
BUN: 17 (ref 4–21)
CO2: 25 — AB (ref 13–22)
Chloride: 105 (ref 99–108)
Creatinine: 1 (ref 0.5–1.1)
Glucose: 129
Potassium: 4.2 meq/L (ref 3.5–5.1)
Sodium: 140 (ref 137–147)

## 2023-09-28 LAB — HEPATIC FUNCTION PANEL
ALT: 14 U/L (ref 7–35)
AST: 22 (ref 13–35)
Alkaline Phosphatase: 109 (ref 25–125)
Bilirubin, Total: 1.4

## 2023-09-28 LAB — CBC AND DIFFERENTIAL
HCT: 43 (ref 36–46)
Hemoglobin: 14.8 (ref 12.0–16.0)
Neutrophils Absolute: 21471
Platelets: 209 10*3/uL (ref 150–400)
WBC: 25.9

## 2023-09-28 LAB — COMPREHENSIVE METABOLIC PANEL WITH GFR
Albumin: 3.2 — AB (ref 3.5–5.0)
Calcium: 9.2 (ref 8.7–10.7)
Globulin: 2.9
eGFR: 53

## 2023-09-28 LAB — CBC: RBC: 4.46 (ref 3.87–5.11)

## 2023-10-02 LAB — CBC AND DIFFERENTIAL
HCT: 45 (ref 36–46)
Neutrophils Absolute: 4788
Platelets: 228 10*3/uL (ref 150–400)
WBC: 8.9

## 2023-10-02 LAB — CBC: RBC: 4.55 (ref 3.87–5.11)

## 2023-10-08 ENCOUNTER — Non-Acute Institutional Stay (SKILLED_NURSING_FACILITY): Payer: Medicare Other | Admitting: Sports Medicine

## 2023-10-08 ENCOUNTER — Encounter: Payer: Self-pay | Admitting: Sports Medicine

## 2023-10-08 DIAGNOSIS — R220 Localized swelling, mass and lump, head: Secondary | ICD-10-CM | POA: Diagnosis not present

## 2023-10-08 DIAGNOSIS — G309 Alzheimer's disease, unspecified: Secondary | ICD-10-CM | POA: Diagnosis not present

## 2023-10-08 DIAGNOSIS — F02818 Dementia in other diseases classified elsewhere, unspecified severity, with other behavioral disturbance: Secondary | ICD-10-CM

## 2023-10-08 NOTE — Progress Notes (Signed)
Provider:   Venita Sheffield MD Location:   [facility]   Place of Service:   Skilled care   PCP: Venita Sheffield, MD Patient Care Team: Venita Sheffield, MD as PCP - General (Internal Medicine) Mast, Man X, NP as Nurse Practitioner (Internal Medicine)  Extended Emergency Contact Information Primary Emergency Contact: Bourne,[name] Address: [address]          [facility] [place], [place] [address] Darden Amber of [place] Home Phone: [phone] Mobile Phone: [phone] Relation: Son Secondary Emergency Contact: Bouwman,Brett Address: [address]          [place], [place] [address] Darden Amber of [place] Home Phone: [phone] Mobile Phone: [phone] Relation: Son  Code Status:  Goals of Care: Advanced Directive information    09/18/2023    8:52 AM  Advanced Directives  Does Patient Have a Medical Advance Directive? Yes  Type of Estate agent of [place];Living will;Out of facility DNR (pink MOST or yellow form)  Does patient want to make changes to medical advance directive? No - Patient declined  Copy of Healthcare Power of Attorney in Chart? Yes - validated most recent copy scanned in chart (See row information)  Pre-existing out of facility DNR order (yellow form or pink MOST form) Pink MOST/Yellow Form most recent copy in chart - Physician notified to receive inpatient order      No chief complaint on file.   HPI: Patient is a 89 y.o. female seen today for  medication management.  Pt seen and examined in the living room. She seems pleasant and comfortable Does not appear to be in distress.  She is watching TV  Knows her name, not oriented to time or place.  Ambulates with rolator walker  Pt is high risk of falling, had a fall last week  Pt denies pain   Rt jaw swelling  Pt was evaluated for jaw swelling  She was treated with augmentin  Swelling and redness improved   Past Medical History:  Diagnosis Date    Eczema    Hypothyroid    Hypothyroidism    Hypothyroidism    Vitamin D deficiency    Past Surgical History:  Procedure Laterality Date   CHOLECYSTECTOMY N/A 06/24/2013   Procedure: LAPAROSCOPIC CHOLECYSTECTOMY WITH INTRAOPERATIVE CHOLANGIOGRAM;  Surgeon: Axel Filler, MD;  Location: [facility] OR;  Service: General;  Laterality: N/A;   TONSILLECTOMY      reports that she has never smoked. She has never used smokeless tobacco. She reports that she does not drink alcohol and does not use drugs. Social History   Socioeconomic History   Marital status: Widowed    Spouse name: Not on file   Number of children: 3   Years of education: College   Highest education level: Not on file  Occupational History   Occupation: Retired  Tobacco Use   Smoking status: Never   Smokeless tobacco: Never  Vaping Use   Vaping status: Never Used  Substance and Sexual Activity   Alcohol use: No    Alcohol/week: 0.0 standard drinks of alcohol   Drug use: No   Sexual activity: Not on file  Other Topics Concern   Not on file  Social History Narrative   Tobacco use, amount per day now: None      Past tobacco use, amount per day: None      How many years did you use tobacco: Never      Alcohol use (drinks per week): None      Diet:  2 meals a day at [facility]      Do you drink/eat things with caffeine? Coffee      Marital status: Widowed             What year were you married? 1952      Do you live in a house, apartment, assisted living, condo, trailer? Apartment      Is it one or more stories? 1      How many persons live in your home?      Do you have any pets in your home? 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 Yes           How often? Weekly and daily classes. Does walking on her own.      Do you have a living will? Yes      Do you have a DNR form?   Yes         If not, do you want to discuss one?      Do you have signed  POA/HPOA forms? Yes              Social Drivers of Corporate investment banker Strain: Low Risk  (10/02/2017)   Overall Financial Resource Strain (CARDIA)    Difficulty of Paying Living Expenses: Not hard at all  Food Insecurity: No Food Insecurity (10/02/2017)   Hunger Vital Sign    Worried About Running Out of Food in the Last Year: Never true    Ran Out of Food in the Last Year: Never true  Transportation Needs: No Transportation Needs (10/02/2017)   PRAPARE - Administrator, Civil Service (Medical): No    Lack of Transportation (Non-Medical): No  Physical Activity: Sufficiently Active (10/02/2017)   Exercise Vital Sign    Days of Exercise per Week: 7 days    Minutes of Exercise per Session: 30 min  Stress: No Stress Concern Present (10/02/2017)   [employer] of Occupational Health - Occupational Stress Questionnaire    Feeling of Stress : Not at all  Social Connections: Moderately Isolated (10/02/2017)   Social Connection and Isolation Panel [NHANES]    Frequency of Communication with Friends and Family: More than three times a week    Frequency of Social Gatherings with Friends and Family: More than three times a week    Attends Religious Services: Never    Database administrator or Organizations: No    Attends Banker Meetings: Never    Marital Status: Widowed  Intimate Partner Violence: Not At Risk (10/02/2017)   Humiliation, Afraid, Rape, and Kick questionnaire    Fear of Current or Ex-Partner: No    Emotionally Abused: No    Physically Abused: No    Sexually Abused: No    Functional Status Survey:    Family History  Problem Relation Age of Onset   Multiple myeloma Mother    Prostate cancer Father    Heart disease Father     Health Maintenance  Topic Date Due   COVID-19 Vaccine (10 - 2024-25 season) 08/22/2023   Medicare Annual Wellness (AWV)  07/08/2024   DTaP/Tdap/Td (3 - Td or Tdap) 12/23/2031   Pneumonia Vaccine 68+ Years old   Completed   INFLUENZA VACCINE  Completed   DEXA SCAN  Completed   Zoster Vaccines- Shingrix  Completed   HPV VACCINES  Aged Out  No Known Allergies  Outpatient Encounter Medications as of 10/08/2023  Medication Sig   acetaminophen (TYLENOL) 325 MG tablet Take 650 mg by mouth every 4 (four) hours as needed for fever. (Patient not taking: Reported on 09/27/2023)   acetaminophen (TYLENOL) 650 MG CR tablet Take 650 mg by mouth every 4 (four) hours as needed for pain.   aspirin EC 81 MG tablet Take 81 mg by mouth daily. Swallow whole.   benzonatate (TESSALON) 100 MG capsule Take 200 mg by mouth 2 (two) times daily. (Patient not taking: Reported on 09/18/2023)   calcium-vitamin D (OSCAL WITH D) 500-200 MG-UNIT tablet Take 1 tablet by mouth 2 (two) times daily.   camphor-menthol (SARNA) lotion Apply 1 Application topically as directed. Apply to neck, abdomen, BLE/BUE topically two times   donepezil (ARICEPT) 5 MG tablet Take 5 mg by mouth at bedtime.   Emollient (AQUAPHOR OINTMENT BODY EX) Apply to Back, Chest, BLE, BUE topically one time a day for Dry Skin;Itching;Rash related to DERMATITIS, UNSPECIFIED (L30.9) Apply Aquaphor Ointment to rashes on back chest BUE &BLE daily   escitalopram (LEXAPRO) 20 MG tablet Take 20 mg by mouth daily.   furosemide (LASIX) 20 MG tablet Take 20 mg by mouth daily.   lactose free nutrition (BOOST) LIQD Take by mouth daily. nourishment give 1 bottle with 1 cup vanilla ice cream at 10 am   levothyroxine (SYNTHROID) 100 MCG tablet Take 100 mcg by mouth. one time a day every Sat, Sun   levothyroxine (SYNTHROID) 88 MCG tablet Take 88 mcg by mouth daily. Mon, Tue, Wed, Thu, Fr   Lidocaine & Camphor-Men Lotn 5 & 0.5-0.5 % KIT Apply topically. Apply to neck, abdomen,extremities topically two times a day for Itching (Patient not taking: Reported on 09/27/2023)   loratadine (CLARITIN) 10 MG tablet Take 10 mg by mouth daily.   LORazepam (ATIVAN) 0.5 MG tablet Take 0.5 mg by  mouth every 8 (eight) hours as needed for anxiety. Agitation.  Give 1 tablet by mouth every 8 hours as needed for agitation for 14 Days   melatonin 1 MG TABS tablet Take 1 mg by mouth at bedtime.   oseltamivir (TAMIFLU) 75 MG capsule Take 75 mg by mouth 2 (two) times daily. (Patient not taking: Reported on 09/18/2023)   potassium chloride (MICRO-K) 10 MEQ CR capsule Take 10 mEq by mouth daily.   Sodium Fluoride (PREVIDENT 5000 BOOSTER PLUS) 1.1 % PSTE Place 1 application  onto teeth at bedtime. for oral care USE A PEA SIZE AMOUNT DURING NIGHTTIME OAL CARE. SPIT OUT ANY EXCESS. DO NOT RINSE. DO NOT EAT/DRINK  FOR 30 MINS AFTER   triamcinolone cream (KENALOG) 0.1 % 1 Application 2 (two) times daily as needed (Apply to chest). X 2 week   No facility-administered encounter medications on file as of 10/08/2023.    Review of Systems  There were no vitals filed for this visit. There is no height or weight on file to calculate BMI. Physical Exam Constitutional:      Appearance: Normal appearance.  HENT:     Head: Normocephalic and atraumatic.     Mouth/Throat:     Comments: Rt jaw - no redness, swelling improved No tenderness on palpation   Cardiovascular:     Rate and Rhythm: Normal rate and regular rhythm.  Pulmonary:     Effort: Pulmonary effort is normal. No respiratory distress.     Breath sounds: Normal breath sounds. No wheezing.  Abdominal:     General: Bowel sounds  are normal. There is no distension.     Tenderness: There is no abdominal tenderness. There is no guarding or rebound.     Comments:    Musculoskeletal:        General: No swelling or tenderness.  Neurological:     Mental Status: She is alert. Mental status is at baseline.     Motor: No weakness.     Labs reviewed: Basic Metabolic Panel: Recent Labs    06/12/23 0000 07/10/23 0000 09/11/23 0000  NA 139 141 142  K 4.0 4.2 4.1  CL 103 103 106  CO2 27* 30* 26*  BUN 14 16 25*  CREATININE 0.9 0.9 1.0  CALCIUM  9.1 1.4* 8.4*   Liver Function Tests: Recent Labs    06/12/23 0000  AST 18  ALT 19  ALKPHOS 70  ALBUMIN 3.6   No results for input(s): "LIPASE", "AMYLASE" in the last 8760 hours. No results for input(s): "AMMONIA" in the last 8760 hours. CBC: Recent Labs    06/12/23 0000 07/10/23 0000 09/11/23 0000  WBC 9.0 7.9 6.7  NEUTROABS 5,004.00 4,045.00  --   HGB 15.6 15.4 13.4  HCT 48* 46 40  PLT 182 203 136*   Cardiac Enzymes: No results for input(s): "CKTOTAL", "CKMB", "CKMBINDEX", "TROPONINI" in the last 8760 hours. BNP: Invalid input(s): "POCBNP" Lab Results  Component Value Date   HGBA1C 5.7 06/18/2018   Lab Results  Component Value Date   TSH 1.41 07/10/2023   Lab Results  Component Value Date   VITAMINB12 352 10/28/2014   No results found for: "FOLATE" No results found for: "IRON", "TIBC", "FERRITIN"  Imaging and Procedures obtained prior to SNF admission: No results found.  Assessment/Plan  1. Major neurocognitive disorder due to Alzheimer disease, with behavioral disturbance (HCC) (Primary) As per nursing staff pt is having behavioral episodes with increased confusion, crying spells She is requiring 2-3 times / week  Will change the ativan 1 tab prn    2. Jaw swelling Improved.   30 min Total time spent for obtaining history,  performing a medically appropriate examination and evaluation, reviewing the tests,   documenting clinical information in the electronic or other health record ,care coordination (not separately reported)

## 2023-10-19 ENCOUNTER — Encounter: Payer: Self-pay | Admitting: Nurse Practitioner

## 2023-10-19 ENCOUNTER — Non-Acute Institutional Stay (SKILLED_NURSING_FACILITY): Payer: Medicare Other | Admitting: Nurse Practitioner

## 2023-10-19 DIAGNOSIS — E039 Hypothyroidism, unspecified: Secondary | ICD-10-CM

## 2023-10-19 DIAGNOSIS — K1120 Sialoadenitis, unspecified: Secondary | ICD-10-CM

## 2023-10-19 DIAGNOSIS — F339 Major depressive disorder, recurrent, unspecified: Secondary | ICD-10-CM

## 2023-10-19 DIAGNOSIS — F0390 Unspecified dementia without behavioral disturbance: Secondary | ICD-10-CM

## 2023-10-19 DIAGNOSIS — I509 Heart failure, unspecified: Secondary | ICD-10-CM

## 2023-10-19 NOTE — Progress Notes (Signed)
 Location:   [facility] Room Number: 2B Place of Service:  SNF (31) Provider: Larwance Burrel Legrand NP  Sherlynn Madden, MD  Patient Care Team: Sherlynn Madden, MD as PCP - General (Internal Medicine) Antionette Luster X, NP as Nurse Practitioner (Internal Medicine)  Extended Emergency Contact Information Primary Emergency Contact: Marriott,[name] Address: [address]          [facility] [place], [place] [address] [place] Home Phone: [phone] Mobile Phone: [phone] Relation: Son Secondary Emergency Contact: Butsch,Brett Address: [address]          [place], [place] [address] [place] Home Phone: [phone] Mobile Phone: [phone] Relation: Son  Code Status:  DNR Goals of care: Advanced Directive information    09/18/2023    8:52 AM  Advanced Directives  Does Patient Have a Medical Advance Directive? Yes  Type of Estate Agent of [place];Living will;Out of facility DNR (pink MOST or yellow form)  Does patient want to make changes to medical advance directive? No - Patient declined  Copy of Healthcare Power of Attorney in Chart? Yes - validated most recent copy scanned in chart (See row information)  Pre-existing out of facility DNR order (yellow form or pink MOST form) Pink MOST/Yellow Form most recent copy in chart - Physician notified to receive inpatient order     Chief Complaint  Patient presents with   Medical Management of Chronic Issues    HPI:  Pt is a 89 y.o. female seen today for medical management of chronic diseases.    09/29/22 CXR CHF, 10/01/22 CXR moderate CHF, on Furosemide daily, Bun/creat 17/0.98 09/28/23, 10/06/22 Echo EF 75%             Gait abnormality, uses walker, risk for falling             Dementia, lack of safety awareness,  resides in SNF [facility] for safety, care assistance, failed discontinuation of  Donepezil               Her mood is stabilizing, but still anxious at times, on  Escitalopram , prn Lorazepam              Hypothyroidism, on Levothyroxine , TSH 1.41 07/10/23         Past Medical History:  Diagnosis Date   Eczema    Hypothyroid    Hypothyroidism    Hypothyroidism    Vitamin D  deficiency    Past Surgical History:  Procedure Laterality Date   CHOLECYSTECTOMY N/A 06/24/2013   Procedure: LAPAROSCOPIC CHOLECYSTECTOMY WITH INTRAOPERATIVE CHOLANGIOGRAM;  Surgeon: Lynda Leos, MD;  Location: [facility] OR;  Service: General;  Laterality: N/A;   TONSILLECTOMY      No Known Allergies  Allergies as of 10/19/2023   No Known Allergies      Medication List        Accurate as of October 19, 2023  4:38 PM. If you have any questions, ask your nurse or doctor.          acetaminophen  325 MG tablet Commonly known as: TYLENOL  Take 650 mg by mouth every 4 (four) hours as needed for fever.   acetaminophen  650 MG CR tablet Commonly known as: TYLENOL  Take 650 mg by mouth every 4 (four) hours as needed for pain.   AQUAPHOR OINTMENT BODY EX Apply to Back, Chest, BLE, BUE topically one time a day for Dry Skin;Itching;Rash related to DERMATITIS, UNSPECIFIED (L30.9) Apply Aquaphor Ointment to rashes on back chest BUE &BLE daily  aspirin  EC 81 MG tablet Take 81 mg by mouth daily. Swallow whole.   benzonatate 100 MG capsule Commonly known as: TESSALON Take 200 mg by mouth 2 (two) times daily.   calcium -vitamin D  500-200 MG-UNIT tablet Commonly known as: OSCAL WITH D Take 1 tablet by mouth 2 (two) times daily.   donepezil  5 MG tablet Commonly known as: ARICEPT  Take 5 mg by mouth at bedtime.   escitalopram  20 MG tablet Commonly known as: LEXAPRO  Take 20 mg by mouth daily.   furosemide 20 MG tablet Commonly known as: LASIX Take 20 mg by mouth daily.   lactose free nutrition Liqd Take by mouth daily. nourishment give 1 bottle with 1 cup vanilla ice cream at 10 am   levothyroxine  88 MCG tablet Commonly known as: SYNTHROID  Take 88 mcg by mouth  daily. Mon, Tue, Wed, Thu, Fr   levothyroxine  100 MCG tablet Commonly known as: SYNTHROID  Take 100 mcg by mouth. one time a day every Sat, Sun   Lidocaine  & Camphor-Men Lotn 5 & 0.5-0.5 % Kit Apply topically. Apply to neck, abdomen,extremities topically two times a day for Itching   loratadine 10 MG tablet Commonly known as: CLARITIN Take 10 mg by mouth daily.   LORazepam  0.5 MG tablet Commonly known as: ATIVAN  Take 0.5 mg by mouth every 8 (eight) hours as needed for anxiety. Agitation.  Give 1 tablet by mouth every 8 hours as needed for agitation for 14 Days   melatonin 1 MG Tabs tablet Take 1 mg by mouth at bedtime.   oseltamivir 75 MG capsule Commonly known as: TAMIFLU Take 75 mg by mouth 2 (two) times daily.   potassium chloride  10 MEQ CR capsule Commonly known as: MICRO-K  Take 10 mEq by mouth daily.   PreviDent 5000 Booster Plus 1.1 % Pste Generic drug: Sodium Fluoride Place 1 application  onto teeth at bedtime. for oral care USE A PEA SIZE AMOUNT DURING NIGHTTIME OAL CARE. SPIT OUT ANY EXCESS. DO NOT RINSE. DO NOT EAT/DRINK  FOR 30 MINS AFTER   Sarna lotion Generic drug: camphor-menthol Apply 1 Application topically as directed. Apply to neck, abdomen, BLE/BUE topically two times   triamcinolone cream 0.1 % Commonly known as: KENALOG 1 Application 2 (two) times daily as needed (Apply to chest). X 2 week        Review of Systems  Unable to perform ROS: Dementia    Immunization History  Administered Date(s) Administered   Fluad Quad(high Dose 65+) 07/03/2022, 06/13/2023   Influenza Whole 06/15/2018   Influenza,inj,Quad PF,6+ Mos 06/25/2013   Influenza-Unspecified 06/23/2020, 06/30/2021   Moderna SARS-COV2 Booster Vaccination 06/27/2023   Moderna Sars-Covid-2 Vaccination 09/13/2019, 10/11/2019, 07/20/2020, 02/08/2021   Pfizer Covid-19 Vaccine Bivalent Booster 89yrs & up 05/31/2021, 04/19/2022, 07/13/2022   Pneumococcal Conjugate-13 03/11/2014    Pneumococcal Polysaccharide-23 09/11/1986, 09/11/2001, 06/25/2013   Tdap 03/16/2011, 12/22/2021   Unspecified SARS-COV-2 Vaccination 05/31/2021   Zoster Recombinant(Shingrix) 09/12/2007, 09/02/2022   Pertinent  Health Maintenance Due  Topic Date Due   INFLUENZA VACCINE  Completed   DEXA SCAN  Completed      01/28/2022    3:26 PM 02/15/2022   11:35 AM 04/07/2022   10:29 AM 08/10/2022    9:16 AM 09/19/2022   11:39 AM  Fall Risk  Falls in the past year?  1 1  0  Was there an injury with Fall?  1 1  0  Fall Risk Category Calculator  3 3  0  Fall Risk Category (Retired)  [employer]  High  Low  (RETIRED) Patient Fall Risk Level High fall risk High fall risk High fall risk High fall risk Low fall risk  Patient at Risk for Falls Due to  History of fall(s) History of fall(s)  No Fall Risks  Fall risk Follow up  Falls evaluation completed Falls evaluation completed;Education provided  Falls evaluation completed   Functional Status Survey:    Vitals:   10/19/23 1432  BP: (!) 113/51  Pulse: 60  Resp: 20  Temp: (!) 97.5 F (36.4 C)  SpO2: 92%  Weight: 151 lb 12.8 oz (68.9 kg)   Body mass index is 29.65 kg/m. Physical Exam Vitals and nursing note reviewed.  Constitutional:      Appearance: Normal appearance.  HENT:     Head: Normocephalic and atraumatic.     Comments: R jaw tenderness, warmth, swelling, redness    Nose: Nose normal.     Mouth/Throat:     Mouth: Mucous membranes are moist.  Eyes:     Extraocular Movements: Extraocular movements intact.     Conjunctiva/sclera: Conjunctivae normal.     Pupils: Pupils are equal, round, and reactive to light.  Cardiovascular:     Rate and Rhythm: Normal rate and regular rhythm.     Heart sounds: No murmur heard. Pulmonary:     Breath sounds: Rales present.     Comments: Bibasilar rales. Abdominal:     General: Bowel sounds are normal.     Palpations: Abdomen is soft.     Tenderness: There is no abdominal tenderness.     Hernia: A  hernia is present.     Comments: Umbilical hernia.   Musculoskeletal:     Cervical back: Normal range of motion and neck supple.     Right lower leg: No edema.     Left lower leg: No edema.  Skin:    General: Skin is warm and dry.  Neurological:     General: No focal deficit present.     Mental Status: She is alert. Mental status is at baseline.     Gait: Gait abnormal.     Comments: Oriented to person  Psychiatric:        Mood and Affect: Mood normal.        Behavior: Behavior normal.     Labs reviewed: Recent Labs    06/12/23 0000 07/10/23 0000 09/11/23 0000  NA 139 141 142  K 4.0 4.2 4.1  CL 103 103 106  CO2 27* 30* 26*  BUN 14 16 25*  CREATININE 0.9 0.9 1.0  CALCIUM  9.1 1.4* 8.4*   Recent Labs    06/12/23 0000  AST 18  ALT 19  ALKPHOS 70  ALBUMIN 3.6   Recent Labs    06/12/23 0000 07/10/23 0000 09/11/23 0000  WBC 9.0 7.9 6.7  NEUTROABS 5,004.00 4,045.00  --   HGB 15.6 15.4 13.4  HCT 48* 46 40  PLT 182 203 136*   Lab Results  Component Value Date   TSH 1.41 07/10/2023   Lab Results  Component Value Date   HGBA1C 5.7 06/18/2018   Lab Results  Component Value Date   CHOL 188 01/30/2018   HDL 64 01/30/2018   LDLCALC 106 (H) 01/30/2018   TRIG 89 01/30/2018   CHOLHDL 2.9 01/30/2018    Significant Diagnostic Results in last 30 days:  No results found.  Assessment/Plan  Hypothyroidism on Levothyroxine , TSH 1.41 07/10/23  Depression, recurrent (HCC) Her mood is stabilizing, but still anxious at times,  on Escitalopram , prn Lorazepam   Dementia without behavioral disturbance (HCC) Dementia, lack of safety awareness,  resides in SNF [facility] for safety, care assistance, failed discontinuation of  Donepezil    CHF (congestive heart failure) (HCC) 09/29/22 CXR CHF, 10/01/22 CXR moderate CHF, Bun/creat 17/0.98 09/28/23, 10/06/22 Echo EF 75% No apparent swelling BLE, decrease Furosemide 10mg /Kcl 10meq every day, BMP 2 weeks  Parotiditis Wbc 25.9  09/28/23>>8.9 10/02/23   Family/ staff Communication: plan of care reviewed with the patient and charge nurse.   Labs/tests ordered:  BMP 2 weeks.

## 2023-10-19 NOTE — Assessment & Plan Note (Addendum)
 09/29/22 CXR CHF, 10/01/22 CXR moderate CHF, Bun/creat 17/0.98 09/28/23, 10/06/22 Echo EF 75% No apparent swelling BLE, decrease Furosemide 10mg /Kcl 10meq every day, BMP 2 weeks

## 2023-10-19 NOTE — Assessment & Plan Note (Signed)
 Dementia, lack of safety awareness,  resides in SNF [facility] for safety, care assistance, failed discontinuation of  Donepezil 

## 2023-10-19 NOTE — Assessment & Plan Note (Signed)
 Wbc 25.9 09/28/23>>8.9 10/02/23

## 2023-10-19 NOTE — Assessment & Plan Note (Signed)
Her mood is stabilizing, but still anxious at times, on Escitalopram, prn Lorazepam 

## 2023-10-19 NOTE — Assessment & Plan Note (Signed)
 on Levothyroxine, TSH 1.41 07/10/23

## 2023-11-01 LAB — BASIC METABOLIC PANEL WITH GFR
BUN: 11 (ref 4–21)
CO2: 27 — AB (ref 13–22)
Chloride: 106 (ref 99–108)
Creatinine: 0.8 (ref 0.5–1.1)
Glucose: 88
Potassium: 4.4 meq/L (ref 3.5–5.1)
Sodium: 141 (ref 137–147)

## 2023-11-01 LAB — COMPREHENSIVE METABOLIC PANEL WITH GFR
Calcium: 9 (ref 8.7–10.7)
eGFR: 66

## 2023-11-16 ENCOUNTER — Non-Acute Institutional Stay (SKILLED_NURSING_FACILITY): Payer: Self-pay | Admitting: Sports Medicine

## 2023-11-16 ENCOUNTER — Encounter: Payer: Self-pay | Admitting: Sports Medicine

## 2023-11-16 DIAGNOSIS — G309 Alzheimer's disease, unspecified: Secondary | ICD-10-CM | POA: Diagnosis not present

## 2023-11-16 DIAGNOSIS — F02818 Dementia in other diseases classified elsewhere, unspecified severity, with other behavioral disturbance: Secondary | ICD-10-CM

## 2023-11-16 DIAGNOSIS — I509 Heart failure, unspecified: Secondary | ICD-10-CM

## 2023-11-16 DIAGNOSIS — E039 Hypothyroidism, unspecified: Secondary | ICD-10-CM

## 2023-11-16 NOTE — Progress Notes (Addendum)
 Provider:  Dr. Venita Sheffield Location:  [facility] Place of Service:   Skilled care   PCP: Venita Sheffield, MD Patient Care Team: Venita Sheffield, MD as PCP - General (Internal Medicine) Mast, Man X, NP as Nurse Practitioner (Internal Medicine)  Extended Emergency Contact Information Primary Emergency Contact: Force,[name] Address: [address]          [facility] [place], [place] [address] [place] Home Phone: [phone] Mobile Phone: [phone] Relation: Son Secondary Emergency Contact: Rossini,Brett Address: [address]          [place], [place] [address] [place] of [place] Home Phone: [phone] Mobile Phone: [phone] Relation: Son  Goals of Care: Advanced Directive information    09/18/2023    8:52 AM  Advanced Directives  Does Patient Have a Medical Advance Directive? Yes  Type of Estate agent of [place];Living will;Out of facility DNR (pink MOST or yellow form)  Does patient want to make changes to medical advance directive? No - Patient declined  Copy of Healthcare Power of Attorney in Chart? Yes - validated most recent copy scanned in chart (See row information)  Pre-existing out of facility DNR order (yellow form or pink MOST form) Pink MOST/Yellow Form most recent copy in chart - Physician notified to receive inpatient order        History of Present Illness       89 year old female with a past medical history dementia, hypothyroidism, generalized anxiety disorder is evaluated for chronic disease management.  Patient is seen and examined in the living room. She ambulates with wheel chair.  As per staff she is confused at baseline she needs cueing and reminders.  She needs assistance with her ADLs. She gets agitated mostly after lunch and tries to get up on her own. She is high risk of falling She knows her name, cannot remember what she had for lunch.  11/14/2023 09:34 156.1 Lbs  (Wheelchair) 11/10/2023 13:45 156.1 Lbs (Wheelchair) 11/10/2023 12:40 156.1 Lbs (Wheelchair) 10/17/2023 11:45 151.8 Lbs (Wheelchair) 10/13/2023 12:10 149.1 Lbs (Wheelchair) -3.0% change from last weight [ Comparison Weight 09/05/2023, 156.4 lb  BASIC METABOLIC PANEL GLUCOSE 88 mg/dL 42-59 Final  Fasting reference interval UREA NITROGEN (BUN) 11 mg/dL 5-63 Final CREATININE 8.75 mg/dL 6.43-3.29 Final EGFR 66 mL/min/1.73 m2 > OR = 60 Final BUN/CREATININE RATIO SEE NOTE: (calc) 6-22 Final  Not Reported: BUN and Creatinine are within  reference range. SODIUM 141 mmol/L 135-146 Final POTASSIUM 4.4 mmol/L 3.5-5.3 Final CHLORIDE 106 mmol/L 98-110 Final CARBON DIOXIDE 27 mmol/L 20-32 Final CALCIUM 9.0 mg/dL 5.1-88.4 Fina  CBC (INCLUDES DIFF/PLT) WHITE BLOOD CELL COUNT 8.9 Thousand/u L 3.8-10.8 Final RED BLOOD CELL COUNT 4.55 Million/uL 3.80-5.10 Final HEMOGLOBIN 14.8 g/dL 16.6-06.3 Final HEMATOCRIT 44.9 % 35.0-45.0 Final MCV 98.7 fL 80.0-100.0 Final MCH 32.5 pg 27.0-33.0 Final MCHC 33.0 g/dL 01.6-01.0 Final  Past Medical History:  Diagnosis Date   Eczema    Hypothyroid    Hypothyroidism    Hypothyroidism    Vitamin D deficiency    Past Surgical History:  Procedure Laterality Date   CHOLECYSTECTOMY N/A 06/24/2013   Procedure: LAPAROSCOPIC CHOLECYSTECTOMY WITH INTRAOPERATIVE CHOLANGIOGRAM;  Surgeon: Axel Filler, MD;  Location: [facility] OR;  Service: General;  Laterality: N/A;   TONSILLECTOMY      reports that she has never smoked. She has never used smokeless tobacco. She reports that she does not drink alcohol and does not use drugs. Social History   Socioeconomic History   Marital status: Widowed  Spouse name: Not on file   Number of children: 3   Years of education: College   Highest education level: Not on file  Occupational History   Occupation: Retired  Tobacco Use   Smoking status: Never   Smokeless tobacco: Never  Vaping Use   Vaping status: Never Used   Substance and Sexual Activity   Alcohol use: No    Alcohol/week: 0.0 standard drinks of alcohol   Drug use: No   Sexual activity: Not on file  Other Topics Concern   Not on file  Social History Narrative   Tobacco use, amount per day now: None      Past tobacco use, amount per day: None      How many years did you use tobacco: Never      Alcohol use (drinks per week): None      Diet: 2 meals a day at [facility]      Do you drink/eat things with caffeine? Coffee      Marital status: Widowed             What year were you married? 1952      Do you live in a house, apartment, assisted living, condo, trailer? Apartment      Is it one or more stories? 1      How many persons live in your home?      Do you have any pets in your home? 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 Yes           How often? Weekly and daily classes. Does walking on her own.      Do you have a living will? Yes      Do you have a DNR form?   Yes         If not, do you want to discuss one?      Do you have signed POA/HPOA forms? Yes              Social Drivers of Corporate investment banker Strain: Low Risk  (10/02/2017)   Overall Financial Resource Strain (CARDIA)    Difficulty of Paying Living Expenses: Not hard at all  Food Insecurity: No Food Insecurity (10/02/2017)   Hunger Vital Sign    Worried About Running Out of Food in the Last Year: Never true    Ran Out of Food in the Last Year: Never true  Transportation Needs: No Transportation Needs (10/02/2017)   PRAPARE - Administrator, Civil Service (Medical): No    Lack of Transportation (Non-Medical): No  Physical Activity: Sufficiently Active (10/02/2017)   Exercise Vital Sign    Days of Exercise per Week: 7 days    Minutes of Exercise per Session: 30 min  Stress: No Stress Concern Present (10/02/2017)   [employer] of Occupational Health - Occupational Stress  Questionnaire    Feeling of Stress : Not at all  Social Connections: Moderately Isolated (10/02/2017)   Social Connection and Isolation Panel [NHANES]    Frequency of Communication with Friends and Family: More than three times a week    Frequency of Social Gatherings with Friends and Family: More than three times a week    Attends Religious Services: Never    Database administrator or Organizations: No    Attends Banker Meetings: Never    Marital Status: Widowed  Intimate Partner Violence: Not At Risk (10/02/2017)   Humiliation, Afraid, Rape, and Kick questionnaire    Fear of Current or Ex-Partner: No    Emotionally Abused: No    Physically Abused: No    Sexually Abused: No    Functional Status Survey:    Family History  Problem Relation Age of Onset   Multiple myeloma Mother    Prostate cancer Father    Heart disease Father     Health Maintenance  Topic Date Due   COVID-19 Vaccine (10 - 2024-25 season) 08/22/2023   Medicare Annual Wellness (AWV)  07/08/2024   DTaP/Tdap/Td (3 - Td or Tdap) 12/23/2031   Pneumonia Vaccine 11+ Years old  Completed   INFLUENZA VACCINE  Completed   DEXA SCAN  Completed   Zoster Vaccines- Shingrix  Completed   HPV VACCINES  Aged Out    No Known Allergies  Outpatient Encounter Medications as of 11/16/2023  Medication Sig   acetaminophen (TYLENOL) 325 MG tablet Take 650 mg by mouth every 4 (four) hours as needed for fever. (Patient not taking: Reported on 09/27/2023)   acetaminophen (TYLENOL) 650 MG CR tablet Take 650 mg by mouth every 4 (four) hours as needed for pain.   aspirin EC 81 MG tablet Take 81 mg by mouth daily. Swallow whole.   benzonatate (TESSALON) 100 MG capsule Take 200 mg by mouth 2 (two) times daily. (Patient not taking: Reported on 09/18/2023)   calcium-vitamin D (OSCAL WITH D) 500-200 MG-UNIT tablet Take 1 tablet by mouth 2 (two) times daily.   camphor-menthol (SARNA) lotion Apply 1 Application topically as  directed. Apply to neck, abdomen, BLE/BUE topically two times   donepezil (ARICEPT) 5 MG tablet Take 5 mg by mouth at bedtime.   Emollient (AQUAPHOR OINTMENT BODY EX) Apply to Back, Chest, BLE, BUE topically one time a day for Dry Skin;Itching;Rash related to DERMATITIS, UNSPECIFIED (L30.9) Apply Aquaphor Ointment to rashes on back chest BUE &BLE daily   escitalopram (LEXAPRO) 20 MG tablet Take 20 mg by mouth daily.   furosemide (LASIX) 20 MG tablet Take 20 mg by mouth daily.   lactose free nutrition (BOOST) LIQD Take by mouth daily. nourishment give 1 bottle with 1 cup vanilla ice cream at 10 am   levothyroxine (SYNTHROID) 100 MCG tablet Take 100 mcg by mouth. one time a day every Sat, Sun   levothyroxine (SYNTHROID) 88 MCG tablet Take 88 mcg by mouth daily. Mon, Tue, Wed, Thu, Fr   Lidocaine & Camphor-Men Lotn 5 & 0.5-0.5 % KIT Apply topically. Apply to neck, abdomen,extremities topically two times a day for Itching (Patient not taking: Reported on 09/27/2023)   loratadine (CLARITIN) 10 MG tablet Take 10 mg by mouth daily.   LORazepam (ATIVAN) 0.5 MG tablet Take 0.5 mg by mouth every 8 (eight) hours as needed for anxiety. Agitation.  Give 1 tablet by mouth every 8 hours as needed for agitation for 14 Days   melatonin 1 MG TABS tablet Take 1 mg by mouth at bedtime.   oseltamivir (TAMIFLU) 75 MG capsule Take 75 mg by mouth 2 (two) times daily. (Patient not taking: Reported on 09/18/2023)   potassium chloride (MICRO-K) 10 MEQ CR capsule Take 10 mEq by mouth daily.   Sodium Fluoride (PREVIDENT 5000 BOOSTER PLUS) 1.1 % PSTE Place 1 application  onto teeth at bedtime. for oral care USE A PEA SIZE AMOUNT DURING NIGHTTIME OAL CARE. SPIT OUT ANY EXCESS. DO NOT RINSE. DO NOT EAT/DRINK  FOR 30 MINS  AFTER   triamcinolone cream (KENALOG) 0.1 % 1 Application 2 (two) times daily as needed (Apply to chest). X 2 week   No facility-administered encounter medications on file as of 11/16/2023.       Review of Systems   Unable to perform ROS: Dementia  Constitutional:  Negative for fever.  Respiratory:  Negative for cough.   Cardiovascular:  Positive for leg swelling.  Gastrointestinal:  Negative for abdominal distention, abdominal pain, diarrhea and vomiting.  Genitourinary:  Negative for dysuria.  Neurological:  Negative for dizziness.  Psychiatric/Behavioral:  Positive for behavioral problems and confusion.    Negative unless indicated in HPI.  There were no vitals filed for this visit. There is no height or weight on file to calculate BMI. BP Readings from Last 3 Encounters:  10/19/23 (!) 113/51  10/08/23 (!) 111/58  09/27/23 108/61   Wt Readings from Last 3 Encounters:  10/19/23 151 lb 12.8 oz (68.9 kg)  10/08/23 149 lb 6.4 oz (67.8 kg)  09/27/23 149 lb 6.4 oz (67.8 kg)   Physical Exam Constitutional:      Appearance: Normal appearance.  HENT:     Head: Normocephalic and atraumatic.  Cardiovascular:     Rate and Rhythm: Normal rate and regular rhythm.  Pulmonary:     Effort: Pulmonary effort is normal. No respiratory distress.     Breath sounds: Normal breath sounds. No wheezing.  Abdominal:     General: Bowel sounds are normal. There is no distension.     Tenderness: There is no abdominal tenderness. There is no guarding or rebound.     Comments:    Musculoskeletal:        General: Swelling (1+ pitting odema) present. No tenderness.  Neurological:     Mental Status: She is alert. Mental status is at baseline.     Motor: No weakness.     Labs reviewed: Basic Metabolic Panel: Recent Labs    06/12/23 0000 07/10/23 0000 09/11/23 0000  NA 139 141 142  K 4.0 4.2 4.1  CL 103 103 106  CO2 27* 30* 26*  BUN 14 16 25*  CREATININE 0.9 0.9 1.0  CALCIUM 9.1 1.4* 8.4*   Liver Function Tests: Recent Labs    06/12/23 0000  AST 18  ALT 19  ALKPHOS 70  ALBUMIN 3.6   No results for input(s): "LIPASE", "AMYLASE" in the last 8760 hours. No results for input(s): "AMMONIA" in  the last 8760 hours. CBC: Recent Labs    06/12/23 0000 07/10/23 0000 09/11/23 0000  WBC 9.0 7.9 6.7  NEUTROABS 5,004.00 4,045.00  --   HGB 15.6 15.4 13.4  HCT 48* 46 40  PLT 182 203 136*   Cardiac Enzymes: No results for input(s): "CKTOTAL", "CKMB", "CKMBINDEX", "TROPONINI" in the last 8760 hours. BNP: Invalid input(s): "POCBNP" Lab Results  Component Value Date   HGBA1C 5.7 06/18/2018   Lab Results  Component Value Date   TSH 1.41 07/10/2023   Lab Results  Component Value Date   VITAMINB12 352 10/28/2014   No results found for: "FOLATE" No results found for: "IRON", "TIBC", "FERRITIN"  Imaging and Procedures obtained prior to SNF admission: No results found.  Assessment and Plan   Major neurocognitive disorder Continue with supportive care Continue with Aricept    Weight stable Patient exhibits some agitation at times Continue with Lexapro, Ativan as needed  Congestive heart failure Echo showed ejection fraction 75% Lungs  clear Lower extremity swelling 1+ pitting edema Continue with Lasix, potassium  supplements  Hypothyroidism- TSH 1.41 mIU/L 0.40-4.50  Final    Continue with levothyroxine.          30 min Total time spent for obtaining history,  performing a medically appropriate examination and evaluation, reviewing the tests,   documenting clinical information in the electronic or other health record, ,care coordination (not separately reported)

## 2023-12-12 ENCOUNTER — Encounter: Payer: Self-pay | Admitting: Adult Health

## 2023-12-12 ENCOUNTER — Non-Acute Institutional Stay (SKILLED_NURSING_FACILITY): Admitting: Adult Health

## 2023-12-12 DIAGNOSIS — F03918 Unspecified dementia, unspecified severity, with other behavioral disturbance: Secondary | ICD-10-CM

## 2023-12-12 DIAGNOSIS — B372 Candidiasis of skin and nail: Secondary | ICD-10-CM | POA: Diagnosis not present

## 2023-12-12 DIAGNOSIS — F339 Major depressive disorder, recurrent, unspecified: Secondary | ICD-10-CM

## 2023-12-12 DIAGNOSIS — E039 Hypothyroidism, unspecified: Secondary | ICD-10-CM

## 2023-12-12 DIAGNOSIS — F419 Anxiety disorder, unspecified: Secondary | ICD-10-CM

## 2023-12-12 DIAGNOSIS — I5022 Chronic systolic (congestive) heart failure: Secondary | ICD-10-CM

## 2023-12-12 MED ORDER — FLUCONAZOLE 150 MG PO TABS: 150.0000 mg | ORAL_TABLET | Freq: Once | ORAL | 0 refills | Status: AC

## 2023-12-12 MED ORDER — NYSTATIN 100000 UNIT/GM EX OINT: 1.0000 | TOPICAL_OINTMENT | Freq: Two times a day (BID) | CUTANEOUS | 0 refills | Status: AC

## 2023-12-12 MED ORDER — LORAZEPAM 0.5 MG PO TABS: 0.5000 mg | ORAL_TABLET | Freq: Two times a day (BID) | ORAL | 0 refills | Status: DC | PRN

## 2023-12-12 NOTE — Progress Notes (Deleted)
 Location:  [facility] Room Number: 2B Place of Service:  SNF (31) Provider: Gillis Santa, NP    Patient Care Team: Venita Sheffield, MD as PCP - General (Internal Medicine) Mast, Man X, NP as Nurse Practitioner (Internal Medicine)  Extended Emergency Contact Information Primary Emergency Contact: Grimaldo,[name] Address: [address]          [facility] [place], [place] [address] Darden Amber of [place] Home Phone: [phone] Mobile Phone: [phone] Relation: Son Secondary Emergency Contact: Cull,Brett Address: [address]          [place], [place] [address] Darden Amber of [place] Home Phone: [phone] Mobile Phone: [phone] Relation: Son  Code Status:  DNR Goals of care: Advanced Directive information    09/18/2023    8:52 AM  Advanced Directives  Does Patient Have a Medical Advance Directive? Yes  Type of Estate agent of [place];Living will;Out of facility DNR (pink MOST or yellow form)  Does patient want to make changes to medical advance directive? No - Patient declined  Copy of Healthcare Power of Attorney in Chart? Yes - validated most recent copy scanned in chart (See row information)  Pre-existing out of facility DNR order (yellow form or pink MOST form) Pink MOST/Yellow Form most recent copy in chart - Physician notified to receive inpatient order     Chief Complaint  Patient presents with   routine visit    HPI:  Pt is a 89 y.o. female seen today for medical management of chronic diseases.     Past Medical History:  Diagnosis Date   Eczema    Hypothyroid    Hypothyroidism    Hypothyroidism    Vitamin D deficiency    Past Surgical History:  Procedure Laterality Date   CHOLECYSTECTOMY N/A 06/24/2013   Procedure: LAPAROSCOPIC CHOLECYSTECTOMY WITH INTRAOPERATIVE CHOLANGIOGRAM;  Surgeon: Axel Filler, MD;  Location: [facility] OR;  Service: General;  Laterality: N/A;   TONSILLECTOMY       No Known Allergies  Outpatient Encounter Medications as of 12/12/2023  Medication Sig   acetaminophen (TYLENOL) 325 MG tablet Take 650 mg by mouth every 4 (four) hours as needed for fever.   aspirin EC 81 MG tablet Take 81 mg by mouth daily. Swallow whole.   calcium-vitamin D (OSCAL WITH D) 500-200 MG-UNIT tablet Take 1 tablet by mouth 2 (two) times daily.   camphor-menthol (SARNA) lotion Apply 1 Application topically as directed. Apply to neck, abdomen, BLE/BUE topically two times   donepezil (ARICEPT) 5 MG tablet Take 5 mg by mouth at bedtime.   Emollient (AQUAPHOR OINTMENT BODY EX) Apply to Back, Chest, BLE, BUE topically one time a day for Dry Skin;Itching;Rash related to DERMATITIS, UNSPECIFIED (L30.9) Apply Aquaphor Ointment to rashes on back chest BUE &BLE daily   escitalopram (LEXAPRO) 20 MG tablet Take 20 mg by mouth daily.   fluconazole (DIFLUCAN) 150 MG tablet Take 1 tablet (150 mg total) by mouth once for 1 dose.   lactose free nutrition (BOOST) LIQD Take by mouth daily. nourishment give 1 bottle with 1 cup vanilla ice cream at 10 am   levothyroxine (SYNTHROID) 100 MCG tablet Take 100 mcg by mouth. one time a day every Sat, Sun   levothyroxine (SYNTHROID) 88 MCG tablet Take 88 mcg by mouth daily. Mon, Tue, Wed, Thu, Fr   loratadine (CLARITIN) 10 MG tablet Take 10 mg by mouth daily.   melatonin 1 MG TABS tablet Take 1 mg by mouth at bedtime.  nystatin ointment (MYCOSTATIN) Apply 1 Application topically 2 (two) times daily for 14 days. Apply to rashes under bilateral breasts and bilateral groin   potassium chloride (MICRO-K) 10 MEQ CR capsule Take 10 mEq by mouth daily.   Sodium Fluoride (PREVIDENT 5000 BOOSTER PLUS) 1.1 % PSTE Place 1 application  onto teeth at bedtime. for oral care USE A PEA SIZE AMOUNT DURING NIGHTTIME OAL CARE. SPIT OUT ANY EXCESS. DO NOT RINSE. DO NOT EAT/DRINK  FOR 30 MINS AFTER   triamcinolone cream (KENALOG) 0.1 % 1 Application 2 (two) times daily as  needed (Apply to chest). X 2 week   [DISCONTINUED] LORazepam (ATIVAN) 0.5 MG tablet Take 0.5 mg by mouth every 12 (twelve) hours as needed for anxiety. Agitation.  Give 1 tablet by mouth every 12 hours as needed for agitation for 14 Days   furosemide (LASIX) 20 MG tablet Take 20 mg by mouth daily. (Patient not taking: Reported on 12/12/2023)   Lidocaine & Camphor-Men Lotn 5 & 0.5-0.5 % KIT Apply topically. Apply to neck, abdomen,extremities topically two times a day for Itching (Patient not taking: Reported on 12/12/2023)   LORazepam (ATIVAN) 0.5 MG tablet Take 1 tablet (0.5 mg total) by mouth every 12 (twelve) hours as needed for anxiety. Agitation.  Give 1 tablet by mouth every 12 hours as needed for agitation for 14 Days   oseltamivir (TAMIFLU) 75 MG capsule Take 75 mg by mouth 2 (two) times daily. (Patient not taking: Reported on 09/18/2023)   [DISCONTINUED] acetaminophen (TYLENOL) 650 MG CR tablet Take 650 mg by mouth every 4 (four) hours as needed for pain. (Patient not taking: Reported on 12/12/2023)   [DISCONTINUED] benzonatate (TESSALON) 100 MG capsule Take 200 mg by mouth 2 (two) times daily. (Patient not taking: Reported on 09/18/2023)   No facility-administered encounter medications on file as of 12/12/2023.    Review of Systems  Immunization History  Administered Date(s) Administered   Fluad Quad(high Dose 65+) 07/03/2022, 06/13/2023   Influenza Whole 06/15/2018   Influenza, High Dose Seasonal PF 06/13/2023   Influenza,inj,Quad PF,6+ Mos 06/25/2013   Influenza-Unspecified 06/23/2020, 06/30/2021   Moderna Covid-19 Vaccine Bivalent Booster 106yrs & up 06/27/2023   Moderna SARS-COV2 Booster Vaccination 06/27/2023   Moderna Sars-Covid-2 Vaccination 09/13/2019, 10/11/2019, 07/20/2020, 02/08/2021   PPD Test 08/16/2023   Pfizer Covid-19 Vaccine Bivalent Booster 40yrs & up 05/31/2021, 04/19/2022, 07/13/2022   Pneumococcal Conjugate-13 03/11/2014   Pneumococcal Polysaccharide-23 09/11/1986,  09/11/2001, 06/25/2013   RSV,unspecified 10/29/2022   Tdap 03/16/2011, 12/22/2021   Unspecified SARS-COV-2 Vaccination 05/31/2021   Zoster Recombinant(Shingrix) 09/12/2007, 09/02/2022   Zoster, Unspecified 11/21/2022   Pertinent  Health Maintenance Due  Topic Date Due   INFLUENZA VACCINE  04/11/2024   DEXA SCAN  Completed      01/28/2022    3:26 PM 02/15/2022   11:35 AM 04/07/2022   10:29 AM 08/10/2022    9:16 AM 09/19/2022   11:39 AM  Fall Risk  Falls in the past year?  1 1  0  Was there an injury with Fall?  1 1  0  Fall Risk Category Calculator  3 3  0  Fall Risk Category (Retired)  [employer]  Low  (RETIRED) Patient Fall Risk Level High fall risk High fall risk High fall risk High fall risk Low fall risk  Patient at Risk for Falls Due to  History of fall(s) History of fall(s)  No Fall Risks  Fall risk Follow up  Falls evaluation completed Falls evaluation completed;Education provided  Falls evaluation completed   Functional Status Survey:    Vitals:   12/12/23 1026  BP: 126/66  Pulse: (!) 59  Resp: 18  Temp: 97.8 F (36.6 C)  SpO2: 91%  Weight: 156 lb 9.6 oz (71 kg)  Height: 5' (1.524 m)   Body mass index is 30.58 kg/m. Physical Exam  Labs reviewed: Recent Labs    09/11/23 0000 09/28/23 0000 11/01/23 0000  NA 142 140 141  K 4.1 4.2 4.4  CL 106 105 106  CO2 26* 25* 27*  BUN 25* 17 11  CREATININE 1.0 1.0 0.8  CALCIUM 8.4* 9.2 9.0   Recent Labs    06/12/23 0000 09/28/23 0000  AST 18 22  ALT 19 14  ALKPHOS 70 109  ALBUMIN 3.6 3.2*   Recent Labs    07/10/23 0000 09/11/23 0000 09/28/23 0000 10/02/23 0000  WBC 7.9 6.7 25.9 8.9  NEUTROABS 4,045.00  --  21,471.00 4,788.00  HGB 15.4 13.4 14.8  --   HCT 46 40 43 45  PLT 203 136* 209 228   Lab Results  Component Value Date   TSH 1.41 07/10/2023   Lab Results  Component Value Date   HGBA1C 5.7 06/18/2018   Lab Results  Component Value Date   CHOL 188 01/30/2018   HDL 64 01/30/2018   LDLCALC  106 (H) 01/30/2018   TRIG 89 01/30/2018   CHOLHDL 2.9 01/30/2018    Significant Diagnostic Results in last 30 days:  No results found.  Assessment/Plan 1. Candidal skin infection (Primary) *** - fluconazole (DIFLUCAN) 150 MG tablet; Take 1 tablet (150 mg total) by mouth once for 1 dose.  Dispense: 1 tablet; Refill: 0 - nystatin ointment (MYCOSTATIN); Apply 1 Application topically 2 (two) times daily for 14 days. Apply to rashes under bilateral breasts and bilateral groin  Dispense: 28 g; Refill: 0  2. Anxiety *** - LORazepam (ATIVAN) 0.5 MG tablet; Take 1 tablet (0.5 mg total) by mouth every 12 (twelve) hours as needed for anxiety. Agitation.  Give 1 tablet by mouth every 12 hours as needed for agitation for 14 Days  Dispense: 30 tablet; Refill: 0    Family/ staff Communication: ***  Labs/tests ordered:  ***

## 2023-12-12 NOTE — Progress Notes (Signed)
 Location:  [facility] Room Number: 2B Place of Service:  SNF (31) Provider:  Kenard Gower, DNP, FNP-BC  Patient Care Team: Venita Sheffield, MD as PCP - General (Internal Medicine) Mast, Man X, NP as Nurse Practitioner (Internal Medicine)  Extended Emergency Contact Information Primary Emergency Contact: Makarewicz,[name] Address: [address]          [facility] [place], [place] [address] Darden Amber of [place] Home Phone: [phone] Mobile Phone: [phone] Relation: Son Secondary Emergency Contact: Zeoli,Brett Address: [address]          [place], [place] [address] Darden Amber of [place] Home Phone: [phone] Mobile Phone: [phone] Relation: Son  Code Status:   DNR  Goals of care: Advanced Directive information    09/18/2023    8:52 AM  Advanced Directives  Does Patient Have a Medical Advance Directive? Yes  Type of Estate agent of [place];Living will;Out of facility DNR (pink MOST or yellow form)  Does patient want to make changes to medical advance directive? No - Patient declined  Copy of Healthcare Power of Attorney in Chart? Yes - validated most recent copy scanned in chart (See row information)  Pre-existing out of facility DNR order (yellow form or pink MOST form) Pink MOST/Yellow Form most recent copy in chart - Physician notified to receive inpatient order     Chief Complaint  Patient presents with   routine visit    HPI:  Pt is a 89 y.o. female seen today for follow up of chronic medical issues.  She was noted to have erythematous rashes located bilaterally in the groin and under the breasts.  She has  dementia and is currently taking Aricept 5 mg at bedtime. Her latest cognitive assessment shows a BIMS score of 2/15, indicating severe cognitive impairment.  She experiences episodes of anxiety characterized by yelling and is prescribed lorazepam 0.5 mg every 12 hours as needed.  She  has chronic systolic congestive heart failure and is on furosemide 20 mg daily with potassium chloride supplementation. No shortness of breath or edema is reported.  She has hypothyroidism and takes levothyroxine 88 mcg daily on weekdays and 100 mcg daily on weekends. Her latest TSH level, checked on June 12, 2023, was 0.98.  She is also on escitalopram 20 mg daily for depression, and her mood is currently stable.   Past Medical History:  Diagnosis Date   Eczema    Hypothyroid    Hypothyroidism    Hypothyroidism    Vitamin D deficiency    Past Surgical History:  Procedure Laterality Date   CHOLECYSTECTOMY N/A 06/24/2013   Procedure: LAPAROSCOPIC CHOLECYSTECTOMY WITH INTRAOPERATIVE CHOLANGIOGRAM;  Surgeon: Axel Filler, MD;  Location: [facility] OR;  Service: General;  Laterality: N/A;   TONSILLECTOMY      No Known Allergies  Outpatient Encounter Medications as of 12/12/2023  Medication Sig   acetaminophen (TYLENOL) 325 MG tablet Take 650 mg by mouth every 4 (four) hours as needed for fever.   aspirin EC 81 MG tablet Take 81 mg by mouth daily. Swallow whole.   calcium-vitamin D (OSCAL WITH D) 500-200 MG-UNIT tablet Take 1 tablet by mouth 2 (two) times daily.   camphor-menthol (SARNA) lotion Apply 1 Application topically as directed. Apply to neck, abdomen, BLE/BUE topically two times   donepezil (ARICEPT) 5 MG tablet Take 5 mg by mouth at bedtime.   Emollient (AQUAPHOR OINTMENT BODY EX) Apply to Back, Chest, BLE, BUE topically one time a day for Dry Skin;Itching;Rash  related to DERMATITIS, UNSPECIFIED (L30.9) Apply Aquaphor Ointment to rashes on back chest BUE &BLE daily   escitalopram (LEXAPRO) 20 MG tablet Take 20 mg by mouth daily.   fluconazole (DIFLUCAN) 150 MG tablet Take 1 tablet (150 mg total) by mouth once for 1 dose.   lactose free nutrition (BOOST) LIQD Take by mouth daily. nourishment give 1 bottle with 1 cup vanilla ice cream at 10 am   levothyroxine (SYNTHROID) 100 MCG  tablet Take 100 mcg by mouth. one time a day every Sat, Sun   levothyroxine (SYNTHROID) 88 MCG tablet Take 88 mcg by mouth daily. Mon, Tue, Wed, Thu, Fr   loratadine (CLARITIN) 10 MG tablet Take 10 mg by mouth daily.   melatonin 1 MG TABS tablet Take 1 mg by mouth at bedtime.   nystatin ointment (MYCOSTATIN) Apply 1 Application topically 2 (two) times daily for 14 days. Apply to rashes under bilateral breasts and bilateral groin   potassium chloride (MICRO-K) 10 MEQ CR capsule Take 10 mEq by mouth daily.   Sodium Fluoride (PREVIDENT 5000 BOOSTER PLUS) 1.1 % PSTE Place 1 application  onto teeth at bedtime. for oral care USE A PEA SIZE AMOUNT DURING NIGHTTIME OAL CARE. SPIT OUT ANY EXCESS. DO NOT RINSE. DO NOT EAT/DRINK  FOR 30 MINS AFTER   triamcinolone cream (KENALOG) 0.1 % 1 Application 2 (two) times daily as needed (Apply to chest). X 2 week   [DISCONTINUED] LORazepam (ATIVAN) 0.5 MG tablet Take 0.5 mg by mouth every 12 (twelve) hours as needed for anxiety. Agitation.  Give 1 tablet by mouth every 12 hours as needed for agitation for 14 Days   furosemide (LASIX) 20 MG tablet Take 20 mg by mouth daily. (Patient not taking: Reported on 12/12/2023)   Lidocaine & Camphor-Men Lotn 5 & 0.5-0.5 % KIT Apply topically. Apply to neck, abdomen,extremities topically two times a day for Itching (Patient not taking: Reported on 12/12/2023)   LORazepam (ATIVAN) 0.5 MG tablet Take 1 tablet (0.5 mg total) by mouth every 12 (twelve) hours as needed for anxiety. Agitation.  Give 1 tablet by mouth every 12 hours as needed for agitation for 14 Days   oseltamivir (TAMIFLU) 75 MG capsule Take 75 mg by mouth 2 (two) times daily. (Patient not taking: Reported on 09/18/2023)   [DISCONTINUED] acetaminophen (TYLENOL) 650 MG CR tablet Take 650 mg by mouth every 4 (four) hours as needed for pain. (Patient not taking: Reported on 12/12/2023)   [DISCONTINUED] benzonatate (TESSALON) 100 MG capsule Take 200 mg by mouth 2 (two) times daily.  (Patient not taking: Reported on 09/18/2023)   No facility-administered encounter medications on file as of 12/12/2023.    Review of Systems  Unable to obtain due to dementia.    Immunization History  Administered Date(s) Administered   Fluad Quad(high Dose 65+) 07/03/2022, 06/13/2023   Influenza Whole 06/15/2018   Influenza, High Dose Seasonal PF 06/13/2023   Influenza,inj,Quad PF,6+ Mos 06/25/2013   Influenza-Unspecified 06/23/2020, 06/30/2021   Moderna Covid-19 Vaccine Bivalent Booster 43yrs & up 06/27/2023   Moderna SARS-COV2 Booster Vaccination 06/27/2023   Moderna Sars-Covid-2 Vaccination 09/13/2019, 10/11/2019, 07/20/2020, 02/08/2021   PPD Test 08/16/2023   Pfizer Covid-19 Vaccine Bivalent Booster 53yrs & up 05/31/2021, 04/19/2022, 07/13/2022   Pneumococcal Conjugate-13 03/11/2014   Pneumococcal Polysaccharide-23 09/11/1986, 09/11/2001, 06/25/2013   RSV,unspecified 10/29/2022   Tdap 03/16/2011, 12/22/2021   Unspecified SARS-COV-2 Vaccination 05/31/2021   Zoster Recombinant(Shingrix) 09/12/2007, 09/02/2022   Zoster, Unspecified 11/21/2022   Pertinent  Health Maintenance Due  Topic Date Due   INFLUENZA VACCINE  04/11/2024   DEXA SCAN  Completed      01/28/2022    3:26 PM 02/15/2022   11:35 AM 04/07/2022   10:29 AM 08/10/2022    9:16 AM 09/19/2022   11:39 AM  Fall Risk  Falls in the past year?  1 1  0  Was there an injury with Fall?  1 1  0  Fall Risk Category Calculator  3 3  0  Fall Risk Category (Retired)  [employer]  Low  (RETIRED) Patient Fall Risk Level High fall risk High fall risk High fall risk High fall risk Low fall risk  Patient at Risk for Falls Due to  History of fall(s) History of fall(s)  No Fall Risks  Fall risk Follow up  Falls evaluation completed Falls evaluation completed;Education provided  Falls evaluation completed     Vitals:   12/12/23 1026  BP: 126/66  Pulse: (!) 59  Resp: 18  Temp: 97.8 F (36.6 C)  SpO2: 91%  Weight: 156 lb 9.6 oz (71  kg)  Height: 5' (1.524 m)   Body mass index is 30.58 kg/m.  Physical Exam Constitutional:      General: She is not in acute distress.    Appearance: She is obese.  HENT:     Head: Normocephalic and atraumatic.     Nose: Nose normal.     Mouth/Throat:     Mouth: Mucous membranes are moist.  Eyes:     Conjunctiva/sclera: Conjunctivae normal.  Cardiovascular:     Rate and Rhythm: Normal rate and regular rhythm.  Pulmonary:     Effort: Pulmonary effort is normal.     Breath sounds: Normal breath sounds.  Abdominal:     General: Bowel sounds are normal.     Palpations: Abdomen is soft.  Musculoskeletal:        General: Normal range of motion.     Cervical back: Normal range of motion.  Skin:    General: Skin is warm and dry.     Findings: Rash present.     Comments: Erythematous rashes under bilateral breasts and groin  Psychiatric:        Mood and Affect: Mood normal.        Behavior: Behavior normal.        Labs reviewed: Recent Labs    07/10/23 0000 09/11/23 0000 11/01/23 0000  NA 141 142 141  K 4.2 4.1 4.4  CL 103 106 106  CO2 30* 26* 27*  BUN 16 25* 11  CREATININE 0.9 1.0 0.8  CALCIUM 1.4* 8.4* 9.0   Recent Labs    06/12/23 0000  AST 18  ALT 19  ALKPHOS 70  ALBUMIN 3.6   Recent Labs    06/12/23 0000 07/10/23 0000 09/11/23 0000 10/02/23 0000  WBC 9.0 7.9 6.7 8.9  NEUTROABS 5,004.00 4,045.00  --  4,788.00  HGB 15.6 15.4 13.4  --   HCT 48* 46 40 45  PLT 182 203 136* 228   Lab Results  Component Value Date   TSH 1.41 07/10/2023   Lab Results  Component Value Date   HGBA1C 5.7 06/18/2018   Lab Results  Component Value Date   CHOL 188 01/30/2018   HDL 64 01/30/2018   LDLCALC 106 (H) 01/30/2018   TRIG 89 01/30/2018   CHOLHDL 2.9 01/30/2018    Significant Diagnostic Results in last 30 days:  No results found.  Assessment/Plan  1. Candidal skin infection (  Primary) -  Erythematous rashes in groin and under breasts suggest Candida  infection. - Apply nystatin 100,000 units ointment to rashes twice daily for 14 days. - Administer Diflucan 150 mg PO once. - Perform daily skincare to keep area clean and dry. - Re-evaluate after 14 days. - fluconazole (DIFLUCAN) 150 MG tablet; Take 1 tablet (150 mg total) by mouth once for 1 dose.  Dispense: 1 tablet; Refill: 0 - nystatin ointment (MYCOSTATIN); Apply 1 Application topically 2 (two) times daily for 14 days. Apply to rashes under bilateral breasts and bilateral groin  Dispense: 28 g; Refill: 0  2. Dementia with behavioral disturbance (HCC) -  Severe cognitive impairment with BEAM score of 2/15. Managed with Aricept. - Continue Aricept 5 mg at bedtime.  3. Chronic systolic (congestive) heart failure (HCC) -  No shortness of breath or edema. Managed with furosemide and KCL. - Continue furosemide 20 mg daily with KCL supplementation.  4. Anxiety -  Episodes characterized by yelling. Managed with lorazepam. - Reinstate lorazepam 0.5 mg Q12 hours PRN for 14 days. - Re-evaluate after 14 days. - LORazepam (ATIVAN) 0.5 MG tablet; Take 1 tablet (0.5 mg total) by mouth every 12 (twelve) hours as needed for anxiety. Agitation.  Give 1 tablet by mouth every 12 hours as needed for agitation for 14 Days  Dispense: 30 tablet; Refill: 0  5. Depression, recurrent (HCC) -  Mood well-managed with escitalopram. - Continue escitalopram 20 mg daily.  6. Acquired hypothyroidism -  Thyroid function well-managed with levothyroxine. Recent TSH 0.98. - Continue levothyroxine 88 mcg daily on weekdays and 100 mcg daily on weekends.    Family/ staff Communication: Discussed plan of care with charge nurse.  Labs/tests ordered:  None    Kenard Gower, DNP, MSN, FNP-BC [facility] [phone] (Monday-Friday 8:00 a.m. - 5:00 p.m.) [phone] (after hours)

## 2024-01-03 ENCOUNTER — Encounter: Payer: Self-pay | Admitting: Nurse Practitioner

## 2024-01-03 ENCOUNTER — Non-Acute Institutional Stay (SKILLED_NURSING_FACILITY): Payer: Self-pay | Admitting: Nurse Practitioner

## 2024-01-03 DIAGNOSIS — L309 Dermatitis, unspecified: Secondary | ICD-10-CM

## 2024-01-03 DIAGNOSIS — I509 Heart failure, unspecified: Secondary | ICD-10-CM | POA: Diagnosis not present

## 2024-01-03 DIAGNOSIS — F0390 Unspecified dementia without behavioral disturbance: Secondary | ICD-10-CM | POA: Diagnosis not present

## 2024-01-03 DIAGNOSIS — F339 Major depressive disorder, recurrent, unspecified: Secondary | ICD-10-CM | POA: Diagnosis not present

## 2024-01-03 NOTE — Assessment & Plan Note (Signed)
 09/29/22 CXR CHF, 10/01/22 CXR moderate CHF, on Furosemide daily, Bun/creat 17/0.98 09/28/23, 10/06/22 Echo EF 75%

## 2024-01-03 NOTE — Assessment & Plan Note (Signed)
 on Levothyroxine, TSH 1.41 07/10/23

## 2024-01-03 NOTE — Progress Notes (Unsigned)
 Location:   [facility] Room Number: 2B Place of Service:  SNF (31) Provider: Abner Hoffman Judith Campillo NP  Tye Gall, MD  Patient Care Team: Tye Gall, MD as PCP - General (Internal Medicine) Jaylea Plourde X, NP as Nurse Practitioner (Internal Medicine)  Extended Emergency Contact Information Primary Emergency Contact: Yates,[name] Address: [address]          [facility] [place], [place] [address] [place] Home Phone: [phone] Mobile Phone: [phone] Relation: Son Secondary Emergency Contact: Evon,Brett Address: [address]          [place], [place] [address] [place] Home Phone: [phone] Mobile Phone: [phone] Relation: Son  Code Status: DNR Goals of care: Advanced Directive information    09/18/2023    8:52 AM  Advanced Directives  Does Patient Have a Medical Advance Directive? Yes  Type of Estate agent of [place];Living will;Out of facility DNR (pink MOST or yellow form)  Does patient want to make changes to medical advance directive? No - Patient declined  Copy of Healthcare Power of Attorney in Chart? Yes - validated most recent copy scanned in chart (See row information)  Pre-existing out of facility DNR order (yellow form or pink MOST form) Pink MOST/Yellow Form most recent copy in chart - Physician notified to receive inpatient order     Chief Complaint  Patient presents with  . Acute Visit    rash    HPI:  Pt is a 89 y.o. female seen today for an acute visit for diffused itching lightly scaly redness/rash with satellite pattern margin neck, upper chest and back, abd, noted a few around umbilical button, onset and duration are uncertain 2/2 the patient's poor historian.     09/29/22 CXR CHF, 10/01/22 CXR moderate CHF, on Furosemide daily, Bun/creat 17/0.98 09/28/23, 10/06/22 Echo EF 75%             Gait abnormality, uses walker, risk for falling             Dementia, lack of safety  awareness,  resides in SNF [facility] for safety, care assistance, failed discontinuation of  Donepezil               Her mood is stabilizing, but still anxious at times, on Escitalopram , prn Lorazepam              Hypothyroidism, on Levothyroxine , TSH 1.41 07/10/23 Past Medical History:  Diagnosis Date  . Eczema   . Hypothyroid   . Hypothyroidism   . Hypothyroidism   . Vitamin D  deficiency    Past Surgical History:  Procedure Laterality Date  . CHOLECYSTECTOMY N/A 06/24/2013   Procedure: LAPAROSCOPIC CHOLECYSTECTOMY WITH INTRAOPERATIVE CHOLANGIOGRAM;  Surgeon: Shela Derby, MD;  Location: [facility] OR;  Service: General;  Laterality: N/A;  . TONSILLECTOMY      No Known Allergies  Allergies as of 01/03/2024   No Known Allergies      Medication List        Accurate as of January 03, 2024  4:39 PM. If you have any questions, ask your nurse or doctor.          acetaminophen  325 MG tablet Commonly known as: TYLENOL  Take 650 mg by mouth every 4 (four) hours as needed for fever.   AQUAPHOR OINTMENT BODY EX Apply to Back, Chest, BLE, BUE topically one time a day for Dry Skin;Itching;Rash related to DERMATITIS, UNSPECIFIED (L30.9) Apply Aquaphor Ointment to rashes on back chest BUE &BLE daily   aspirin   EC 81 MG tablet Take 81 mg by mouth daily. Swallow whole.   calcium -vitamin D  500-200 MG-UNIT tablet Commonly known as: OSCAL WITH D Take 1 tablet by mouth 2 (two) times daily.   donepezil  5 MG tablet Commonly known as: ARICEPT  Take 5 mg by mouth at bedtime.   escitalopram  20 MG tablet Commonly known as: LEXAPRO  Take 20 mg by mouth daily.   furosemide 20 MG tablet Commonly known as: LASIX Take 20 mg by mouth daily.   lactose free nutrition Liqd Take by mouth daily. nourishment give 1 bottle with 1 cup vanilla ice cream at 10 am   levothyroxine  88 MCG tablet Commonly known as: SYNTHROID  Take 88 mcg by mouth daily. Mon, Tue, Wed, Thu, Fr   levothyroxine  100 MCG  tablet Commonly known as: SYNTHROID  Take 100 mcg by mouth. one time a day every Sat, Sun   Lidocaine  & Camphor-Men Lotn 5 & 0.5-0.5 % Kit Apply topically. Apply to neck, abdomen,extremities topically two times a day for Itching   loratadine 10 MG tablet Commonly known as: CLARITIN Take 10 mg by mouth daily.   LORazepam  0.5 MG tablet Commonly known as: ATIVAN  Take 1 tablet (0.5 mg total) by mouth every 12 (twelve) hours as needed for anxiety. Agitation.  Give 1 tablet by mouth every 12 hours as needed for agitation for 14 Days   melatonin 1 MG Tabs tablet Take 1 mg by mouth at bedtime.   oseltamivir 75 MG capsule Commonly known as: TAMIFLU Take 75 mg by mouth 2 (two) times daily.   potassium chloride  10 MEQ CR capsule Commonly known as: MICRO-K  Take 10 mEq by mouth daily.   PreviDent 5000 Booster Plus 1.1 % Pste Generic drug: Sodium Fluoride Place 1 application  onto teeth at bedtime. for oral care USE A PEA SIZE AMOUNT DURING NIGHTTIME OAL CARE. SPIT OUT ANY EXCESS. DO NOT RINSE. DO NOT EAT/DRINK  FOR 30 MINS AFTER   Sarna lotion Generic drug: camphor-menthol Apply 1 Application topically as directed. Apply to neck, abdomen, BLE/BUE topically two times   triamcinolone cream 0.1 % Commonly known as: KENALOG 1 Application 2 (two) times daily as needed (Apply to chest). X 2 week        Review of Systems  Constitutional:  Negative for appetite change, fatigue and fever.  HENT:  Positive for hearing loss. Negative for congestion and voice change.   Eyes:  Negative for visual disturbance.  Respiratory:  Negative for cough, shortness of breath and wheezing.   Cardiovascular:  Negative for leg swelling.  Gastrointestinal:  Negative for abdominal pain and constipation.  Genitourinary:  Negative for dysuria, flank pain and urgency.  Musculoskeletal:  Positive for gait problem.  Skin:  Positive for rash.  Neurological:  Negative for speech difficulty, weakness and  light-headedness.       Memory lapses.   Psychiatric/Behavioral:  Positive for confusion. Negative for sleep disturbance. The patient is nervous/anxious.     Immunization History  Administered Date(s) Administered  . Fluad Quad(high Dose 65+) 07/03/2022, 06/13/2023  . Influenza Whole 06/15/2018  . Influenza, High Dose Seasonal PF 06/13/2023  . Influenza,inj,Quad PF,6+ Mos 06/25/2013  . Influenza-Unspecified 06/23/2020, 06/30/2021  . Moderna Covid-19 Vaccine Bivalent Booster 25yrs & up 06/27/2023  . Moderna SARS-COV2 Booster Vaccination 06/27/2023  . Moderna Sars-Covid-2 Vaccination 09/13/2019, 10/11/2019, 07/20/2020, 02/08/2021  . PPD Test 08/16/2023  . Pfizer Covid-19 Vaccine Bivalent Booster 60yrs & up 05/31/2021, 04/19/2022, 07/13/2022  . Pneumococcal Conjugate-13 03/11/2014  . Pneumococcal Polysaccharide-23 09/11/1986,  09/11/2001, 06/25/2013  . RSV,unspecified 10/29/2022  . Tdap 03/16/2011, 12/22/2021  . Unspecified SARS-COV-2 Vaccination 05/31/2021  . Zoster Recombinant(Shingrix) 09/12/2007, 09/02/2022  . Zoster, Unspecified 11/21/2022   Pertinent  Health Maintenance Due  Topic Date Due  . INFLUENZA VACCINE  04/11/2024  . DEXA SCAN  Completed      01/28/2022    3:26 PM 02/15/2022   11:35 AM 04/07/2022   10:29 AM 08/10/2022    9:16 AM 09/19/2022   11:39 AM  Fall Risk  Falls in the past year?  1 1  0  Was there an injury with Fall?  1 1  0  Fall Risk Category Calculator  3 3  0  Fall Risk Category (Retired)  [employer]  Low  (RETIRED) Patient Fall Risk Level High fall risk High fall risk High fall risk High fall risk Low fall risk  Patient at Risk for Falls Due to  History of fall(s) History of fall(s)  No Fall Risks  Fall risk Follow up  Falls evaluation completed Falls evaluation completed;Education provided  Falls evaluation completed   Functional Status Survey:    Vitals:   01/03/24 1638  BP: (!) 116/58  Pulse: (!) 54  Resp: 17  Temp: 97.8 F (36.6 C)  SpO2:  91%  Weight: 156 lb 9.6 oz (71 kg)   Body mass index is 30.58 kg/m. Physical Exam Vitals and nursing note reviewed.  Constitutional:      Appearance: Normal appearance.  HENT:     Head: Normocephalic and atraumatic.     Nose: No congestion.     Mouth/Throat:     Mouth: Mucous membranes are moist.  Eyes:     Extraocular Movements: Extraocular movements intact.     Conjunctiva/sclera: Conjunctivae normal.     Pupils: Pupils are equal, round, and reactive to light.  Cardiovascular:     Rate and Rhythm: Normal rate and regular rhythm.     Heart sounds: No murmur heard. Pulmonary:     Effort: Pulmonary effort is normal.     Breath sounds: No rales.  Abdominal:     General: Bowel sounds are normal.     Palpations: Abdomen is soft.     Tenderness: There is no abdominal tenderness.     Hernia: A hernia is present.     Comments: Umbilical hernia.   Musculoskeletal:     Cervical back: Normal range of motion and neck supple.     Right lower leg: No edema.     Left lower leg: No edema.  Skin:    General: Skin is warm and dry.     Findings: Rash present.     Comments: diffused itching lightly scaly redness/rash with satellite pattern margin neck, upper chest and back,  a few around umbilical button, onset and duration are uncertain 2/2 the patient's poor historian.   Neurological:     General: No focal deficit present.     Mental Status: She is alert. Mental status is at baseline.     Gait: Gait abnormal.     Comments: Oriented to person, place.   Psychiatric:        Mood and Affect: Mood normal.        Behavior: Behavior normal.    Labs reviewed: Recent Labs    09/11/23 0000 09/28/23 0000 11/01/23 0000  NA 142 140 141  K 4.1 4.2 4.4  CL 106 105 106  CO2 26* 25* 27*  BUN 25* 17 11  CREATININE 1.0 1.0  0.8  CALCIUM  8.4* 9.2 9.0   Recent Labs    06/12/23 0000 09/28/23 0000  AST 18 22  ALT 19 14  ALKPHOS 70 109  ALBUMIN 3.6 3.2*   Recent Labs    07/10/23 0000  09/11/23 0000 09/28/23 0000 10/02/23 0000  WBC 7.9 6.7 25.9 8.9  NEUTROABS 4,045.00  --  21,471.00 4,788.00  HGB 15.4 13.4 14.8  --   HCT 46 40 43 45  PLT 203 136* 209 228   Lab Results  Component Value Date   TSH 1.41 07/10/2023   Lab Results  Component Value Date   HGBA1C 5.7 06/18/2018   Lab Results  Component Value Date   CHOL 188 01/30/2018   HDL 64 01/30/2018   LDLCALC 106 (H) 01/30/2018   TRIG 89 01/30/2018   CHOLHDL 2.9 01/30/2018    Significant Diagnostic Results in last 30 days:  No results found.  Assessment/Plan: Dermatitis diffused itching lightly scaly redness/rash with satellite pattern margin neck, upper chest and back, abd, noted a few around umbilical button, onset and duration are uncertain 2/2 the patient's poor historian.  2% Ketoconazole cream, 0.1% Triamcinolone cream bid to affected areas x 10 days, Prednisone 20mg  every day x 5 days.   CHF (congestive heart failure) (HCC) 09/29/22 CXR CHF, 10/01/22 CXR moderate CHF, on Furosemide daily, Bun/creat 17/0.98 09/28/23, 10/06/22 Echo EF 75%  Dementia without behavioral disturbance (HCC)  lack of safety awareness,  resides in SN[facility] for safety, care assistance, failed discontinuation of  Donepezil    Depression, recurrent (HCC) Her mood is stabilizing, but still anxious at times, on Escitalopram , prn Lorazepam   Hypothyroidism on Levothyroxine , TSH 1.41 07/10/23    Family/ staff Communication: plan of care reviewed with the patient and charge nurse.   Labs/tests ordered:  none

## 2024-01-03 NOTE — Assessment & Plan Note (Signed)
 diffused itching lightly scaly redness/rash with satellite pattern margin neck, upper chest and back, abd, noted a few around umbilical button, onset and duration are uncertain 2/2 the patient's poor historian.  2% Ketoconazole cream, 0.1% Triamcinolone cream bid to affected areas x 10 days, Prednisone 20mg  every day x 5 days.

## 2024-01-03 NOTE — Assessment & Plan Note (Signed)
Her mood is stabilizing, but still anxious at times, on Escitalopram, prn Lorazepam 

## 2024-01-03 NOTE — Assessment & Plan Note (Signed)
 lack of safety awareness,  resides in SNF [facility] for safety, care assistance, failed discontinuation of  Donepezil

## 2024-01-08 ENCOUNTER — Encounter: Payer: Self-pay | Admitting: Nurse Practitioner

## 2024-01-08 ENCOUNTER — Non-Acute Institutional Stay (SKILLED_NURSING_FACILITY): Admitting: Nurse Practitioner

## 2024-01-08 DIAGNOSIS — F0390 Unspecified dementia without behavioral disturbance: Secondary | ICD-10-CM

## 2024-01-08 DIAGNOSIS — I509 Heart failure, unspecified: Secondary | ICD-10-CM | POA: Diagnosis not present

## 2024-01-08 DIAGNOSIS — F339 Major depressive disorder, recurrent, unspecified: Secondary | ICD-10-CM

## 2024-01-08 DIAGNOSIS — E039 Hypothyroidism, unspecified: Secondary | ICD-10-CM

## 2024-01-08 DIAGNOSIS — L309 Dermatitis, unspecified: Secondary | ICD-10-CM

## 2024-01-08 NOTE — Assessment & Plan Note (Signed)
 lack of safety awareness,  resides in SNF [facility] for safety, care assistance, failed discontinuation of  Donepezil

## 2024-01-08 NOTE — Assessment & Plan Note (Signed)
 on Levothyroxine, TSH 1.41 07/10/23

## 2024-01-08 NOTE — Assessment & Plan Note (Signed)
 09/29/22 CXR CHF, 10/01/22 CXR moderate CHF, on Furosemide daily, Bun/creat 17/0.98 09/28/23, 10/06/22 Echo EF 75%, compensated clinically.

## 2024-01-08 NOTE — Assessment & Plan Note (Signed)
Her mood is stabilizing, but still anxious at times, on Escitalopram, prn Lorazepam 

## 2024-01-08 NOTE — Progress Notes (Unsigned)
 Location:   [facility] Room Number: 2B Place of Service:  SNF (31) Provider: Abner Hoffman Von Inscoe NP  Tye Gall, MD  Patient Care Team: Tye Gall, MD as PCP - General (Internal Medicine) Chanese Hartsough X, NP as Nurse Practitioner (Internal Medicine)  Extended Emergency Contact Information Primary Emergency Contact: Nessler,[name] Address: [address]          [facility] [place], [place] [address] [place] Home Phone: [phone] Mobile Phone: [phone] Relation: Son Secondary Emergency Contact: Chiong,Brett Address: [address]          [place], [place] [address] [place] Home Phone: [phone] Mobile Phone: [phone] Relation: Son  Code Status:  DNR Goals of care: Advanced Directive information    09/18/2023    8:52 AM  Advanced Directives  Does Patient Have a Medical Advance Directive? Yes  Type of Estate agent of [place];Living will;Out of facility DNR (pink MOST or yellow form)  Does patient want to make changes to medical advance directive? No - Patient declined  Copy of Healthcare Power of Attorney in Chart? Yes - validated most recent copy scanned in chart (See row information)  Pre-existing out of facility DNR order (yellow form or pink MOST form) Pink MOST/Yellow Form most recent copy in chart - Physician notified to receive inpatient order     Chief Complaint  Patient presents with  . Medical Management of Chronic Issues    HPI:  Pt is a 89 y.o. female seen today for medical management of chronic diseases.    Skin rash/Eczema, resolution on oral prednisone, topical Triamcinolone and Ketoconazole.   09/29/22 CXR CHF, 10/01/22 CXR moderate CHF, on Furosemide daily, Bun/creat 17/0.98 09/28/23, 10/06/22 Echo EF 75%             Gait abnormality, w/c for mobility, , risk for falling             Dementia, lack of safety awareness,  resides in SNF [facility] for safety, care assistance, failed  discontinuation of  Donepezil               Her mood is stabilizing, but still anxious at times, on Escitalopram , prn Lorazepam              Hypothyroidism, on Levothyroxine , TSH 1.41 07/10/23  Past Medical History:  Diagnosis Date  . Eczema   . Hypothyroid   . Hypothyroidism   . Hypothyroidism   . Vitamin D  deficiency    Past Surgical History:  Procedure Laterality Date  . CHOLECYSTECTOMY N/A 06/24/2013   Procedure: LAPAROSCOPIC CHOLECYSTECTOMY WITH INTRAOPERATIVE CHOLANGIOGRAM;  Surgeon: Shela Derby, MD;  Location: [facility] OR;  Service: General;  Laterality: N/A;  . TONSILLECTOMY      No Known Allergies  Allergies as of 01/08/2024   No Known Allergies      Medication List        Accurate as of January 08, 2024 11:59 PM. If you have any questions, ask your nurse or doctor.          acetaminophen  325 MG tablet Commonly known as: TYLENOL  Take 650 mg by mouth every 4 (four) hours as needed for fever.   AQUAPHOR OINTMENT BODY EX Apply to Back, Chest, BLE, BUE topically one time a day for Dry Skin;Itching;Rash related to DERMATITIS, UNSPECIFIED (L30.9) Apply Aquaphor Ointment to rashes on back chest BUE &BLE daily   aspirin  EC 81 MG tablet Take 81 mg by mouth daily. Swallow whole.   calcium -vitamin D  500-200  MG-UNIT tablet Commonly known as: OSCAL WITH D Take 1 tablet by mouth 2 (two) times daily.   donepezil  5 MG tablet Commonly known as: ARICEPT  Take 5 mg by mouth at bedtime.   escitalopram  20 MG tablet Commonly known as: LEXAPRO  Take 20 mg by mouth daily.   furosemide 20 MG tablet Commonly known as: LASIX Take 20 mg by mouth daily.   lactose free nutrition Liqd Take by mouth daily. nourishment give 1 bottle with 1 cup vanilla ice cream at 10 am   levothyroxine  88 MCG tablet Commonly known as: SYNTHROID  Take 88 mcg by mouth daily. Mon, Tue, Wed, Thu, Fr   levothyroxine  100 MCG tablet Commonly known as: SYNTHROID  Take 100 mcg by mouth. one time a day  every Sat, Sun   Lidocaine  & Camphor-Men Lotn 5 & 0.5-0.5 % Kit Apply topically. Apply to neck, abdomen,extremities topically two times a day for Itching   loratadine 10 MG tablet Commonly known as: CLARITIN Take 10 mg by mouth daily.   LORazepam  0.5 MG tablet Commonly known as: ATIVAN  Take 1 tablet (0.5 mg total) by mouth every 12 (twelve) hours as needed for anxiety. Agitation.  Give 1 tablet by mouth every 12 hours as needed for agitation for 14 Days   melatonin 1 MG Tabs tablet Take 1 mg by mouth at bedtime.   oseltamivir 75 MG capsule Commonly known as: TAMIFLU Take 75 mg by mouth 2 (two) times daily.   potassium chloride  10 MEQ CR capsule Commonly known as: MICRO-K  Take 10 mEq by mouth daily.   PreviDent 5000 Booster Plus 1.1 % Pste Generic drug: Sodium Fluoride Place 1 application  onto teeth at bedtime. for oral care USE A PEA SIZE AMOUNT DURING NIGHTTIME OAL CARE. SPIT OUT ANY EXCESS. DO NOT RINSE. DO NOT EAT/DRINK  FOR 30 MINS AFTER   Sarna lotion Generic drug: camphor-menthol Apply 1 Application topically as directed. Apply to neck, abdomen, BLE/BUE topically two times   triamcinolone cream 0.1 % Commonly known as: KENALOG 1 Application 2 (two) times daily as needed (Apply to chest). X 2 week        Review of Systems  Constitutional:  Negative for appetite change, fatigue and fever.  HENT:  Positive for hearing loss. Negative for congestion and voice change.   Eyes:  Negative for visual disturbance.  Respiratory:  Negative for cough, shortness of breath and wheezing.   Cardiovascular:  Negative for leg swelling.  Gastrointestinal:  Negative for abdominal pain and constipation.  Genitourinary:  Negative for dysuria, flank pain and urgency.  Musculoskeletal:  Positive for gait problem.  Skin:  Negative for rash.  Neurological:  Negative for speech difficulty, weakness and light-headedness.       Memory lapses.   Psychiatric/Behavioral:  Positive for  confusion. Negative for sleep disturbance. The patient is nervous/anxious.     Immunization History  Administered Date(s) Administered  . Fluad Quad(high Dose 65+) 07/03/2022, 06/13/2023  . Influenza Whole 06/15/2018  . Influenza, High Dose Seasonal PF 06/13/2023  . Influenza,inj,Quad PF,6+ Mos 06/25/2013  . Influenza-Unspecified 06/23/2020, 06/30/2021  . Moderna Covid-19 Vaccine Bivalent Booster 75yrs & up 06/27/2023  . Moderna SARS-COV2 Booster Vaccination 06/27/2023  . Moderna Sars-Covid-2 Vaccination 09/13/2019, 10/11/2019, 07/20/2020, 02/08/2021  . PPD Test 08/16/2023  . Pfizer Covid-19 Vaccine Bivalent Booster 72yrs & up 05/31/2021, 04/19/2022, 07/13/2022  . Pneumococcal Conjugate-13 03/11/2014  . Pneumococcal Polysaccharide-23 09/11/1986, 09/11/2001, 06/25/2013  . RSV,unspecified 10/29/2022  . Tdap 03/16/2011, 12/22/2021  . Unspecified SARS-COV-2 Vaccination 05/31/2021  .  Zoster Recombinant(Shingrix) 09/12/2007, 09/02/2022  . Zoster, Unspecified 11/21/2022   Pertinent  Health Maintenance Due  Topic Date Due  . INFLUENZA VACCINE  04/11/2024  . DEXA SCAN  Completed      01/28/2022    3:26 PM 02/15/2022   11:35 AM 04/07/2022   10:29 AM 08/10/2022    9:16 AM 09/19/2022   11:39 AM  Fall Risk  Falls in the past year?  1 1  0  Was there an injury with Fall?  1 1  0  Fall Risk Category Calculator  3 3  0  Fall Risk Category (Retired)  [employer]  Low  (RETIRED) Patient Fall Risk Level High fall risk High fall risk High fall risk High fall risk Low fall risk  Patient at Risk for Falls Due to  History of fall(s) History of fall(s)  No Fall Risks  Fall risk Follow up  Falls evaluation completed Falls evaluation completed;Education provided  Falls evaluation completed   Functional Status Survey:    Vitals:   01/08/24 1545  BP: (!) 129/56  Pulse: 66  Resp: 18  Temp: (!) 97 F (36.1 C)  SpO2: 91%  Weight: 156 lb 9.6 oz (71 kg)   Body mass index is 30.58 kg/m. Physical  Exam Vitals and nursing note reviewed.  Constitutional:      Appearance: Normal appearance.  HENT:     Head: Normocephalic and atraumatic.     Nose: No congestion.     Mouth/Throat:     Mouth: Mucous membranes are moist.  Eyes:     Extraocular Movements: Extraocular movements intact.     Conjunctiva/sclera: Conjunctivae normal.     Pupils: Pupils are equal, round, and reactive to light.  Cardiovascular:     Rate and Rhythm: Normal rate and regular rhythm.     Heart sounds: No murmur heard. Pulmonary:     Effort: Pulmonary effort is normal.     Breath sounds: No rales.  Abdominal:     General: Bowel sounds are normal.     Palpations: Abdomen is soft.     Tenderness: There is no abdominal tenderness.     Hernia: A hernia is present.     Comments: Umbilical hernia.   Musculoskeletal:     Cervical back: Normal range of motion and neck supple.     Right lower leg: No edema.     Left lower leg: No edema.  Skin:    General: Skin is warm and dry.     Findings: No rash.     Comments: diffused itching lightly scaly redness/rash with satellite pattern margin neck, upper chest and back,  a few around umbilical button, near resolution.   Neurological:     General: No focal deficit present.     Mental Status: She is alert. Mental status is at baseline.     Gait: Gait abnormal.     Comments: Oriented to person, place.   Psychiatric:        Mood and Affect: Mood normal.        Behavior: Behavior normal.    Labs reviewed: Recent Labs    09/11/23 0000 09/28/23 0000 11/01/23 0000  NA 142 140 141  K 4.1 4.2 4.4  CL 106 105 106  CO2 26* 25* 27*  BUN 25* 17 11  CREATININE 1.0 1.0 0.8  CALCIUM  8.4* 9.2 9.0   Recent Labs    06/12/23 0000 09/28/23 0000  AST 18 22  ALT 19 14  ALKPHOS  70 109  ALBUMIN 3.6 3.2*   Recent Labs    07/10/23 0000 09/11/23 0000 09/28/23 0000 10/02/23 0000  WBC 7.9 6.7 25.9 8.9  NEUTROABS 4,045.00  --  21,471.00 4,788.00  HGB 15.4 13.4 14.8  --    HCT 46 40 43 45  PLT 203 136* 209 228   Lab Results  Component Value Date   TSH 1.41 07/10/2023   Lab Results  Component Value Date   HGBA1C 5.7 06/18/2018   Lab Results  Component Value Date   CHOL 188 01/30/2018   HDL 64 01/30/2018   LDLCALC 106 (H) 01/30/2018   TRIG 89 01/30/2018   CHOLHDL 2.9 01/30/2018    Significant Diagnostic Results in last 30 days:  No results found.  Assessment/Plan  Depression, recurrent (HCC) Her mood is stabilizing, but still anxious at times, on Escitalopram , prn Lorazepam   Hypothyroidism on Levothyroxine , TSH 1.41 07/10/23  Dementia without behavioral disturbance (HCC)  lack of safety awareness,  resides in SNF [facility] for safety, care assistance, failed discontinuation of  Donepezil    CHF (congestive heart failure) (HCC) 09/29/22 CXR CHF, 10/01/22 CXR moderate CHF, on Furosemide daily, Bun/creat 17/0.98 09/28/23, 10/06/22 Echo EF 75%, compensated clinically.   Dermatitis  Near resolution on oral prednisone, topical Triamcinolone and Ketoconazole.    Family/ staff Communication: plan of care reviewed with the patient and charge nurse.   Labs/tests ordered:  none

## 2024-01-08 NOTE — Assessment & Plan Note (Signed)
 Near resolution on oral prednisone, topical Triamcinolone and Ketoconazole.

## 2024-01-22 ENCOUNTER — Non-Acute Institutional Stay (SKILLED_NURSING_FACILITY): Admitting: Nurse Practitioner

## 2024-01-22 ENCOUNTER — Encounter: Payer: Self-pay | Admitting: Nurse Practitioner

## 2024-01-22 DIAGNOSIS — F0390 Unspecified dementia without behavioral disturbance: Secondary | ICD-10-CM

## 2024-01-22 DIAGNOSIS — R296 Repeated falls: Secondary | ICD-10-CM

## 2024-01-22 DIAGNOSIS — J209 Acute bronchitis, unspecified: Secondary | ICD-10-CM | POA: Diagnosis not present

## 2024-01-22 DIAGNOSIS — F339 Major depressive disorder, recurrent, unspecified: Secondary | ICD-10-CM

## 2024-01-22 DIAGNOSIS — I509 Heart failure, unspecified: Secondary | ICD-10-CM | POA: Diagnosis not present

## 2024-01-22 NOTE — Assessment & Plan Note (Signed)
Her mood is stabilizing, but still anxious at times, on Escitalopram, prn Lorazepam 

## 2024-01-22 NOTE — Assessment & Plan Note (Signed)
 wheezing cough started yesterday afternoon, negative Covid/Flu tested. Afebrile, no O2 desaturation. The patient was seen lying on the sofa.  CXR ap/lateral r/o PNA Zpk, Tessalon 100mg  tid x 2 days, Prednisone 20mg  every day x 3 days(higher dose cause severe agitation), DuoNeb q8hr x 3 days.

## 2024-01-22 NOTE — Assessment & Plan Note (Signed)
 lack of safety awareness,  resides in SNF [facility] for safety, care assistance, failed discontinuation of  Donepezil

## 2024-01-22 NOTE — Assessment & Plan Note (Addendum)
 09/29/22 CXR CHF, 10/01/22 CXR moderate CHF, on Furosemide daily, 10/06/22 Echo EF 75%, Bun/creat 11/0.82 11/01/23

## 2024-01-22 NOTE — Progress Notes (Signed)
 Location:   [facility] Room Number: 2B Place of Service:  SNF (31) Provider: Abner Hoffman Rayleigh Gillyard NP  Tye Gall, MD  Patient Care Team: Tye Gall, MD as PCP - General (Internal Medicine) Yanni Ruberg X, NP as Nurse Practitioner (Internal Medicine)  Extended Emergency Contact Information Primary Emergency Contact: Lepak,[name] Address: [address]          [facility] [place], [place] [address] [place] Home Phone: [phone] Mobile Phone: [phone] Relation: Son Secondary Emergency Contact: Kemppainen,Brett Address: [address]          [place], [place] [address] [place] Home Phone: [phone] Mobile Phone: [phone] Relation: Son  Code Status: DNR Goals of care: Advanced Directive information    09/18/2023    8:52 AM  Advanced Directives  Does Patient Have a Medical Advance Directive? Yes  Type of Estate agent of [place];Living will;Out of facility DNR (pink MOST or yellow form)  Does patient want to make changes to medical advance directive? No - Patient declined  Copy of Healthcare Power of Attorney in Chart? Yes - validated most recent copy scanned in chart (See row information)  Pre-existing out of facility DNR order (yellow form or pink MOST form) Pink MOST/Yellow Form most recent copy in chart - Physician notified to receive inpatient order     Chief Complaint  Patient presents with   Acute Visit    Wheezing cough started yesterday afternoon, negative Covid/Flu tested    HPI:  Pt is a 89 y.o. female seen today for an acute visit for wheezing cough started yesterday afternoon, negative Covid/Flu tested. Afebrile, no O2 desaturation. The patient was seen lying on the sofa.    Skin rash/Eczema, resolved on oral prednisone, topical Triamcinolone and Ketoconazole.              09/29/22 CXR CHF, 10/01/22 CXR moderate CHF, on Furosemide daily, Bun/creat 11/0.82 11/01/23, 10/06/22 Echo EF 75%              Gait abnormality, w/c for mobility, risk for falling  Frequent fall: 2//2 lack of safety awareness, increased frailty, 11/03/23 fall, 01/03/24 fall, 01/18/24 fall. Needs close supervision and assistance              Dementia, lack of safety awareness,  resides in SNF [facility] for safety, care assistance, failed discontinuation of  Donepezil               Her mood is stabilizing, but still anxious at times, on Escitalopram , prn Lorazepam              Hypothyroidism, on Levothyroxine , TSH 1.41 07/10/23   Past Medical History:  Diagnosis Date   Eczema    Hypothyroid    Hypothyroidism    Hypothyroidism    Vitamin D  deficiency    Past Surgical History:  Procedure Laterality Date   CHOLECYSTECTOMY N/A 06/24/2013   Procedure: LAPAROSCOPIC CHOLECYSTECTOMY WITH INTRAOPERATIVE CHOLANGIOGRAM;  Surgeon: Shela Derby, MD;  Location: [facility];  Service: General;  Laterality: N/A;   TONSILLECTOMY      No Known Allergies  Allergies as of 01/22/2024   No Known Allergies      Medication List        Accurate as of [name] 22, 2024 11:59 PM. If you have any questions, ask your nurse or doctor.          acetaminophen  325 MG tablet Commonly known as: TYLENOL  Take 650 mg by mouth every 4 (four) hours as  needed for fever.   AQUAPHOR OINTMENT BODY EX Apply to Back, Chest, BLE, BUE topically one time a day for Dry Skin;Itching;Rash related to DERMATITIS, UNSPECIFIED (L30.9) Apply Aquaphor Ointment to rashes on back chest BUE &BLE daily   aspirin  EC 81 MG tablet Take 81 mg by mouth daily. Swallow whole.   calcium -vitamin D  500-200 MG-UNIT tablet Commonly known as: OSCAL WITH D Take 1 tablet by mouth 2 (two) times daily.   donepezil  5 MG tablet Commonly known as: ARICEPT  Take 5 mg by mouth at bedtime.   escitalopram  20 MG tablet Commonly known as: LEXAPRO  Take 20 mg by mouth daily.   furosemide 20 MG tablet Commonly known as: LASIX Take 20 mg by mouth daily.   lactose free nutrition  Liqd Take by mouth daily. nourishment give 1 bottle with 1 cup vanilla ice cream at 10 am   levothyroxine  88 MCG tablet Commonly known as: SYNTHROID  Take 88 mcg by mouth daily. Mon, Tue, Wed, Thu, Fr   levothyroxine  100 MCG tablet Commonly known as: SYNTHROID  Take 100 mcg by mouth. one time a day every Sat, Sun   Lidocaine  & Camphor-Men Lotn 5 & 0.5-0.5 % Kit Apply topically. Apply to neck, abdomen,extremities topically two times a day for Itching   loratadine 10 MG tablet Commonly known as: CLARITIN Take 10 mg by mouth daily.   LORazepam  0.5 MG tablet Commonly known as: ATIVAN  Take 1 tablet (0.5 mg total) by mouth every 12 (twelve) hours as needed for anxiety. Agitation.  Give 1 tablet by mouth every 12 hours as needed for agitation for 14 Days   melatonin 1 MG Tabs tablet Take 1 mg by mouth at bedtime.   oseltamivir 75 MG capsule Commonly known as: TAMIFLU Take 75 mg by mouth 2 (two) times daily.   potassium chloride  10 MEQ CR capsule Commonly known as: MICRO-K  Take 10 mEq by mouth daily.   PreviDent 5000 Booster Plus 1.1 % Pste Generic drug: Sodium Fluoride Place 1 application  onto teeth at bedtime. for oral care USE A PEA SIZE AMOUNT DURING NIGHTTIME OAL CARE. SPIT OUT ANY EXCESS. DO NOT RINSE. DO NOT EAT/DRINK  FOR 30 MINS AFTER   Sarna lotion Generic drug: camphor-menthol Apply 1 Application topically as directed. Apply to neck, abdomen, BLE/BUE topically two times   triamcinolone cream 0.1 % Commonly known as: KENALOG 1 Application 2 (two) times daily as needed (Apply to chest). X 2 week        Review of Systems  Constitutional:  Positive for fatigue. Negative for appetite change and fever.  HENT:  Positive for hearing loss. Negative for congestion, rhinorrhea, sinus pressure, sore throat, trouble swallowing and voice change.   Eyes:  Negative for visual disturbance.  Respiratory:  Positive for cough and wheezing. Negative for chest tightness and shortness  of breath.   Cardiovascular:  Negative for leg swelling.  Gastrointestinal:  Negative for abdominal pain and constipation.  Genitourinary:  Negative for dysuria, flank pain and urgency.  Musculoskeletal:  Positive for gait problem.  Skin:  Negative for color change.  Neurological:  Negative for speech difficulty, weakness and light-headedness.       Memory lapses.   Psychiatric/Behavioral:  Positive for confusion. Negative for sleep disturbance. The patient is nervous/anxious.     Immunization History  Administered Date(s) Administered   Fluad Quad(high Dose 65+) 07/03/2022, 06/13/2023   Influenza Whole 06/15/2018   Influenza, High Dose Seasonal PF 06/13/2023   Influenza,inj,Quad PF,6+ Mos 06/25/2013   Influenza-Unspecified  06/23/2020, 06/30/2021   Moderna Covid-19 Vaccine Bivalent Booster 40yrs & up 06/27/2023   Moderna SARS-COV2 Booster Vaccination 06/27/2023   Moderna Sars-Covid-2 Vaccination 09/13/2019, 10/11/2019, 07/20/2020, 02/08/2021   PPD Test 08/16/2023   Pfizer Covid-19 Vaccine Bivalent Booster 52yrs & up 05/31/2021, 04/19/2022, 07/13/2022   Pneumococcal Conjugate-13 03/11/2014   Pneumococcal Polysaccharide-23 09/11/1986, 09/11/2001, 06/25/2013   RSV,unspecified 10/29/2022   Tdap 03/16/2011, 12/22/2021   Unspecified SARS-COV-2 Vaccination 05/31/2021   Zoster Recombinant(Shingrix) 09/12/2007, 09/02/2022   Zoster, Unspecified 11/21/2022   Pertinent  Health Maintenance Due  Topic Date Due   INFLUENZA VACCINE  04/11/2024   DEXA SCAN  Completed      01/28/2022    3:26 PM 02/15/2022   11:35 AM 04/07/2022   10:29 AM 08/10/2022    9:16 AM 09/19/2022   11:39 AM  Fall Risk  Falls in the past year?  1 1  0  Was there an injury with Fall?  1 1  0  Fall Risk Category Calculator  3 3  0  Fall Risk Category (Retired)  [employer]  Low  (RETIRED) Patient Fall Risk Level High fall risk High fall risk High fall risk High fall risk Low fall risk  Patient at Risk for Falls Due to   History of fall(s) History of fall(s)  No Fall Risks  Fall risk Follow up  Falls evaluation completed Falls evaluation completed;Education provided  Falls evaluation completed   Functional Status Survey:    Vitals:   01/22/24 1058  BP: 108/60  Pulse: 64  Resp: 18  Temp: 97.6 F (36.4 C)  SpO2: 97%  Weight: 153 lb 3.2 oz (69.5 kg)   Body mass index is 29.92 kg/m. Physical Exam Vitals and nursing note reviewed.  Constitutional:      Comments: Lying on sofa   HENT:     Head: Normocephalic and atraumatic.     Nose: No congestion.     Mouth/Throat:     Mouth: Mucous membranes are moist.  Eyes:     Extraocular Movements: Extraocular movements intact.     Conjunctiva/sclera: Conjunctivae normal.     Pupils: Pupils are equal, round, and reactive to light.  Cardiovascular:     Rate and Rhythm: Normal rate and regular rhythm.     Heart sounds: No murmur heard. Pulmonary:     Effort: Pulmonary effort is normal.     Breath sounds: Wheezing and rhonchi present. No rales.     Comments: Central congestion, diffused rhonchi and wheezes Abdominal:     General: Bowel sounds are normal.     Palpations: Abdomen is soft.     Tenderness: There is no abdominal tenderness.     Hernia: A hernia is present.     Comments: Umbilical hernia.   Musculoskeletal:     Cervical back: Normal range of motion and neck supple.     Right lower leg: No edema.     Left lower leg: No edema.  Skin:    General: Skin is warm and dry.  Neurological:     General: No focal deficit present.     Mental Status: She is alert. Mental status is at baseline.     Gait: Gait abnormal.     Comments: Oriented to person, place.   Psychiatric:        Mood and Affect: Mood normal.        Behavior: Behavior normal.     Labs reviewed: Recent Labs    09/11/23 0000 09/28/23 0000 11/01/23 0000  NA 142 140 141  K 4.1 4.2 4.4  CL 106 105 106  CO2 26* 25* 27*  BUN 25* 17 11  CREATININE 1.0 1.0 0.8  CALCIUM  8.4*  9.2 9.0   Recent Labs    06/12/23 0000 09/28/23 0000  AST 18 22  ALT 19 14  ALKPHOS 70 109  ALBUMIN 3.6 3.2*   Recent Labs    07/10/23 0000 09/11/23 0000 09/28/23 0000 10/02/23 0000  WBC 7.9 6.7 25.9 8.9  NEUTROABS 4,045.00  --  21,471.00 4,788.00  HGB 15.4 13.4 14.8  --   HCT 46 40 43 45  PLT 203 136* 209 228   Lab Results  Component Value Date   TSH 1.41 07/10/2023   Lab Results  Component Value Date   HGBA1C 5.7 06/18/2018   Lab Results  Component Value Date   CHOL 188 01/30/2018   HDL 64 01/30/2018   LDLCALC 106 (H) 01/30/2018   TRIG 89 01/30/2018   CHOLHDL 2.9 01/30/2018    Significant Diagnostic Results in last 30 days:  No results found.  Assessment/Plan: Acute bronchitis wheezing cough started yesterday afternoon, negative Covid/Flu tested. Afebrile, no O2 desaturation. The patient was seen lying on the sofa.  CXR ap/lateral r/o PNA Zpk, Tessalon 100mg  tid x 2 days, Prednisone 20mg  every day x 3 days(higher dose cause severe agitation), DuoNeb q8hr x 3 days.   CHF (congestive heart failure) (HCC) 09/29/22 CXR CHF, 10/01/22 CXR moderate CHF, on Furosemide daily, 10/06/22 Echo EF 75%, Bun/creat 11/0.82 11/01/23  Dementia without behavioral disturbance (HCC) lack of safety awareness,  resides in SNF [facility] for safety, care assistance, failed discontinuation of  Donepezil    Depression, recurrent (HCC) Her mood is stabilizing, but still anxious at times, on Escitalopram , prn Lorazepam   Falls frequently  2//2 lack of safety awareness, increased frailty, 11/03/23 fall, 01/03/24 fall, 01/18/24 fall. Needs close supervision and assistance     Family/ staff Communication: plan of care reviewed with the patient and charge nurse.   Labs/tests ordered:  CXR ap/lateral.

## 2024-01-24 DIAGNOSIS — R296 Repeated falls: Secondary | ICD-10-CM | POA: Insufficient documentation

## 2024-01-24 NOTE — Assessment & Plan Note (Signed)
 2//2 lack of safety awareness, increased frailty, 11/03/23 fall, 01/03/24 fall, 01/18/24 fall. Needs close supervision and assistance

## 2024-01-28 ENCOUNTER — Encounter: Payer: Self-pay | Admitting: Nurse Practitioner

## 2024-01-28 ENCOUNTER — Non-Acute Institutional Stay (SKILLED_NURSING_FACILITY): Payer: Self-pay | Admitting: Nurse Practitioner

## 2024-01-28 DIAGNOSIS — E039 Hypothyroidism, unspecified: Secondary | ICD-10-CM

## 2024-01-28 DIAGNOSIS — F0390 Unspecified dementia without behavioral disturbance: Secondary | ICD-10-CM | POA: Diagnosis not present

## 2024-01-28 DIAGNOSIS — J209 Acute bronchitis, unspecified: Secondary | ICD-10-CM

## 2024-01-28 DIAGNOSIS — I509 Heart failure, unspecified: Secondary | ICD-10-CM | POA: Diagnosis not present

## 2024-01-28 DIAGNOSIS — F339 Major depressive disorder, recurrent, unspecified: Secondary | ICD-10-CM | POA: Diagnosis not present

## 2024-01-28 NOTE — Assessment & Plan Note (Signed)
 on Levothyroxine, TSH 1.41 07/10/23

## 2024-01-28 NOTE — Assessment & Plan Note (Addendum)
 has been treated Zpk, DuoNeb q8hr x 3days, Tessalon tid x2 days, Prednisone 20mg  every day x3 days, CXR no PNA, some CHF.  persisted productive cough with episodes of turning red, difficulty catching breath. Continue 3 day course of Prednisone 20mg  every day, DuoNeb q8hr, and Tessalon 100mg  tid, observe.  CBC/diff, CMP/eGFR

## 2024-01-28 NOTE — Progress Notes (Signed)
 Location:   [facility] Room Number: 2B Place of Service:  SNF (31) Provider: Abner Hoffman Alessio Bogan NP  Tye Gall, MD  Patient Care Team: Tye Gall, MD as PCP - General (Internal Medicine) Dewaun Kinzler X, NP as Nurse Practitioner (Internal Medicine)  Extended Emergency Contact Information Primary Emergency Contact: Morre,[name] Address: [address]          [facility] [place], [place] [address] [place] Home Phone: [phone] Mobile Phone: [phone] Relation: Son Secondary Emergency Contact: Koeppen,Brett Address: [address]          [place], [place] [address] [place] Home Phone: [phone] Mobile Phone: [phone] Relation: Son  Code Status: DNR Goals of care: Advanced Directive information    09/18/2023    8:52 AM  Advanced Directives  Does Patient Have a Medical Advance Directive? Yes  Type of Estate agent of [place];Living will;Out of facility DNR (pink MOST or yellow form)  Does patient want to make changes to medical advance directive? No - Patient declined  Copy of Healthcare Power of Attorney in Chart? Yes - validated most recent copy scanned in chart (See row information)  Pre-existing out of facility DNR order (yellow form or pink MOST form) Pink MOST/Yellow Form most recent copy in chart - Physician notified to receive inpatient order     Chief Complaint  Patient presents with   Acute Visit    Persisted productive cough with episodes of turning red, difficulty catching breath.    HPI:  Pt is a 89 y.o. female seen today for an acute visit for persisted productive cough with episodes of turning red, difficulty catching breath.  Acute bronchitis, has been treated Zpk, DuoNeb q8hr x 3days, Tessalon tid x2 days, Prednisone 20mg  every day x3 days, CXR no PNA, some CHF.   Skin rash/Eczema, resolved on oral prednisone, topical Triamcinolone and Ketoconazole.              09/29/22 CXR CHF,  10/01/22 CXR moderate CHF, on Furosemide daily, Bun/creat 11/0.82 11/01/23, 10/06/22 Echo EF 75%             Gait abnormality, w/c for mobility, risk for falling             Frequent fall: 2//2 lack of safety awareness, increased frailty, 11/03/23 fall, 01/03/24 fall, 01/18/24 fall. Needs close supervision and assistance              Dementia, lack of safety awareness,  resides in SNF [facility] for safety, care assistance, failed discontinuation of  Donepezil               Her mood is stabilizing, but still anxious at times, on Escitalopram , prn Lorazepam              Hypothyroidism, on Levothyroxine , TSH 1.41 07/10/23       Past Medical History:  Diagnosis Date   Eczema    Hypothyroid    Hypothyroidism    Hypothyroidism    Vitamin D  deficiency    Past Surgical History:  Procedure Laterality Date   CHOLECYSTECTOMY N/A 06/24/2013   Procedure: LAPAROSCOPIC CHOLECYSTECTOMY WITH INTRAOPERATIVE CHOLANGIOGRAM;  Surgeon: Shela Derby, MD;  Location: [facility] OR;  Service: General;  Laterality: N/A;   TONSILLECTOMY      No Known Allergies  Allergies as of 01/28/2024   No Known Allergies      Medication List        Accurate as of [name] 28, 2024 11:59 PM. If you have  any questions, ask your nurse or doctor.          acetaminophen  325 MG tablet Commonly known as: TYLENOL  Take 650 mg by mouth every 4 (four) hours as needed for fever.   AQUAPHOR OINTMENT BODY EX Apply to Back, Chest, BLE, BUE topically one time a day for Dry Skin;Itching;Rash related to DERMATITIS, UNSPECIFIED (L30.9) Apply Aquaphor Ointment to rashes on back chest BUE &BLE daily   aspirin  EC 81 MG tablet Take 81 mg by mouth daily. Swallow whole.   calcium -vitamin D  500-200 MG-UNIT tablet Commonly known as: OSCAL WITH D Take 1 tablet by mouth 2 (two) times daily.   donepezil  5 MG tablet Commonly known as: ARICEPT  Take 5 mg by mouth at bedtime.   escitalopram  20 MG tablet Commonly known as: LEXAPRO  Take 20 mg by mouth  daily.   furosemide 20 MG tablet Commonly known as: LASIX Take 20 mg by mouth daily.   lactose free nutrition Liqd Take by mouth daily. nourishment give 1 bottle with 1 cup vanilla ice cream at 10 am   levothyroxine  88 MCG tablet Commonly known as: SYNTHROID  Take 88 mcg by mouth daily. Mon, Tue, Wed, Thu, Fr   levothyroxine  100 MCG tablet Commonly known as: SYNTHROID  Take 100 mcg by mouth. one time a day every Sat, Sun   Lidocaine  & Camphor-Men Lotn 5 & 0.5-0.5 % Kit Apply topically. Apply to neck, abdomen,extremities topically two times a day for Itching   loratadine 10 MG tablet Commonly known as: CLARITIN Take 10 mg by mouth daily.   LORazepam  0.5 MG tablet Commonly known as: ATIVAN  Take 1 tablet (0.5 mg total) by mouth every 12 (twelve) hours as needed for anxiety. Agitation.  Give 1 tablet by mouth every 12 hours as needed for agitation for 14 Days   melatonin 1 MG Tabs tablet Take 1 mg by mouth at bedtime.   oseltamivir 75 MG capsule Commonly known as: TAMIFLU Take 75 mg by mouth 2 (two) times daily.   potassium chloride  10 MEQ CR capsule Commonly known as: MICRO-K  Take 10 mEq by mouth daily.   PreviDent 5000 Booster Plus 1.1 % Pste Generic drug: Sodium Fluoride Place 1 application  onto teeth at bedtime. for oral care USE A PEA SIZE AMOUNT DURING NIGHTTIME OAL CARE. SPIT OUT ANY EXCESS. DO NOT RINSE. DO NOT EAT/DRINK  FOR 30 MINS AFTER   Sarna lotion Generic drug: camphor-menthol Apply 1 Application topically as directed. Apply to neck, abdomen, BLE/BUE topically two times   triamcinolone cream 0.1 % Commonly known as: KENALOG 1 Application 2 (two) times daily as needed (Apply to chest). X 2 week        Review of Systems  Constitutional:  Positive for fatigue. Negative for appetite change and fever.  HENT:  Positive for hearing loss. Negative for congestion, rhinorrhea, sinus pressure, sore throat, trouble swallowing and voice change.   Eyes:  Negative  for visual disturbance.  Respiratory:  Positive for cough. Negative for chest tightness, shortness of breath and wheezing.   Cardiovascular:  Negative for leg swelling.  Gastrointestinal:  Negative for abdominal pain and constipation.  Genitourinary:  Negative for dysuria, flank pain and urgency.  Musculoskeletal:  Positive for gait problem.  Skin:  Negative for color change.  Neurological:  Negative for speech difficulty, weakness and light-headedness.       Memory lapses.   Psychiatric/Behavioral:  Positive for confusion. Negative for sleep disturbance. The patient is nervous/anxious.     Immunization History  Administered Date(s) Administered   Fluad Quad(high Dose 65+) 07/03/2022, 06/13/2023   Influenza Whole 06/15/2018   Influenza, High Dose Seasonal PF 06/13/2023   Influenza,inj,Quad PF,6+ Mos 06/25/2013   Influenza-Unspecified 06/23/2020, 06/30/2021   Moderna Covid-19 Vaccine Bivalent Booster 28yrs & up 06/27/2023   Moderna SARS-COV2 Booster Vaccination 06/27/2023   Moderna Sars-Covid-2 Vaccination 09/13/2019, 10/11/2019, 07/20/2020, 02/08/2021   PPD Test 08/16/2023   Pfizer Covid-19 Vaccine Bivalent Booster 11yrs & up 05/31/2021, 04/19/2022, 07/13/2022   Pneumococcal Conjugate-13 03/11/2014   Pneumococcal Polysaccharide-23 09/11/1986, 09/11/2001, 06/25/2013   RSV,unspecified 10/29/2022   Tdap 03/16/2011, 12/22/2021   Unspecified SARS-COV-2 Vaccination 05/31/2021   Zoster Recombinant(Shingrix) 09/12/2007, 09/02/2022   Zoster, Unspecified 11/21/2022   Pertinent  Health Maintenance Due  Topic Date Due   INFLUENZA VACCINE  04/11/2024   DEXA SCAN  Completed      01/28/2022    3:26 PM 02/15/2022   11:35 AM 04/07/2022   10:29 AM 08/10/2022    9:16 AM 09/19/2022   11:39 AM  Fall Risk  Falls in the past year?  1 1  0  Was there an injury with Fall?  1 1  0  Fall Risk Category Calculator  3 3  0  Fall Risk Category (Retired)  [employer]  Low  (RETIRED) Patient Fall Risk Level  High fall risk High fall risk High fall risk High fall risk Low fall risk  Patient at Risk for Falls Due to  History of fall(s) History of fall(s)  No Fall Risks  Fall risk Follow up  Falls evaluation completed Falls evaluation completed;Education provided  Falls evaluation completed   Functional Status Survey:    Vitals:   01/28/24 1529  BP: (!) 100/52  Pulse: 60  Resp: 18  Temp: 97.8 F (36.6 C)  SpO2: 93%  Weight: 153 lb 3.2 oz (69.5 kg)   Body mass index is 29.92 kg/m. Physical Exam Vitals and nursing note reviewed.  Constitutional:      Appearance: Normal appearance.  HENT:     Head: Normocephalic and atraumatic.     Nose: No congestion.     Mouth/Throat:     Mouth: Mucous membranes are moist.  Eyes:     Extraocular Movements: Extraocular movements intact.     Conjunctiva/sclera: Conjunctivae normal.     Pupils: Pupils are equal, round, and reactive to light.  Cardiovascular:     Rate and Rhythm: Normal rate and regular rhythm.     Heart sounds: No murmur heard. Pulmonary:     Effort: Pulmonary effort is normal.     Breath sounds: Rhonchi present. No wheezing or rales.     Comments: Central congestion, diffused rhonchi Abdominal:     General: Bowel sounds are normal.     Palpations: Abdomen is soft.     Tenderness: There is no abdominal tenderness.     Hernia: A hernia is present.     Comments: Umbilical hernia.   Musculoskeletal:     Cervical back: Normal range of motion and neck supple.     Right lower leg: No edema.     Left lower leg: No edema.  Skin:    General: Skin is warm and dry.  Neurological:     General: No focal deficit present.     Mental Status: She is alert. Mental status is at baseline.     Gait: Gait abnormal.     Comments: Oriented to person, place.   Psychiatric:        Mood and  Affect: Mood normal.        Behavior: Behavior normal.     Labs reviewed: Recent Labs    09/11/23 0000 09/28/23 0000 11/01/23 0000  NA 142 140 141   K 4.1 4.2 4.4  CL 106 105 106  CO2 26* 25* 27*  BUN 25* 17 11  CREATININE 1.0 1.0 0.8  CALCIUM  8.4* 9.2 9.0   Recent Labs    06/12/23 0000 09/28/23 0000  AST 18 22  ALT 19 14  ALKPHOS 70 109  ALBUMIN 3.6 3.2*   Recent Labs    07/10/23 0000 09/11/23 0000 09/28/23 0000 10/02/23 0000  WBC 7.9 6.7 25.9 8.9  NEUTROABS 4,045.00  --  21,471.00 4,788.00  HGB 15.4 13.4 14.8  --   HCT 46 40 43 45  PLT 203 136* 209 228   Lab Results  Component Value Date   TSH 1.41 07/10/2023   Lab Results  Component Value Date   HGBA1C 5.7 06/18/2018   Lab Results  Component Value Date   CHOL 188 01/30/2018   HDL 64 01/30/2018   LDLCALC 106 (H) 01/30/2018   TRIG 89 01/30/2018   CHOLHDL 2.9 01/30/2018    Significant Diagnostic Results in last 30 days:  No results found.  Assessment/Plan: Acute bronchitis has been treated Zpk, DuoNeb q8hr x 3days, Tessalon tid x2 days, Prednisone 20mg  every day x3 days, CXR no PNA, some CHF.  persisted productive cough with episodes of turning red, difficulty catching breath. Continue 3 day course of Prednisone 20mg  every day, DuoNeb q8hr, and Tessalon 100mg  tid, observe.  CBC/diff, CMP/eGFR   CHF (congestive heart failure) (HCC) 09/29/22 CXR CHF, 10/01/22 CXR moderate CHF, on Furosemide daily, Bun/creat 11/0.82 11/01/23, 10/06/22 Echo EF 75%  Dementia without behavioral disturbance (HCC)  lack of safety awareness,  resides in SNF [facility] for safety, care assistance, failed discontinuation of  Donepezil    Depression, recurrent (HCC) Her mood is stabilizing, but still anxious at times, on Escitalopram , prn Lorazepam   Hypothyroidism on Levothyroxine , TSH 1.41 07/10/23    Family/ staff Communication: plan of care reviewed with the patient and charge nurse.   Labs/tests ordered:  CBC/diff, CMP/eGFR in am

## 2024-01-28 NOTE — Assessment & Plan Note (Signed)
 09/29/22 CXR CHF, 10/01/22 CXR moderate CHF, on Furosemide daily, Bun/creat 11/0.82 11/01/23, 10/06/22 Echo EF 75%

## 2024-01-28 NOTE — Assessment & Plan Note (Signed)
 lack of safety awareness,  resides in SNF [facility] for safety, care assistance, failed discontinuation of  Donepezil

## 2024-01-28 NOTE — Assessment & Plan Note (Signed)
Her mood is stabilizing, but still anxious at times, on Escitalopram, prn Lorazepam 

## 2024-02-11 ENCOUNTER — Encounter: Payer: Self-pay | Admitting: Sports Medicine

## 2024-02-11 ENCOUNTER — Non-Acute Institutional Stay (SKILLED_NURSING_FACILITY): Payer: Self-pay | Admitting: Sports Medicine

## 2024-02-11 DIAGNOSIS — E876 Hypokalemia: Secondary | ICD-10-CM

## 2024-02-11 DIAGNOSIS — F411 Generalized anxiety disorder: Secondary | ICD-10-CM | POA: Diagnosis not present

## 2024-02-11 DIAGNOSIS — F02818 Dementia in other diseases classified elsewhere, unspecified severity, with other behavioral disturbance: Secondary | ICD-10-CM

## 2024-02-11 DIAGNOSIS — I509 Heart failure, unspecified: Secondary | ICD-10-CM | POA: Diagnosis not present

## 2024-02-11 DIAGNOSIS — G309 Alzheimer's disease, unspecified: Secondary | ICD-10-CM

## 2024-02-11 DIAGNOSIS — B354 Tinea corporis: Secondary | ICD-10-CM | POA: Diagnosis not present

## 2024-02-11 NOTE — Progress Notes (Signed)
 Provider:  Dr. Tye Gall Location:  [facility] Place of Service:   Skilled care   PCP: Tye Gall, MD Patient Care Team: Tye Gall, MD as PCP - General (Internal Medicine) Mast, Man X, NP as Nurse Practitioner (Internal Medicine)  Extended Emergency Contact Information Primary Emergency Contact: Miner,[name] Address: [address]          [facility] [place], [place] [address] [place] Home Phone: [phone] Mobile Phone: [phone] Relation: Son Secondary Emergency Contact: Lwin,Brett Address: [address]          [place], [place] [address] [place] Home Phone: [phone] Mobile Phone: [phone] Relation: Son  Goals of Care: Advanced Directive information    09/18/2023    8:52 AM  Advanced Directives  Does Patient Have a Medical Advance Directive? Yes  Type of Estate agent of [place];Living will;Out of facility DNR (pink MOST or yellow form)  Does patient want to make changes to medical advance directive? No - Patient declined  Copy of Healthcare Power of Attorney in Chart? Yes - validated most recent copy scanned in chart (See row information)  Pre-existing out of facility DNR order (yellow form or pink MOST form) Pink MOST/Yellow Form most recent copy in chart - Physician notified to receive inpatient order      No chief complaint on file.     History of Present Illness  89 year old female with a history of major neurocognitive disorder, eczema, frequent falls, hypothyroidism is evaluated for chronic disease management. Patient seen and examined in the living room.  She is laying on the couch.  She opens eyes on calling her name.  Seems pleasant and comfortable and cheerful during the interview today. Patient knows her name, not oriented to time or place.  She cannot remember what she had for lunch today. As per nursing staff patient needs limited assistance with her  ADLs.  She has behavioral problems and gets agitated mostly late evenings. Staff reports that she has a rash on her body which is getting worse.  She is currently on Claritin, recently started on terbinafine by dermatology. Patient is able to feed herself.  02/10/2024 13:50 152.2 Lbs (Wheelchair) 02/10/2024 12:53 152.2 Lbs (Wheelchair) 01/14/2024 08:47 153.2 Lbs (Wheelchair) 01/11/2024 07:29 153.2 Lbs (Wheelchair) 01/10/2024 13:41 153.2 Lbs (Wheelchair) 01/10/2024 12:34 153.2 Lbs (Wheelchair) 12/14/2023 07:54 156.6 Lbs (Wheelchair   COMPREHENSIVE METABOLIC PANEL     GLUCOSE 88 mg/dL 63-87  Final                 Fasting reference interval       UREA NITROGEN (BUN) 16 mg/dL 5-64  Final        CREATININE 0.91 mg/dL 3.32-9.51  Final        EGFR 58 mL/min/1.41m2 > OR = 60 L Final        BUN/CREATININE RATIO SEE NOTE: (calc) 6-22  Final       Not Reported: BUN and Creatinine are within    reference range.          SODIUM 140 mmol/L 135-146  Final        POTASSIUM 4.3 mmol/L 3.5-5.3  Final        CHLORIDE 105 mmol/L 98-110  Final        CARBON DIOXIDE 29 mmol/L 20-32  Final        CALCIUM  9.3 mg/dL 8.8-41.6  Final        PROTEIN, TOTAL 6.2 g/dL 6.0-6.3  Final  ALBUMIN 3.4 g/dL 1.6-1.0 L Final        GLOBULIN 2.8 g/dL (calc) 9.6-0.4  Final        ALBUMIN/GLOBULIN RATIO 1.2 (calc) 1.0-2.5  Final        BILIRUBIN, TOTAL 0.7 mg/dL 5.4-0.9  Final        ALKALINE PHOSPHATASE 74 U/L 37-153  Final        AST 17 U/L 10-35  Final        ALT 22 U/L 6-29  Final    WHITE BLOOD CELL COUNT 9.6 Thousand/uL 3.8-10.8  Final        RED BLOOD CELL COUNT 4.70 Million/uL 3.80-5.10  Final        HEMOGLOBIN 15.1 g/dL 81.1-91.4  Final        HEMATOCRIT 46.1 % 35.0-45.0 H Final  Past Medical History:  Diagnosis Date   Eczema    Hypothyroid    Hypothyroidism    Hypothyroidism    Vitamin D  deficiency    Past Surgical History:   Procedure Laterality Date   CHOLECYSTECTOMY N/A 06/24/2013   Procedure: LAPAROSCOPIC CHOLECYSTECTOMY WITH INTRAOPERATIVE CHOLANGIOGRAM;  Surgeon: Shela Derby, MD;  Location: [facility] OR;  Service: General;  Laterality: N/A;   TONSILLECTOMY      reports that she has never smoked. She has never used smokeless tobacco. She reports that she does not drink alcohol and does not use drugs. Social History   Socioeconomic History   Marital status: Widowed    Spouse name: Not on file   Number of children: 3   Years of education: College   Highest education level: Not on file  Occupational History   Occupation: Retired  Tobacco Use   Smoking status: Never   Smokeless tobacco: Never  Vaping Use   Vaping status: Never Used  Substance and Sexual Activity   Alcohol use: No    Alcohol/week: 0.0 standard drinks of alcohol   Drug use: No   Sexual activity: Not on file  Other Topics Concern   Not on file  Social History Narrative   Tobacco use, amount per day now: None      Past tobacco use, amount per day: None      How many years did you use tobacco: Never      Alcohol use (drinks per week): None      Diet: 2 meals a day at [facility]      Do you drink/eat things with caffeine? Coffee      Marital status: Widowed             What year were you married? 1952      Do you live in a house, apartment, assisted living, condo, trailer? Apartment      Is it one or more stories? 1      How many persons live in your home?      Do you have any pets in your home? 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 Yes           How often? Weekly and daily classes. Does walking on her own.      Do you have a living will? Yes      Do you have a DNR form?   Yes         If not, do you want to discuss one?      Do you have signed  POA/HPOA forms? Yes              Social Drivers of Corporate investment banker Strain: Low Risk  (10/02/2017)    Overall Financial Resource Strain (CARDIA)    Difficulty of Paying Living Expenses: Not hard at all  Food Insecurity: No Food Insecurity (10/02/2017)   Hunger Vital Sign    Worried About Running Out of Food in the Last Year: Never true    Ran Out of Food in the Last Year: Never true  Transportation Needs: No Transportation Needs (10/02/2017)   PRAPARE - Administrator, Civil Service (Medical): No    Lack of Transportation (Non-Medical): No  Physical Activity: Sufficiently Active (10/02/2017)   Exercise Vital Sign    Days of Exercise per Week: 7 days    Minutes of Exercise per Session: 30 min  Stress: No Stress Concern Present (10/02/2017)   [employer] of Occupational Health - Occupational Stress Questionnaire    Feeling of Stress : Not at all  Social Connections: Moderately Isolated (10/02/2017)   Social Connection and Isolation Panel [NHANES]    Frequency of Communication with Friends and Family: More than three times a week    Frequency of Social Gatherings with Friends and Family: More than three times a week    Attends Religious Services: Never    Database administrator or Organizations: No    Attends Banker Meetings: Never    Marital Status: Widowed  Intimate Partner Violence: Not At Risk (10/02/2017)   Humiliation, Afraid, Rape, and Kick questionnaire    Fear of Current or Ex-Partner: No    Emotionally Abused: No    Physically Abused: No    Sexually Abused: No    Functional Status Survey:    Family History  Problem Relation Age of Onset   Multiple myeloma Mother    Prostate cancer Father    Heart disease Father     Health Maintenance  Topic Date Due   COVID-19 Vaccine (10 - 2024-25 season) 08/22/2023   INFLUENZA VACCINE  04/11/2024   Medicare Annual Wellness (AWV)  07/08/2024   DTaP/Tdap/Td (3 - Td or Tdap) 12/23/2031   Pneumonia Vaccine 18+ Years old  Completed   DEXA SCAN  Completed   Zoster Vaccines- Shingrix  Completed   HPV  VACCINES  Aged Out   Meningococcal B Vaccine  Aged Out    No Known Allergies  Outpatient Encounter Medications as of 02/11/2024  Medication Sig   acetaminophen  (TYLENOL ) 325 MG tablet Take 650 mg by mouth every 4 (four) hours as needed for fever.   aspirin  EC 81 MG tablet Take 81 mg by mouth daily. Swallow whole.   calcium -vitamin D  (OSCAL WITH D) 500-200 MG-UNIT tablet Take 1 tablet by mouth 2 (two) times daily.   camphor-menthol (SARNA) lotion Apply 1 Application topically as directed. Apply to neck, abdomen, BLE/BUE topically two times   donepezil  (ARICEPT ) 5 MG tablet Take 5 mg by mouth at bedtime.   Emollient (AQUAPHOR OINTMENT BODY EX) Apply to Back, Chest, BLE, BUE topically one time a day for Dry Skin;Itching;Rash related to DERMATITIS, UNSPECIFIED (L30.9) Apply Aquaphor Ointment to rashes on back chest BUE &BLE daily   escitalopram  (LEXAPRO ) 20 MG tablet Take 20 mg by mouth daily.   furosemide (LASIX) 20 MG tablet Take 20 mg by mouth daily. (Patient not taking: Reported on 12/12/2023)   lactose free nutrition (BOOST) LIQD Take by mouth daily. nourishment give 1 bottle with  1 cup vanilla ice cream at 10 am   levothyroxine  (SYNTHROID ) 100 MCG tablet Take 100 mcg by mouth. one time a day every Sat, Sun   levothyroxine  (SYNTHROID ) 88 MCG tablet Take 88 mcg by mouth daily. Mon, Tue, Wed, Thu, Fr   Lidocaine  & Camphor-Men Lotn 5 & 0.5-0.5 % KIT Apply topically. Apply to neck, abdomen,extremities topically two times a day for Itching (Patient not taking: Reported on 12/12/2023)   loratadine (CLARITIN) 10 MG tablet Take 10 mg by mouth daily.   LORazepam  (ATIVAN ) 0.5 MG tablet Take 1 tablet (0.5 mg total) by mouth every 12 (twelve) hours as needed for anxiety. Agitation.  Give 1 tablet by mouth every 12 hours as needed for agitation for 14 Days   melatonin 1 MG TABS tablet Take 1 mg by mouth at bedtime.   oseltamivir (TAMIFLU) 75 MG capsule Take 75 mg by mouth 2 (two) times daily. (Patient not  taking: Reported on 09/18/2023)   potassium chloride  (MICRO-K ) 10 MEQ CR capsule Take 10 mEq by mouth daily.   Sodium Fluoride (PREVIDENT 5000 BOOSTER PLUS) 1.1 % PSTE Place 1 application  onto teeth at bedtime. for oral care USE A PEA SIZE AMOUNT DURING NIGHTTIME OAL CARE. SPIT OUT ANY EXCESS. DO NOT RINSE. DO NOT EAT/DRINK  FOR 30 MINS AFTER   triamcinolone cream (KENALOG) 0.1 % 1 Application 2 (two) times daily as needed (Apply to chest). X 2 week   No facility-administered encounter medications on file as of 02/11/2024.    Review of Systems  Unable to perform ROS: Dementia  Constitutional:  Negative for fever.  Respiratory:  Negative for cough, shortness of breath and wheezing.   Cardiovascular:  Negative for chest pain and leg swelling.  Gastrointestinal:  Negative for abdominal distention, abdominal pain, blood in stool, constipation and diarrhea.  Genitourinary:  Negative for dysuria and frequency.  Neurological:  Negative for dizziness.   Negative unless indicated in HPI.  There were no vitals filed for this visit. There is no height or weight on file to calculate BMI. BP Readings from Last 3 Encounters:  01/28/24 (!) 100/52  01/22/24 108/60  01/08/24 (!) 129/56   Wt Readings from Last 3 Encounters:  01/28/24 153 lb 3.2 oz (69.5 kg)  01/22/24 153 lb 3.2 oz (69.5 kg)  01/08/24 156 lb 9.6 oz (71 kg)   Physical Exam Constitutional:      Appearance: Normal appearance.  HENT:     Head: Normocephalic and atraumatic.  Cardiovascular:     Rate and Rhythm: Normal rate and regular rhythm.  Pulmonary:     Effort: Pulmonary effort is normal. No respiratory distress.     Breath sounds: Normal breath sounds. No wheezing.  Abdominal:     General: Bowel sounds are normal. There is no distension.     Tenderness: There is no abdominal tenderness. There is no guarding.     Comments:    Musculoskeletal:        General: No swelling.  Skin:    Comments: Erythematous flat lesions on her  neck    Neurological:     Mental Status: She is alert. Mental status is at baseline.     Motor: No weakness.     Labs reviewed: Basic Metabolic Panel: Recent Labs    09/11/23 0000 09/28/23 0000 11/01/23 0000  NA 142 140 141  K 4.1 4.2 4.4  CL 106 105 106  CO2 26* 25* 27*  BUN 25* 17 11  CREATININE 1.0 1.0  0.8  CALCIUM  8.4* 9.2 9.0   Liver Function Tests: Recent Labs    06/12/23 0000 09/28/23 0000  AST 18 22  ALT 19 14  ALKPHOS 70 109  ALBUMIN 3.6 3.2*   No results for input(s): "LIPASE", "AMYLASE" in the last 8760 hours. No results for input(s): "AMMONIA" in the last 8760 hours. CBC: Recent Labs    07/10/23 0000 09/11/23 0000 09/28/23 0000 10/02/23 0000  WBC 7.9 6.7 25.9 8.9  NEUTROABS 4,045.00  --  21,471.00 4,788.00  HGB 15.4 13.4 14.8  --   HCT 46 40 43 45  PLT 203 136* 209 228   Cardiac Enzymes: No results for input(s): "CKTOTAL", "CKMB", "CKMBINDEX", "TROPONINI" in the last 8760 hours. BNP: Invalid input(s): "POCBNP" Lab Results  Component Value Date   HGBA1C 5.7 06/18/2018   Lab Results  Component Value Date   TSH 1.41 07/10/2023   Lab Results  Component Value Date   VITAMINB12 352 10/28/2014   No results found for: "FOLATE" No results found for: "IRON", "TIBC", "FERRITIN"  Imaging and Procedures obtained prior to SNF admission: No results found.  Assessment and Plan Assessment & Plan   Major neurocognitive disorder with behavioral disturbances  Continue with supportive care Continue monitor for delirium Functional Assessment Staging Scale: 7b - Speech ability is limited to using a single intelligible word on an average day or in an intensive interview (the person may repeat the word over and over).  Staff reported increased agitation in evening Continue with the Lexapro , Ativan  as needed  Tinea corporis Patient was evaluated by dermatology.  And was started on oral terbinafine Reviewed recent labs Will order CMP in 4  weeks  Hypothyroidism Check TSH Continue with levothyroxine    Hypokalemia Potassium at goal Continue with potassium supplements  CHF Lower extremity swelling improved Continue with Lasix   30 minTotal time spent for obtaining history,  performing a medically appropriate examination and evaluation, reviewing the tests,ordering  tests,  documenting clinical information in the electronic or other health record,  ,care coordination (not separately reported)

## 2024-02-18 ENCOUNTER — Other Ambulatory Visit: Payer: Medicare (Managed Care)

## 2024-03-07 ENCOUNTER — Non-Acute Institutional Stay (SKILLED_NURSING_FACILITY): Admitting: Nurse Practitioner

## 2024-03-07 ENCOUNTER — Encounter: Payer: Self-pay | Admitting: Nurse Practitioner

## 2024-03-07 DIAGNOSIS — R269 Unspecified abnormalities of gait and mobility: Secondary | ICD-10-CM

## 2024-03-07 DIAGNOSIS — I509 Heart failure, unspecified: Secondary | ICD-10-CM | POA: Diagnosis not present

## 2024-03-07 DIAGNOSIS — F0390 Unspecified dementia without behavioral disturbance: Secondary | ICD-10-CM | POA: Diagnosis not present

## 2024-03-07 DIAGNOSIS — E039 Hypothyroidism, unspecified: Secondary | ICD-10-CM

## 2024-03-07 DIAGNOSIS — F339 Major depressive disorder, recurrent, unspecified: Secondary | ICD-10-CM

## 2024-03-07 NOTE — Assessment & Plan Note (Signed)
Her mood is stabilizing, but still anxious at times, on Escitalopram, prn Lorazepam 

## 2024-03-07 NOTE — Assessment & Plan Note (Signed)
,   on Furosemide daily, Bun/creat 11/0.82 11/01/23, 10/06/22 Echo EF 75%

## 2024-03-07 NOTE — Assessment & Plan Note (Signed)
 on Levothyroxine, TSH 1.41 07/10/23

## 2024-03-07 NOTE — Progress Notes (Signed)
 Location:   [facility] Room Number: 2B Place of Service:  SNF (31) Provider: Larwance Adrienne Delay NP  Sherlynn Madden, MD  Patient Care Team: Sherlynn Madden, MD as PCP - General (Internal Medicine) Avagail Whittlesey X, NP as Nurse Practitioner (Internal Medicine)  Extended Emergency Contact Information Primary Emergency Contact: Guiney,[name] Address: [address]          [facility] [place], [place] [address] [place] Home Phone: [phone] Mobile Phone: [phone] Relation: Son Secondary Emergency Contact: Messerschmidt,Brett Address: [address]          [place], [place] [address] [place] Home Phone: [phone] Mobile Phone: [phone] Relation: Son  Code Status:  DNR Goals of care: Advanced Directive information    09/18/2023    8:52 AM  Advanced Directives  Does Patient Have a Medical Advance Directive? Yes  Type of Estate agent of [place];Living will;Out of facility DNR (pink MOST or yellow form)  Does patient want to make changes to medical advance directive? No - Patient declined  Copy of Healthcare Power of Attorney in Chart? Yes - validated most recent copy scanned in chart (See row information)  Pre-existing out of facility DNR order (yellow form or pink MOST form) Pink MOST/Yellow Form most recent copy in chart - Physician notified to receive inpatient order     Chief Complaint  Patient presents with   Medical Management of Chronic Issues    HPI:  Pt is a 89 y.o. female seen today for medical management of chronic diseases.       Hx of skin rash/Eczema, resolved on oral prednisone, topical Triamcinolone and Ketoconazole. On Terbinafine x 30 days since 02/19/24 per dermatology.              09/29/22 CXR CHF, 10/01/22 CXR moderate CHF, on Furosemide daily, Bun/creat 11/0.82 11/01/23, 10/06/22 Echo EF 75%             Gait abnormality, w/c for mobility, risk for falling             Frequent fall: 2//2 lack of  safety awareness, increased frailty, 11/03/23 fall, 01/03/24 fall, 01/18/24 fall. Needs close supervision and assistance              Dementia, lack of safety awareness,  resides in SN[facility] for safety, care assistance, failed discontinuation of  Donepezil               Her mood is stabilizing, but still anxious at times, on Escitalopram , prn Lorazepam              Hypothyroidism, on Levothyroxine , TSH 1.41 07/10/23                 Past Medical History:  Diagnosis Date   Eczema    Hypothyroid    Hypothyroidism    Hypothyroidism    Vitamin D  deficiency    Past Surgical History:  Procedure Laterality Date   CHOLECYSTECTOMY N/A 06/24/2013   Procedure: LAPAROSCOPIC CHOLECYSTECTOMY WITH INTRAOPERATIVE CHOLANGIOGRAM;  Surgeon: Lynda Leos, MD;  Location: [facility];  Service: General;  Laterality: N/A;   TONSILLECTOMY      No Known Allergies  Allergies as of 03/07/2024   No Known Allergies      Medication List        Accurate as of March 07, 2024  2:34 PM. If you have any questions, ask your nurse or doctor.          acetaminophen  325 MG tablet  Commonly known as: TYLENOL  Take 650 mg by mouth every 4 (four) hours as needed for fever.   AQUAPHOR OINTMENT BODY EX Apply to Back, Chest, BLE, BUE topically one time a day for Dry Skin;Itching;Rash related to DERMATITIS, UNSPECIFIED (L30.9) Apply Aquaphor Ointment to rashes on back chest BUE &BLE daily   aspirin  EC 81 MG tablet Take 81 mg by mouth daily. Swallow whole.   calcium -vitamin D  500-200 MG-UNIT tablet Commonly known as: OSCAL WITH D Take 1 tablet by mouth 2 (two) times daily.   donepezil  5 MG tablet Commonly known as: ARICEPT  Take 5 mg by mouth at bedtime.   escitalopram  20 MG tablet Commonly known as: LEXAPRO  Take 20 mg by mouth daily.   furosemide 20 MG tablet Commonly known as: LASIX Take 20 mg by mouth daily.   lactose free nutrition Liqd Take by mouth daily. nourishment give 1 bottle with 1 cup vanilla ice  cream at 10 am   levothyroxine  88 MCG tablet Commonly known as: SYNTHROID  Take 88 mcg by mouth daily. Mon, Tue, Wed, Thu, Fr   levothyroxine  100 MCG tablet Commonly known as: SYNTHROID  Take 100 mcg by mouth. one time a day every Sat, Sun   loratadine 10 MG tablet Commonly known as: CLARITIN Take 10 mg by mouth daily.   LORazepam  0.5 MG tablet Commonly known as: ATIVAN  Take 1 tablet (0.5 mg total) by mouth every 12 (twelve) hours as needed for anxiety. Agitation.  Give 1 tablet by mouth every 12 hours as needed for agitation for 14 Days   melatonin 1 MG Tabs tablet Take 1 mg by mouth at bedtime.   potassium chloride  10 MEQ CR capsule Commonly known as: MICRO-K  Take 10 mEq by mouth daily.   PreviDent 5000 Booster Plus 1.1 % Pste Generic drug: Sodium Fluoride Place 1 application  onto teeth at bedtime. for oral care USE A PEA SIZE AMOUNT DURING NIGHTTIME OAL CARE. SPIT OUT ANY EXCESS. DO NOT RINSE. DO NOT EAT/DRINK  FOR 30 MINS AFTER   Sarna lotion Generic drug: camphor-menthol Apply 1 Application topically as directed. Apply to neck, abdomen, BLE/BUE topically two times   triamcinolone cream 0.1 % Commonly known as: KENALOG 1 Application 2 (two) times daily as needed (Apply to chest). X 2 week        Review of Systems  Constitutional:  Negative for appetite change, fatigue and fever.  HENT:  Positive for hearing loss. Negative for congestion and trouble swallowing.   Eyes:  Negative for visual disturbance.  Respiratory:  Negative for cough, shortness of breath and wheezing.   Cardiovascular:  Negative for leg swelling.  Gastrointestinal:  Negative for abdominal pain and constipation.  Genitourinary:  Negative for dysuria and urgency.  Musculoskeletal:  Positive for gait problem.  Skin:  Negative for color change.  Neurological:  Negative for speech difficulty and weakness.       Memory lapses.   Psychiatric/Behavioral:  Positive for confusion. Negative for sleep  disturbance. The patient is nervous/anxious.     Immunization History  Administered Date(s) Administered   Fluad Quad(high Dose 65+) 07/03/2022, 06/13/2023   Influenza Whole 06/15/2018   Influenza, High Dose Seasonal PF 06/13/2023   Influenza,inj,Quad PF,6+ Mos 06/25/2013   Influenza-Unspecified 06/23/2020, 06/30/2021   Moderna Covid-19 Vaccine Bivalent Booster 72yrs & up 06/27/2023   Moderna SARS-COV2 Booster Vaccination 06/27/2023   Moderna Sars-Covid-2 Vaccination 09/13/2019, 10/11/2019, 07/20/2020, 02/08/2021   PPD Test 08/16/2023   Pfizer Covid-19 Vaccine Bivalent Booster 61yrs & up 05/31/2021, 04/19/2022,  07/13/2022   Pneumococcal Conjugate-13 03/11/2014   Pneumococcal Polysaccharide-23 09/11/1986, 09/11/2001, 06/25/2013   RSV,unspecified 10/29/2022   Tdap 03/16/2011, 12/22/2021   Unspecified SARS-COV-2 Vaccination 05/31/2021   Zoster Recombinant(Shingrix) 09/12/2007, 09/02/2022   Zoster, Unspecified 11/21/2022   Pertinent  Health Maintenance Due  Topic Date Due   INFLUENZA VACCINE  04/11/2024   DEXA SCAN  Completed      01/28/2022    3:26 PM 02/15/2022   11:35 AM 04/07/2022   10:29 AM 08/10/2022    9:16 AM 09/19/2022   11:39 AM  Fall Risk  Falls in the past year?  1 1  0  Was there an injury with Fall?  1 1  0  Fall Risk Category Calculator  3 3  0  Fall Risk Category (Retired)  [employer]   Low   (RETIRED) Patient Fall Risk Level High fall risk  High fall risk  High fall risk  High fall risk  Low fall risk   Patient at Risk for Falls Due to  History of fall(s) History of fall(s)  No Fall Risks  Fall risk Follow up  Falls evaluation completed  Falls evaluation completed;Education provided   Falls evaluation completed      Data saved with a previous flowsheet row definition   Functional Status Survey:    Vitals:   03/07/24 1144  BP: (!) 119/50  Pulse: (!) 59  Resp: 20  Temp: (!) 97.5 F (36.4 C)  SpO2: 96%  Weight: 153 lb 12.8 oz (69.8 kg)   Body mass index  is 30.04 kg/m. Physical Exam Vitals and nursing note reviewed.  Constitutional:      Appearance: Normal appearance.  HENT:     Head: Normocephalic and atraumatic.     Nose: Nose normal.     Mouth/Throat:     Mouth: Mucous membranes are moist.   Eyes:     Extraocular Movements: Extraocular movements intact.     Conjunctiva/sclera: Conjunctivae normal.     Pupils: Pupils are equal, round, and reactive to light.    Cardiovascular:     Rate and Rhythm: Normal rate and regular rhythm.     Heart sounds: No murmur heard. Pulmonary:     Effort: Pulmonary effort is normal.     Breath sounds: No wheezing or rales.  Abdominal:     General: Bowel sounds are normal.     Palpations: Abdomen is soft.     Tenderness: There is no abdominal tenderness.     Hernia: A hernia is present.     Comments: Umbilical hernia.    Musculoskeletal:     Cervical back: Normal range of motion and neck supple.     Right lower leg: No edema.     Left lower leg: No edema.   Skin:    General: Skin is warm and dry.   Neurological:     General: No focal deficit present.     Mental Status: She is alert. Mental status is at baseline.     Gait: Gait abnormal.     Comments: Oriented to person, place.   Psychiatric:        Mood and Affect: Mood normal.        Behavior: Behavior normal.     Labs reviewed: Recent Labs    09/11/23 0000 09/28/23 0000 11/01/23 0000  NA 142 140 141  K 4.1 4.2 4.4  CL 106 105 106  CO2 26* 25* 27*  BUN 25* 17 11  CREATININE 1.0  1.0 0.8  CALCIUM  8.4* 9.2 9.0   Recent Labs    06/12/23 0000 09/28/23 0000  AST 18 22  ALT 19 14  ALKPHOS 70 109  ALBUMIN 3.6 3.2*   Recent Labs    07/10/23 0000 09/11/23 0000 09/28/23 0000 10/02/23 0000  WBC 7.9 6.7 25.9 8.9  NEUTROABS 4,045.00  --  21,471.00 4,788.00  HGB 15.4 13.4 14.8  --   HCT 46 40 43 45  PLT 203 136* 209 228   Lab Results  Component Value Date   TSH 1.41 07/10/2023   Lab Results  Component Value  Date   HGBA1C 5.7 06/18/2018   Lab Results  Component Value Date   CHOL 188 01/30/2018   HDL 64 01/30/2018   LDLCALC 106 (H) 01/30/2018   TRIG 89 01/30/2018   CHOLHDL 2.9 01/30/2018    Significant Diagnostic Results in last 30 days:  No results found.  Assessment/Plan  CHF (congestive heart failure) (HCC) , on Furosemide daily, Bun/creat 11/0.82 11/01/23, 10/06/22 Echo EF 75%  Gait abnormality w/c for mobility, risk for falling  Dementia without behavioral disturbance (HCC) lack of safety awareness,  resides in SNF [facility] for safety, care assistance, failed discontinuation of  Donepezil    Depression, recurrent (HCC) Her mood is stabilizing, but still anxious at times, on Escitalopram , prn Lorazepam    Family/ staff Communication: Plan of care reviewed with the patient and charge nurse  Labs/tests ordered:  none

## 2024-03-07 NOTE — Assessment & Plan Note (Signed)
 lack of safety awareness,  resides in SNF [facility] for safety, care assistance, failed discontinuation of  Donepezil

## 2024-03-07 NOTE — Assessment & Plan Note (Signed)
 w/c for mobility, risk for falling

## 2024-03-11 LAB — BASIC METABOLIC PANEL WITH GFR
BUN: 20 (ref 4–21)
CO2: 29 — AB (ref 13–22)
Chloride: 105 (ref 99–108)
Creatinine: 1 (ref 0.5–1.1)
Glucose: 86
Potassium: 4.2 meq/L (ref 3.5–5.1)
Sodium: 140 (ref 137–147)

## 2024-03-11 LAB — COMPREHENSIVE METABOLIC PANEL WITH GFR
Albumin: 3.4 — AB (ref 3.5–5.0)
Calcium: 9 (ref 8.7–10.7)
Globulin: 2.7
eGFR: 55

## 2024-03-11 LAB — CBC: RBC: 4.58 (ref 3.87–5.11)

## 2024-03-11 LAB — TSH: TSH: 7.38 — AB (ref 0.41–5.90)

## 2024-03-11 LAB — HEPATIC FUNCTION PANEL
ALT: 23 U/L (ref 7–35)
AST: 18 (ref 13–35)
Alkaline Phosphatase: 65 (ref 25–125)
Bilirubin, Total: 0.6

## 2024-03-11 LAB — CBC AND DIFFERENTIAL
HCT: 46 (ref 36–46)
Hemoglobin: 14.8 (ref 12.0–16.0)
Platelets: 172 K/uL (ref 150–400)
WBC: 7.7

## 2024-04-01 LAB — COMPREHENSIVE METABOLIC PANEL WITH GFR
Albumin: 3.3 — AB (ref 3.5–5.0)
Globulin: 2.4

## 2024-04-01 LAB — HEPATIC FUNCTION PANEL
ALT: 15 U/L (ref 7–35)
AST: 18 (ref 13–35)
Alkaline Phosphatase: 61 (ref 25–125)
Bilirubin, Direct: 0.1 (ref 0.01–0.4)
Bilirubin, Total: 0.7

## 2024-04-10 ENCOUNTER — Non-Acute Institutional Stay (SKILLED_NURSING_FACILITY): Payer: Self-pay | Admitting: Nurse Practitioner

## 2024-04-10 DIAGNOSIS — F339 Major depressive disorder, recurrent, unspecified: Secondary | ICD-10-CM

## 2024-04-10 DIAGNOSIS — R269 Unspecified abnormalities of gait and mobility: Secondary | ICD-10-CM

## 2024-04-10 DIAGNOSIS — F0390 Unspecified dementia without behavioral disturbance: Secondary | ICD-10-CM

## 2024-04-10 DIAGNOSIS — I509 Heart failure, unspecified: Secondary | ICD-10-CM

## 2024-04-10 DIAGNOSIS — E039 Hypothyroidism, unspecified: Secondary | ICD-10-CM

## 2024-04-10 NOTE — Progress Notes (Unsigned)
 Location[facility]  [facility] Room Number: [id number] Place of Service:  SNF (31) Provider:  Jasha Hodzic, ManX NP  Sherlynn Madden, MD  Patient Care Team: Sherlynn Madden, MD as PCP - General (Internal Medicine) Shondrika Hoque X, NP as Nurse Practitioner (Internal Medicine)  Extended Emergency Contact Information Primary Emergency Contact: Bartok,[name] Address: [address]          [facility] [place], [place] [address] [place] Home Phone: [phone] Mobile Phone: [phone] Relation: Son Secondary Emergency Contact: Ogren,Brett Address: [address]          [place], [place] [address] [place] Home Phone: [phone] Mobile Phone: [phone] Relation: Son  Code Status:  DNR Goals of care: Advanced Directive information    04/10/2024   11:04 AM  Advanced Directives  Does Patient Have a Medical Advance Directive? Yes  Type of Advance Directive Living will;Out of facility DNR (pink MOST or yellow form)  Does patient want to make changes to medical advance directive? No - Patient declined  Pre-existing out of facility DNR order (yellow form or pink MOST form) Pink MOST/Yellow Form most recent copy in chart - Physician notified to receive inpatient order     Chief Complaint  Patient presents with   Medical Management of Chronic Issues    HPI:  Pt is a 89 y.o. female seen today for managing chronic medical conditions  Hx of skin rash/Eczema, resolved on oral prednisone, topical Triamcinolone and Ketoconazole. On Terbinafine x 30 days since 02/19/24 per dermatology.              09/29/22 CXR CHF, 10/01/22 CXR moderate CHF, on Furosemide daily, Bun/creat 11/0.82 11/01/23, 10/06/22 Echo EF 75%             Gait abnormality, w/c for mobility, risk for falling             Frequent fall: 2//2 lack of safety awareness, increased frailty, last fall 04/09/24 the patient was found lying on her back, anxious and agitated, no apparent injury,  VS/neurochecks initiated. Needs close supervision and assistance              Dementia, lack of safety awareness,  resides in SNF [facility] for safety, care assistance, failed discontinuation of  Donepezil               Her mood is stabilizing, but still anxious at times, on Escitalopram , prn Lorazepam              Hypothyroidism, on Levothyroxine , TSH 1.41 07/10/23      Past Medical History:  Diagnosis Date   Eczema    Hypothyroid    Hypothyroidism    Hypothyroidism    Vitamin D  deficiency    Past Surgical History:  Procedure Laterality Date   CHOLECYSTECTOMY N/A 06/24/2013   Procedure: LAPAROSCOPIC CHOLECYSTECTOMY WITH INTRAOPERATIVE CHOLANGIOGRAM;  Surgeon: Lynda Leos, MD;  Location: [facility] OR;  Service: General;  Laterality: N/A;   TONSILLECTOMY      No Known Allergies  Allergies as of 04/10/2024   No Known Allergies      Medication List        Accurate as of April 10, 2024 11:59 PM. If you have any questions, ask your nurse or doctor.          STOP taking these medications    triamcinolone cream 0.1 % Commonly known as: KENALOG       TAKE these medications    acetaminophen  325 MG tablet Commonly  known as: TYLENOL  Take 650 mg by mouth every 4 (four) hours as needed for fever.   AQUAPHOR OINTMENT BODY EX Apply to Back, Chest, BLE, BUE topically one time a day for Dry Skin;Itching;Rash related to DERMATITIS, UNSPECIFIED (L30.9) Apply Aquaphor Ointment to rashes on back chest BUE &BLE daily   aspirin  EC 81 MG tablet Take 81 mg by mouth daily. Swallow whole.   calcium -vitamin D  500-200 MG-UNIT tablet Commonly known as: OSCAL WITH D Take 1 tablet by mouth 2 (two) times daily.   donepezil  5 MG tablet Commonly known as: ARICEPT  Take 5 mg by mouth at bedtime.   escitalopram  20 MG tablet Commonly known as: LEXAPRO  Take 20 mg by mouth daily.   furosemide 20 MG tablet Commonly known as: LASIX Take 20 mg by mouth daily.   lactose free nutrition Liqd Take by  mouth daily. nourishment give 1 bottle with 1 cup vanilla ice cream at 10 am   levothyroxine  88 MCG tablet Commonly known as: SYNTHROID  Take 88 mcg by mouth daily. Mon, Tue, Wed, Thu, Fr   levothyroxine  100 MCG tablet Commonly known as: SYNTHROID  Take 100 mcg by mouth. one time a day every Sat, Sun   loratadine 10 MG tablet Commonly known as: CLARITIN Take 10 mg by mouth daily.   LORazepam  0.5 MG tablet Commonly known as: ATIVAN  Take 1 tablet (0.5 mg total) by mouth every 12 (twelve) hours as needed for anxiety. Agitation.  Give 1 tablet by mouth every 12 hours as needed for agitation for 14 Days   melatonin 1 MG Tabs tablet Take 1 mg by mouth at bedtime.   potassium chloride  10 MEQ CR capsule Commonly known as: MICRO-K  Take 10 mEq by mouth daily.   PreviDent 5000 Booster Plus 1.1 % Pste Generic drug: Sodium Fluoride Place 1 application  onto teeth at bedtime. for oral care USE A PEA SIZE AMOUNT DURING NIGHTTIME OAL CARE. SPIT OUT ANY EXCESS. DO NOT RINSE. DO NOT EAT/DRINK  FOR 30 MINS AFTER   Sarna lotion Generic drug: camphor-menthol Apply 1 Application topically as directed. Apply to neck, abdomen, BLE/BUE topically two times   terbinafine 250 MG tablet Commonly known as: LAMISIL Take 250 mg by mouth daily.        Review of Systems  Constitutional:  Negative for appetite change, fatigue and fever.  HENT:  Positive for hearing loss. Negative for congestion and trouble swallowing.   Eyes:  Negative for visual disturbance.  Respiratory:  Negative for cough, shortness of breath and wheezing.   Cardiovascular:  Negative for leg swelling.  Gastrointestinal:  Negative for abdominal pain and constipation.  Genitourinary:  Negative for dysuria and urgency.  Musculoskeletal:  Positive for gait problem.  Skin:  Negative for color change.  Neurological:  Negative for speech difficulty and weakness.       Memory lapses.   Psychiatric/Behavioral:  Positive for confusion.  Negative for sleep disturbance. The patient is nervous/anxious.     Immunization History  Administered Date(s) Administered   Fluad Quad(high Dose 65+) 07/03/2022, 06/13/2023   Influenza Whole 06/15/2018   Influenza, High Dose Seasonal PF 06/13/2023   Influenza,inj,Quad PF,6+ Mos 06/25/2013   Influenza-Unspecified 06/23/2020, 06/30/2021   Moderna Covid-19 Vaccine Bivalent Booster 60yrs & up 06/27/2023   Moderna SARS-COV2 Booster Vaccination 06/27/2023   Moderna Sars-Covid-2 Vaccination 09/13/2019, 10/11/2019, 07/20/2020, 02/08/2021   PPD Test 08/16/2023   Pfizer Covid-19 Vaccine Bivalent Booster 73yrs & up 05/31/2021, 04/19/2022, 07/13/2022   Pneumococcal Conjugate-13 03/11/2014   Pneumococcal  Polysaccharide-23 09/11/1986, 09/11/2001, 06/25/2013   RSV,unspecified 10/29/2022   Tdap 03/16/2011, 12/22/2021   Unspecified SARS-COV-2 Vaccination 05/31/2021   Zoster Recombinant(Shingrix) 09/12/2007, 09/02/2022   Zoster, Unspecified 11/21/2022   Pertinent  Health Maintenance Due  Topic Date Due   INFLUENZA VACCINE  04/11/2024   DEXA SCAN  Completed      01/28/2022    3:26 PM 02/15/2022   11:35 AM 04/07/2022   10:29 AM 08/10/2022    9:16 AM 09/19/2022   11:39 AM  Fall Risk  Falls in the past year?  1 1  0  Was there an injury with Fall?  1 1  0  Fall Risk Category Calculator  3 3  0  Fall Risk Category (Retired)  [employer]   Low   (RETIRED) Patient Fall Risk Level High fall risk  High fall risk  High fall risk  High fall risk  Low fall risk   Patient at Risk for Falls Due to  History of fall(s) History of fall(s)  No Fall Risks  Fall risk Follow up  Falls evaluation completed  Falls evaluation completed;Education provided   Falls evaluation completed      Data saved with a previous flowsheet row definition   Functional Status Survey:    Vitals:   04/10/24 1105 04/11/24 1136  BP: (!) 117/57 128/80  Pulse: (!) 52 76  Resp: 17   Temp: 97.7 F (36.5 C)   SpO2: 96%   Weight: 145  lb 14.4 oz (66.2 kg)   Height: 5' (1.524 m)    Body mass index is 28.49 kg/m. Physical Exam Vitals and nursing note reviewed.  Constitutional:      Appearance: Normal appearance.  HENT:     Head: Normocephalic and atraumatic.     Nose: Nose normal.     Mouth/Throat:     Mouth: Mucous membranes are moist.  Eyes:     Extraocular Movements: Extraocular movements intact.     Conjunctiva/sclera: Conjunctivae normal.     Pupils: Pupils are equal, round, and reactive to light.  Cardiovascular:     Rate and Rhythm: Normal rate and regular rhythm.     Heart sounds: No murmur heard. Pulmonary:     Effort: Pulmonary effort is normal.     Breath sounds: No wheezing or rales.  Abdominal:     General: Bowel sounds are normal.     Palpations: Abdomen is soft.     Tenderness: There is no abdominal tenderness.     Hernia: A hernia is present.     Comments: Umbilical hernia.   Musculoskeletal:     Cervical back: Normal range of motion and neck supple.     Right lower leg: No edema.     Left lower leg: No edema.  Skin:    General: Skin is warm and dry.  Neurological:     General: No focal deficit present.     Mental Status: She is alert. Mental status is at baseline.     Gait: Gait abnormal.     Comments: Oriented to person  Psychiatric:        Mood and Affect: Mood normal.        Behavior: Behavior normal.     Labs reviewed: Recent Labs    09/11/23 0000 09/28/23 0000 11/01/23 0000  NA 142 140 141  K 4.1 4.2 4.4  CL 106 105 106  CO2 26* 25* 27*  BUN 25* 17 11  CREATININE 1.0 1.0 0.8  CALCIUM   8.4* 9.2 9.0   Recent Labs    06/12/23 0000 09/28/23 0000  AST 18 22  ALT 19 14  ALKPHOS 70 109  ALBUMIN 3.6 3.2*   Recent Labs    07/10/23 0000 09/11/23 0000 09/28/23 0000 10/02/23 0000  WBC 7.9 6.7 25.9 8.9  NEUTROABS 4,045.00  --  21,471.00 4,788.00  HGB 15.4 13.4 14.8  --   HCT 46 40 43 45  PLT 203 136* 209 228   Lab Results  Component Value Date   TSH 1.41  07/10/2023   Lab Results  Component Value Date   HGBA1C 5.7 06/18/2018   Lab Results  Component Value Date   CHOL 188 01/30/2018   HDL 64 01/30/2018   LDLCALC 106 (H) 01/30/2018   TRIG 89 01/30/2018   CHOLHDL 2.9 01/30/2018    Significant Diagnostic Results in last 30 days:  No results found.  Assessment/Plan CHF (congestive heart failure) (HCC) 09/29/22 CXR CHF, 10/01/22 CXR moderate CHF, on Furosemide daily, Bun/creat 11/0.82 11/01/23, 10/06/22 Echo EF 75%  Gait abnormality Gait abnormality, w/c for mobility, risk for falling             Frequent fall: 2//2 lack of safety awareness, increased frailty, last fall 04/09/24 the patient was found lying on her back, anxious and agitated, no apparent injury, VS/neurochecks initiated. Needs close supervision and assistance   Dementia without behavioral disturbance (HCC)  lack of safety awareness,  resides in SNF [facility] for safety, care assistance, failed discontinuation of  Donepezil    Depression, recurrent (HCC) Her mood is stabilizing, but still anxious at times, on Escitalopram , prn Lorazepam   Hypothyroidism  on Levothyroxine , TSH 1.41 07/10/23     Family/ staff Communication: Plan of care reviewed with with the patient and charge nurse  Labs/tests ordered:   None

## 2024-04-11 ENCOUNTER — Encounter: Payer: Self-pay | Admitting: Nurse Practitioner

## 2024-04-11 NOTE — Assessment & Plan Note (Signed)
 on Levothyroxine, TSH 1.41 07/10/23

## 2024-04-11 NOTE — Assessment & Plan Note (Signed)
 09/29/22 CXR CHF, 10/01/22 CXR moderate CHF, on Furosemide daily, Bun/creat 11/0.82 11/01/23, 10/06/22 Echo EF 75%

## 2024-04-11 NOTE — Assessment & Plan Note (Signed)
 Gait abnormality, w/c for mobility, risk for falling             Frequent fall: 2//2 lack of safety awareness, increased frailty, last fall 04/09/24 the patient was found lying on her back, anxious and agitated, no apparent injury, VS/neurochecks initiated. Needs close supervision and assistance

## 2024-04-11 NOTE — Assessment & Plan Note (Signed)
 lack of safety awareness,  resides in SNF [facility] for safety, care assistance, failed discontinuation of  Donepezil

## 2024-04-11 NOTE — Assessment & Plan Note (Signed)
Her mood is stabilizing, but still anxious at times, on Escitalopram, prn Lorazepam 

## 2024-05-16 ENCOUNTER — Non-Acute Institutional Stay (SKILLED_NURSING_FACILITY): Payer: Self-pay | Admitting: Sports Medicine

## 2024-05-16 ENCOUNTER — Encounter: Payer: Self-pay | Admitting: Sports Medicine

## 2024-05-16 DIAGNOSIS — I509 Heart failure, unspecified: Secondary | ICD-10-CM | POA: Diagnosis not present

## 2024-05-16 DIAGNOSIS — F0390 Unspecified dementia without behavioral disturbance: Secondary | ICD-10-CM | POA: Diagnosis not present

## 2024-05-16 DIAGNOSIS — E039 Hypothyroidism, unspecified: Secondary | ICD-10-CM | POA: Diagnosis not present

## 2024-05-16 DIAGNOSIS — F339 Major depressive disorder, recurrent, unspecified: Secondary | ICD-10-CM

## 2024-05-16 DIAGNOSIS — M159 Polyosteoarthritis, unspecified: Secondary | ICD-10-CM

## 2024-05-16 DIAGNOSIS — Z66 Do not resuscitate: Secondary | ICD-10-CM

## 2024-05-16 NOTE — Progress Notes (Signed)
 Location:  [facility] Room Number: [phone] Place of Service:  SNF (31) Provider:  Sherlynn Madden, MD  Patient Care Team: Sherlynn Madden, MD as PCP - General (Internal Medicine) Mast, Man X, NP as Nurse Practitioner (Internal Medicine)  Extended Emergency Contact Information Primary Emergency Contact: Fiero,[name] Address: [address]          [facility] [place], [place] [address] [place] Home Phone: [phone] Mobile Phone: [phone] Relation: Son Secondary Emergency Contact: Knope,Brett Address: [address]          [place], [place] [address] [place] Home Phone: [phone] Mobile Phone: [phone] Relation: Son  Code Status:  DNR Goals of care: Advanced Directive information    04/10/2024   11:04 AM  Advanced Directives  Does Patient Have a Medical Advance Directive? Yes  Type of Advance Directive Living will;Out of facility DNR (pink MOST or yellow form)  Does patient want to make changes to medical advance directive? No - Patient declined  Pre-existing out of facility DNR order (yellow form or pink MOST form) Pink MOST/Yellow Form most recent copy in chart - Physician notified to receive inpatient order     Chief Complaint  Patient presents with   Medical Management of Chronic Issues    Routine visit    HPI:  Pt is a 89 y.o. female with PMH of  frequent falls, Dementia, Hypothyroidism, GAD is seen today for medical management of chronic diseases.   Pt seen and examined in the living room. She is laying on the couch, opens her eyes on calling her name. Seems pleasant and comfortable and does not appear to be in distress. As per staff pt is high risk of falling and 3 falls since June  03/10/24, 03/18/24, 04/09/24. Ambulates with wheel chair, has bed and chair alarm. Pt denies headache, nausea, abdominal pain.  05/12/2024 13:00 154.7 Lbs (Wheelchair) 04/11/2024 12:52 154.2 Lbs  (Wheelchair) 03/13/2024 09:51 154.9 Lbs (Wheelchair) 03/11/2024 12:11 153 Lbs (Wheelchair) 02/22/2024 09:07 153.8 Lbs (Wheelchair) 02/14/2024 09:59 153.8 Lbs (Wheelchair   Past Medical History:  Diagnosis Date   Eczema    Hypothyroid    Hypothyroidism    Hypothyroidism    Vitamin D  deficiency    Past Surgical History:  Procedure Laterality Date   CHOLECYSTECTOMY N/A 06/24/2013   Procedure: LAPAROSCOPIC CHOLECYSTECTOMY WITH INTRAOPERATIVE CHOLANGIOGRAM;  Surgeon: Lynda Leos, MD;  Location: [facility] OR;  Service: General;  Laterality: N/A;   TONSILLECTOMY      No Known Allergies  Outpatient Encounter Medications as of 05/16/2024  Medication Sig   acetaminophen  (TYLENOL ) 650 MG CR tablet Take 650 mg by mouth every 4 (four) hours as needed for pain or fever.   aspirin  EC 81 MG tablet Take 81 mg by mouth daily. Swallow whole.   calcium -vitamin D  (OSCAL WITH D) 500-200 MG-UNIT tablet Take 1 tablet by mouth 2 (two) times daily.   camphor-menthol (SARNA) lotion Apply 1 Application topically as directed. Apply to neck, abdomen, BLE/BUE topically two times   donepezil  (ARICEPT ) 5 MG tablet Take 5 mg by mouth at bedtime.   Emollient (AQUAPHOR OINTMENT BODY EX) Apply to Back, Chest, BLE, BUE topically one time a day for Dry Skin;Itching;Rash related to DERMATITIS, UNSPECIFIED (L30.9) Apply Aquaphor Ointment to rashes on back chest BUE &BLE daily   escitalopram  (LEXAPRO ) 20 MG tablet Take 20 mg by mouth daily.   furosemide (LASIX) 20 MG tablet Take 20 mg by mouth daily.   lactose free nutrition (  BOOST) LIQD Take by mouth daily. nourishment give 1 bottle with 1 cup vanilla ice cream at 10 am   levothyroxine  (SYNTHROID ) 100 MCG tablet Take 100 mcg by mouth. one time a day every Sat, Sun   levothyroxine  (SYNTHROID ) 88 MCG tablet Take 88 mcg by mouth daily. Mon, Tue, Wed, Thu, Fr   loratadine (CLARITIN) 10 MG tablet Take 10 mg by mouth daily.   melatonin 1 MG TABS tablet Take 1 mg by mouth at  bedtime.   potassium chloride  (MICRO-K ) 10 MEQ CR capsule Take 10 mEq by mouth daily.   Sodium Fluoride (PREVIDENT 5000 BOOSTER PLUS) 1.1 % PSTE Place 1 application  onto teeth at bedtime. for oral care USE A PEA SIZE AMOUNT DURING NIGHTTIME OAL CARE. SPIT OUT ANY EXCESS. DO NOT RINSE. DO NOT EAT/DRINK  FOR 30 MINS AFTER   triamcinolone cream (KENALOG) 0.1 % Apply 1 Application topically 2 (two) times daily.   acetaminophen  (TYLENOL ) 325 MG tablet Take 650 mg by mouth every 4 (four) hours as needed for fever. (Patient not taking: Reported on 05/16/2024)   LORazepam  (ATIVAN ) 0.5 MG tablet Take 1 tablet (0.5 mg total) by mouth every 12 (twelve) hours as needed for anxiety. Agitation.  Give 1 tablet by mouth every 12 hours as needed for agitation for 14 Days (Patient not taking: Reported on 05/16/2024)   terbinafine (LAMISIL) 250 MG tablet Take 250 mg by mouth daily. (Patient not taking: Reported on 05/16/2024)   No facility-administered encounter medications on file as of 05/16/2024.    Review of Systems  Constitutional:  Negative for chills and fever.  Respiratory:  Negative for cough and shortness of breath.   Cardiovascular:  Negative for chest pain.  Gastrointestinal:  Negative for abdominal pain, nausea and vomiting.  Genitourinary:  Negative for dysuria.  Neurological:  Negative for dizziness.    Immunization History  Administered Date(s) Administered   Fluad Quad(high Dose 65+) 07/03/2022, 06/13/2023   INFLUENZA, HIGH DOSE SEASONAL PF 06/13/2023   Influenza Whole 06/15/2018   Influenza,inj,Quad PF,6+ Mos 06/25/2013   Influenza-Unspecified 06/23/2020, 06/30/2021   Moderna Covid-19 Vaccine Bivalent Booster 46yrs & up 06/27/2023   Moderna SARS-COV2 Booster Vaccination 06/27/2023   Moderna Sars-Covid-2 Vaccination 09/13/2019, 10/11/2019, 07/20/2020, 02/08/2021   PPD Test 08/16/2023   Pfizer Covid-19 Vaccine Bivalent Booster 51yrs & up 05/31/2021, 04/19/2022, 07/13/2022   Pneumococcal  Conjugate-13 03/11/2014   Pneumococcal Polysaccharide-23 09/11/1986, 09/11/2001, 06/25/2013   RSV,unspecified 10/29/2022   Tdap 03/16/2011, 12/22/2021   Unspecified SARS-COV-2 Vaccination 05/31/2021   Zoster Recombinant(Shingrix) 09/12/2007, 09/02/2022   Zoster, Unspecified 11/21/2022   Pertinent  Health Maintenance Due  Topic Date Due   Influenza Vaccine  06/11/2024 (Originally 04/11/2024)   DEXA SCAN  Completed      01/28/2022    3:26 PM 02/15/2022   11:35 AM 04/07/2022   10:29 AM 08/10/2022    9:16 AM 09/19/2022   11:39 AM  Fall Risk  Falls in the past year?  1 1  0  Was there an injury with Fall?  1 1  0  Fall Risk Category Calculator  3 3  0  Fall Risk Category (Retired)  [employer]   Low   (RETIRED) Patient Fall Risk Level High fall risk  High fall risk  High fall risk  High fall risk  Low fall risk   Patient at Risk for Falls Due to  History of fall(s) History of fall(s)  No Fall Risks  Fall risk Follow up  Falls evaluation  completed  Falls evaluation completed;Education provided   Falls evaluation completed      Data saved with a previous flowsheet row definition   Functional Status Survey:    Vitals:   05/16/24 0932  BP: 128/62  Pulse: 65  Weight: 154 lb 11.2 oz (70.2 kg)  Height: 5' (1.524 m)   Body mass index is 30.21 kg/m. Physical Exam Constitutional:      Appearance: Normal appearance.  HENT:     Head: Normocephalic and atraumatic.  Cardiovascular:     Rate and Rhythm: Normal rate and regular rhythm.  Pulmonary:     Effort: Pulmonary effort is normal. No respiratory distress.     Breath sounds: Normal breath sounds. No wheezing.  Abdominal:     General: Bowel sounds are normal. There is no distension.     Tenderness: There is no abdominal tenderness. There is no guarding or rebound.     Comments:    Musculoskeletal:        General: No swelling.  Neurological:     Mental Status: She is alert. Mental status is at baseline.     Motor: No weakness.      Labs reviewed: Recent Labs    09/28/23 0000 11/01/23 0000 03/11/24 0000  NA 140 141 140  K 4.2 4.4 4.2  CL 105 106 105  CO2 25* 27* 29*  BUN 17 11 20   CREATININE 1.0 0.8 1.0  CALCIUM  9.2 9.0 9.0   Recent Labs    09/28/23 0000 03/11/24 0000 04/01/24 0000  AST 22 18 18   ALT 14 23 15   ALKPHOS 109 65 61  ALBUMIN 3.2* 3.4* 3.3*   Recent Labs    07/10/23 0000 09/11/23 0000 09/28/23 0000 10/02/23 0000 03/11/24 0000  WBC 7.9 6.7 25.9 8.9 7.7  NEUTROABS 4,045.00  --  21,471.00 4,788.00  --   HGB 15.4 13.4 14.8  --  14.8  HCT 46 40 43 45 46  PLT 203 136* 209 228 172   Lab Results  Component Value Date   TSH 7.38 (A) 03/11/2024   Lab Results  Component Value Date   HGBA1C 5.7 06/18/2018   Lab Results  Component Value Date   CHOL 188 01/30/2018   HDL 64 01/30/2018   LDLCALC 106 (H) 01/30/2018   TRIG 89 01/30/2018   CHOLHDL 2.9 01/30/2018    Significant Diagnostic Results in last 30 days:  No results found.  Assessment/Plan  Major Neurocognitive disorder As per staff pt wanders and high risk of falling Cont with supportive care Cont with aricept    GAD/ Depression Cont with lexapro   Hypothyroidism Cont with levothyroxine    CHF Euvolemic on exam Cont with lasix, potassium supplements Avoid salty foods  Generalized osteoarthritis Cont with tylenol  prn

## 2024-05-19 ENCOUNTER — Encounter: Payer: Self-pay | Admitting: Sports Medicine

## 2024-05-27 LAB — TSH: TSH: 3.06 (ref 0.41–5.90)

## 2024-06-16 ENCOUNTER — Encounter: Payer: Self-pay | Admitting: Nurse Practitioner

## 2024-06-16 ENCOUNTER — Non-Acute Institutional Stay (SKILLED_NURSING_FACILITY): Admitting: Nurse Practitioner

## 2024-06-16 DIAGNOSIS — R269 Unspecified abnormalities of gait and mobility: Secondary | ICD-10-CM

## 2024-06-16 DIAGNOSIS — E039 Hypothyroidism, unspecified: Secondary | ICD-10-CM

## 2024-06-16 DIAGNOSIS — I959 Hypotension, unspecified: Secondary | ICD-10-CM

## 2024-06-16 DIAGNOSIS — F339 Major depressive disorder, recurrent, unspecified: Secondary | ICD-10-CM | POA: Diagnosis not present

## 2024-06-16 DIAGNOSIS — F0390 Unspecified dementia without behavioral disturbance: Secondary | ICD-10-CM | POA: Diagnosis not present

## 2024-06-16 NOTE — Assessment & Plan Note (Signed)
 lack of safety awareness,  resides in SNF [facility] for safety, care assistance, failed discontinuation of  Donepezil

## 2024-06-16 NOTE — Assessment & Plan Note (Signed)
 Gait abnormality, w/c for mobility, risk for falling             Frequent fall: 2//2 lack of safety awareness, increased frailty.  Needs close supervision and assistance

## 2024-06-16 NOTE — Assessment & Plan Note (Signed)
 Euvolemic,  09/29/22 CXR CHF, 10/01/22 CXR moderate CHF, on Furosemide daily, 10/06/22 Echo EF 75%

## 2024-06-16 NOTE — Assessment & Plan Note (Signed)
Her mood is stabilizing, but still anxious at times, on Escitalopram, prn Lorazepam 

## 2024-06-16 NOTE — Progress Notes (Unsigned)
 Location:   [facility] Room Number: 2B Place of Service:  SNF (31) Provider: Larwance Gayland Nicol NP  Sherlynn Madden, MD  Patient Care Team: Sherlynn Madden, MD as PCP - General (Internal Medicine) Simran Bomkamp X, NP as Nurse Practitioner (Internal Medicine)  Extended Emergency Contact Information Primary Emergency Contact: Kincade,[name] Address: [address]          [facility] [place], [place] [address] [place] Home Phone: [phone] Mobile Phone: [phone] Relation: Son Secondary Emergency Contact: Obarr,Brett Address: [address]          [place], [place] [address] [place] Home Phone: [phone] Mobile Phone: [phone] Relation: Son  Code Status:  DNR Goals of care: Advanced Directive information    04/10/2024   11:04 AM  Advanced Directives  Does Patient Have a Medical Advance Directive? Yes  Type of Advance Directive Living will;Out of facility DNR (pink MOST or yellow form)  Does patient want to make changes to medical advance directive? No - Patient declined  Pre-existing out of facility DNR order (yellow form or pink MOST form) Pink MOST/Yellow Form most recent copy in chart - Physician notified to receive inpatient order     Chief Complaint  Patient presents with  . Medical Management of Chronic Issues    HPI:  Pt is a 89 y.o. female seen today for medical management of chronic diseases.     Hx of skin rash/Eczema, resolved on oral prednisone, topical Triamcinolone and Ketoconazole. On Terbinafine x 30 days since 02/19/24 per dermatology.              09/29/22 CXR CHF, 10/01/22 CXR moderate CHF, on Furosemide daily, 10/06/22 Echo EF 75%             Gait abnormality, w/c for mobility, risk for falling             Frequent fall: 2//2 lack of safety awareness, increased frailty.  Needs close supervision and assistance              Dementia, lack of safety awareness,  resides in SNF [facility] for safety, care assistance,  failed discontinuation of  Donepezil               Her mood is stabilizing, but still anxious at times, on Escitalopram , prn Lorazepam              Hypothyroidism, on Levothyroxine , TSH 3.06 05/27/24                   Past Medical History:  Diagnosis Date  . Eczema   . Hypothyroid   . Hypothyroidism   . Hypothyroidism   . Vitamin D  deficiency    Past Surgical History:  Procedure Laterality Date  . CHOLECYSTECTOMY N/A 06/24/2013   Procedure: LAPAROSCOPIC CHOLECYSTECTOMY WITH INTRAOPERATIVE CHOLANGIOGRAM;  Surgeon: Lynda Leos, MD;  Location: [facility] OR;  Service: General;  Laterality: N/A;  . TONSILLECTOMY      No Known Allergies  Allergies as of 06/16/2024   No Known Allergies      Medication List        Accurate as of June 16, 2024  1:11 PM. If you have any questions, ask your nurse or doctor.          acetaminophen  325 MG tablet Commonly known as: TYLENOL  Take 650 mg by mouth every 4 (four) hours as needed for fever.   acetaminophen  650 MG CR tablet Commonly known as: TYLENOL  Take 650 mg by mouth  every 4 (four) hours as needed for pain or fever.   AQUAPHOR OINTMENT BODY EX Apply to Back, Chest, BLE, BUE topically one time a day for Dry Skin;Itching;Rash related to DERMATITIS, UNSPECIFIED (L30.9) Apply Aquaphor Ointment to rashes on back chest BUE &BLE daily   aspirin  EC 81 MG tablet Take 81 mg by mouth daily. Swallow whole.   calcium -vitamin D  500-200 MG-UNIT tablet Commonly known as: OSCAL WITH D Take 1 tablet by mouth 2 (two) times daily.   donepezil  5 MG tablet Commonly known as: ARICEPT  Take 5 mg by mouth at bedtime.   escitalopram  20 MG tablet Commonly known as: LEXAPRO  Take 20 mg by mouth daily.   furosemide 20 MG tablet Commonly known as: LASIX Take 20 mg by mouth daily.   lactose free nutrition Liqd Take by mouth daily. nourishment give 1 bottle with 1 cup vanilla ice cream at 10 am   levothyroxine  88 MCG tablet Commonly known as:  SYNTHROID  Take 88 mcg by mouth daily. Mon, Tue, Wed, Thu, Fr   levothyroxine  100 MCG tablet Commonly known as: SYNTHROID  Take 100 mcg by mouth. one time a day every Sat, Sun   loratadine 10 MG tablet Commonly known as: CLARITIN Take 10 mg by mouth daily.   LORazepam  0.5 MG tablet Commonly known as: ATIVAN  Take 1 tablet (0.5 mg total) by mouth every 12 (twelve) hours as needed for anxiety. Agitation.  Give 1 tablet by mouth every 12 hours as needed for agitation for 14 Days   melatonin 1 MG Tabs tablet Take 1 mg by mouth at bedtime.   potassium chloride  10 MEQ CR capsule Commonly known as: MICRO-K  Take 10 mEq by mouth daily.   PreviDent 5000 Booster Plus 1.1 % Pste Generic drug: Sodium Fluoride Place 1 application  onto teeth at bedtime. for oral care USE A PEA SIZE AMOUNT DURING NIGHTTIME OAL CARE. SPIT OUT ANY EXCESS. DO NOT RINSE. DO NOT EAT/DRINK  FOR 30 MINS AFTER   Sarna lotion Generic drug: camphor-menthol Apply 1 Application topically as directed. Apply to neck, abdomen, BLE/BUE topically two times   terbinafine 250 MG tablet Commonly known as: LAMISIL Take 250 mg by mouth daily.   triamcinolone cream 0.1 % Commonly known as: KENALOG Apply 1 Application topically 2 (two) times daily.        Review of Systems  Constitutional:  Negative for appetite change, fatigue and fever.  HENT:  Positive for hearing loss. Negative for congestion and trouble swallowing.   Eyes:  Negative for visual disturbance.  Respiratory:  Negative for cough, shortness of breath and wheezing.   Cardiovascular:  Negative for leg swelling.  Gastrointestinal:  Negative for abdominal pain and constipation.  Genitourinary:  Negative for dysuria and urgency.  Musculoskeletal:  Positive for gait problem.  Skin:  Negative for color change.  Neurological:  Negative for speech difficulty and weakness.       Memory lapses.   Psychiatric/Behavioral:  Positive for confusion. Negative for sleep  disturbance. The patient is nervous/anxious.     Immunization History  Administered Date(s) Administered  . Fluad Quad(high Dose 65+) 07/03/2022, 06/13/2023  . INFLUENZA, HIGH DOSE SEASONAL PF 06/13/2023  . Influenza Whole 06/15/2018  . Influenza,inj,Quad PF,6+ Mos 06/25/2013  . Influenza-Unspecified 06/23/2020, 06/30/2021  . Moderna Covid-19 Vaccine Bivalent Booster 20yrs & up 06/27/2023  . Moderna SARS-COV2 Booster Vaccination 06/27/2023  . Moderna Sars-Covid-2 Vaccination 09/13/2019, 10/11/2019, 07/20/2020, 02/08/2021  . PPD Test 08/16/2023  . Pfizer Covid-19 Vaccine Bivalent Booster 26yrs &  up 05/31/2021, 04/19/2022, 07/13/2022  . Pneumococcal Conjugate-13 03/11/2014  . Pneumococcal Polysaccharide-23 09/11/1986, 09/11/2001, 06/25/2013  . RSV,unspecified 10/29/2022  . Tdap 03/16/2011, 12/22/2021  . Unspecified SARS-COV-2 Vaccination 05/31/2021  . Zoster Recombinant(Shingrix) 09/12/2007, 09/02/2022  . Zoster, Unspecified 11/21/2022   Pertinent  Health Maintenance Due  Topic Date Due  . Influenza Vaccine  04/11/2024  . DEXA SCAN  Completed      01/28/2022    3:26 PM 02/15/2022   11:35 AM 04/07/2022   10:29 AM 08/10/2022    9:16 AM 09/19/2022   11:39 AM  Fall Risk  Falls in the past year?  1 1  0  Was there an injury with Fall?  1 1  0  Fall Risk Category Calculator  3 3  0  Fall Risk Category (Retired)  [employer]   Low   (RETIRED) Patient Fall Risk Level High fall risk  High fall risk  High fall risk  High fall risk  Low fall risk   Patient at Risk for Falls Due to  History of fall(s) History of fall(s)  No Fall Risks  Fall risk Follow up  Falls evaluation completed  Falls evaluation completed;Education provided   Falls evaluation completed      Data saved with a previous flowsheet row definition   Functional Status Survey:    Vitals:   06/16/24 1311  BP: (!) 118/52  Pulse: (!) 57  Resp: 16  Temp: 97.8 F (36.6 C)  SpO2: 94%  Weight: 154 lb 14.4 oz (70.3 kg)    Body mass index is 30.25 kg/m. Physical Exam Vitals and nursing note reviewed.  Constitutional:      Appearance: Normal appearance.  HENT:     Head: Normocephalic and atraumatic.     Nose: Nose normal.     Mouth/Throat:     Mouth: Mucous membranes are moist.  Eyes:     Extraocular Movements: Extraocular movements intact.     Conjunctiva/sclera: Conjunctivae normal.     Pupils: Pupils are equal, round, and reactive to light.  Cardiovascular:     Rate and Rhythm: Normal rate and regular rhythm.     Heart sounds: No murmur heard. Pulmonary:     Effort: Pulmonary effort is normal.     Breath sounds: No wheezing or rales.  Abdominal:     General: Bowel sounds are normal.     Palpations: Abdomen is soft.     Tenderness: There is no abdominal tenderness.     Hernia: A hernia is present.     Comments: Umbilical hernia.   Musculoskeletal:     Cervical back: Normal range of motion and neck supple.     Right lower leg: No edema.     Left lower leg: No edema.  Skin:    General: Skin is warm and dry.  Neurological:     General: No focal deficit present.     Mental Status: She is alert. Mental status is at baseline.     Gait: Gait abnormal.     Comments: Oriented to person  Psychiatric:        Mood and Affect: Mood normal.        Behavior: Behavior normal.     Labs reviewed: Recent Labs    09/28/23 0000 11/01/23 0000 03/11/24 0000  NA 140 141 140  K 4.2 4.4 4.2  CL 105 106 105  CO2 25* 27* 29*  BUN 17 11 20   CREATININE 1.0 0.8 1.0  CALCIUM  9.2 9.0  9.0   Recent Labs    09/28/23 0000 03/11/24 0000 04/01/24 0000  AST 22 18 18   ALT 14 23 15   ALKPHOS 109 65 61  ALBUMIN 3.2* 3.4* 3.3*   Recent Labs    07/10/23 0000 09/11/23 0000 09/28/23 0000 10/02/23 0000 03/11/24 0000  WBC 7.9 6.7 25.9 8.9 7.7  NEUTROABS 4,045.00  --  21,471.00 4,788.00  --   HGB 15.4 13.4 14.8  --  14.8  HCT 46 40 43 45 46  PLT 203 136* 209 228 172   Lab Results  Component Value  Date   TSH 7.38 (A) 03/11/2024   Lab Results  Component Value Date   HGBA1C 5.7 06/18/2018   Lab Results  Component Value Date   CHOL 188 01/30/2018   HDL 64 01/30/2018   LDLCALC 106 (H) 01/30/2018   TRIG 89 01/30/2018   CHOLHDL 2.9 01/30/2018    Significant Diagnostic Results in last 30 days:  No results found.  Assessment/Plan  No problem-specific Assessment & Plan notes found for this encounter.   Family/ staff Communication: plan of care reviewed with the patient and charge nurse.   Labs/tests ordered:  none

## 2024-06-16 NOTE — Assessment & Plan Note (Signed)
 on Levothyroxine , TSH 3.06 05/27/24

## 2024-06-17 NOTE — Assessment & Plan Note (Signed)
 Blood pressure runs low, only takes furosemide

## 2024-06-19 ENCOUNTER — Encounter: Payer: Self-pay | Admitting: Nurse Practitioner

## 2024-06-19 ENCOUNTER — Other Ambulatory Visit: Payer: Self-pay | Admitting: Nurse Practitioner

## 2024-06-19 DIAGNOSIS — F419 Anxiety disorder, unspecified: Secondary | ICD-10-CM

## 2024-06-19 MED ORDER — LORAZEPAM 0.5 MG PO TABS: 0.5000 mg | ORAL_TABLET | Freq: Two times a day (BID) | ORAL | 0 refills | Status: AC | PRN

## 2024-07-08 ENCOUNTER — Encounter: Payer: Self-pay | Admitting: Sports Medicine

## 2024-07-08 NOTE — Telephone Encounter (Signed)
 I will talk to her tomorrow, I am at [facility] now.

## 2024-07-08 NOTE — Telephone Encounter (Signed)
 Skilled Resident at [facility], all communication to be handled through facility staff.

## 2024-07-09 ENCOUNTER — Telehealth: Payer: Self-pay | Admitting: Sports Medicine

## 2024-07-09 NOTE — Telephone Encounter (Signed)
 Son wants it from [facility] letterhead.

## 2024-07-10 NOTE — Telephone Encounter (Signed)
 Form signed and gave to household coordinator at [facility]

## 2024-07-15 ENCOUNTER — Non-Acute Institutional Stay (SKILLED_NURSING_FACILITY): Payer: Self-pay | Admitting: Nurse Practitioner

## 2024-07-15 ENCOUNTER — Encounter: Payer: Self-pay | Admitting: Nurse Practitioner

## 2024-07-15 DIAGNOSIS — I509 Heart failure, unspecified: Secondary | ICD-10-CM

## 2024-07-15 DIAGNOSIS — I959 Hypotension, unspecified: Secondary | ICD-10-CM | POA: Diagnosis not present

## 2024-07-15 DIAGNOSIS — F339 Major depressive disorder, recurrent, unspecified: Secondary | ICD-10-CM | POA: Diagnosis not present

## 2024-07-15 DIAGNOSIS — F0390 Unspecified dementia without behavioral disturbance: Secondary | ICD-10-CM

## 2024-07-15 NOTE — Assessment & Plan Note (Signed)
 09/29/22 CXR CHF, 10/01/22 CXR moderate CHF, on Furosemide daily, 10/06/22 Echo EF 75% Euvolemic presently.

## 2024-07-15 NOTE — Assessment & Plan Note (Signed)
 Stable, continue  Levothyroxine , TSH 3.06 05/27/24

## 2024-07-15 NOTE — Assessment & Plan Note (Signed)
 Her mood is stabilizing, but still anxious at times, on Escitalopram , Depakote, prn Lorazepam 

## 2024-07-15 NOTE — Progress Notes (Signed)
 Location: [facility] Occupational of Service:  N002-B Provider:  Adline Scot, NP  Sherlynn Madden, MD  Patient Care Team: Sherlynn Madden, MD as PCP - General (Internal Medicine) Braelyn Jenson X, NP as Nurse Practitioner (Internal Medicine)  Extended Emergency Contact Information Primary Emergency Contact: Carlton,[name] Address: [address]          [facility] [place], [place] [address] [place] Home Phone: [phone] Mobile Phone: [phone] Relation: Son Secondary Emergency Contact: Bayle,Brett Address: [address]          [place], [place] [address] [place] Home Phone: [phone] Mobile Phone: [phone] Relation: Son  Code Status:  DNR Goals of care: Advanced Directive information    07/15/2024   11:21 AM  Advanced Directives  Does Patient Have a Medical Advance Directive? Yes  Type of Advance Directive Living will;Out of facility DNR (pink MOST or yellow form)  Does patient want to make changes to medical advance directive? No - Patient declined  Pre-existing out of facility DNR order (yellow form or pink MOST form) Pink MOST form placed in chart (order not valid for inpatient use);Yellow form placed in chart (order not valid for inpatient use)     Chief Complaint  Patient presents with   Medical Management of Chronic Issues    Routine visit  [facility] - SNF  Room N002-B Admitted : 08/16/2023    HPI:  Pt is a 89 y.o. female seen today for medical management of chronic diseases.     Hx of skin rash/Eczema, resolved on oral prednisone, topical Triamcinolone and Ketoconazole. On Terbinafine x 30 days since 02/19/24 per dermatology.              09/29/22 CXR CHF, 10/01/22 CXR moderate CHF, on Furosemide daily, 10/06/22 Echo EF 75%             Gait abnormality, w/c for mobility, risk for falling             Frequent fall: 2//2 lack of safety awareness, increased frailty.  Needs close supervision and assistance               Dementia, lack of safety awareness,  resides in SNF [facility] for safety, care assistance, failed discontinuation of  Donepezil               Her mood is stabilizing, but still anxious at times, on Escitalopram , Depakote, prn Lorazepam              Hypothyroidism, on Levothyroxine , TSH 3.06 05/27/24                    Past Medical History:  Diagnosis Date   Eczema    Hypothyroid    Hypothyroidism    Hypothyroidism    Vitamin D  deficiency    Past Surgical History:  Procedure Laterality Date   CHOLECYSTECTOMY N/A 06/24/2013   Procedure: LAPAROSCOPIC CHOLECYSTECTOMY WITH INTRAOPERATIVE CHOLANGIOGRAM;  Surgeon: Lynda Leos, MD;  Location: [facility];  Service: General;  Laterality: N/A;   TONSILLECTOMY      No Known Allergies  Allergies as of 07/15/2024   No Known Allergies      Medication List        Accurate as of July 15, 2024 11:59 PM. If you have any questions, ask your nurse or doctor.          acetaminophen  650 MG CR tablet Commonly known as: TYLENOL  Take 650 mg by mouth every 4 (four) hours as needed  for pain or fever.   AQUAPHOR OINTMENT BODY EX Apply to Back, Chest, BLE, BUE topically one time a day for Dry Skin;Itching;Rash related to DERMATITIS, UNSPECIFIED (L30.9) Apply Aquaphor Ointment to rashes on back chest BUE &BLE daily   aspirin  EC 81 MG tablet Take 81 mg by mouth daily. Swallow whole.   calcium -vitamin D  500-200 MG-UNIT tablet Commonly known as: OSCAL WITH D Take 1 tablet by mouth 2 (two) times daily.   divalproex 125 MG DR tablet Commonly known as: DEPAKOTE Take 125 mg by mouth 2 (two) times daily. What changed:  when to take this additional instructions   donepezil  5 MG tablet Commonly known as: ARICEPT  Take 5 mg by mouth at bedtime.   escitalopram  20 MG tablet Commonly known as: LEXAPRO  Take 20 mg by mouth daily.   furosemide 20 MG tablet Commonly known as: LASIX Take 20 mg by mouth daily.   lactose free nutrition Liqd Take by  mouth daily. nourishment give 1 bottle with 1 cup vanilla ice cream at 10 am   levothyroxine  88 MCG tablet Commonly known as: SYNTHROID  Take 88 mcg by mouth daily. Mon, Tue, Wed, Thu, Fr   levothyroxine  100 MCG tablet Commonly known as: SYNTHROID  Take 100 mcg by mouth. one time a day every Sat, Sun   loratadine 10 MG tablet Commonly known as: CLARITIN Take 10 mg by mouth daily.   LORazepam  0.5 MG tablet Commonly known as: ATIVAN  Take 0.5 mg by mouth every 12 (twelve) hours as needed for anxiety. For 15 days   melatonin 1 MG Tabs tablet Take 1 mg by mouth at bedtime.   potassium chloride  10 MEQ CR capsule Commonly known as: MICRO-K  Take 10 mEq by mouth daily.   PreviDent 5000 Booster Plus 1.1 % Pste Generic drug: Sodium Fluoride Place 1 application  onto teeth at bedtime. for oral care USE A PEA SIZE AMOUNT DURING NIGHTTIME OAL CARE. SPIT OUT ANY EXCESS. DO NOT RINSE. DO NOT EAT/DRINK  FOR 30 MINS AFTER   Sarna lotion Generic drug: camphor-menthol Apply 1 Application topically as directed. Apply to neck, abdomen, BLE/BUE topically two times   triamcinolone cream 0.1 % Commonly known as: KENALOG Apply 1 Application topically 2 (two) times daily.        Review of Systems  Constitutional:  Negative for appetite change, fatigue and fever.  HENT:  Positive for hearing loss. Negative for congestion and trouble swallowing.   Eyes:  Negative for visual disturbance.  Respiratory:  Negative for cough, shortness of breath and wheezing.   Cardiovascular:  Negative for leg swelling.  Gastrointestinal:  Negative for abdominal pain and constipation.  Genitourinary:  Negative for dysuria and urgency.  Musculoskeletal:  Positive for gait problem.  Skin:  Negative for color change.  Neurological:  Negative for speech difficulty and weakness.       Memory lapses.   Psychiatric/Behavioral:  Positive for confusion. Negative for sleep disturbance. The patient is nervous/anxious.      Immunization History  Administered Date(s) Administered   Fluad Quad(high Dose 65+) 07/03/2022, 06/13/2023   INFLUENZA, HIGH DOSE SEASONAL PF 06/13/2023   Influenza Whole 06/15/2018   Influenza,inj,Quad PF,6+ Mos 06/25/2013   Influenza-Unspecified 06/23/2020, 06/30/2021, 06/17/2024   Moderna Covid-19 Vaccine Bivalent Booster 72yrs & up 06/27/2023   Moderna SARS-COV2 Booster Vaccination 06/27/2023   Moderna Sars-Covid-2 Vaccination 09/13/2019, 10/11/2019, 07/20/2020, 02/08/2021   PPD Test 08/16/2023   Pfizer Covid-19 Vaccine Bivalent Booster 63yrs & up 05/31/2021, 04/19/2022, 07/13/2022   Pfizer(Comirnaty)Fall Seasonal Vaccine  12 years and older 07/07/2024   Pneumococcal Conjugate-13 03/11/2014   Pneumococcal Polysaccharide-23 09/11/1986, 09/11/2001, 06/25/2013   RSV,unspecified 10/29/2022   Tdap 03/16/2011, 12/22/2021   Unspecified SARS-COV-2 Vaccination 05/31/2021   Zoster Recombinant(Shingrix) 09/12/2007, 09/02/2022   Zoster, Unspecified 11/21/2022   Pertinent  Health Maintenance Due  Topic Date Due   Influenza Vaccine  Completed   DEXA SCAN  Completed      01/28/2022    3:26 PM 02/15/2022   11:35 AM 04/07/2022   10:29 AM 08/10/2022    9:16 AM 09/19/2022   11:39 AM  Fall Risk  Falls in the past year?  1 1  0  Was there an injury with Fall?  1 1  0  Fall Risk Category Calculator  3 3  0  Fall Risk Category (Retired)  [employer]   Low   (RETIRED) Patient Fall Risk Level High fall risk  High fall risk  High fall risk  High fall risk  Low fall risk   Patient at Risk for Falls Due to  History of fall(s) History of fall(s)  No Fall Risks  Fall risk Follow up  Falls evaluation completed  Falls evaluation completed;Education provided   Falls evaluation completed      Data saved with a previous flowsheet row definition   Functional Status Survey:    Vitals:   07/15/24 1105 07/21/24 1202  BP: (!) 126/59 (!) 121/54  Pulse: 62   Temp: 98 F (36.7 C)   SpO2: 94%   Weight:  151 lb 6.4 oz (68.7 kg)   Height: 5' (1.524 m)    Body mass index is 29.57 kg/m. Physical Exam Vitals and nursing note reviewed.  Constitutional:      Appearance: Normal appearance.  HENT:     Head: Normocephalic and atraumatic.     Nose: Nose normal.     Mouth/Throat:     Mouth: Mucous membranes are moist.  Eyes:     Extraocular Movements: Extraocular movements intact.     Conjunctiva/sclera: Conjunctivae normal.     Pupils: Pupils are equal, round, and reactive to light.  Cardiovascular:     Rate and Rhythm: Normal rate and regular rhythm.     Heart sounds: No murmur heard. Pulmonary:     Effort: Pulmonary effort is normal.     Breath sounds: No wheezing or rales.  Abdominal:     General: Bowel sounds are normal.     Palpations: Abdomen is soft.     Tenderness: There is no abdominal tenderness.     Hernia: A hernia is present.     Comments: Umbilical hernia.   Musculoskeletal:     Cervical back: Normal range of motion and neck supple.     Right lower leg: No edema.     Left lower leg: No edema.  Skin:    General: Skin is warm and dry.  Neurological:     General: No focal deficit present.     Mental Status: She is alert. Mental status is at baseline.     Gait: Gait abnormal.     Comments: Oriented to person  Psychiatric:        Mood and Affect: Mood normal.        Behavior: Behavior normal.     Labs reviewed: Recent Labs    09/28/23 0000 11/01/23 0000 03/11/24 0000  NA 140 141 140  K 4.2 4.4 4.2  CL 105 106 105  CO2 25* 27* 29*  BUN 17  11 20  CREATININE 1.0 0.8 1.0  CALCIUM  9.2 9.0 9.0   Recent Labs    09/28/23 0000 03/11/24 0000 04/01/24 0000  AST 22 18 18   ALT 14 23 15   ALKPHOS 109 65 61  ALBUMIN 3.2* 3.4* 3.3*   Recent Labs    09/11/23 0000 09/28/23 0000 10/02/23 0000 03/11/24 0000  WBC 6.7 25.9 8.9 7.7  NEUTROABS  --  21,471.00 4,788.00  --   HGB 13.4 14.8  --  14.8  HCT 40 43 45 46  PLT 136* 209 228 172   Lab Results  Component  Value Date   TSH 7.38 (A) 03/11/2024   Lab Results  Component Value Date   HGBA1C 5.7 06/18/2018   Lab Results  Component Value Date   CHOL 188 01/30/2018   HDL 64 01/30/2018   LDLCALC 106 (H) 01/30/2018   TRIG 89 01/30/2018   CHOLHDL 2.9 01/30/2018    Significant Diagnostic Results in last 30 days:  No results found.  Assessment/Plan CHF (congestive heart failure) (HCC) 09/29/22 CXR CHF, 10/01/22 CXR moderate CHF, on Furosemide daily, 10/06/22 Echo EF 75% Euvolemic presently.   Major neurocognitive disorder (HCC) lack of safety awareness,  resides in SNF [facility] for safety, care assistance, failed discontinuation of  Donepezil    Depression, recurrent Her mood is stabilizing, but still anxious at times, on Escitalopram , Depakote, prn Lorazepam   Hypothyroidism Stable, continue  Levothyroxine , TSH 3.06 05/27/24  Hypotension Blood pressure runs low, only takes Furosemide 20mg  every day, recommend to change position slowly. Observe.      Family/ staff Communication: Plan of care reviewed with the patient and charge nurse  Labs/tests ordered: None

## 2024-07-15 NOTE — Assessment & Plan Note (Signed)
 lack of safety awareness,  resides in SNF [facility] for safety, care assistance, failed discontinuation of  Donepezil

## 2024-07-21 NOTE — Assessment & Plan Note (Signed)
 Blood pressure runs low, only takes Furosemide 20mg  every day, recommend to change position slowly. Observe.

## 2024-08-15 ENCOUNTER — Other Ambulatory Visit: Payer: Self-pay | Admitting: Nurse Practitioner

## 2024-08-15 MED ORDER — LORAZEPAM 0.5 MG PO TABS: 0.5000 mg | ORAL_TABLET | Freq: Two times a day (BID) | ORAL | 0 refills | Status: DC | PRN

## 2024-09-08 ENCOUNTER — Non-Acute Institutional Stay: Payer: Self-pay | Admitting: Nurse Practitioner

## 2024-09-08 ENCOUNTER — Encounter: Payer: Self-pay | Admitting: Nurse Practitioner

## 2024-09-08 DIAGNOSIS — R269 Unspecified abnormalities of gait and mobility: Secondary | ICD-10-CM

## 2024-09-08 DIAGNOSIS — I509 Heart failure, unspecified: Secondary | ICD-10-CM

## 2024-09-08 DIAGNOSIS — I959 Hypotension, unspecified: Secondary | ICD-10-CM | POA: Diagnosis not present

## 2024-09-08 DIAGNOSIS — E039 Hypothyroidism, unspecified: Secondary | ICD-10-CM | POA: Diagnosis not present

## 2024-09-08 DIAGNOSIS — F0390 Unspecified dementia without behavioral disturbance: Secondary | ICD-10-CM

## 2024-09-08 DIAGNOSIS — F339 Major depressive disorder, recurrent, unspecified: Secondary | ICD-10-CM | POA: Diagnosis not present

## 2024-09-08 NOTE — Assessment & Plan Note (Signed)
 09/29/22 CXR CHF, 10/01/22 CXR moderate CHF, on Furosemide daily, 10/06/22 Echo EF 75%, Bun/creat 20/1.0 03/11/24

## 2024-09-08 NOTE — Assessment & Plan Note (Signed)
 w/c for mobility, risk for falling

## 2024-09-08 NOTE — Assessment & Plan Note (Signed)
 Her mood is stabilizing, but still anxious at times, on Escitalopram , Depakote, prn Lorazepam 

## 2024-09-08 NOTE — Assessment & Plan Note (Signed)
 on Levothyroxine , TSH 3.06 05/27/24

## 2024-09-08 NOTE — Progress Notes (Unsigned)
 " Location:  [facility] Room Number: N002-B Place of Service:  SNF (31) Provider:  Dallas Scorsone X, NP    Patient Care Team: Sherlynn Madden, MD as PCP - General (Internal Medicine) Liora Myles X, NP as Nurse Practitioner (Internal Medicine)  Extended Emergency Contact Information Primary Emergency Contact: Eggleton,[name] Address: [address]          [facility] [place], [place] [address] [place] Home Phone: [phone] Mobile Phone: [phone] Relation: Son Secondary Emergency Contact: Rayfield,Brett Address: [address]          [place], [place] [address] [place] Home Phone: [phone] Mobile Phone: [phone] Relation: Son  Code Status:  DNR Goals of care: Advanced Directive information    09/08/2024    2:01 PM  Advanced Directives  Does Patient Have a Medical Advance Directive? Yes  Type of Advance Directive Living will;Out of facility DNR (pink MOST or yellow form);Healthcare Power of Attorney  Does patient want to make changes to medical advance directive? No - Patient declined  Copy of Healthcare Power of Attorney in Chart? Yes - validated most recent copy scanned in chart (See row information)  Pre-existing out of facility DNR order (yellow form or pink MOST form) Pink MOST form placed in chart (order not valid for inpatient use);Yellow form placed in chart (order not valid for inpatient use)     Chief Complaint  Patient presents with   Medical Management of Chronic Issues    Routine Visit, needs medicare annual wellness visit    HPI:  Pt is a 89 y.o. female seen today for medical management of chronic diseases.     Hx of skin rash/Eczema, resolved on oral prednisone, topical Triamcinolone and Ketoconazole. On Terbinafine x 30 days since 02/19/24 per dermatology.              09/29/22 CXR CHF, 10/01/22 CXR moderate CHF, on Furosemide daily, 10/06/22 Echo EF 75%, Bun/creat 20/1.0 03/11/24             Gait  abnormality, w/c for mobility, risk for falling             Frequent fall: 2//2 lack of safety awareness, increased frailty.  Needs close supervision and assistance              Dementia, lack of safety awareness,  resides in SNF [facility] for safety, care assistance, failed discontinuation of  Donepezil               Her mood is stabilizing, but still anxious at times, on Escitalopram , Depakote, prn Lorazepam              Hypothyroidism, on Levothyroxine , TSH 3.06 05/27/24                  Past Medical History:  Diagnosis Date   Eczema    Hypothyroid    Hypothyroidism    Hypothyroidism    Vitamin D  deficiency    Past Surgical History:  Procedure Laterality Date   CHOLECYSTECTOMY N/A 06/24/2013   Procedure: LAPAROSCOPIC CHOLECYSTECTOMY WITH INTRAOPERATIVE CHOLANGIOGRAM;  Surgeon: Lynda Leos, MD;  Location: [facility] OR;  Service: General;  Laterality: N/A;   TONSILLECTOMY      Allergies[1]  Outpatient Encounter Medications as of 09/08/2024  Medication Sig   acetaminophen  (TYLENOL ) 650 MG CR tablet Take 650 mg by mouth every 4 (four) hours as needed for pain or fever.   aspirin  EC 81 MG tablet Take 81 mg by mouth  daily. Swallow whole.   calcium -vitamin D  (OSCAL WITH D) 500-200 MG-UNIT tablet Take 1 tablet by mouth 2 (two) times daily.   camphor-menthol (SARNA) lotion Apply 1 Application topically as directed. Apply to neck, abdomen, BLE/BUE topically two times   divalproex (DEPAKOTE) 125 MG DR tablet Take 125 mg by mouth 2 (two) times daily. (Patient taking differently: Take 125 mg by mouth every 12 (twelve) hours. Give 125 mg by mouth every 12 hours related to ANXIETY DISORDER, UNSPECIFIED)   donepezil  (ARICEPT ) 5 MG tablet Take 5 mg by mouth at bedtime.   Emollient (AQUAPHOR OINTMENT BODY EX) Apply to Back, Chest, BLE, BUE topically one time a day for Dry Skin;Itching;Rash related to DERMATITIS, UNSPECIFIED (L30.9) Apply Aquaphor Ointment to rashes on back chest BUE &BLE daily    escitalopram  (LEXAPRO ) 20 MG tablet Take 20 mg by mouth daily.   furosemide (LASIX) 20 MG tablet Take 20 mg by mouth daily.   lactose free nutrition (BOOST) LIQD Take by mouth daily. nourishment give 1 bottle with 1 cup vanilla ice cream at 10 am   levothyroxine  (SYNTHROID ) 100 MCG tablet Take 100 mcg by mouth. one time a day every Sat, Sun   levothyroxine  (SYNTHROID ) 88 MCG tablet Take 88 mcg by mouth daily. Mon, Tue, Wed, Thu, Fr   loratadine (CLARITIN) 10 MG tablet Take 10 mg by mouth daily.   LORazepam  (ATIVAN ) 0.5 MG tablet Take 1 tablet (0.5 mg total) by mouth every 12 (twelve) hours as needed for anxiety. For 15 days   melatonin 1 MG TABS tablet Take 1 mg by mouth at bedtime.   potassium chloride  (MICRO-K ) 10 MEQ CR capsule Take 10 mEq by mouth daily.   Sodium Fluoride (PREVIDENT 5000 BOOSTER PLUS) 1.1 % PSTE Place 1 application  onto teeth at bedtime. for oral care USE A PEA SIZE AMOUNT DURING NIGHTTIME OAL CARE. SPIT OUT ANY EXCESS. DO NOT RINSE. DO NOT EAT/DRINK  FOR 30 MINS AFTER   triamcinolone cream (KENALOG) 0.1 % Apply 1 Application topically 2 (two) times daily.   No facility-administered encounter medications on file as of 09/08/2024.    Review of Systems  Constitutional:  Negative for appetite change, fatigue and fever.  HENT:  Positive for hearing loss. Negative for congestion and trouble swallowing.   Eyes:  Negative for visual disturbance.  Respiratory:  Negative for cough, shortness of breath and wheezing.   Cardiovascular:  Negative for leg swelling.  Gastrointestinal:  Negative for abdominal pain and constipation.  Genitourinary:  Negative for dysuria and urgency.  Musculoskeletal:  Positive for gait problem.  Skin:  Negative for color change.  Neurological:  Negative for speech difficulty and weakness.       Memory lapses.   Psychiatric/Behavioral:  Positive for confusion. Negative for sleep disturbance. The patient is nervous/anxious.     Immunization History   Administered Date(s) Administered   Fluad Quad(high Dose 65+) 07/03/2022, 06/13/2023   INFLUENZA, HIGH DOSE SEASONAL PF 06/13/2023   Influenza Whole 06/15/2018   Influenza,inj,Quad PF,6+ Mos 06/25/2013   Influenza-Unspecified 06/23/2020, 06/30/2021, 06/17/2024   Moderna Covid-19 Vaccine Bivalent Booster 64yrs & up 06/27/2023   Moderna SARS-COV2 Booster Vaccination 06/27/2023   Moderna Sars-Covid-2 Vaccination 09/13/2019, 10/11/2019, 07/20/2020, 02/08/2021   PPD Test 08/16/2023   Pfizer Covid-19 Vaccine Bivalent Booster 39yrs & up 05/31/2021, 04/19/2022, 07/13/2022   Pfizer(Comirnaty)Fall Seasonal Vaccine 12 years and older 07/07/2024   Pneumococcal Conjugate-13 03/11/2014   Pneumococcal Polysaccharide-23 09/11/1986, 09/11/2001, 06/25/2013   RSV,unspecified 10/29/2022   Tdap 03/16/2011,  12/22/2021   Unspecified SARS-COV-2 Vaccination 05/31/2021   Zoster Recombinant(Shingrix) 09/12/2007, 09/02/2022   Zoster, Unspecified 11/21/2022   Pertinent  Health Maintenance Due  Topic Date Due   Influenza Vaccine  Completed   Bone Density Scan  Completed      01/28/2022    3:26 PM 02/15/2022   11:35 AM 04/07/2022   10:29 AM 08/10/2022    9:16 AM 09/19/2022   11:39 AM  Fall Risk  Falls in the past year?  1 1  0  Was there an injury with Fall?  1  1   0   Fall Risk Category Calculator  3 3  0  Fall Risk Category (Retired)  [employer]   Low   (RETIRED) Patient Fall Risk Level High fall risk  High fall risk  High fall risk  High fall risk  Low fall risk   Patient at Risk for Falls Due to  History of fall(s) History of fall(s)  No Fall Risks  Fall risk Follow up  Falls evaluation completed  Falls evaluation completed;Education provided   Falls evaluation completed      Data saved with a previous flowsheet row definition   Functional Status Survey:    Vitals:   09/08/24 1352  BP: (!) 104/53  Pulse: 62  Resp: 19  Temp: (!) 97.2 F (36.2 C)  SpO2: 95%  Weight: 152 lb 6.4 oz (69.1 kg)   Height: 5' (1.524 m)   Body mass index is 29.76 kg/m. Physical Exam Vitals and nursing note reviewed.  Constitutional:      Appearance: Normal appearance.  HENT:     Head: Normocephalic and atraumatic.     Nose: Nose normal.     Mouth/Throat:     Mouth: Mucous membranes are moist.  Eyes:     Extraocular Movements: Extraocular movements intact.     Conjunctiva/sclera: Conjunctivae normal.     Pupils: Pupils are equal, round, and reactive to light.  Cardiovascular:     Rate and Rhythm: Normal rate and regular rhythm.     Heart sounds: No murmur heard. Pulmonary:     Effort: Pulmonary effort is normal.     Breath sounds: No wheezing or rales.  Abdominal:     General: Bowel sounds are normal.     Palpations: Abdomen is soft.     Tenderness: There is no abdominal tenderness.     Hernia: A hernia is present.     Comments: Umbilical hernia.   Musculoskeletal:     Cervical back: Normal range of motion and neck supple.     Right lower leg: No edema.     Left lower leg: No edema.  Skin:    General: Skin is warm and dry.  Neurological:     General: No focal deficit present.     Mental Status: She is alert. Mental status is at baseline.     Gait: Gait abnormal.     Comments: Oriented to person  Psychiatric:        Mood and Affect: Mood normal.        Behavior: Behavior normal.     Labs reviewed: Recent Labs    09/28/23 0000 11/01/23 0000 03/11/24 0000  NA 140 141 140  K 4.2 4.4 4.2  CL 105 106 105  CO2 25* 27* 29*  BUN 17 11 20   CREATININE 1.0 0.8 1.0  CALCIUM  9.2 9.0 9.0   Recent Labs    09/28/23 0000 03/11/24 0000 04/01/24 0000  AST 22 18 18   ALT 14 23 15   ALKPHOS 109 65 61  ALBUMIN 3.2* 3.4* 3.3*   Recent Labs    09/11/23 0000 09/28/23 0000 10/02/23 0000 03/11/24 0000  WBC 6.7 25.9 8.9 7.7  NEUTROABS  --  21,471.00 4,788.00  --   HGB 13.4 14.8  --  14.8  HCT 40 43 45 46  PLT 136* 209 228 172   Lab Results  Component Value Date   TSH 3.06  05/27/2024   Lab Results  Component Value Date   HGBA1C 5.7 06/18/2018   Lab Results  Component Value Date   CHOL 188 01/30/2018   HDL 64 01/30/2018   LDLCALC 106 (H) 01/30/2018   TRIG 89 01/30/2018   CHOLHDL 2.9 01/30/2018    Significant Diagnostic Results in last 30 days:  No results found.  Assessment/Plan No problem-specific Assessment & Plan notes found for this encounter.     Family/ staff Communication: Plan of care reviewed with the patient and charge nurse  Labs/tests ordered: None        [1] No Known Allergies  "

## 2024-09-08 NOTE — Assessment & Plan Note (Signed)
 lack of safety awareness,  resides in SNF [facility] for safety, care assistance, failed discontinuation of  Donepezil

## 2024-09-12 ENCOUNTER — Encounter: Payer: Self-pay | Admitting: Nurse Practitioner

## 2024-09-12 NOTE — Assessment & Plan Note (Signed)
 Bp runs low, only takes Furosemide.

## 2024-09-16 ENCOUNTER — Non-Acute Institutional Stay (SKILLED_NURSING_FACILITY): Admitting: Family Medicine

## 2024-09-16 DIAGNOSIS — F0390 Unspecified dementia without behavioral disturbance: Secondary | ICD-10-CM

## 2024-09-16 DIAGNOSIS — E039 Hypothyroidism, unspecified: Secondary | ICD-10-CM | POA: Diagnosis not present

## 2024-09-16 DIAGNOSIS — I509 Heart failure, unspecified: Secondary | ICD-10-CM | POA: Diagnosis not present

## 2024-09-16 NOTE — Assessment & Plan Note (Addendum)
 TSH in therapeutic range on levothyroxine 

## 2024-09-16 NOTE — Assessment & Plan Note (Addendum)
 EF is normal.  Lungs are clear today.  She continues to take as needed furosemide

## 2024-09-16 NOTE — Progress Notes (Signed)
 " Provider:  Garnette Pinal, MD Location:      Place of Service:     PCP: Sherlynn Madden, MD Patient Care Team: Sherlynn Madden, MD as PCP - General (Internal Medicine) Mast, Man X, NP as Nurse Practitioner (Internal Medicine)  Extended Emergency Contact Information Primary Emergency Contact: Robledo,[name] Address: [address]          [facility] [place], [place] [address] [place] Home Phone: [phone] Mobile Phone: [phone] Relation: Son Secondary Emergency Contact: Morning,Brett Address: [address]          [place], [place] [address] [place] Home Phone: [phone] Mobile Phone: [phone] Relation: Son  Code Status:  Goals of Care: Advanced Directive information    09/08/2024    2:01 PM  Advanced Directives  Does Patient Have a Medical Advance Directive? Yes  Type of Advance Directive Living will;Out of facility DNR (pink MOST or yellow form);Healthcare Power of Attorney  Does patient want to make changes to medical advance directive? No - Patient declined  Copy of Healthcare Power of Attorney in Chart? Yes - validated most recent copy scanned in chart (See row information)  Pre-existing out of facility DNR order (yellow form or pink MOST form) Pink MOST form placed in chart (order not valid for inpatient use);Yellow form placed in chart (order not valid for inpatient use)     HPI: Patient is a 89 y.o. female seen today for medical management of chronic problems including: Congestive heart failure, major neurocognitive disorder, gait disturbance, hypothyroidism, Encountered patient in the living room lying on the sofa taking a nap.  She was able to ambulate answer simple questions and denied any pains or other complaints today.  She continues to take Aricept  and Depakote for dementia.  Also takes levothyroxine  for hypothyroidism. Nursing reports no problems.  She does ambulate via wheelchair.  Past Medical History:   Diagnosis Date   Eczema    Hypothyroid    Hypothyroidism    Hypothyroidism    Vitamin D  deficiency    Past Surgical History:  Procedure Laterality Date   CHOLECYSTECTOMY N/A 06/24/2013   Procedure: LAPAROSCOPIC CHOLECYSTECTOMY WITH INTRAOPERATIVE CHOLANGIOGRAM;  Surgeon: Lynda Leos, MD;  Location: [facility] OR;  Service: General;  Laterality: N/A;   TONSILLECTOMY      reports that she has never smoked. She has never used smokeless tobacco. She reports that she does not drink alcohol and does not use drugs. Social History   Socioeconomic History   Marital status: Widowed    Spouse name: Not on file   Number of children: 3   Years of education: College   Highest education level: Not on file  Occupational History   Occupation: Retired  Tobacco Use   Smoking status: Never   Smokeless tobacco: Never  Vaping Use   Vaping status: Never Used  Substance and Sexual Activity   Alcohol use: No    Alcohol/week: 0.0 standard drinks of alcohol   Drug use: No   Sexual activity: Not on file  Other Topics Concern   Not on file  Social History Narrative   Tobacco use, amount per day now: None      Past tobacco use, amount per day: None      How many years did you use tobacco: Never      Alcohol use (drinks per week): None      Diet: 2 meals a day at [facility]      Do you drink/eat things with  caffeine? Coffee      Marital status: Widowed             What year were you married? 1952      Do you live in a house, apartment, assisted living, condo, trailer? Apartment      Is it one or more stories? 1      How many persons live in your home?      Do you have any pets in your home? 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 Yes           How often? Weekly and daily classes. Does walking on her own.      Do you have a living will? Yes      Do you have a DNR form?   Yes         If not, do you want to discuss one?      Do you  have signed POA/HPOA forms? Yes              Social Drivers of Health   Tobacco Use: Low Risk (09/12/2024)   Patient History    Smoking Tobacco Use: Never    Smokeless Tobacco Use: Never    Passive Exposure: Not on file  Financial Resource Strain: Not on file  Food Insecurity: Not on file  Transportation Needs: Not on file  Physical Activity: Not on file  Stress: Not on file  Social Connections: Not on file  Intimate Partner Violence: Not on file  Depression (PHQ2-9): Low Risk (07/10/2023)   Depression (PHQ2-9)    PHQ-2 Score: 0  Alcohol Screen: Not on file  Housing: Not on file  Utilities: Not on file  Health Literacy: Not on file    Functional Status Survey:    Family History  Problem Relation Age of Onset   Multiple myeloma Mother    Prostate cancer Father    Heart disease Father     Health Maintenance  Topic Date Due   Medicare Annual Wellness (AWV)  07/08/2024   COVID-19 Vaccine (10 - 2025-26 season) 01/05/2025   DTaP/Tdap/Td (3 - Td or Tdap) 12/23/2031   Pneumococcal Vaccine: 50+ Years  Completed   Influenza Vaccine  Completed   Bone Density Scan  Completed   Zoster Vaccines- Shingrix  Completed   Meningococcal B Vaccine  Aged Out    Allergies[1]  Outpatient Encounter Medications as of 09/16/2024  Medication Sig   acetaminophen  (TYLENOL ) 650 MG CR tablet Take 650 mg by mouth every 4 (four) hours as needed for pain or fever.   aspirin  EC 81 MG tablet Take 81 mg by mouth daily. Swallow whole.   calcium -vitamin D  (OSCAL WITH D) 500-200 MG-UNIT tablet Take 1 tablet by mouth 2 (two) times daily.   camphor-menthol (SARNA) lotion Apply 1 Application topically as directed. Apply to neck, abdomen, BLE/BUE topically two times   divalproex (DEPAKOTE) 125 MG DR tablet Take 125 mg by mouth 2 (two) times daily. (Patient taking differently: Take 125 mg by mouth every 12 (twelve) hours. Give 125 mg by mouth every 12 hours related to ANXIETY DISORDER, UNSPECIFIED)    donepezil  (ARICEPT ) 5 MG tablet Take 5 mg by mouth at bedtime.   Emollient (AQUAPHOR OINTMENT BODY EX) Apply to Back, Chest, BLE, BUE topically one time a day for Dry Skin;Itching;Rash related to DERMATITIS, UNSPECIFIED (L30.9) Apply Aquaphor Ointment to rashes on back chest BUE &  BLE daily   escitalopram  (LEXAPRO ) 20 MG tablet Take 20 mg by mouth daily.   furosemide (LASIX) 20 MG tablet Take 20 mg by mouth daily.   lactose free nutrition (BOOST) LIQD Take by mouth daily. nourishment give 1 bottle with 1 cup vanilla ice cream at 10 am   levothyroxine  (SYNTHROID ) 100 MCG tablet Take 100 mcg by mouth. one time a day every Sat, Sun   levothyroxine  (SYNTHROID ) 88 MCG tablet Take 88 mcg by mouth daily. Mon, Tue, Wed, Thu, Fr   loratadine (CLARITIN) 10 MG tablet Take 10 mg by mouth daily.   LORazepam  (ATIVAN ) 0.5 MG tablet Take 1 tablet (0.5 mg total) by mouth every 12 (twelve) hours as needed for anxiety. For 15 days   melatonin 1 MG TABS tablet Take 1 mg by mouth at bedtime.   potassium chloride  (MICRO-K ) 10 MEQ CR capsule Take 10 mEq by mouth daily.   Sodium Fluoride (PREVIDENT 5000 BOOSTER PLUS) 1.1 % PSTE Place 1 application  onto teeth at bedtime. for oral care USE A PEA SIZE AMOUNT DURING NIGHTTIME OAL CARE. SPIT OUT ANY EXCESS. DO NOT RINSE. DO NOT EAT/DRINK  FOR 30 MINS AFTER   triamcinolone cream (KENALOG) 0.1 % Apply 1 Application topically 2 (two) times daily.   No facility-administered encounter medications on file as of 09/16/2024.    Review of Systems  Unable to perform ROS: Dementia    There were no vitals filed for this visit. There is no height or weight on file to calculate BMI. Physical Exam Vitals and nursing note reviewed.  Constitutional:      Appearance: Normal appearance.  Eyes:     Pupils: Pupils are equal, round, and reactive to light.  Cardiovascular:     Rate and Rhythm: Normal rate and regular rhythm.  Pulmonary:     Effort: Pulmonary effort is normal.     Breath  sounds: Normal breath sounds.  Abdominal:     General: Bowel sounds are normal.     Palpations: Abdomen is soft.  Musculoskeletal:     Right lower leg: No edema.     Left lower leg: No edema.  Neurological:     Mental Status: She is alert.     Comments: Oriented to person  Psychiatric:        Mood and Affect: Mood normal.        Behavior: Behavior normal.     Labs reviewed: Basic Metabolic Panel: Recent Labs    09/28/23 0000 11/01/23 0000 03/11/24 0000  NA 140 141 140  K 4.2 4.4 4.2  CL 105 106 105  CO2 25* 27* 29*  BUN 17 11 20   CREATININE 1.0 0.8 1.0  CALCIUM  9.2 9.0 9.0   Liver Function Tests: Recent Labs    09/28/23 0000 03/11/24 0000 04/01/24 0000  AST 22 18 18   ALT 14 23 15   ALKPHOS 109 65 61  ALBUMIN 3.2* 3.4* 3.3*   No results for input(s): LIPASE, AMYLASE in the last 8760 hours. No results for input(s): AMMONIA in the last 8760 hours. CBC: Recent Labs    09/28/23 0000 10/02/23 0000 03/11/24 0000  WBC 25.9 8.9 7.7  NEUTROABS 21,471.00 4,788.00  --   HGB 14.8  --  14.8  HCT 43 45 46  PLT 209 228 172   Cardiac Enzymes: No results for input(s): CKTOTAL, CKMB, CKMBINDEX, TROPONINI in the last 8760 hours. BNP: Invalid input(s): POCBNP Lab Results  Component Value Date   HGBA1C 5.7 06/18/2018  Lab Results  Component Value Date   TSH 3.06 05/27/2024   Lab Results  Component Value Date   VITAMINB12 352 10/28/2014   No results found for: FOLATE No results found for: IRON, TIBC, FERRITIN  Imaging and Procedures obtained prior to SNF admission: No results found.  Assessment/Plan Assessment & Plan Chronic congestive heart failure, unspecified heart failure type (HCC) EF is normal.  Lungs are clear today.  She continues to take as needed furosemide Acquired hypothyroidism TSH in therapeutic range on levothyroxine  Major neurocognitive disorder (HCC) Last MMSE 10/30 in 2024.  Poor safety awareness  Family/ staff  Communication:   Labs/tests ordered:  .smmsig     [1] No Known Allergies  "

## 2024-09-16 NOTE — Assessment & Plan Note (Addendum)
 Last MMSE 10/30 in 2024.  Poor safety awareness

## 2024-09-26 ENCOUNTER — Other Ambulatory Visit: Payer: Self-pay | Admitting: Nurse Practitioner

## 2024-09-26 MED ORDER — LORAZEPAM 0.5 MG PO TABS: 0.5000 mg | ORAL_TABLET | Freq: Two times a day (BID) | ORAL | 2 refills | Status: AC | PRN

## 2024-10-08 ENCOUNTER — Encounter: Payer: Self-pay | Admitting: Nurse Practitioner

## 2024-10-08 ENCOUNTER — Non-Acute Institutional Stay (SKILLED_NURSING_FACILITY): Payer: Self-pay | Admitting: Nurse Practitioner

## 2024-10-08 DIAGNOSIS — F0390 Unspecified dementia without behavioral disturbance: Secondary | ICD-10-CM | POA: Diagnosis not present

## 2024-10-08 DIAGNOSIS — F339 Major depressive disorder, recurrent, unspecified: Secondary | ICD-10-CM

## 2024-10-08 DIAGNOSIS — E039 Hypothyroidism, unspecified: Secondary | ICD-10-CM

## 2024-10-08 DIAGNOSIS — R269 Unspecified abnormalities of gait and mobility: Secondary | ICD-10-CM | POA: Diagnosis not present

## 2024-10-08 NOTE — Assessment & Plan Note (Signed)
 Her mood is stabilizing, but still anxious at times, on Escitalopram , Depakote, prn Lorazepam 

## 2024-10-08 NOTE — Assessment & Plan Note (Signed)
 on Levothyroxine , TSH 3.06 05/27/24

## 2024-10-08 NOTE — Assessment & Plan Note (Signed)
 Dementia, lack of safety awareness,  resides in SNF [facility] for safety, care assistance, failed discontinuation of  Donepezil 

## 2024-10-08 NOTE — Assessment & Plan Note (Signed)
 Gait abnormality, w/c for mobility, risk for falling             Frequent fall: 2//2 lack of safety awareness, increased frailty.  Needs close supervision and assistance

## 2024-10-08 NOTE — Assessment & Plan Note (Signed)
 09/29/22 CXR CHF, 10/01/22 CXR moderate CHF, on Furosemide daily, 10/06/22 Echo EF 75%, Bun/creat 20/1.0 03/11/24 Compensated clinically.

## 2024-10-08 NOTE — Progress Notes (Unsigned)
 " Location:  [facility] Room Number: N002-B Place of Service:  SNF [phone]) Provider: Zuzanna Maroney Lorenda Gaye Scorza N.P.  Patient Care Team: Sherlynn Madden, MD as PCP - General (Internal Medicine) Dima Mini X, NP as Nurse Practitioner (Internal Medicine)  Extended Emergency Contact Information Primary Emergency Contact: Nop,[name] Address: [address]          [facility] [place], [place] [address] [place] Home Phone: [phone] Mobile Phone: [phone] Relation: Son Secondary Emergency Contact: Rickenbach,Brett Address: [address]          [place], [place] [address] [place] Home Phone: [phone] Mobile Phone: [phone] Relation: Son  Code Status:  DNR Goals of care: Advanced Directive information    09/08/2024    2:01 PM  Advanced Directives  Does Patient Have a Medical Advance Directive? Yes  Type of Advance Directive Living will;Out of facility DNR (pink MOST or yellow form);Healthcare Power of Attorney  Does patient want to make changes to medical advance directive? No - Patient declined  Copy of Healthcare Power of Attorney in Chart? Yes - validated most recent copy scanned in chart (See row information)  Pre-existing out of facility DNR order (yellow form or pink MOST form) Pink MOST form placed in chart (order not valid for inpatient use);Yellow form placed in chart (order not valid for inpatient use)     Chief Complaint  Patient presents with   Medication management of chronic issues    Routine    HPI:  Pt is a 89 y.o. female seen today for medical management of chronic diseases.     Hx of skin rash/Eczema, resolved on oral prednisone, topical Triamcinolone and Ketoconazole. On Terbinafine x 30 days since 02/19/24 per dermatology.              09/29/22 CXR CHF, 10/01/22 CXR moderate CHF, on Furosemide daily, 10/06/22 Echo EF 75%, Bun/creat 20/1.0 03/11/24             Gait abnormality, w/c for mobility, risk for falling              Frequent fall: 2//2 lack of safety awareness, increased frailty.  Needs close supervision and assistance              Dementia, lack of safety awareness,  resides in SNF [facility] for safety, care assistance, failed discontinuation of  Donepezil               Her mood is stabilizing, but still anxious at times, on Escitalopram , Depakote, prn Lorazepam              Hypothyroidism, on Levothyroxine , TSH 3.06 05/27/24     Past Medical History:  Diagnosis Date   Eczema    Hypothyroid    Hypothyroidism    Hypothyroidism    Vitamin D  deficiency    Past Surgical History:  Procedure Laterality Date   CHOLECYSTECTOMY N/A 06/24/2013   Procedure: LAPAROSCOPIC CHOLECYSTECTOMY WITH INTRAOPERATIVE CHOLANGIOGRAM;  Surgeon: Lynda Leos, MD;  Location: [facility] OR;  Service: General;  Laterality: N/A;   TONSILLECTOMY      Allergies[1]  Outpatient Encounter Medications as of 10/08/2024  Medication Sig   acetaminophen  (TYLENOL ) 650 MG CR tablet Take 650 mg by mouth every 4 (four) hours as needed for pain or fever.   aspirin  EC 81 MG tablet Take 81 mg by mouth daily. Swallow whole.   calcium -vitamin D  (OSCAL WITH D) 500-200 MG-UNIT tablet Take 1 tablet by mouth 2 (two) times daily.  camphor-menthol (SARNA) lotion Apply 1 Application topically as directed. Apply to neck, abdomen, BLE/BUE topically two times   divalproex (DEPAKOTE) 125 MG DR tablet Take 125 mg by mouth 2 (two) times daily.   donepezil  (ARICEPT ) 5 MG tablet Take 5 mg by mouth at bedtime.   Emollient (AQUAPHOR OINTMENT BODY EX) Apply to Back, Chest, BLE, BUE topically one time a day for Dry Skin;Itching;Rash related to DERMATITIS, UNSPECIFIED (L30.9) Apply Aquaphor Ointment to rashes on back chest BUE &BLE daily   escitalopram  (LEXAPRO ) 20 MG tablet Take 20 mg by mouth daily.   furosemide (LASIX) 20 MG tablet Take 20 mg by mouth daily. (Patient taking differently: Take 20 mg by mouth daily. Give 0.5 tablet by mouth one time  daily)   lactose free nutrition (BOOST) LIQD Take by mouth daily. nourishment give 1 bottle with 1 cup vanilla ice cream at 10 am   levothyroxine  (SYNTHROID ) 100 MCG tablet Take 100 mcg by mouth. one time a day every Sat, Sun   levothyroxine  (SYNTHROID ) 88 MCG tablet Take 88 mcg by mouth daily. Mon, Tue, Wed, Thu, Fr   loratadine (CLARITIN) 10 MG tablet Take 10 mg by mouth daily.   LORazepam  (ATIVAN ) 0.5 MG tablet Take 1 tablet (0.5 mg total) by mouth every 12 (twelve) hours as needed for anxiety. For 15 days   melatonin 1 MG TABS tablet Take 1 mg by mouth at bedtime.   potassium chloride  (MICRO-K ) 10 MEQ CR capsule Take 10 mEq by mouth daily.   Sodium Fluoride (PREVIDENT 5000 BOOSTER PLUS) 1.1 % PSTE Place 1 application  onto teeth at bedtime. for oral care USE A PEA SIZE AMOUNT DURING NIGHTTIME OAL CARE. SPIT OUT ANY EXCESS. DO NOT RINSE. DO NOT EAT/DRINK  FOR 30 MINS AFTER   triamcinolone cream (KENALOG) 0.1 % Apply 1 Application topically 2 (two) times daily. (Patient not taking: Reported on 10/08/2024)   No facility-administered encounter medications on file as of 10/08/2024.    Review of Systems  Unable to perform ROS: Dementia    Immunization History  Administered Date(s) Administered   Fluad Quad(high Dose 65+) 07/03/2022, 06/13/2023   INFLUENZA, HIGH DOSE SEASONAL PF 06/13/2023   Influenza Whole 06/15/2018   Influenza,inj,Quad PF,6+ Mos 06/25/2013   Influenza-Unspecified 06/23/2020, 06/30/2021, 06/17/2024   Moderna Covid-19 Vaccine Bivalent Booster 60yrs & up 06/27/2023   Moderna SARS-COV2 Booster Vaccination 06/27/2023   Moderna Sars-Covid-2 Vaccination 09/13/2019, 10/11/2019, 07/20/2020, 02/08/2021   PPD Test 08/16/2023   Pfizer Covid-19 Vaccine Bivalent Booster 55yrs & up 05/31/2021, 04/19/2022, 07/13/2022   Pfizer(Comirnaty)Fall Seasonal Vaccine 12 years and older 07/07/2024   Pneumococcal Conjugate-13 03/11/2014   Pneumococcal Polysaccharide-23  09/11/1986, 09/11/2001, 06/25/2013   RSV,unspecified 10/29/2022   Tdap 03/16/2011, 12/22/2021   Unspecified SARS-COV-2 Vaccination 05/31/2021   Zoster Recombinant(Shingrix) 09/12/2007, 09/02/2022   Zoster, Unspecified 11/21/2022   Pertinent  Health Maintenance Due  Topic Date Due   Influenza Vaccine  Completed   Bone Density Scan  Completed      01/28/2022    3:26 PM 02/15/2022   11:35 AM 04/07/2022   10:29 AM 08/10/2022    9:16 AM 09/19/2022   11:39 AM  Fall Risk  Falls in the past year?  1 1  0  Was there an injury with Fall?  1  1   0   Fall Risk Category Calculator  3 3  0  Fall Risk Category (Retired)  High  High   Low   (RETIRED) Patient Fall Risk Level High fall  risk  High fall risk  High fall risk  High fall risk  Low fall risk   Patient at Risk for Falls Due to  History of fall(s) History of fall(s)  No Fall Risks  Fall risk Follow up  Falls evaluation completed  Falls evaluation completed;Education provided   Falls evaluation completed      Data saved with a previous flowsheet row definition   Functional Status Survey:    Vitals:   10/08/24 1130  BP: 110/63  Pulse: (!) 59  Temp: (!) 97 F (36.1 C)  SpO2: 96%  Weight: 155 lb (70.3 kg)  Height: 5' (1.524 m)   Body mass index is 30.27 kg/m. Physical Exam Vitals and nursing note reviewed.  Constitutional:      Appearance: Normal appearance.  HENT:     Head: Normocephalic and atraumatic.     Nose: Nose normal.     Mouth/Throat:     Mouth: Mucous membranes are moist.  Eyes:     Extraocular Movements: Extraocular movements intact.     Conjunctiva/sclera: Conjunctivae normal.     Pupils: Pupils are equal, round, and reactive to light.  Cardiovascular:     Rate and Rhythm: Normal rate and regular rhythm.     Heart sounds: No murmur heard. Pulmonary:     Effort: Pulmonary effort is normal.     Breath sounds: No wheezing or rales.  Abdominal:     General: Bowel sounds are normal.     Palpations:  Abdomen is soft.     Tenderness: There is no abdominal tenderness.     Hernia: A hernia is present.     Comments: Umbilical hernia.   Musculoskeletal:     Cervical back: Normal range of motion and neck supple.     Right lower leg: No edema.     Left lower leg: No edema.  Skin:    General: Skin is warm and dry.  Neurological:     General: No focal deficit present.     Mental Status: She is alert. Mental status is at baseline.     Gait: Gait abnormal.     Comments: Oriented to person  Psychiatric:        Mood and Affect: Mood normal.        Behavior: Behavior normal.     Labs reviewed: Recent Labs    11/01/23 0000 03/11/24 0000  NA 141 140  K 4.4 4.2  CL 106 105  CO2 27* 29*  BUN 11 20  CREATININE 0.8 1.0  CALCIUM  9.0 9.0   Recent Labs    03/11/24 0000 04/01/24 0000  AST 18 18  ALT 23 15  ALKPHOS 65 61  ALBUMIN 3.4* 3.3*   Recent Labs    03/11/24 0000  WBC 7.7  HGB 14.8  HCT 46  PLT 172   Lab Results  Component Value Date   TSH 3.06 05/27/2024   Lab Results  Component Value Date   HGBA1C 5.7 06/18/2018   Lab Results  Component Value Date   CHOL 188 01/30/2018   HDL 64 01/30/2018   LDLCALC 106 (H) 01/30/2018   TRIG 89 01/30/2018   CHOLHDL 2.9 01/30/2018    Significant Diagnostic Results in last 30 days:  No results found.  Assessment/Plan Hypothyroidism  on Levothyroxine , TSH 3.06 05/27/24  Depression, recurrent  Her mood is stabilizing, but still anxious at times, on Escitalopram , Depakote, prn Lorazepam   Major neurocognitive disorder (HCC) Dementia, lack of safety awareness,  resides in  SNF [facility] for safety, care assistance, failed discontinuation of  Donepezil    Gait abnormality  Gait abnormality, w/c for mobility, risk for falling             Frequent fall: 2//2 lack of safety awareness, increased frailty.  Needs close supervision and assistance      Family/ staff Communication: plan of care reviewed with the patient and charge  nurse.   Labs/tests ordered:  none          [1] No Known Allergies "

## 2024-10-09 ENCOUNTER — Encounter: Payer: Self-pay | Admitting: Nurse Practitioner
